# Patient Record
Sex: Male | Born: 1945 | Race: White | Hispanic: No | Marital: Single | Smoking: Former smoker
Health system: Southern US, Community
[De-identification: ages and names within clinical notes are randomized; demographics above are authoritative.]

## PROBLEM LIST (undated history)

## (undated) DIAGNOSIS — R351 Nocturia: Secondary | ICD-10-CM

## (undated) DIAGNOSIS — M545 Low back pain: Principal | ICD-10-CM

## (undated) DIAGNOSIS — Z5189 Encounter for other specified aftercare: Secondary | ICD-10-CM

## (undated) DIAGNOSIS — E785 Hyperlipidemia, unspecified: Secondary | ICD-10-CM

## (undated) DIAGNOSIS — F419 Anxiety disorder, unspecified: Secondary | ICD-10-CM

## (undated) DIAGNOSIS — F319 Bipolar disorder, unspecified: Secondary | ICD-10-CM

## (undated) DIAGNOSIS — I10 Essential (primary) hypertension: Secondary | ICD-10-CM

## (undated) DIAGNOSIS — Z889 Allergy status to unspecified drugs, medicaments and biological substances status: Secondary | ICD-10-CM

## (undated) DIAGNOSIS — K219 Gastro-esophageal reflux disease without esophagitis: Secondary | ICD-10-CM

## (undated) DIAGNOSIS — Z972 Presence of dental prosthetic device (complete) (partial): Secondary | ICD-10-CM

## (undated) DIAGNOSIS — Z8619 Personal history of other infectious and parasitic diseases: Secondary | ICD-10-CM

## (undated) DIAGNOSIS — R413 Other amnesia: Secondary | ICD-10-CM

## (undated) DIAGNOSIS — G4700 Insomnia, unspecified: Secondary | ICD-10-CM

## (undated) DIAGNOSIS — F32A Depression, unspecified: Secondary | ICD-10-CM

## (undated) DIAGNOSIS — G8929 Other chronic pain: Secondary | ICD-10-CM

## (undated) DIAGNOSIS — C61 Malignant neoplasm of prostate: Secondary | ICD-10-CM

## (undated) DIAGNOSIS — I499 Cardiac arrhythmia, unspecified: Secondary | ICD-10-CM

## (undated) DIAGNOSIS — M2550 Pain in unspecified joint: Secondary | ICD-10-CM

## (undated) DIAGNOSIS — I714 Abdominal aortic aneurysm, without rupture, unspecified: Secondary | ICD-10-CM

## (undated) DIAGNOSIS — M549 Dorsalgia, unspecified: Secondary | ICD-10-CM

## (undated) DIAGNOSIS — Z973 Presence of spectacles and contact lenses: Secondary | ICD-10-CM

## (undated) DIAGNOSIS — I509 Heart failure, unspecified: Secondary | ICD-10-CM

## (undated) DIAGNOSIS — M79606 Pain in leg, unspecified: Secondary | ICD-10-CM

## (undated) DIAGNOSIS — N400 Enlarged prostate without lower urinary tract symptoms: Secondary | ICD-10-CM

## (undated) DIAGNOSIS — R600 Localized edema: Secondary | ICD-10-CM

## (undated) DIAGNOSIS — M1990 Unspecified osteoarthritis, unspecified site: Secondary | ICD-10-CM

## (undated) DIAGNOSIS — I739 Peripheral vascular disease, unspecified: Secondary | ICD-10-CM

## (undated) DIAGNOSIS — IMO0001 Reserved for inherently not codable concepts without codable children: Secondary | ICD-10-CM

## (undated) DIAGNOSIS — F329 Major depressive disorder, single episode, unspecified: Secondary | ICD-10-CM

## (undated) DIAGNOSIS — R3915 Urgency of urination: Secondary | ICD-10-CM

## (undated) DIAGNOSIS — K08109 Complete loss of teeth, unspecified cause, unspecified class: Secondary | ICD-10-CM

## (undated) DIAGNOSIS — R609 Edema, unspecified: Secondary | ICD-10-CM

## (undated) DIAGNOSIS — F988 Other specified behavioral and emotional disorders with onset usually occurring in childhood and adolescence: Secondary | ICD-10-CM

## (undated) DIAGNOSIS — R011 Cardiac murmur, unspecified: Secondary | ICD-10-CM

## (undated) DIAGNOSIS — K635 Polyp of colon: Secondary | ICD-10-CM

## (undated) HISTORY — PX: TONSILLECTOMY: SUR1361

## (undated) HISTORY — DX: Abdominal aortic aneurysm, without rupture: I71.4

## (undated) HISTORY — DX: Abdominal aortic aneurysm, without rupture, unspecified: I71.40

## (undated) HISTORY — PX: FOOT SURGERY: SHX648

## (undated) HISTORY — PX: TESTICLE SURGERY: SHX794

## (undated) HISTORY — DX: Personal history of other infectious and parasitic diseases: Z86.19

## (undated) HISTORY — DX: Heart failure, unspecified: I50.9

## (undated) HISTORY — PX: CARDIAC CATHETERIZATION: SHX172

## (undated) HISTORY — PX: COLONOSCOPY: SHX174

## (undated) HISTORY — PX: TURP VAPORIZATION: SUR1397

## (undated) HISTORY — DX: Pain in unspecified joint: M25.50

## (undated) HISTORY — DX: Other chronic pain: G89.29

## (undated) HISTORY — PX: VASCULAR SURGERY: SHX849

## (undated) HISTORY — DX: Other specified behavioral and emotional disorders with onset usually occurring in childhood and adolescence: F98.8

## (undated) HISTORY — PX: BACK SURGERY: SHX140

## (undated) HISTORY — DX: Unspecified osteoarthritis, unspecified site: M19.90

## (undated) HISTORY — PX: SHOULDER SURGERY: SHX246

## (undated) HISTORY — DX: Anxiety disorder, unspecified: F41.9

## (undated) HISTORY — DX: Cardiac murmur, unspecified: R01.1

## (undated) HISTORY — DX: Major depressive disorder, single episode, unspecified: F32.9

## (undated) HISTORY — PX: CHOLECYSTECTOMY: SHX55

## (undated) HISTORY — DX: Depression, unspecified: F32.A

## (undated) HISTORY — DX: Pain in leg, unspecified: M79.606

## (undated) HISTORY — DX: Hyperlipidemia, unspecified: E78.5

## (undated) HISTORY — DX: Essential (primary) hypertension: I10

## (undated) HISTORY — DX: Low back pain: M54.5

---

## 2003-10-27 ENCOUNTER — Ambulatory Visit (HOSPITAL_BASED_OUTPATIENT_CLINIC_OR_DEPARTMENT_OTHER): Admission: RE | Admit: 2003-10-27 | Discharge: 2003-10-27 | Payer: Self-pay | Admitting: Orthopaedic Surgery

## 2003-10-27 ENCOUNTER — Ambulatory Visit (HOSPITAL_COMMUNITY): Admission: RE | Admit: 2003-10-27 | Discharge: 2003-10-27 | Payer: Self-pay | Admitting: Orthopaedic Surgery

## 2004-04-25 ENCOUNTER — Emergency Department (HOSPITAL_COMMUNITY): Admission: EM | Admit: 2004-04-25 | Discharge: 2004-04-25 | Payer: Self-pay | Admitting: Emergency Medicine

## 2005-03-03 ENCOUNTER — Ambulatory Visit (HOSPITAL_COMMUNITY): Admission: RE | Admit: 2005-03-03 | Discharge: 2005-03-03 | Payer: Self-pay | Admitting: Orthopedic Surgery

## 2005-09-08 ENCOUNTER — Ambulatory Visit (HOSPITAL_COMMUNITY): Admission: RE | Admit: 2005-09-08 | Discharge: 2005-09-08 | Payer: Self-pay | Admitting: Orthopaedic Surgery

## 2005-10-17 ENCOUNTER — Inpatient Hospital Stay (HOSPITAL_COMMUNITY): Admission: RE | Admit: 2005-10-17 | Discharge: 2005-10-21 | Payer: Self-pay | Admitting: Orthopedic Surgery

## 2006-09-08 ENCOUNTER — Encounter: Admission: RE | Admit: 2006-09-08 | Discharge: 2006-09-08 | Payer: Self-pay | Admitting: Orthopedic Surgery

## 2006-10-24 ENCOUNTER — Ambulatory Visit: Payer: Self-pay | Admitting: Vascular Surgery

## 2006-11-26 ENCOUNTER — Encounter
Admission: RE | Admit: 2006-11-26 | Discharge: 2007-02-24 | Payer: Self-pay | Admitting: Physical Medicine & Rehabilitation

## 2006-11-27 ENCOUNTER — Ambulatory Visit: Payer: Self-pay | Admitting: Physical Medicine & Rehabilitation

## 2006-12-31 ENCOUNTER — Ambulatory Visit: Payer: Self-pay | Admitting: Physical Medicine & Rehabilitation

## 2007-01-25 ENCOUNTER — Ambulatory Visit: Payer: Self-pay | Admitting: Physical Medicine & Rehabilitation

## 2007-02-22 ENCOUNTER — Encounter
Admission: RE | Admit: 2007-02-22 | Discharge: 2007-02-25 | Payer: Self-pay | Admitting: Physical Medicine & Rehabilitation

## 2007-03-14 HISTORY — PX: SPINAL CORD STIMULATOR IMPLANT: SHX2422

## 2007-06-17 ENCOUNTER — Ambulatory Visit: Payer: Self-pay | Admitting: Vascular Surgery

## 2007-08-26 ENCOUNTER — Encounter
Admission: RE | Admit: 2007-08-26 | Discharge: 2007-11-20 | Payer: Self-pay | Admitting: Physical Medicine & Rehabilitation

## 2007-08-27 ENCOUNTER — Ambulatory Visit: Payer: Self-pay | Admitting: Physical Medicine & Rehabilitation

## 2007-09-23 ENCOUNTER — Ambulatory Visit: Payer: Self-pay | Admitting: Physical Medicine & Rehabilitation

## 2007-10-28 ENCOUNTER — Ambulatory Visit: Payer: Self-pay | Admitting: Physical Medicine & Rehabilitation

## 2007-11-20 ENCOUNTER — Encounter
Admission: RE | Admit: 2007-11-20 | Discharge: 2007-11-21 | Payer: Self-pay | Admitting: Physical Medicine & Rehabilitation

## 2007-11-21 ENCOUNTER — Ambulatory Visit: Payer: Self-pay | Admitting: Physical Medicine & Rehabilitation

## 2007-12-18 ENCOUNTER — Ambulatory Visit: Payer: Self-pay | Admitting: Vascular Surgery

## 2008-01-16 ENCOUNTER — Ambulatory Visit: Payer: Self-pay | Admitting: Physical Medicine & Rehabilitation

## 2008-01-16 ENCOUNTER — Encounter
Admission: RE | Admit: 2008-01-16 | Discharge: 2008-04-15 | Payer: Self-pay | Admitting: Physical Medicine & Rehabilitation

## 2008-03-16 ENCOUNTER — Ambulatory Visit: Payer: Self-pay | Admitting: Physical Medicine & Rehabilitation

## 2008-04-14 ENCOUNTER — Ambulatory Visit: Payer: Self-pay | Admitting: Physical Medicine & Rehabilitation

## 2008-05-07 ENCOUNTER — Encounter
Admission: RE | Admit: 2008-05-07 | Discharge: 2008-08-05 | Payer: Self-pay | Admitting: Physical Medicine & Rehabilitation

## 2008-05-11 ENCOUNTER — Ambulatory Visit: Payer: Self-pay | Admitting: Physical Medicine & Rehabilitation

## 2008-06-15 ENCOUNTER — Ambulatory Visit: Payer: Self-pay | Admitting: Physical Medicine & Rehabilitation

## 2008-07-08 ENCOUNTER — Ambulatory Visit: Payer: Self-pay | Admitting: Vascular Surgery

## 2008-07-14 ENCOUNTER — Ambulatory Visit: Payer: Self-pay | Admitting: Physical Medicine & Rehabilitation

## 2008-12-28 ENCOUNTER — Ambulatory Visit (HOSPITAL_COMMUNITY): Admission: RE | Admit: 2008-12-28 | Discharge: 2008-12-28 | Payer: Self-pay | Admitting: Anesthesiology

## 2009-01-13 ENCOUNTER — Ambulatory Visit (HOSPITAL_COMMUNITY): Admission: RE | Admit: 2009-01-13 | Discharge: 2009-01-13 | Payer: Self-pay | Admitting: Orthopedic Surgery

## 2009-01-26 ENCOUNTER — Ambulatory Visit (HOSPITAL_COMMUNITY): Admission: RE | Admit: 2009-01-26 | Discharge: 2009-01-26 | Payer: Self-pay | Admitting: Orthopedic Surgery

## 2009-06-16 ENCOUNTER — Ambulatory Visit: Payer: Self-pay | Admitting: Vascular Surgery

## 2009-08-10 ENCOUNTER — Encounter: Admission: RE | Admit: 2009-08-10 | Discharge: 2009-08-10 | Payer: Self-pay | Admitting: Anesthesiology

## 2009-08-25 ENCOUNTER — Ambulatory Visit: Payer: Self-pay | Admitting: Vascular Surgery

## 2009-09-08 ENCOUNTER — Ambulatory Visit: Payer: Self-pay | Admitting: Vascular Surgery

## 2009-09-08 ENCOUNTER — Encounter: Admission: RE | Admit: 2009-09-08 | Discharge: 2009-09-08 | Payer: Self-pay | Admitting: Vascular Surgery

## 2009-09-29 ENCOUNTER — Ambulatory Visit: Payer: Self-pay | Admitting: Vascular Surgery

## 2009-10-20 ENCOUNTER — Ambulatory Visit: Payer: Self-pay | Admitting: Vascular Surgery

## 2009-11-11 HISTORY — PX: ABDOMINAL AORTIC ANEURYSM REPAIR: SHX42

## 2009-11-16 ENCOUNTER — Inpatient Hospital Stay (HOSPITAL_COMMUNITY): Admission: RE | Admit: 2009-11-16 | Discharge: 2009-11-17 | Payer: Self-pay | Admitting: Vascular Surgery

## 2009-11-16 ENCOUNTER — Ambulatory Visit: Payer: Self-pay | Admitting: Vascular Surgery

## 2009-11-19 ENCOUNTER — Ambulatory Visit: Payer: Self-pay | Admitting: Vascular Surgery

## 2009-12-08 ENCOUNTER — Encounter: Admission: RE | Admit: 2009-12-08 | Discharge: 2009-12-08 | Payer: Self-pay | Admitting: Vascular Surgery

## 2009-12-09 ENCOUNTER — Ambulatory Visit: Payer: Self-pay | Admitting: Vascular Surgery

## 2009-12-11 HISTORY — PX: SPINE SURGERY: SHX786

## 2009-12-14 ENCOUNTER — Inpatient Hospital Stay (HOSPITAL_COMMUNITY): Admission: RE | Admit: 2009-12-14 | Discharge: 2009-12-24 | Payer: Self-pay | Admitting: Orthopedic Surgery

## 2009-12-14 ENCOUNTER — Encounter (INDEPENDENT_AMBULATORY_CARE_PROVIDER_SITE_OTHER): Payer: Self-pay | Admitting: Orthopedic Surgery

## 2009-12-14 ENCOUNTER — Ambulatory Visit: Payer: Self-pay | Admitting: Pulmonary Disease

## 2010-04-02 ENCOUNTER — Other Ambulatory Visit: Payer: Self-pay | Admitting: Vascular Surgery

## 2010-04-02 DIAGNOSIS — I714 Abdominal aortic aneurysm, without rupture, unspecified: Secondary | ICD-10-CM

## 2010-05-04 DIAGNOSIS — M79606 Pain in leg, unspecified: Secondary | ICD-10-CM

## 2010-05-04 HISTORY — DX: Pain in leg, unspecified: M79.606

## 2010-05-05 ENCOUNTER — Other Ambulatory Visit: Payer: Self-pay

## 2010-05-05 ENCOUNTER — Ambulatory Visit (INDEPENDENT_AMBULATORY_CARE_PROVIDER_SITE_OTHER): Payer: Private Health Insurance - Indemnity | Admitting: Vascular Surgery

## 2010-05-05 ENCOUNTER — Ambulatory Visit
Admission: RE | Admit: 2010-05-05 | Discharge: 2010-05-05 | Disposition: A | Discharge status: Home / Self Care | Payer: Private Health Insurance - Indemnity | Source: Ambulatory Visit | Attending: Vascular Surgery | Admitting: Vascular Surgery

## 2010-05-05 DIAGNOSIS — I714 Abdominal aortic aneurysm, without rupture, unspecified: Secondary | ICD-10-CM

## 2010-05-05 MED ORDER — IOHEXOL 350 MG/ML SOLN
100.0000 mL | Freq: Once | INTRAVENOUS | Status: AC | PRN
  Administered 2010-05-05: 100 mL via INTRAVENOUS

## 2010-05-06 NOTE — Assessment & Plan Note (Signed)
OFFICE VISIT  Michael Lutz DOB:  18-Feb-1946                                       05/05/2010 ZOXWR#:[id number]  HISTORY OF PRESENT ILLNESS:  The patient is a 64 year old male who underwent endovascular stent graft repair of his aneurysm in September of 2011.  He returns today for further followup regarding this.  He denies any real changes in his back pain but states that he has chronic back pain.  He also underwent a back procedure by Dr. Alveda Reasons in October. He also stated today that he has intermittent abdominal pain but denies any nausea or vomiting.  He denies any fever.  He denies any association with any certain types of food whether it be spicy food or greasy food. He says the abdominal pain has been present for a month or so and is intermittent in nature but occurs almost on a daily basis.  REVIEW OF SYSTEMS:  He states that he has had some constipation recently. MUSCULOSKELETAL:  He has multiple joint and arthritis pain. PSYCHIATRIC:  He has mild anxiety and depression.  CHRONIC MEDICAL PROBLEMS:  Other chronic medical problems include hypertension and elevated cholesterol.  These are currently stable and followed by Dr. Eldridge Dace.  MEDICATIONS:  Include ranitidine, Effexor, hydrochlorothiazide, Seroquel, amlodipine, meclizine, quinapril, lamotrigine, Lidoderm patch, Sudafed, aspirin, Klonopin, Norflex, trazodone, nabumetone, ibuprofen, Exalgo ER, Lipitor and he takes several multivitamins.  He also is on OxyContin 80 mg three times a day and oxycodone 15 mg p.r.n.  He also recently was started on furosemide but did not know the dose of this.  ALLERGIES:  He has no known drug allergies.  PHYSICAL EXAM:  Vital signs:  Blood pressure is 121/77 in the left arm, heart rate 84 and regular, respirations 20.  HEENT:  Sclerae are anicteric.  Abdomen:  Has tenderness in the right upper quadrant with some guarding.  There is no mass in the abdomen.   There is no pulsatile mass in the abdomen.  He has 2+ femoral pulses and 2+ dorsalis pedis pulses bilaterally.  The left ankle is swollen which he states has been present since his back operation.  He had a CT scan of the abdomen and pelvis today which shows his stent graft is in good position with no evidence of type 1 proximal or distal endo leak.  However, he did have gallbladder wall thickening with a very distended gallbladder and some possible pericholecystic fluid.  This was suggestive of cholecystitis.  The CT scan was discussed with Dr. Audie Pinto.  His abdominal aortic aneurysm was 3.9 cm in diameter and stable.  In summary, the patient is doing well from his endovascular stent graft repair.  He has chronic swelling of the left lower extremity which has been present since his back operation.  Apparently Dr. Eldridge Dace had ordered a duplex ultrasound of his left lower extremity in the near future.  The patient also has followup scheduled in the near future with Dr. Alveda Reasons regarding this.  As far as his gallbladder is concerned he did have findings on the CT scan suggestive of cholecystitis and has some right upper quadrant pain. In light of this we have scheduled him for an appointment with [facility].  I spoke with our scheduling people today but the patient needed to check his schedule at home to find out an appropriate appointment  time.  He will follow up with them regarding this.  He will follow up with me in 6 months' time for an aortic ultrasound to intermittently check his aneurysm stent graft.    Michael Hora. [name], MD Electronically Signed  CEF/MEDQ  D:  05/05/2010  T:  05/06/2010  Job:  [id number]  cc:   Corky Crafts, MD Nelda Severe, MD [facility]

## 2010-05-11 DIAGNOSIS — Z48812 Encounter for surgical aftercare following surgery on the circulatory system: Secondary | ICD-10-CM

## 2010-05-25 LAB — PREPARE FRESH FROZEN PLASMA

## 2010-05-25 LAB — BASIC METABOLIC PANEL
BUN: 11 mg/dL (ref 6–23)
BUN: 16 mg/dL (ref 6–23)
BUN: 17 mg/dL (ref 6–23)
BUN: 3 mg/dL — ABNORMAL LOW (ref 6–23)
BUN: 4 mg/dL — ABNORMAL LOW (ref 6–23)
BUN: 6 mg/dL (ref 6–23)
BUN: 7 mg/dL (ref 6–23)
BUN: 7 mg/dL (ref 6–23)
BUN: 7 mg/dL (ref 6–23)
BUN: 8 mg/dL (ref 6–23)
BUN: 9 mg/dL (ref 6–23)
CO2: 25 mEq/L (ref 19–32)
CO2: 26 mEq/L (ref 19–32)
CO2: 26 mEq/L (ref 19–32)
CO2: 27 mEq/L (ref 19–32)
CO2: 27 mEq/L (ref 19–32)
CO2: 28 mEq/L (ref 19–32)
CO2: 29 mEq/L (ref 19–32)
CO2: 30 mEq/L (ref 19–32)
CO2: 31 mEq/L (ref 19–32)
CO2: 31 mEq/L (ref 19–32)
CO2: 33 mEq/L — ABNORMAL HIGH (ref 19–32)
Calcium: 7 mg/dL — ABNORMAL LOW (ref 8.4–10.5)
Calcium: 7.1 mg/dL — ABNORMAL LOW (ref 8.4–10.5)
Calcium: 7.2 mg/dL — ABNORMAL LOW (ref 8.4–10.5)
Calcium: 7.4 mg/dL — ABNORMAL LOW (ref 8.4–10.5)
Calcium: 8 mg/dL — ABNORMAL LOW (ref 8.4–10.5)
Calcium: 8.3 mg/dL — ABNORMAL LOW (ref 8.4–10.5)
Calcium: 8.4 mg/dL (ref 8.4–10.5)
Calcium: 8.8 mg/dL (ref 8.4–10.5)
Calcium: 8.8 mg/dL (ref 8.4–10.5)
Calcium: 8.9 mg/dL (ref 8.4–10.5)
Calcium: 9.1 mg/dL (ref 8.4–10.5)
Chloride: 100 mEq/L (ref 96–112)
Chloride: 101 mEq/L (ref 96–112)
Chloride: 102 mEq/L (ref 96–112)
Chloride: 103 mEq/L (ref 96–112)
Chloride: 104 mEq/L (ref 96–112)
Chloride: 105 mEq/L (ref 96–112)
Chloride: 106 mEq/L (ref 96–112)
Chloride: 106 mEq/L (ref 96–112)
Chloride: 107 mEq/L (ref 96–112)
Chloride: 108 mEq/L (ref 96–112)
Chloride: 110 mEq/L (ref 96–112)
Creatinine, Ser: 0.64 mg/dL (ref 0.4–1.5)
Creatinine, Ser: 0.66 mg/dL (ref 0.4–1.5)
Creatinine, Ser: 0.67 mg/dL (ref 0.4–1.5)
Creatinine, Ser: 0.7 mg/dL (ref 0.4–1.5)
Creatinine, Ser: 0.72 mg/dL (ref 0.4–1.5)
Creatinine, Ser: 0.73 mg/dL (ref 0.4–1.5)
Creatinine, Ser: 0.78 mg/dL (ref 0.4–1.5)
Creatinine, Ser: 0.83 mg/dL (ref 0.4–1.5)
Creatinine, Ser: 0.97 mg/dL (ref 0.4–1.5)
Creatinine, Ser: 0.98 mg/dL (ref 0.4–1.5)
Creatinine, Ser: 1.02 mg/dL (ref 0.4–1.5)
GFR calc Af Amer: >60 mL/min (ref >60)
GFR calc Af Amer: >60 mL/min (ref >60)
GFR calc Af Amer: >60 mL/min (ref >60)
GFR calc Af Amer: >60 mL/min (ref >60)
GFR calc Af Amer: >60 mL/min (ref >60)
GFR calc Af Amer: >60 mL/min (ref >60)
GFR calc Af Amer: >60 mL/min (ref >60)
GFR calc Af Amer: >60 mL/min (ref >60)
GFR calc Af Amer: >60 mL/min (ref >60)
GFR calc Af Amer: >60 mL/min (ref >60)
GFR calc Af Amer: >60 mL/min (ref >60)
GFR calc non Af Amer: >60 mL/min (ref >60)
GFR calc non Af Amer: >60 mL/min (ref >60)
GFR calc non Af Amer: >60 mL/min (ref >60)
GFR calc non Af Amer: >60 mL/min (ref >60)
GFR calc non Af Amer: >60 mL/min (ref >60)
GFR calc non Af Amer: >60 mL/min (ref >60)
GFR calc non Af Amer: >60 mL/min (ref >60)
GFR calc non Af Amer: >60 mL/min (ref >60)
GFR calc non Af Amer: >60 mL/min (ref >60)
GFR calc non Af Amer: >60 mL/min (ref >60)
GFR calc non Af Amer: >60 mL/min (ref >60)
Glucose, Bld: 101 mg/dL — ABNORMAL HIGH (ref 70–99)
Glucose, Bld: 103 mg/dL — ABNORMAL HIGH (ref 70–99)
Glucose, Bld: 103 mg/dL — ABNORMAL HIGH (ref 70–99)
Glucose, Bld: 104 mg/dL — ABNORMAL HIGH (ref 70–99)
Glucose, Bld: 106 mg/dL — ABNORMAL HIGH (ref 70–99)
Glucose, Bld: 111 mg/dL — ABNORMAL HIGH (ref 70–99)
Glucose, Bld: 112 mg/dL — ABNORMAL HIGH (ref 70–99)
Glucose, Bld: 112 mg/dL — ABNORMAL HIGH (ref 70–99)
Glucose, Bld: 138 mg/dL — ABNORMAL HIGH (ref 70–99)
Glucose, Bld: 174 mg/dL — ABNORMAL HIGH (ref 70–99)
Glucose, Bld: 95 mg/dL (ref 70–99)
Potassium: 3.1 mEq/L — ABNORMAL LOW (ref 3.5–5.1)
Potassium: 3.4 mEq/L — ABNORMAL LOW (ref 3.5–5.1)
Potassium: 3.5 mEq/L (ref 3.5–5.1)
Potassium: 3.6 mEq/L (ref 3.5–5.1)
Potassium: 3.7 mEq/L (ref 3.5–5.1)
Potassium: 3.8 mEq/L (ref 3.5–5.1)
Potassium: 3.8 mEq/L (ref 3.5–5.1)
Potassium: 3.9 mEq/L (ref 3.5–5.1)
Potassium: 3.9 mEq/L (ref 3.5–5.1)
Potassium: 4.1 mEq/L (ref 3.5–5.1)
Potassium: 5.1 mEq/L (ref 3.5–5.1)
Sodium: 134 mEq/L — ABNORMAL LOW (ref 135–145)
Sodium: 135 mEq/L (ref 135–145)
Sodium: 136 mEq/L (ref 135–145)
Sodium: 136 mEq/L (ref 135–145)
Sodium: 137 mEq/L (ref 135–145)
Sodium: 137 mEq/L (ref 135–145)
Sodium: 138 mEq/L (ref 135–145)
Sodium: 138 mEq/L (ref 135–145)
Sodium: 140 mEq/L (ref 135–145)
Sodium: 140 mEq/L (ref 135–145)
Sodium: 141 mEq/L (ref 135–145)

## 2010-05-25 LAB — POCT I-STAT 4, (NA,K, GLUC, HGB,HCT)
Glucose, Bld: 108 mg/dL — ABNORMAL HIGH (ref 70–99)
Glucose, Bld: 114 mg/dL — ABNORMAL HIGH (ref 70–99)
Glucose, Bld: 130 mg/dL — ABNORMAL HIGH (ref 70–99)
HCT: 31 % — ABNORMAL LOW (ref 39.0–52.0)
HCT: 33 % — ABNORMAL LOW (ref 39.0–52.0)
HCT: 33 % — ABNORMAL LOW (ref 39.0–52.0)
Hemoglobin: 10.5 g/dL — ABNORMAL LOW (ref 13.0–17.0)
Hemoglobin: 11.2 g/dL — ABNORMAL LOW (ref 13.0–17.0)
Hemoglobin: 11.2 g/dL — ABNORMAL LOW (ref 13.0–17.0)
Potassium: 3.9 mEq/L (ref 3.5–5.1)
Potassium: 4 mEq/L (ref 3.5–5.1)
Potassium: 4 mEq/L (ref 3.5–5.1)
Sodium: 135 mEq/L (ref 135–145)
Sodium: 136 mEq/L (ref 135–145)
Sodium: 138 mEq/L (ref 135–145)

## 2010-05-25 LAB — CBC
HCT: 24.1 % — ABNORMAL LOW (ref 39.0–52.0)
HCT: 24.3 % — ABNORMAL LOW (ref 39.0–52.0)
HCT: 24.4 % — ABNORMAL LOW (ref 39.0–52.0)
HCT: 25.1 % — ABNORMAL LOW (ref 39.0–52.0)
HCT: 26.2 % — ABNORMAL LOW (ref 39.0–52.0)
HCT: 27.4 % — ABNORMAL LOW (ref 39.0–52.0)
HCT: 27.4 % — ABNORMAL LOW (ref 39.0–52.0)
HCT: 28.5 % — ABNORMAL LOW (ref 39.0–52.0)
HCT: 29.3 % — ABNORMAL LOW (ref 39.0–52.0)
HCT: 31.3 % — ABNORMAL LOW (ref 39.0–52.0)
HCT: 31.6 % — ABNORMAL LOW (ref 39.0–52.0)
HCT: 33.9 % — ABNORMAL LOW (ref 39.0–52.0)
Hemoglobin: 10 g/dL — ABNORMAL LOW (ref 13.0–17.0)
Hemoglobin: 10.5 g/dL — ABNORMAL LOW (ref 13.0–17.0)
Hemoglobin: 10.6 g/dL — ABNORMAL LOW (ref 13.0–17.0)
Hemoglobin: 11.4 g/dL — ABNORMAL LOW (ref 13.0–17.0)
Hemoglobin: 8.2 g/dL — ABNORMAL LOW (ref 13.0–17.0)
Hemoglobin: 8.3 g/dL — ABNORMAL LOW (ref 13.0–17.0)
Hemoglobin: 8.3 g/dL — ABNORMAL LOW (ref 13.0–17.0)
Hemoglobin: 8.5 g/dL — ABNORMAL LOW (ref 13.0–17.0)
Hemoglobin: 8.9 g/dL — ABNORMAL LOW (ref 13.0–17.0)
Hemoglobin: 9 g/dL — ABNORMAL LOW (ref 13.0–17.0)
Hemoglobin: 9.3 g/dL — ABNORMAL LOW (ref 13.0–17.0)
Hemoglobin: 9.3 g/dL — ABNORMAL LOW (ref 13.0–17.0)
MCH: 29.6 pg (ref 26.0–34.0)
MCH: 29.9 pg (ref 26.0–34.0)
MCH: 30.2 pg (ref 26.0–34.0)
MCH: 30.2 pg (ref 26.0–34.0)
MCH: 30.4 pg (ref 26.0–34.0)
MCH: 30.5 pg (ref 26.0–34.0)
MCH: 30.6 pg (ref 26.0–34.0)
MCH: 30.7 pg (ref 26.0–34.0)
MCH: 30.7 pg (ref 26.0–34.0)
MCH: 30.8 pg (ref 26.0–34.0)
MCH: 31.3 pg (ref 26.0–34.0)
MCH: 31.3 pg (ref 26.0–34.0)
MCHC: 32.6 g/dL (ref 30.0–36.0)
MCHC: 32.8 g/dL (ref 30.0–36.0)
MCHC: 33.5 g/dL (ref 30.0–36.0)
MCHC: 33.5 g/dL (ref 30.0–36.0)
MCHC: 33.6 g/dL (ref 30.0–36.0)
MCHC: 33.7 g/dL (ref 30.0–36.0)
MCHC: 33.9 g/dL (ref 30.0–36.0)
MCHC: 33.9 g/dL (ref 30.0–36.0)
MCHC: 34 g/dL (ref 30.0–36.0)
MCHC: 34 g/dL (ref 30.0–36.0)
MCHC: 34.1 g/dL (ref 30.0–36.0)
MCHC: 34.4 g/dL (ref 30.0–36.0)
MCV: 88.3 fL (ref 78.0–100.0)
MCV: 88.7 fL (ref 78.0–100.0)
MCV: 89.8 fL (ref 78.0–100.0)
MCV: 89.9 fL (ref 78.0–100.0)
MCV: 89.9 fL (ref 78.0–100.0)
MCV: 90.2 fL (ref 78.0–100.0)
MCV: 90.3 fL (ref 78.0–100.0)
MCV: 90.4 fL (ref 78.0–100.0)
MCV: 90.9 fL (ref 78.0–100.0)
MCV: 92.3 fL (ref 78.0–100.0)
MCV: 93.1 fL (ref 78.0–100.0)
MCV: 93.5 fL (ref 78.0–100.0)
Platelets: 113 10*3/uL — ABNORMAL LOW (ref 150–400)
Platelets: 138 10*3/uL — ABNORMAL LOW (ref 150–400)
Platelets: 180 10*3/uL (ref 150–400)
Platelets: 290 10*3/uL (ref 150–400)
Platelets: 339 10*3/uL (ref 150–400)
Platelets: 72 10*3/uL — ABNORMAL LOW (ref 150–400)
Platelets: 72 10*3/uL — ABNORMAL LOW (ref 150–400)
Platelets: 74 10*3/uL — ABNORMAL LOW (ref 150–400)
Platelets: 77 10*3/uL — ABNORMAL LOW (ref 150–400)
Platelets: 81 10*3/uL — ABNORMAL LOW (ref 150–400)
Platelets: 87 10*3/uL — ABNORMAL LOW (ref 150–400)
Platelets: 88 10*3/uL — ABNORMAL LOW (ref 150–400)
RBC: 2.65 MIL/uL — ABNORMAL LOW (ref 4.22–5.81)
RBC: 2.7 MIL/uL — ABNORMAL LOW (ref 4.22–5.81)
RBC: 2.74 MIL/uL — ABNORMAL LOW (ref 4.22–5.81)
RBC: 2.78 MIL/uL — ABNORMAL LOW (ref 4.22–5.81)
RBC: 2.84 MIL/uL — ABNORMAL LOW (ref 4.22–5.81)
RBC: 2.93 MIL/uL — ABNORMAL LOW (ref 4.22–5.81)
RBC: 3.05 MIL/uL — ABNORMAL LOW (ref 4.22–5.81)
RBC: 3.06 MIL/uL — ABNORMAL LOW (ref 4.22–5.81)
RBC: 3.25 MIL/uL — ABNORMAL LOW (ref 4.22–5.81)
RBC: 3.48 MIL/uL — ABNORMAL LOW (ref 4.22–5.81)
RBC: 3.58 MIL/uL — ABNORMAL LOW (ref 4.22–5.81)
RBC: 3.77 MIL/uL — ABNORMAL LOW (ref 4.22–5.81)
RDW: 14.5 % (ref 11.5–15.5)
RDW: 14.5 % (ref 11.5–15.5)
RDW: 14.5 % (ref 11.5–15.5)
RDW: 14.6 % (ref 11.5–15.5)
RDW: 14.6 % (ref 11.5–15.5)
RDW: 14.6 % (ref 11.5–15.5)
RDW: 14.7 % (ref 11.5–15.5)
RDW: 14.7 % (ref 11.5–15.5)
RDW: 14.7 % (ref 11.5–15.5)
RDW: 14.8 % (ref 11.5–15.5)
RDW: 15.2 % (ref 11.5–15.5)
RDW: 15.4 % (ref 11.5–15.5)
WBC: 10.1 10*3/uL (ref 4.0–10.5)
WBC: 10.3 10*3/uL (ref 4.0–10.5)
WBC: 12.8 10*3/uL — ABNORMAL HIGH (ref 4.0–10.5)
WBC: 16.4 10*3/uL — ABNORMAL HIGH (ref 4.0–10.5)
WBC: 6.3 10*3/uL (ref 4.0–10.5)
WBC: 6.9 10*3/uL (ref 4.0–10.5)
WBC: 7.1 10*3/uL (ref 4.0–10.5)
WBC: 7.2 10*3/uL (ref 4.0–10.5)
WBC: 7.5 10*3/uL (ref 4.0–10.5)
WBC: 8.1 10*3/uL (ref 4.0–10.5)
WBC: 8.8 10*3/uL (ref 4.0–10.5)
WBC: 9.1 10*3/uL (ref 4.0–10.5)

## 2010-05-25 LAB — POCT I-STAT 7, (LYTES, BLD GAS, ICA,H+H)
Acid-Base Excess: 3 mmol/L — ABNORMAL HIGH (ref 0.0–2.0)
Acid-Base Excess: 3 mmol/L — ABNORMAL HIGH (ref 0.0–2.0)
Bicarbonate: 28.3 mEq/L — ABNORMAL HIGH (ref 20.0–24.0)
Bicarbonate: 28.6 mEq/L — ABNORMAL HIGH (ref 20.0–24.0)
Calcium, Ion: 1.09 mmol/L — ABNORMAL LOW (ref 1.12–1.32)
Calcium, Ion: 1.26 mmol/L (ref 1.12–1.32)
HCT: 28 % — ABNORMAL LOW (ref 39.0–52.0)
HCT: 40 % (ref 39.0–52.0)
Hemoglobin: 13.6 g/dL (ref 13.0–17.0)
Hemoglobin: 9.5 g/dL — ABNORMAL LOW (ref 13.0–17.0)
O2 Saturation: 100 %
O2 Saturation: 100 %
Patient temperature: 35.3
Patient temperature: 36
Potassium: 3.7 mEq/L (ref 3.5–5.1)
Potassium: 3.9 mEq/L (ref 3.5–5.1)
Sodium: 137 mEq/L (ref 135–145)
Sodium: 137 mEq/L (ref 135–145)
TCO2: 30 mmol/L (ref 0–100)
TCO2: 30 mmol/L (ref 0–100)
pCO2 arterial: 41.7 mmHg (ref 35.0–45.0)
pCO2 arterial: 43.7 mmHg (ref 35.0–45.0)
pH, Arterial: 7.419 (ref 7.350–7.450)
pH, Arterial: 7.433 (ref 7.350–7.450)
pO2, Arterial: 364 mmHg — ABNORMAL HIGH (ref 80.0–100.0)
pO2, Arterial: 447 mmHg — ABNORMAL HIGH (ref 80.0–100.0)

## 2010-05-25 LAB — PROTIME-INR
INR: 0.99 (ref 0.00–1.49)
INR: 0.99 (ref 0.00–1.49)
INR: 0.99 (ref 0.00–1.49)
INR: 1 (ref 0.00–1.49)
INR: 1.02 (ref 0.00–1.49)
INR: 1.02 (ref 0.00–1.49)
INR: 1.02 (ref 0.00–1.49)
INR: 1.15 (ref 0.00–1.49)
INR: 1.2 (ref 0.00–1.49)
INR: 1.22 (ref 0.00–1.49)
INR: 1.23 (ref 0.00–1.49)
Prothrombin Time: 13.3 seconds (ref 11.6–15.2)
Prothrombin Time: 13.3 seconds (ref 11.6–15.2)
Prothrombin Time: 13.3 seconds (ref 11.6–15.2)
Prothrombin Time: 13.4 seconds (ref 11.6–15.2)
Prothrombin Time: 13.6 seconds (ref 11.6–15.2)
Prothrombin Time: 13.6 seconds (ref 11.6–15.2)
Prothrombin Time: 13.6 seconds (ref 11.6–15.2)
Prothrombin Time: 14.9 seconds (ref 11.6–15.2)
Prothrombin Time: 15.4 seconds — ABNORMAL HIGH (ref 11.6–15.2)
Prothrombin Time: 15.6 seconds — ABNORMAL HIGH (ref 11.6–15.2)
Prothrombin Time: 15.7 seconds — ABNORMAL HIGH (ref 11.6–15.2)

## 2010-05-25 LAB — PREPARE RBC (CROSSMATCH)

## 2010-05-25 LAB — PREPARE PLATELETS

## 2010-05-25 LAB — MRSA PCR SCREENING: MRSA by PCR: NEGATIVE

## 2010-05-25 LAB — DIC (DISSEMINATED INTRAVASCULAR COAGULATION)PANEL
D-Dimer, Quant: 4.74 ug/mL-FEU — ABNORMAL HIGH (ref 0.00–0.48)
Fibrinogen: 272 mg/dL (ref 204–475)
INR: 1.19 (ref 0.00–1.49)
Prothrombin Time: 15.3 seconds — ABNORMAL HIGH (ref 11.6–15.2)
Smear Review: NONE SEEN
aPTT: 31 seconds (ref 24–37)

## 2010-05-25 LAB — MAGNESIUM: Magnesium: 1.6 mg/dL (ref 1.5–2.5)

## 2010-05-25 LAB — PHOSPHORUS: Phosphorus: 1.6 mg/dL — ABNORMAL LOW (ref 2.3–4.6)

## 2010-05-25 LAB — APTT
aPTT: 33 seconds (ref 24–37)
aPTT: 34 seconds (ref 24–37)
aPTT: 37 seconds (ref 24–37)

## 2010-05-25 LAB — DIC (DISSEMINATED INTRAVASCULAR COAGULATION) PANEL (NOT AT ARMC): Platelets: 87 10*3/uL — ABNORMAL LOW (ref 150–400)

## 2010-05-26 LAB — TYPE AND SCREEN
ABO/RH(D): O POS
ABO/RH(D): O POS
Antibody Screen: NEGATIVE
Antibody Screen: NEGATIVE

## 2010-05-26 LAB — COMPREHENSIVE METABOLIC PANEL
ALT: 15 U/L (ref 0–53)
ALT: 18 U/L (ref 0–53)
ALT: 18 U/L (ref 0–53)
AST: 21 U/L (ref 0–37)
AST: 23 U/L (ref 0–37)
AST: 24 U/L (ref 0–37)
Albumin: 2.9 g/dL — ABNORMAL LOW (ref 3.5–5.2)
Albumin: 3.9 g/dL (ref 3.5–5.2)
Albumin: 3.9 g/dL (ref 3.5–5.2)
Alkaline Phosphatase: 55 U/L (ref 39–117)
Alkaline Phosphatase: 72 U/L (ref 39–117)
Alkaline Phosphatase: 77 U/L (ref 39–117)
BUN: 10 mg/dL (ref 6–23)
BUN: 12 mg/dL (ref 6–23)
BUN: 15 mg/dL (ref 6–23)
CO2: 26 mEq/L (ref 19–32)
CO2: 28 mEq/L (ref 19–32)
CO2: 30 mEq/L (ref 19–32)
Calcium: 8.6 mg/dL (ref 8.4–10.5)
Calcium: 9.2 mg/dL (ref 8.4–10.5)
Calcium: 9.6 mg/dL (ref 8.4–10.5)
Chloride: 102 mEq/L (ref 96–112)
Chloride: 103 mEq/L (ref 96–112)
Chloride: 106 mEq/L (ref 96–112)
Creatinine, Ser: 0.83 mg/dL (ref 0.4–1.5)
Creatinine, Ser: 0.84 mg/dL (ref 0.4–1.5)
Creatinine, Ser: 0.88 mg/dL (ref 0.4–1.5)
GFR calc Af Amer: >60 mL/min (ref >60)
GFR calc Af Amer: >60 mL/min (ref >60)
GFR calc Af Amer: >60 mL/min (ref >60)
GFR calc non Af Amer: >60 mL/min (ref >60)
GFR calc non Af Amer: >60 mL/min (ref >60)
GFR calc non Af Amer: >60 mL/min (ref >60)
Glucose, Bld: 87 mg/dL (ref 70–99)
Glucose, Bld: 89 mg/dL (ref 70–99)
Glucose, Bld: 89 mg/dL (ref 70–99)
Potassium: 3.7 mEq/L (ref 3.5–5.1)
Potassium: 4.4 mEq/L (ref 3.5–5.1)
Potassium: 4.4 mEq/L (ref 3.5–5.1)
Sodium: 136 mEq/L (ref 135–145)
Sodium: 137 mEq/L (ref 135–145)
Sodium: 137 mEq/L (ref 135–145)
Total Bilirubin: 0.5 mg/dL (ref 0.3–1.2)
Total Bilirubin: 0.5 mg/dL (ref 0.3–1.2)
Total Bilirubin: 0.5 mg/dL (ref 0.3–1.2)
Total Protein: 5.3 g/dL — ABNORMAL LOW (ref 6.0–8.3)
Total Protein: 6.3 g/dL (ref 6.0–8.3)
Total Protein: 6.6 g/dL (ref 6.0–8.3)

## 2010-05-26 LAB — BLOOD GAS, ARTERIAL
Acid-Base Excess: 2.3 mmol/L — ABNORMAL HIGH (ref 0.0–2.0)
Bicarbonate: 25.9 mEq/L — ABNORMAL HIGH (ref 20.0–24.0)
Drawn by: 206361
FIO2: 0.21 %
O2 Saturation: 97.2 %
Patient temperature: 98.6
TCO2: 27.1 mmol/L (ref 0–100)
pCO2 arterial: 37.9 mmHg (ref 35.0–45.0)
pH, Arterial: 7.45 (ref 7.350–7.450)
pO2, Arterial: 80 mmHg (ref 80.0–100.0)

## 2010-05-26 LAB — CBC
HCT: 40.7 % (ref 39.0–52.0)
HCT: 41.6 % (ref 39.0–52.0)
HCT: 44.9 % (ref 39.0–52.0)
HCT: 46.2 % (ref 39.0–52.0)
Hemoglobin: 13.6 g/dL (ref 13.0–17.0)
Hemoglobin: 14.1 g/dL (ref 13.0–17.0)
Hemoglobin: 15.1 g/dL (ref 13.0–17.0)
Hemoglobin: 16.2 g/dL (ref 13.0–17.0)
MCH: 29.3 pg (ref 26.0–34.0)
MCH: 29.8 pg (ref 26.0–34.0)
MCH: 30.4 pg (ref 26.0–34.0)
MCH: 30.8 pg (ref 26.0–34.0)
MCHC: 33.4 g/dL (ref 30.0–36.0)
MCHC: 33.6 g/dL (ref 30.0–36.0)
MCHC: 33.9 g/dL (ref 30.0–36.0)
MCHC: 35.1 g/dL (ref 30.0–36.0)
MCV: 87.7 fL (ref 78.0–100.0)
MCV: 87.8 fL (ref 78.0–100.0)
MCV: 87.9 fL (ref 78.0–100.0)
MCV: 90.5 fL (ref 78.0–100.0)
Platelets: 124 10*3/uL — ABNORMAL LOW (ref 150–400)
Platelets: 133 10*3/uL — ABNORMAL LOW (ref 150–400)
Platelets: 136 10*3/uL — ABNORMAL LOW (ref 150–400)
Platelets: 140 10*3/uL — ABNORMAL LOW (ref 150–400)
RBC: 4.64 MIL/uL (ref 4.22–5.81)
RBC: 4.73 MIL/uL (ref 4.22–5.81)
RBC: 4.96 MIL/uL (ref 4.22–5.81)
RBC: 5.26 MIL/uL (ref 4.22–5.81)
RDW: 14.3 % (ref 11.5–15.5)
RDW: 14.5 % (ref 11.5–15.5)
RDW: 14.5 % (ref 11.5–15.5)
RDW: 14.9 % (ref 11.5–15.5)
WBC: 11.1 10*3/uL — ABNORMAL HIGH (ref 4.0–10.5)
WBC: 12.1 10*3/uL — ABNORMAL HIGH (ref 4.0–10.5)
WBC: 8.5 10*3/uL (ref 4.0–10.5)
WBC: 8.5 10*3/uL (ref 4.0–10.5)

## 2010-05-26 LAB — URINALYSIS, ROUTINE W REFLEX MICROSCOPIC
Bilirubin Urine: NEGATIVE
Bilirubin Urine: NEGATIVE
Glucose, UA: NEGATIVE mg/dL
Glucose, UA: NEGATIVE mg/dL
Hgb urine dipstick: NEGATIVE
Hgb urine dipstick: NEGATIVE
Ketones, ur: 15 mg/dL — AB
Ketones, ur: NEGATIVE mg/dL
Nitrite: NEGATIVE
Nitrite: NEGATIVE
Protein, ur: NEGATIVE mg/dL
Protein, ur: NEGATIVE mg/dL
Specific Gravity, Urine: 1.009 (ref 1.005–1.030)
Specific Gravity, Urine: 1.023 (ref 1.005–1.030)
Urobilinogen, UA: 0.2 mg/dL (ref 0.0–1.0)
Urobilinogen, UA: 0.2 mg/dL (ref 0.0–1.0)
pH: 6 (ref 5.0–8.0)
pH: 6.5 (ref 5.0–8.0)

## 2010-05-26 LAB — DIFFERENTIAL
Basophils Absolute: 0 10*3/uL (ref 0.0–0.1)
Basophils Relative: 0 % (ref 0–1)
Eosinophils Absolute: 0.1 10*3/uL (ref 0.0–0.7)
Eosinophils Relative: 2 % (ref 0–5)
Lymphocytes Relative: 19 % (ref 12–46)
Lymphs Abs: 1.6 10*3/uL (ref 0.7–4.0)
Monocytes Absolute: 0.4 10*3/uL (ref 0.1–1.0)
Monocytes Relative: 5 % (ref 3–12)
Neutro Abs: 6.4 10*3/uL (ref 1.7–7.7)
Neutrophils Relative %: 75 % (ref 43–77)

## 2010-05-26 LAB — MAGNESIUM
Magnesium: 1.6 mg/dL (ref 1.5–2.5)
Magnesium: 1.7 mg/dL (ref 1.5–2.5)

## 2010-05-26 LAB — BASIC METABOLIC PANEL
BUN: 15 mg/dL (ref 6–23)
CO2: 27 mEq/L (ref 19–32)
Calcium: 9.1 mg/dL (ref 8.4–10.5)
Chloride: 106 mEq/L (ref 96–112)
Creatinine, Ser: 0.83 mg/dL (ref 0.4–1.5)
GFR calc Af Amer: >60 mL/min (ref >60)
GFR calc non Af Amer: >60 mL/min (ref >60)
Glucose, Bld: 102 mg/dL — ABNORMAL HIGH (ref 70–99)
Potassium: 3.7 mEq/L (ref 3.5–5.1)
Sodium: 137 mEq/L (ref 135–145)

## 2010-05-26 LAB — URINE MICROSCOPIC-ADD ON

## 2010-05-26 LAB — PROTIME-INR
INR: 0.92 (ref 0.00–1.49)
INR: 0.92 (ref 0.00–1.49)
INR: 1.07 (ref 0.00–1.49)
Prothrombin Time: 12.6 seconds (ref 11.6–15.2)
Prothrombin Time: 12.6 seconds (ref 11.6–15.2)
Prothrombin Time: 14.1 seconds (ref 11.6–15.2)

## 2010-05-26 LAB — SURGICAL PCR SCREEN
MRSA, PCR: NEGATIVE
MRSA, PCR: NEGATIVE
Staphylococcus aureus: POSITIVE — AB
Staphylococcus aureus: POSITIVE — AB

## 2010-05-26 LAB — AMYLASE: Amylase: 53 U/L (ref 0–105)

## 2010-05-26 LAB — APTT
aPTT: 34 seconds (ref 24–37)
aPTT: 35 seconds (ref 24–37)
aPTT: 37 seconds (ref 24–37)

## 2010-05-31 ENCOUNTER — Encounter (HOSPITAL_COMMUNITY)
Admission: RE | Admit: 2010-05-31 | Discharge: 2010-05-31 | Disposition: A | Discharge status: Home / Self Care | Payer: Managed Care, Other (non HMO) | Source: Ambulatory Visit | Attending: Surgery | Admitting: Surgery

## 2010-05-31 ENCOUNTER — Other Ambulatory Visit (HOSPITAL_COMMUNITY): Payer: Self-pay | Admitting: Surgery

## 2010-05-31 ENCOUNTER — Ambulatory Visit (HOSPITAL_COMMUNITY)
Admission: RE | Admit: 2010-05-31 | Discharge: 2010-05-31 | Disposition: A | Discharge status: Home / Self Care | Payer: Managed Care, Other (non HMO) | Source: Ambulatory Visit | Attending: Surgery | Admitting: Surgery

## 2010-05-31 DIAGNOSIS — Z01812 Encounter for preprocedural laboratory examination: Secondary | ICD-10-CM | POA: Insufficient documentation

## 2010-05-31 DIAGNOSIS — Z01811 Encounter for preprocedural respiratory examination: Secondary | ICD-10-CM | POA: Insufficient documentation

## 2010-05-31 DIAGNOSIS — Z01810 Encounter for preprocedural cardiovascular examination: Secondary | ICD-10-CM | POA: Insufficient documentation

## 2010-05-31 LAB — COMPREHENSIVE METABOLIC PANEL
ALT: 12 U/L (ref 0–53)
AST: 14 U/L (ref 0–37)
Albumin: 3.6 g/dL (ref 3.5–5.2)
Alkaline Phosphatase: 81 U/L (ref 39–117)
BUN: 15 mg/dL (ref 6–23)
CO2: 31 mEq/L (ref 19–32)
Calcium: 9.4 mg/dL (ref 8.4–10.5)
Chloride: 103 mEq/L (ref 96–112)
Creatinine, Ser: 1.22 mg/dL (ref 0.4–1.5)
GFR calc Af Amer: >60 mL/min (ref >60)
GFR calc non Af Amer: 60 mL/min — ABNORMAL LOW (ref >60)
Glucose, Bld: 96 mg/dL (ref 70–99)
Potassium: 5 mEq/L (ref 3.5–5.1)
Sodium: 139 mEq/L (ref 135–145)
Total Bilirubin: 0.5 mg/dL (ref 0.3–1.2)
Total Protein: 6.2 g/dL (ref 6.0–8.3)

## 2010-05-31 LAB — SURGICAL PCR SCREEN
MRSA, PCR: NEGATIVE
Staphylococcus aureus: POSITIVE — AB

## 2010-05-31 LAB — CBC
HCT: 41.4 % (ref 39.0–52.0)
Hemoglobin: 13.6 g/dL (ref 13.0–17.0)
MCH: 26.8 pg (ref 26.0–34.0)
MCHC: 32.9 g/dL (ref 30.0–36.0)
MCV: 81.7 fL (ref 78.0–100.0)
Platelets: 163 10*3/uL (ref 150–400)
RBC: 5.07 MIL/uL (ref 4.22–5.81)
RDW: 18 % — ABNORMAL HIGH (ref 11.5–15.5)
WBC: 8.7 10*3/uL (ref 4.0–10.5)

## 2010-06-08 ENCOUNTER — Observation Stay (HOSPITAL_COMMUNITY)
Admission: RE | Admit: 2010-06-08 | Discharge: 2010-06-08 | Disposition: A | Discharge status: Home / Self Care | Payer: Managed Care, Other (non HMO) | Source: Ambulatory Visit | Attending: Surgery | Admitting: Surgery

## 2010-06-08 ENCOUNTER — Other Ambulatory Visit: Payer: Self-pay | Admitting: Surgery

## 2010-06-08 DIAGNOSIS — Z01810 Encounter for preprocedural cardiovascular examination: Secondary | ICD-10-CM | POA: Insufficient documentation

## 2010-06-08 DIAGNOSIS — I10 Essential (primary) hypertension: Secondary | ICD-10-CM | POA: Insufficient documentation

## 2010-06-08 DIAGNOSIS — K8010 Calculus of gallbladder with chronic cholecystitis without obstruction: Principal | ICD-10-CM | POA: Insufficient documentation

## 2010-06-08 DIAGNOSIS — F341 Dysthymic disorder: Secondary | ICD-10-CM | POA: Insufficient documentation

## 2010-06-08 DIAGNOSIS — F17200 Nicotine dependence, unspecified, uncomplicated: Secondary | ICD-10-CM | POA: Insufficient documentation

## 2010-06-08 DIAGNOSIS — Z01812 Encounter for preprocedural laboratory examination: Secondary | ICD-10-CM | POA: Insufficient documentation

## 2010-06-08 DIAGNOSIS — M549 Dorsalgia, unspecified: Secondary | ICD-10-CM | POA: Insufficient documentation

## 2010-06-08 DIAGNOSIS — Z01818 Encounter for other preprocedural examination: Secondary | ICD-10-CM | POA: Insufficient documentation

## 2010-06-09 NOTE — Op Note (Signed)
  NAMEBRANDN, MCGATH NO.:  [id number]  MEDICAL RECORD NO.:  [id number]           PATIENT TYPE:  O  LOCATION:  5128                         FACILITY:  [facility]  PHYSICIAN:  Abigail Miyamoto, M.D. DATE OF BIRTH:  1945/10/01  DATE OF PROCEDURE:  06/08/2010 DATE OF DISCHARGE:  06/08/2010                              OPERATIVE REPORT   PREOPERATIVE DIAGNOSIS:  Symptomatic cholelithiasis with chronic cholecystitis.  POSTOPERATIVE DIAGNOSIS:  Symptomatic cholelithiasis with chronic cholecystitis.  PROCEDURE:  Laparoscopic cholecystectomy.  SURGEON:  Abigail Miyamoto, MD  ANESTHESIA:  General and 0.5% Marcaine.  ESTIMATED BLOOD LOSS:  Minimal.  FINDINGS:  The patient had a chronically scarred-appearing gallbladder consistent with chronic cholecystitis.  PROCEDURE IN DETAIL:  The patient was brought to the operating room, identified as Michael Lutz.  He was placed supine on the operating room table and general anesthesia was induced.  His abdomen was then prepped and draped in the usual sterile fashion.  Using a #15 blade, a small vertical incision was made below the umbilicus.  This was carried down to fascia which was then opened with a scalpel.  A hemostat was then used to pass through the peritoneal cavity under direct vision.  A 0 Vicryl pursestring suture was then placed around the fascial opening. The Hasson port was placed through the opening and insulation of the abdomen was begun.  A 5-mm port was then placed in the patient's epigastrium, two more in the right upper quadrant all under direct vision.  The gallbladder was found to be chronically scarred in appearance.  It was grasped and retracted above the liver bed.  The patient's liver bed was slightly nodular consistent with cirrhosis.  The cystic duct and cystic artery were easily dissected out.  A critical window was achieved around both.  The duct was clipped three times proximally and once  distally and transected.  The cystic artery and posterior branch were clipped proximally, distally, and transected as well.  The gallbladder was then slowly dissected free from the liver bed with the electrocautery.  Once the gallbladder was free from the liver bed, it was placed in an endosac and removed through the incision at the umbilicus.  Liver bed was again examined.  Hemostasis was felt to be achieved.  The 0 Vicryl at the umbilicus was then tied in place closing the fascial defect.  The abdomen was irrigated with saline.  Again, hemostasis appeared to be achieved.  All ports were removed under direct vision and the abdomen was deflated.  All incisions were anesthetized with Marcaine and closed with 4-0 Monocryl subcuticular sutures.  Steri-Strips and Band- Aids were then applied.  The patient tolerated the procedure well.  All sponge, needle, and instrument counts were correct at the end of the procedure.  The patient was then extubated in the operating room and taken in a stable condition to the recovery room.     Abigail Miyamoto, M.D.     DB/MEDQ  D:  06/08/2010  T:  06/09/2010  Job:  [id number]  Electronically Signed by Abigail Miyamoto M.D. on 06/09/2010 09:34:59 AM

## 2010-06-15 LAB — DIFFERENTIAL
Basophils Absolute: 0.1 10*3/uL (ref 0.0–0.1)
Basophils Absolute: 0 10*3/uL (ref 0.0–0.1)
Basophils Relative: 1 % (ref 0–1)
Basophils Relative: 0 % (ref 0–1)
Eosinophils Absolute: 0.4 10*3/uL (ref 0.0–0.7)
Eosinophils Absolute: 0.4 10*3/uL (ref 0.0–0.7)
Eosinophils Relative: 3 % (ref 0–5)
Eosinophils Relative: 4 % (ref 0–5)
Lymphocytes Relative: 15 % (ref 12–46)
Lymphocytes Relative: 19 % (ref 12–46)
Lymphs Abs: 1.8 10*3/uL (ref 0.7–4.0)
Lymphs Abs: 1.9 10*3/uL (ref 0.7–4.0)
Monocytes Absolute: 0.6 10*3/uL (ref 0.1–1.0)
Monocytes Absolute: 0.6 10*3/uL (ref 0.1–1.0)
Monocytes Relative: 5 % (ref 3–12)
Monocytes Relative: 6 % (ref 3–12)
Neutro Abs: 9.3 10*3/uL — ABNORMAL HIGH (ref 1.7–7.7)
Neutro Abs: 7 10*3/uL (ref 1.7–7.7)
Neutrophils Relative %: 77 % (ref 43–77)
Neutrophils Relative %: 71 % (ref 43–77)

## 2010-06-15 LAB — PROTIME-INR
INR: 1.05 (ref 0.00–1.49)
INR: 0.99 (ref 0.00–1.49)
Prothrombin Time: 13.6 seconds (ref 11.6–15.2)
Prothrombin Time: 13 seconds (ref 11.6–15.2)

## 2010-06-15 LAB — TYPE AND SCREEN
ABO/RH(D): O POS
Antibody Screen: NEGATIVE

## 2010-06-15 LAB — ABO/RH: ABO/RH(D): O POS

## 2010-06-15 LAB — URINALYSIS, ROUTINE W REFLEX MICROSCOPIC
Bilirubin Urine: NEGATIVE
Glucose, UA: NEGATIVE mg/dL
Glucose, UA: NEGATIVE mg/dL
Hgb urine dipstick: NEGATIVE
Hgb urine dipstick: NEGATIVE
Ketones, ur: 15 mg/dL — AB
Ketones, ur: NEGATIVE mg/dL
Nitrite: NEGATIVE
Nitrite: NEGATIVE
Protein, ur: NEGATIVE mg/dL
Protein, ur: NEGATIVE mg/dL
Specific Gravity, Urine: 1.029 (ref 1.005–1.030)
Specific Gravity, Urine: 1.012 (ref 1.005–1.030)
Urobilinogen, UA: 1 mg/dL (ref 0.0–1.0)
Urobilinogen, UA: 0.2 mg/dL (ref 0.0–1.0)
pH: 5.5 (ref 5.0–8.0)
pH: 6 (ref 5.0–8.0)

## 2010-06-15 LAB — COMPREHENSIVE METABOLIC PANEL
ALT: 23 U/L (ref 0–53)
ALT: 20 U/L (ref 0–53)
AST: 29 U/L (ref 0–37)
AST: 29 U/L (ref 0–37)
Albumin: 4.1 g/dL (ref 3.5–5.2)
Albumin: 3.7 g/dL (ref 3.5–5.2)
Alkaline Phosphatase: 74 U/L (ref 39–117)
Alkaline Phosphatase: 64 U/L (ref 39–117)
BUN: 22 mg/dL (ref 6–23)
BUN: 20 mg/dL (ref 6–23)
CO2: 31 mEq/L (ref 19–32)
CO2: 28 mEq/L (ref 19–32)
Calcium: 9.4 mg/dL (ref 8.4–10.5)
Calcium: 9.4 mg/dL (ref 8.4–10.5)
Chloride: 101 mEq/L (ref 96–112)
Chloride: 103 mEq/L (ref 96–112)
Creatinine, Ser: 1.03 mg/dL (ref 0.4–1.5)
Creatinine, Ser: 0.87 mg/dL (ref 0.4–1.5)
GFR calc Af Amer: >60 mL/min (ref >60)
GFR calc Af Amer: >60 mL/min (ref >60)
GFR calc non Af Amer: >60 mL/min (ref >60)
GFR calc non Af Amer: >60 mL/min (ref >60)
Glucose, Bld: 75 mg/dL (ref 70–99)
Glucose, Bld: 73 mg/dL (ref 70–99)
Potassium: 3.9 mEq/L (ref 3.5–5.1)
Potassium: 4.5 mEq/L (ref 3.5–5.1)
Sodium: 139 mEq/L (ref 135–145)
Sodium: 139 mEq/L (ref 135–145)
Total Bilirubin: 0.4 mg/dL (ref 0.3–1.2)
Total Bilirubin: 0.4 mg/dL (ref 0.3–1.2)
Total Protein: 6.7 g/dL (ref 6.0–8.3)
Total Protein: 6 g/dL (ref 6.0–8.3)

## 2010-06-15 LAB — CBC
HCT: 48.8 % (ref 39.0–52.0)
HCT: 45.2 % (ref 39.0–52.0)
Hemoglobin: 16.7 g/dL (ref 13.0–17.0)
Hemoglobin: 15.4 g/dL (ref 13.0–17.0)
MCHC: 34.1 g/dL (ref 30.0–36.0)
MCHC: 34 g/dL (ref 30.0–36.0)
MCV: 89.3 fL (ref 78.0–100.0)
MCV: 88.7 fL (ref 78.0–100.0)
Platelets: 145 10*3/uL — ABNORMAL LOW (ref 150–400)
Platelets: 146 10*3/uL — ABNORMAL LOW (ref 150–400)
RBC: 5.47 MIL/uL (ref 4.22–5.81)
RBC: 5.1 MIL/uL (ref 4.22–5.81)
RDW: 15.5 % (ref 11.5–15.5)
RDW: 14.9 % (ref 11.5–15.5)
WBC: 12.1 10*3/uL — ABNORMAL HIGH (ref 4.0–10.5)
WBC: 9.8 10*3/uL (ref 4.0–10.5)

## 2010-06-15 LAB — URINE MICROSCOPIC-ADD ON

## 2010-06-15 LAB — APTT
aPTT: 34 seconds (ref 24–37)
aPTT: 33 seconds (ref 24–37)

## 2010-06-30 NOTE — Discharge Summary (Signed)
  Michael Lutz, Michael Lutz                ACCOUNT NO.:  [id number]  MEDICAL RECORD NO.:  [id number]           PATIENT TYPE:  O  LOCATION:  5128                         FACILITY:  [facility]  PHYSICIAN:  Abigail Miyamoto, M.D. DATE OF BIRTH:  1946-01-04  DATE OF ADMISSION:  06/08/2010 DATE OF DISCHARGE:  06/08/2010                              DISCHARGE SUMMARY   DISCHARGE DIAGNOSIS:  Symptomatic cholelithiasis.  SUMMARY OF HISTORY:  This is a 65 year old gentleman who presents with symptomatic cholelithiasis.  Decision was made to proceed to the operating room for laparoscopic cholecystectomy.  HOSPITAL COURSE:  The patient was admitted, taken to the operating room where he underwent a laparoscopic cholecystectomy.  He tolerated the procedure well and was taken to the regular surgical floor.  Later that day, he was doing quite well and had no issues.  He was started on regular diet.  No pain medications and wished to be discharged home, so he was discharged home.  DISCHARGE DIET:  Regular.  DISCHARGE ACTIVITY:  He will do no heavy lifting.  He may shower.  MEDICATIONS:  Please see the universal medical reconciliation form.  DISCHARGE FOLLOWUP:  He will follow up with my office in approximately 2 weeks after discharge.     Abigail Miyamoto, M.D.     DB/MEDQ  D:  06/21/2010  T:  06/22/2010  Job:  [id number]  Electronically Signed by Abigail Miyamoto M.D. on 06/30/2010 10:58:54 AM

## 2010-07-26 NOTE — Procedures (Signed)
DUPLEX ULTRASOUND OF ABDOMINAL AORTA   INDICATION:  Follow up of known AAA.   HISTORY:  Diabetes:  No.  Cardiac:  No.  Hypertension:  Yes.  Smoking:  Yes, 1 pack per day.  Connective Tissue Disorder:  Family History:  No.  Previous Surgery:  No.   DUPLEX EXAM:         AP (cm)                   TRANSVERSE (cm)  Proximal             2.10 cm                   2.10 cm  Mid                  3.31 cm                   3.34 cm  Distal               1.79 cm                   1.80 cm  Right Iliac          1.02 cm                   1.04 cm  Left Iliac           0.99 cm                   0.97 cm   PREVIOUS:  Date: 09/08/2006 (MRI)  AP:  3.4  TRANSVERSE:   IMPRESSION:  Known AAA with maximum diameter measuring 3.31 cm (AP) x  3.34 cm.   ___________________________________________  Janetta Hora. Fields, MD   PB/MEDQ  D:  06/17/2007  T:  06/17/2007  Job:  [id number]

## 2010-07-26 NOTE — Procedures (Signed)
DUPLEX ULTRASOUND OF ABDOMINAL AORTA   INDICATION:  Follow up of known abdominal aortic aneurysm.   HISTORY:  Diabetes:  No.  Cardiac:  No.  Hypertension:  Yes.  Smoking:  Yes.  Connective Tissue Disorder:  Family History:  No.  Previous Surgery:  No.   DUPLEX EXAM:         AP (cm)                   TRANSVERSE (cm)  Proximal             2.62 cm                   2.63 cm  Mid                  3.91 cm                   3.87 cm  Distal               2.70 cm                   2.94 cm  Right Iliac          1.24 cm                   1.92 cm  Left Iliac           1.49 cm                   1.42 cm   PREVIOUS:  Date:  AP:  3.49  TRANSVERSE:  3.55   IMPRESSION:  Slight increase in abdominal aortic aneurysm since last  study done on 12/18/07.   ___________________________________________  Janetta Hora. Fields, MD   AC/MEDQ  D:  07/08/2008  T:  07/08/2008  Job:  [id number]

## 2010-07-26 NOTE — Assessment & Plan Note (Signed)
The patient is a 65 year old male with lumbar spinal stenosis.  He has  multifactorial problems in his lumbar spine including disk degeneration  as well as facet arthropathy.  He was evaluated by Dr. Alveda Reasons yesterday,  told that fusion was the only surgical option, but really the patient  felt that this was not strongly endorsed.  The patient has not had any  new problems.  He continues to have sleep disturbance due to pain.  His  Oswestry score is 42%.  He is able to work 40 hours a week.  His pain is  8/10 and particularly at night, he has lower extremity pain.  The  gabapentin has been helping daytime wise, but not so much at night.  His  relief from meds is poor to fair.   CURRENT MEDICATIONS:  1. Kadian 30 nightly, which really has not worked any better than the      MS Contin 30 mg.  2. His gabapentin dose is 30 q.i.d.   OTHER PAST MEDICAL HISTORY:  Significant for bipolar disorder.   PHYSICAL EXAMINATION:  GENERAL:  No acute distress.  Mood and affect are  appropriate.  EXTREMITIES:  His lower extremity strength is good.  Deep tendon  reflexes are reduced to 1+ bilateral knees and Achilles.  His gait is  normal.  No evidence of toe drag or knee instability.  BACK:  No tenderness to palpation.  Lumbar spine range of motion is 75%  forward flexion and 50% extension.   IMPRESSION:  1. Lumbar pain with radicular discomfort and lumbar post-laminectomy      syndrome.  I discussed treatment options including adjustment of      medications, which we will do today.  We will increase his      gabapentin to 300 t.i.d. and 600 at night for the first week and      then go up to 400 mg t.i.d. and 800 at night.  We will switch him      back to MS Contin 30 mg at night.  He is to call me in about 2      weeks to let us know how he is doing and if no better consider      adding an additional 15 mg of MS Contin at night.  2. As we discussed other treatment options including spinal cord  stimulation.  I gave him some information on that including the DVD      from [facility]   I discussed this with him and he is interested in looking into it.  I  will see him back in 1 month.      Erick Colace, M.D.  Electronically Signed     AEK/MedQ  D:  07/14/2008 09:26:52  T:  07/14/2008 22:41:56  Job #:  [id number]   cc:   Nelda Severe, MD  Fax: [phone]

## 2010-07-26 NOTE — Procedures (Signed)
[name], [name]                ACCOUNT NO.:  [id number]   MEDICAL RECORD NO.:  [id number]          PATIENT TYPE:  REC   LOCATION:  TPC                          FACILITY:  [facility]   PHYSICIAN:  Erick Colace, M.D.DATE OF BIRTH:  1945/06/01   DATE OF PROCEDURE:  DATE OF DISCHARGE:                               OPERATIVE REPORT   PROCEDURE:  Bilateral L5 dorsal ramus injection, bilateral L4 medial  branch block, bilateral L3 medial branch block under fluoroscopic  guidance.   INDICATIONS:  Pain that is in the 7/10 range mainly at night and first  thing in the morning which interferes with sleep only partially  responsive to medication management.  He also has pain which is activity  related during gardening activities.   Informed consent was obtained after describing risks and benefits to the  patient.  These include bleeding, bruising, infection, loss of bowel  bladder function, temporary permanent paralysis, elects to proceed and  has given written consent.  The patient placed prone on fluoroscopy  table.  Betadine prep, sterile drape 25-gauge 1-1/2 inch needle was used  to anesthetize skin and subcu tissue 1% lidocaine x2 mL.  Then a 22  gauge 3-1/2 inch spinal needle was inserted under fluoroscopic guidance  targeting the left S1 SAP sacral ala junction bone contact made  confirmed with lateral imaging.  Omnipaque 180 x 0.5 mL demonstrated no  intravascular uptake then 0.5 mL of dexamethasone lidocaine solution  injected.  This consists of 1 mL 4 mg per mL dexamethasone in 2 mL of 1%  MPF lidocaine.  The left L5 SAP transverse junction targeted, bone  contact made confirmed lateral imaging.  Omnipaque 180 x 0.5 mL  demonstrated no intravascular uptake and 0.5 mL dexamethasone lidocaine  solution injected.  The left L4 SAP transverse junction targeted, bone  contact made, confirmed with lateral imaging.  Omnipaque 180 x 0.5 mL  demonstrated no intravascular uptake and 0.5  mL dexamethasone lidocaine  solution injected.  The same procedure was made on the right side at  corresponding levels using same injectate, technique and equipment.  The  patient tolerated procedure well.  Post injection pain level 0/10 but  given the activity related pain, will need to keep pain diary and assess  over longer period of time, will schedule medial branch block should he  have at least 50% pain relief during more painful activities.      Erick Colace, M.D.  Electronically Signed     AEK/MEDQ  D:  12/11/2006 17:30:44  T:  12/12/2006 07:07:16  Job:  [id number]

## 2010-07-26 NOTE — Assessment & Plan Note (Signed)
OFFICE VISIT   Michael Lutz, Michael Lutz  DOB:  [birth year]                                       10/24/2006  ZOXWR#:[id number]   The patient is a 65 year old male who recently was noted to have a small  abdominal aortic aneurysm on an MRI of lumbar spine for chronic back  pain.  Dr. Alveda Reasons operated on him 1 year ago for sciatica.  His symptoms  have improved somewhat.  He denies any family history of abdominal  aortic aneurysm.  His atherosclerotic risk factors include hypertension  and elevated cholesterol.  He denies any history of coronary artery  disease or diabetes.  He is a smoker and smokes 1 pack of cigarettes per  day.  He states he is interested in quitting but has been successful.   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  He also has some mild  vertigo which he treats intermittently with meclizine.   PAST SURGICAL HISTORY:  He has had his previous back operation, multiple  right foot operations, a right shoulder operation, tonsillectomy and a  right testicle operation.   FAMILY HISTORY:  Unremarkable.   SOCIAL HISTORY:  He works in the [employer] at [employer] [place].  He is single.  Smoking history is as above.  He does not consume alcohol  regularly.   REVIEW OF SYSTEMS:  He is 5 feet 11 inches, 245 pounds.  He states that  he thinks that he may have had some type of irregular heart rhythm,  possibly atrial fibrillation and has a yearly EKG to follow this up.  He  has not been symptomatic from this.  He denies history of asthma,  wheezing, GI bleeding, renal dysfunction, TIA, stroke, seizures or  syncopal episodes.  He does have multiple joint arthritis and some mild  depression.   MEDICATIONS:  1. Lipitor 80 mg once a day.  2. Quinapril 80 mg once a day.  3. Hydrochlorothiazide 12.5 mg once a day.  4. Wellbutrin 450 mg once a day.  5. Abilify 10 mg once a  day.  6. Methylin 10 mg once a day.  7. Aspirin 81 mg once a day.  8. Multivitamin once a day.  9. Vitamin E 400 international units once a day.  10.Super B Complex once a day.  11.Vitamin D 2000 international units once a day.  12.Omega 3 fish oil 1200 mg once a day.  13.Vitamin C 1000 mg once a day.  14.Flomax 4 mg in the evenings.  15.Seroquel 200-300 mg once in the evening.  16.Diclofenac 75 mg as needed for pain.  17.Cyclobenzaprine 10 mg for pain.  18.Ranitidine 75 mg once a day.  19.Amlodipine, dose unknown.  20.Librium 7.5 mg p.r.n.  21.Ibuprofen p.r.n.  22.Tums p.r.n.  23.Sudafed p.r.n.   ALLERGIES:  He has no known drug allergies.   PHYSICAL EXAMINATION:  Vital signs:  Blood pressure 133/90, heart rate  97 and regular.  HEENT:  Unremarkable.  Neck:  Carotids 2+ with no  bruit.  Chest:  Clear to auscultation.  Cardiac:  Regular rate and  rhythm without murmur.  Abdomen:  Slightly obese, soft, nontender and  nondistended with no pulsatile mass.  Extremities:  He has 2+ radial,  femoral, dorsalis pedis and posterior tibial pulses.  He has 1+  popliteal pulses bilaterally.  Feet are warm and adequately perfused.  He has some padding on the right side due to several misshapen toes and  a bunion on the side, there are no open wounds.   I reviewed his MRI of the lumbar spine from June 28 at [facility].  It shows a 3.4-cm infrarenal abdominal aortic aneurysm.   ASSESSMENT AND PLAN:  The patient has a small abdominal aortic aneurysm.  I discussed with him today the pathology of aortic aneurysms, as well as  the indications for repair.  I informed him that the risk of rupture of  an aneurysm of this size is quite low and the risk for the rupture of an  aneurysm less than 5.5 cm is less than 1% per year; however, I did also  counsel him that these can grow over time and that we will have him  return for followup in 6 months'  time for a repeat ultrasound and then  65-month intervals thereafter to  follow the aneurysm to see if it ever gets to the size of 5.5 cm or  greater.   Janetta Hora. Fields, MD  Electronically Signed   CEF/MEDQ  D:  10/24/2006  T:  10/26/2006  Job:  [id number]   cc:   Nelda Severe, MD  Vikki Ports, M.D.

## 2010-07-26 NOTE — Assessment & Plan Note (Signed)
Michael Lutz returns today.  I last saw him January 29, 2007, at which  time I did a trigger point injection of gluteus medius.  He did well  with this.  Overall, he is really quite happy with how he has done.  His  MS Contin was increased to 30 mg q.h.s.  He is not taking any during the  day.  He is using Diclofenac 75 mg at night, as well as cyclobenzaprine  10 mg t.i.d.   He is walking 40 minutes at a time.  He climbs steps.  He drives.  He  works 40 hours a week.  She sleeps well.   PHYSICAL EXAMINATION:  VITAL SIGNS:  Blood pressure 148/74, pulse 100,  respiratory rate 18, O2 sat 95% on room air.  GENERAL:  No acute distress.  Mood and affect appropriate.  BACK:  No tenderness to palpation.  He has good forward flexion.  Extension is limited to about 50% and is accompanied by pain.  EXTREMITIES:  His lower extremity strength and range of motion are  normal.  Deep tendon reflexes are normal.   IMPRESSION:  Lumbar facet syndrome, as well as chronic myofacial pain.  Overall, he is doing quite well with pain levels in the 0 to 1 range. I  will ask him to try stopping his Diclofenac and see how he does without  this, just use MS Contin 30 mg at night and cyclobenzaprine 10 mg t.i.d.  I will see him back in about 3 months.  He anticipates increasing level  of activity some time around then and thinks he might have some  increased pain levels when he starts gardening again.  A nursing visit  in one month for pill counts to insure compliance.      Michael Lutz, M.D.  Electronically Signed     AEK/MedQ  D:  02/25/2007 15:09:04  T:  02/26/2007 07:40:28  Job #:  [id number]   cc:   Nelda Severe, MD  Fax: [phone]

## 2010-07-26 NOTE — Procedures (Signed)
Michael Lutz, Michael Lutz                ACCOUNT NO.:  [id number]   MEDICAL RECORD NO.:  [id number]          PATIENT TYPE:  REC   LOCATION:  [facility]                          FACILITY:  [facility]   PHYSICIAN:  Erick Colace, M.D.DATE OF BIRTH:  08-31-1945   DATE OF PROCEDURE:  12/31/2006  DATE OF DISCHARGE:                               OPERATIVE REPORT   PROCEDURE:  Bilateral L5 dorsal ramus injection, bilateral L4 medial  branch block, bilateral L3 medial branch block under fluoroscopic  guidance.   INDICATIONS FOR PROCEDURE:  Lumbar facet mediated pain relieved x1 by  lumbar medial branch blocks performed March 13, 2006 at the  corresponding levels.   Informed consent was obtained after describing the risks and benefits of  the procedure to the patient.  These include bleeding, bruising,  infection, loss of bowel or bladder function, temporary or permanent  paralysis.  He elects to proceed and has given written consent.   The patient placed prone on fluoroscopy table.  Betadine prep, sterile  drape. 25-gauge 1-1/2-inch inch needle was used to anesthetize the skin  and subcu tissue. 1% lidocaine x2 mL and 22-gauge 3-1/2-inch spinal  needle was inserted under fluoroscopic guidance, first starting at the  left L5 SAP/transverse process junction.  Bone contact made and  confirmed with lateral imaging.  Omnipaque 180 x 0.5 mL demonstrated no  intravascular uptake, and 0.5 mL of the Depo-Medrol lidocaine solution  was injected.  Then the left L5 SAP/ transverse process junction  targeted, bone contact made and confirmed with lateral imaging.  Omnipaque 180 x 0.5 mL demonstrated no intravascular uptake and 0.5 mL  Depo-Medrol lidocaine solution injected.  Then the left L4  SAP/transverse process junction targeted bone, contact made and  confirmed with lateral imaging.  Omnipaque 180 x 0.5 mL demonstrated no  intravascular uptake an d then 0.5 mL of Depo-Medrol lidocaine solution  was  injected.  The same procedure was repeated on the right side using  same injectate, same equipment, and same technique at the corresponding  levels.   The patient tolerated procedure well.  Pre injection pain level  related  pain 6-8/10, post injection about a 6/10.  Will give a pain diary.  If  he has similar 50% or greater drop in his pain levels, consider lumbar  RF next month.      Erick Colace, M.D.  Electronically Signed     AEK/MEDQ  D:  12/31/2006 13:58:57  T:  01/01/2007 10:32:10  Job:  [id number]

## 2010-07-26 NOTE — Procedures (Signed)
DUPLEX ULTRASOUND OF ABDOMINAL AORTA   INDICATION:  Follow up abdominal aortic aneurysm.   HISTORY:  Diabetes:  No.  Cardiac:  No.  Hypertension:  Yes.  Smoking:  Yes.  Connective Tissue Disorder:  Family History:  Previous Surgery:   DUPLEX EXAM:         AP (cm)                   TRANSVERSE (cm)  Proximal             2.76 cm                   2.27 cm  Mid                  3.49 cm                   3.55 cm  Distal               2.83 cm                   2.73 cm  Right Iliac          1.41 cm                   1.34 cm  Left Iliac           1.59 cm                   1.16 cm   PREVIOUS:  Date:  AP:  3.31  TRANSVERSE:  3.34   IMPRESSION:  Abdominal aortic aneurysm noted with the largest  measurement of (3.49 cm X 3.55 cm).   ___________________________________________  Janetta Hora Fields, MD   MG/MEDQ  D:  12/18/2007  T:  12/18/2007  Job:  [id number]

## 2010-07-26 NOTE — Assessment & Plan Note (Signed)
Michael Lutz returns today, he was scheduled for caudal epidural steroid  injection, but has a new complaint of increased back pain in his back  and buttock area.  He denies any fever, any difficulty with urination,  and no lower extremity weakness.  He does have some left lateral thigh  pain.  He has no nausea or vomiting.  No abdominal pain.  Sleep is poor.  He can walk 10 minutes at a time on steps.  His Oswestry disability  scale is 22 today.   PHYSICAL EXAMINATION:  VITAL SIGNS:  His blood pressure is 130/96, pulse  80, respiratory rate 18, and O2 saturation 97% on room air.  GENERAL:  In no acute distress.  Mood and affect appropriate.  Orientation x3.  Gait is normal.  His lower extremity strength is  normal.  He lower extremity reflexes are normal.  His lower extremity  range of motion is normal.  He has tenderness to palpation on lumbar and  sacral areas.  He has pain with forward flexion and extension of the  lumbar spine.  His tone is normal in lower extremities.   Extremities without edema.   IMPRESSION:  Exacerbation of low back pain.  He does have some radicular  symptomatology as well, some sciatica.  We will proceed with a caudal  epidural steroid injection today.  We will add Robaxin at night.  We  discussed pros and cons of going back on longer acting Morphine.  We  will stick with the hydrocodone with the added Robaxin at night.  See  him back in 2 months.      Erick Colace, M.D.  Electronically Signed     AEK/MedQ  D:  11/21/2007 09:22:31  T:  11/21/2007 21:51:23  Job #:  [id number]

## 2010-07-26 NOTE — Procedures (Signed)
DUPLEX ULTRASOUND OF ABDOMINAL AORTA   INDICATION:  Follow up abdominal aortic aneurysm.   HISTORY:  Diabetes:  No.  Cardiac:  Valve problems.  Hypertension:  Yes.  Smoking:  Yes.  Connective Tissue Disorder:  Family History:  No.  Previous Surgery:  No.   DUPLEX EXAM:         AP (cm)                   TRANSVERSE (cm)  Proximal             2.2 cm                    2.60 cm  Mid                  4.23 cm                   3.80 cm  Distal               3.57 cm                   3.51 cm  Right Iliac          1.41 cm                   1.57 cm  Left Iliac           1.51 cm                   1.54 cm   PREVIOUS:  Date:  AP:  3.91  TRANSVERSE:  3.87   IMPRESSION:  There is an abdominal aortic aneurysm noted with largest  measurement of 4.23 cm X 3.80 cm, which is an increase from previous  study.   ___________________________________________  Janetta Hora Fields, MD   CB/MEDQ  D:  06/16/2009  T:  06/16/2009  Job:  [id number]

## 2010-07-26 NOTE — Group Therapy Note (Signed)
REASON FOR CONSULTATION:  Left elbow and left knee pain.   CHIEF COMPLAINT:  Back pain with left hip discomfort.   HISTORY:  A 65 year old male who had insidious onset of back pain around  2003 or 2004 and then had some progressive left lower extremity pain and  had epidural steroid injections, but no physical therapy and then  underwent a left L4 laminectomy October 17, 2005, per Nelda Severe, M.D.  He had a small epidural hematoma which was evacuated October 19, 2005, but  has had no other complications since that time.  He has had relief of  his left lower extremity pain, but not of his back pain or left hip  pain.   He has tried different treatments including acupuncture which was not  particularly helpful, diclofenac he took one a day and was not helpful  at that dose, he was tried on cyclobenzaprine at night which produced no  side effects but also did not seem to help his pain.  He does get relief  taking ibuprofen at night when he goes to bed around 10 o'clock, but  then he wakes up at 2 o'clock at which time he takes hydrocodone and  then he is able to sleep until 7 a.m.   His pain is generally relieved by bending forward and he does this while  he is gardening to relieve his back pain.   His pain level averaging at 4 and increases with activity.  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   REVIEW OF SYSTEMS:  Positive for dizziness, depression, anxiety, and  night sweats.   PAST MEDICAL HISTORY:  Significant for bipolar disorder.  He sees Dr.  Clare Gandy from psychiatry.  He has history of hypertension,  hypercholesterolemia, and benign prostatic hypertrophy.   PAST SURGICAL HISTORY:  Significant for metatarsal head resection and  has had bunionectomies of the right foot.  He had postoperative  infection with what sounds like osteomyelitis which has now resolved,  but he has had some persistent foot deformities  at the toes on the right  side.  He has had shoulder surgery in 2005.   SOCIAL HISTORY:  He is single, lives alone, and smokes half pack per  day.   MEDICATIONS:  1. Flomax prescribed by Bertram Millard. Dahlstedt, M.D. from urology.  2. Temazepam.  3. Methylin which is methylphenidate.  4. Abilify.  5. Tranxene.  6. Seroquel prescribed by Dr. Clare Gandy.  7. Hydrocodone 5/500 once at night prescribed by Dr. Alveda Reasons.  He no      longer takes cyclobenzaprine or Diclofenac, but these are listed.  8. Vikki Ports, M.D. prescribes aspirin 81 mg a day.  9. Amlodipine.  10.Hydrochlorothiazide.  11.Quinapril.  12.Lipitor.  13.Meclizine p.r.n.  14.Wellbutrin 150 mg three tablets per day by Dr. Clare Gandy.   FAMILY HISTORY:  Diabetes and high blood pressure.   PHYSICAL EXAMINATION:  GENERAL:  A well-developed, well-nourished male  in no acute distress.  He is alert, appropriate, oriented x3.  VITAL SIGNS:  He is 5 feet 11 inches, weight 245 pounds.  Blood pressure  162/95, pulse 102, respirations 98.  NEUROLOGY:  Gait is normal.  He is able to toe-walk and heel-walk.  His  sensation in the right lower extremity is diminished at the toes, intact  at the L4.  He has surgical scars on the dorsum of the first and second  toes  on the right with some valgus deformity of both of those toes.  He  has normal sensation to the upper extremities with the exception of mild  reduction in right C6.  His neck range of motion is full, upper  extremity range of motion and strength is full.  Deep tendon reflexes  are normal in upper and lower extremities.  Lower extremity range of  motion is normal with the exception of the toes on the right side with  decreased flexion and extension.  Hip range of motion is good without  pain.  Knees are without evidence of effusion or crepitus.  His elbow on  the right has no tenderness over the medial level epicondyle.  No pain  with resisted wrist extension.   Review of MRI  reveals facet degeneration L3-4 and right L5-S1 greater  than left, decreased disk height L2-3 and some multifactorial stenosis  at L3-4.   IMPRESSION:  1. Mainly axial back pain, some hip radiation.  I do not think he has      primary hip problem on the left side given good range of motion and      no tenderness over the greater trochanter.  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  We will further evaluate and treat using lumbar      medial branch blocks and I have given him information about this      illustrating on a spine model.  2. From medication management standpoint, we will first get a urine      drug screen to look for illicit drugs as well as unreported opiates      and assuming that this checks out well, I can assume his      hydrocodone prescription and possibly try him with a longer acting      agent at night such as MS Contin 15 mg.  I would not use Tramadol      given his other psychiatric medications.   Overall he is at a slightly higher risk using opiates given his bipolar  disorder in terms of substance misuse but we can carefully monitor him  on this.   He will need some physical therapy but I would first do the facet  injection prior to initiating physical therapy.      Erick Colace, M.D.  Electronically Signed     AEK/MedQ  D:  11/27/2006 10:47:53  T:  11/27/2006 11:58:28  Job #:  [id number]   cc:   Lubertha Basque. Jerl Santos, M.D.  Fax: [phone]   Nelda Severe, MD  Fax: [phone]

## 2010-07-26 NOTE — Assessment & Plan Note (Signed)
OFFICE VISIT   Michael Lutz, Michael Lutz  DOB:  06/10/1945                                       08/25/2009  EAVWU#:[id number]   CHIEF COMPLAINT:  Abdominal aortic aneurysm.   HISTORY OF PRESENT ILLNESS:  The patient is a 65 year old male who I  previously saw in 2008 for a small abdominal aortic aneurysm.  His  aneurysm diameter was 3.4 cm in 2008.  He recently had a CT scan of the  spine for evaluation of back pain and only portions of the aneurysm were  seen.   Chronic medical problems include his back pain, elevated cholesterol and  hypertension, all of which are currently controlled.   PAST SURGICAL HISTORY:  Right foot, spine surgery in 2007, spinal  stimulator in 2010, TURP in 1994.   SOCIAL HISTORY:  He is single.  He is a current smoker of one pack of  cigarettes per day.  He does not consume alcohol regularly.   FAMILY HISTORY:  Unremarkable.   REVIEW OF SYSTEMS:  Full 12 point review of systems was performed with  the patient today.  Please see intake referral form for details  regarding that.   His medications include ranitidine, clonazepam, Effexor,  hydrochlorothiazide, hydrocodone, Seroquel, amlodipine, meclizine,  quinapril, lamotrigine, Lidoderm patch, Sudafed, Provigil, aspirin,  Klonopin, Norflex, trazodone, nabumetone, ibuprofen, Exalgo, Lipitor,  vitamin Lutz, vitamin E, multivitamin, fish oil, potassium, magnesium,  super B complex, D3, calcium and niacin.   He has no known drug allergies.   PHYSICAL EXAM:  Vital signs:  Blood pressure 152/93 in the left arm,  heart rate is 89 and regular, respirations 24.  HEENT:  Unremarkable.  Neck:  Has 2+ carotid pulses with no bruit.  Chest:  Clear to  auscultation.  Cardiac:  Exam is regular rate and rhythm without murmur.  Abdomen:  Soft, nontender, nondistended, no palpable mass.  Extremities:  He has 2+ femoral, dorsalis pedis and posterior tibial pulses  bilaterally.  Musculoskeletal:  Shows  no significant major joint  deformities in the lower or upper extremities with no significant edema.  Neurological:  Exam shows symmetric upper extremity and lower extremity  motor strength which is 5/5.  Skin:  Has no open ulcers or rashes.   The patient has a small abdominal aortic aneurysm which has grown  slightly over the last 3 years.  Recent CT scan showed some enlargement  of this.  Although he has had ultrasound recently which suggested the  aneurysm is probably just over 4 cm I believe the best option in light  of his pending back operation would be to get a CT angiogram of the  abdomen and pelvis to further define the anatomy of his aneurysm.  If  the aneurysm is less than 5 cm in diameter then at this point we would  probably do continued observation.  He will return for followup after  his CT angiogram for further decision making.     Janetta Hora. Fields, MD  Electronically Signed   CEF/MEDQ  D:  08/26/2009  T:  08/26/2009  Job:  [id number]   cc:   Nelda Severe, MD  Dr Vear Clock  Dr Hyacinth Meeker

## 2010-07-26 NOTE — Assessment & Plan Note (Signed)
OFFICE VISIT   MAJESTIC, BRISTER  DOB:  02-15-1946                                       09/08/2009  ZOXWR#:[id number]   The patient returns for follow-up today for a small abdominal aortic  aneurysm.  He was last seen on August 25, 2009.  He returns today to  follow up on the findings of the CT angiogram.   CT scan of the abdomen and pelvis was performed today and I reviewed  these findings.  The CT scan shows the aneurysm is approximately 4.6 to  4.7 cm in diameter.  No evidence of rupture.  He did have a small benign  adrenal adenoma as well.   PHYSICAL EXAM:  Today blood pressure 159/103 in the left arm, heart rate  82 and regular, respirations 24.  Abdomen:  Soft, nontender,  nondistended.  No palpable masses.   I had a lengthy discussion with the patient today and informed him that  his aneurysm is still fairly small and the risk of rupture of a 4.7 cm  aneurysm would be much less than 1% per year.  I discussed with him that  if the aneurysm continues to grow over time and reaches a threshold of  5.5 cm in diameter, we would consider repair at that point.  Otherwise  he will have continued ultrasound surveillance follow-up and will return  in six months' time for a repeat abdominal aortic ultrasound.  At this  point I told him it would be fine to proceed with his back surgery with  Dr. Alveda Reasons whenever he thinks this should be scheduled.  I will see the  patient again in 6 months.     Janetta Hora. Fields, MD  Electronically Signed   CEF/MEDQ  D:  09/08/2009  T:  09/09/2009  Job:  [id number]   cc:   Nelda Severe, MD  Dr. Vear Clock  Dr. Hyacinth Meeker

## 2010-07-26 NOTE — Assessment & Plan Note (Signed)
OFFICE VISIT   Lutz, Michael C  DOB:  [birth year]                                       11/19/2009  EAVWU#:[id number]   CHIEF COMPLAINT:  Leg pain.   HISTORY OF PRESENT ILLNESS:  The patient is a 65 year old male who  underwent percutaneous aneurysm stent graft repair 3 days ago.  He has  been complaining of pain in his hips, thighs and calves bilaterally  since discharge from the hospital.  He states this is currently not  relieved by his pain medications which include hydrocodone and Exalgo.  He states that the pain is fairly continuous whether he is sitting or  standing or walking.  The pain is primarily relieved if he is lying on  his side with a pillow between his legs.  He states that this pain is  similar to previous exacerbations of his back pain in the past.  He  denies any swelling in his groins.  He denies any rest pain in his feet.  He has a small amount of numbness on the medial aspect of his right  thigh but otherwise has no numbness or tingling in his feet.   On review of systems he denies any shortness of breath or chest pain.   On physical exam blood pressure is 168/92 in the left arm, heart rate is  92 and regular.  Temperature is 98, respirations 24.  Lower extremity  exam:  He has no hematoma in the left or right groin.  He has 2+ femoral  pulses.  He has 2+ dorsalis pedis pulses bilaterally.  Skin:  Pink and  warm in both feet.  There is no open ulcer or rash.   IMPRESSION:  Postoperative leg pain most likely secondary to the  patient's underlying spine disease.  I spoke with Leroy Sea with  [facility] about the patient and she is going to prescribe  some extra pain medication for him to last him the next several days so  that he will avoid high levels of Tylenol from the hydrocodone that he  is currently taking.  He does not appear to have any vascular compromise  at this time and I do not believe his symptoms  are related to his  aneurysm repair.  He will follow up at his regularly scheduled  appointment in one month's time.     Janetta Hora. Fields, MD  Electronically Signed   CEF/MEDQ  D:  11/19/2009  T:  11/19/2009  Job:  [id number]   cc:   [facility]

## 2010-07-26 NOTE — Assessment & Plan Note (Signed)
Mr. Michael Lutz is 65 year old male who has had a back pain since 2003-2004.  At one point, he had left lower extremity pain.  He underwent left L4  laminectomy, October 17, 2005, per Dr. Alveda Reasons.  He had a small epidural  hematoma postoperatively but no other complications.  Then he was  managed medically for axial back pain as well as some hip radiation  pattern.  Trialed on medial branch blocks which helped with some  activity-related pain but did not reach the 30% range.  At one point, he  was on MS Contin at night.  He was doing somewhat better.  We were able  to get him down to hydrocodone at night but then, he has once again had  problems with increasing back pain at night.  We trialed him on Amrix.  This caused some confusion and grogginess.  Therefore, this was  discontinued after the patient called in.  He developed some increasing  left-sided symptomatology in the L3 distribution, underwent L3-L4  transforaminal injection on September 23, 2007, then a left L2-L3  transforaminal; once again, no significant relief and then a caudal  epidural which did help with his tail bone distribution pain but not  with his lower extremity symptomatology.  He is now complaining of right-  sided symptomatology in the lower extremity, sometimes numbness in the  right ankle area.  He had one episode where he got some footdrop in a  store when he was walking for a long time but no recurrence of this.   His Oswestry disability index score today is 36%.   He is still employed 40 hours a week.   REVIEW OF SYSTEMS:  Positive for back spasms and trouble walking.  His  current pain is at 1 but is averaging 8.  This is mainly activity  related with some night-time pain.   PHYSICAL EXAMINATION:  VITAL SIGNS:  His blood pressure is 159/89, pulse  93, respirations 16, and O2 sat 98% on room air.  GENERAL:  No acute distress.  Mood and affect are appropriate.  EXTREMITIES:  His gait is normal.  He has normal strength  bilateral hip  flexion, knee extension, and ankle dorsiflexion.  His sensation is  reduced in the right L3-L4 and 5 dermatome and to a lesser degree, S1  dermatome.  Left side has normal sensation.   Hip, knee, and ankle range of motion normal.  Deep tendon reflexes  reduced bilateral ankle and knees but no asymmetry.  His back range of  motion is 75% full flexion and extension.   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  We will go ahead and make referral back to Dr. Alveda Reasons.  He sounds like he  needs a repeat MRI and evaluate for surgical options.  He has really had  minimal response to epidural steroid injections, and the patient really  does not want to pursue this at this time.  We will continue his pain  management, add hydrocodone 5 mg during the day and MS Contin at night.  We may need to add some Neurontin.  He was on some psychiatric  medications which increased the possibility of side effects, but this  remains a treatment option.  We will give him some Robaxin at night for  muscle spasm as he has tolerated this in  the past.  I will see him back  in 1 month's time.  I would like to get a copy of his MRI once it is  done.      Erick Colace, M.D.  Electronically Signed     AEK/MedQ  D:  03/16/2008 09:39:16  T:  03/17/2008 00:19:46  Job #:  [id number]   cc:   Nelda Severe, MD  Fax: [phone]

## 2010-07-26 NOTE — Assessment & Plan Note (Signed)
OFFICE VISIT   Michael Lutz, Michael Lutz  DOB:  1945-08-22                                       10/20/2009  UJWJX#:[id number]   The patient returns for followup today.  He has been very anxious about  his abdominal aortic aneurysm.  It is currently 4.7 cm in diameter.  He  wishes to consider operative repair at this time.  I have had  discussions with the patient on several occasions about the risk of  rupture of a 4.7 cm aneurysm.  However, his anxiety level is of a state  that he wishes to have repair at this point.  I reviewed the films with  the [facility] last week and it appears that he should be a  candidate for endovascular repair, although he does have some iliac  tortuosity.   He denies any changes in abdominal pain or back pain.   PHYSICAL EXAM:  Vital signs:  Today blood pressure is 143/81 in the left  arm, heart rate is 86 and regular, oxygen saturation is 98%.  HEENT:  Unremarkable.  Neck:  Has 2+ carotid pulses without bruit.  Chest:  Clear to auscultation.  Cardiac:  Exam is regular rate and rhythm.  Abdomen:  Is soft, nontender, nondistended.  He has 2+ femoral pulses  bilaterally.   REVIEW OF SYSTEMS:  He denies any chest pain or shortness of breath.   I reviewed with the patient today the operative procedure for  endovascular stent graft repair.  I have told him we may consider trying  to do this percutaneously otherwise we will do this through two small  groin incisions.  He understands and agrees to proceed.  He has a  cardiology evaluation scheduled with [facility] on Monday 08/15.  We have  scheduled his endovascular stent graft repair with a Gore excluder graft  on 11/16/2009.     Janetta Hora. Fields, MD  Electronically Signed   CEF/MEDQ  D:  10/20/2009  T:  10/20/2009  Job:  [id number]   cc:   Sigmund Hazel, M.D.  Kathrin Penner. Vear Clock, M.D.  Nelda Severe, MD

## 2010-07-26 NOTE — Procedures (Signed)
Michael Lutz, Michael Lutz                ACCOUNT NO.:  [id number]   MEDICAL RECORD NO.:  [id number]         PATIENT TYPE:  HREC   LOCATION:                                 FACILITY:   PHYSICIAN:  Erick Colace, M.D.DATE OF BIRTH:  [birth year]   DATE OF PROCEDURE:  DATE OF DISCHARGE:                               OPERATIVE REPORT   This is a caudal epidural steroid injection.   INDICATION:  Lumbar radiculitis, prior history of lumbar laminectomy  approximately 2 years ago.   Informed consent was obtained after describing risks and benefits of the  procedure with the patient.  These include bleeding, bruising,  infection, loss of bowel or bladder function.  He elects to proceed and  has given written consent.  The patient was placed prone on the  fluoroscopy table.  Betadine prep, sterile drape.  A 25-gauge 1-1/2-inch  needle was used to anesthetize the skin and subcu tissue, 1% lidocaine  x2 mL, then 22-gauge 3-1/2-inch spinal needle was inserted under  fluoroscopic guidance into the sacral hiatus.  AP and lateral technique  was utilized.  Once needle location was confirmed on fluoroscopic  imaging, then Omnipaque 180 x 2 mL was injected with a good Christmas  tree pattern visualized followed by injection of 2 mL of 40 mg/mL Depo-  Medrol, 2 mL of 1% MPF lidocaine, and 2 mL of 0.9 normal saline sterile  preservative-free.  The patient tolerated the procedure well.  Post-  injection instructions were given.      Erick Colace, M.D.  Electronically Signed     AEK/MEDQ  D:  11/21/2007 09:18:10  T:  11/21/2007 21:45:55  Job:  [id number]

## 2010-07-26 NOTE — Assessment & Plan Note (Signed)
A 65 year old male with back pain since 2003, 2004, at 1 point had left  lower extremity pain, underwent left L4 laminectomy on October 17, 2005  per Dr. Alveda Reasons, small epidural hematoma postop, and no other  complications.  Manage medically for axial back pain as well as some hip  pain radiation pattern.  I last saw him approximately 1 month ago.  He  has had some increasing left lower extremity pain at night mainly in the  thigh area, has failed L3-4 transforaminal injection in July as well as  L2-3.   Despite his pain, he has continued to work 40 hours a week at [employer]  [place].   Current pain is 3, average pain is 8 described as stabbing, tingling,  aching.  He does have some pain into the right foot as well at times.  He can walk 10 minutes at a time, but no longer uses his treadmill for  exercise and unfortunately has gained some weight.  His pain does  interfere with activity at a 5/10 level.  He also has some tingling,  trouble walking, spasms.   His review of systems, otherwise negative.   PHYSICAL EXAMINATION:  VITAL SIGNS:  His blood pressure 154/98, pulse  98, O2 sat 95% on room air.  GENERAL:  No acute stress.  Mood and affect appropriate.  Gait is  normal.  He is unable to do a squat while holding on to the exam table.  He has normal strength, bilateral hip flexion, knee extensor, ankle  dorsiflexor.  His sensation is normal on left.  He has normal deep  tendon reflexes bilaterally.  His back range of motion is 75% range with  flexion and extension.   IMPRESSION:  Neurogenic claudication symptoms in a patient with lumbar  post laminectomy syndrome.  He does have an magnetic resonance imaging  scheduled at Dr. Toni Arthurs office next week to further evaluate.  As noted  above, he has failed epidural steroid injections.   We will go ahead and start him on some Neurontin 100 mg nightly and  slowly titrate up.  He is on multiple psychiatric medications including  Abilify,  chlordiazepoxide, Seroquel, and Wellbutrin.  I warned him up  potential increase depression side effect to discontinue and notify us  if this occurs.      Erick Colace, M.D.  Electronically Signed     AEK/MedQ  D:  04/14/2008 09:05:08  T:  04/14/2008 23:29:38  Job #:  [id number]   cc:   Nelda Severe, MD  Fax: [phone]

## 2010-07-26 NOTE — Assessment & Plan Note (Signed)
REASON FOR VISIT:  The patient returns today.  He had bilateral L5  dorsal ramus injections, bilateral L4 medial branch block, bilateral L3  medial branch block under fluoroscopic guidance on December 31, 2006.  He  did have some reduction in pain, but no approaching the 50% level.  He  did have previous injection improving pain to that level.  His main  problem is morning pain.  He takes 15 mg of MS Contin at night and this  gives him some relief, but still wakes up about 4 hours into the evening  with back pain.  He takes about two Aleve in the morning.  This does  help keep him from getting to tight in the morning and allows him to  loosen up.  He does some stretching exercises at home and he has not had  any physical therapy.   His pain level is about a 1/10 currently, 4/10 in the mornings.   PHYSICAL EXAMINATION:  VITAL SIGNS:  Blood pressure 162/98, pulse 84,  respirations 16, O2 saturations 100% on room air.  He does take  medications for his blood pressure.  He is a bit anxious right now in  regards to injection.  BACK:  His back has no tenderness in the lumbosacral junction and no  scar tenderness over the midline lumbar scar.  He has tenderness over  the left gluteus medius area.  EXTREMITIES:  Lower extremity strength was good.  His ambulation and  gait shows no evidence of toe drag or knee instability.   IMPRESSION:  1. Lumbar post laminectomy syndrome.  2. Lumbar axial pain as well as in the gluteus medius area.  It      appears to be myofascial and not clearly facet related.   PLAN:  1. Will do trigger point injections today.  2. Increase MS Contin to 30 mg nightly.  3. Physical therapy discussed.  He really does not want to do it this      year because of insurance reasons, but is willing to do it in      January.  I do think this will help him overall with some core      strengthening and mobilization.      Erick Colace, M.D.  Electronically  Signed     AEK/MedQ  D:  01/28/2007 15:15:21  T:  01/29/2007 09:40:01  Job #:  [id number]   cc:   Nelda Severe, MD  Fax: [phone]   Lubertha Basque. Jerl Santos, M.D.  Fax: [phone]

## 2010-07-26 NOTE — Procedures (Signed)
Michael Lutz, Michael Lutz                ACCOUNT NO.:  [id number]   MEDICAL RECORD NO.:  [id number]           PATIENT TYPE:   LOCATION:                                 FACILITY:   PHYSICIAN:  Erick Colace, M.D.DATE OF BIRTH:  06/25/1945   DATE OF PROCEDURE:  DATE OF DISCHARGE:                               OPERATIVE REPORT   PROCEDURE:  Left L5 dorsal ramus injection, left L4 medial branch block,  left L3 medial branch block.   INDICATIONS:  Back pain, requested procedure by Dr. Alveda Reasons.   Had previously attempted L5 transforaminal but because of foraminal  stenosis could not gain access to intervertebral foramen.   Informed consent was obtained after describing risks and benefits of the  procedure with the patient.  These include bleeding, bruising,  infection.  He elected to proceed and has given written consent.   The patient placed prone on fluoroscopy table.  Betadine prep, sterilely  draped.  A 25-gauge, 1-1/2-inch needle was used to anesthetize the skin  and subcu tissue, 1% lidocaine x2 mL at each of three sites.  Then, a 22-  gauge, 3-1/2-inch spinal needle was inserted under fluoroscopic guidance  first targeting the left S1 SAP sacral junction.  Bone contact made,  confirmed with lateral imaging.  Omnipaque 180 x 0.5 mL demonstrated no  intravascular uptake and 0.5 mL dexamethasone lidocaine solution was  injected.  The left L4 SAP transverse process junction targeted.  Bone  contact made, confirmed with lateral imaging.  Omnipaque 180 under live  fluoro demonstrated no intravascular uptake and 0.5 mL dexamethasone  lidocaine solution was injected.  The left L4 SAP transverse process  junction targeted.  Bone contact made, confirmed with lateral imaging.  Omnipaque 180 under live fluoro demonstrated no intravascular uptake and  0.5 mL of dexamethasone lidocaine solution was injected.  The patient  tolerated the procedure well.  Pre and post injection vitals stable.  Post injection instructions given.      Erick Colace, M.D.  Electronically Signed     AEK/MEDQ  D:  05/12/2008 18:05:40  T:  05/13/2008 05:06:46  Job:  [id number]

## 2010-07-26 NOTE — Assessment & Plan Note (Signed)
This is a followup visit for low back pain and lumbar facet syndrome.  Date of last visit was 2 months ago.  He had a good result with caudal  epidural injection.  He had a great result for a couple of days, but  then he had recurrence of this usual pain.  He has had some increasing  right hip pain in addition to his left-sided pain that was down his hip  and thigh.  He was trialed on Robaxin last visit.  He actually likes  Flexeril better.  He sleeps 7 hours through the night when he takes 2  hydrocodones, but only sleep about 5 or 6 when he takes one, and he  wakes up and takes the second one.  He works from noon to 9 p.m.   His average pain is 4/10, currently 2, this is with medication.  He  drives.  He climbs steps.  He walks 20 minutes at a time.  He works 40  hours a week.   His Oswestry disability score today 28%.   PHYSICAL EXAMINATION:  VITAL SIGNS:  Blood pressure 149/104, but  rechecked at 157/95, pulse 91, and O2 sat 99% on room air.  GENERAL:  Orientation x3.  Affect is alert.  Gait is normal.  Sensation  is normal in the lower extremities.  He has good pulses in the lower  extremities.  No evidence of intrinsic atrophy.  No evidence of skin  discoloration.   His hip on the right side has reduced internal rotation compared to the  left side.  There is no pain over the trochanteric bursa.  He has good  lumbar forward flexion and extension with little pain at this point.   IMPRESSION:  1. Lumbar pain.  2. History of lumbar facet syndrome.  3. History of radiculitis with some neurogenic claudication-type      symptoms.   PLAN:  We will continue his hydrocodone 2 nightly with trial of Amrix 15  mg 1-2 p.o. daily.   See him back in 2 months.  Discussed the possibility of spinal cord  stimulation.  We would send him back to Dr. Alveda Reasons for that.      Erick Colace, M.D.  Electronically Signed     AEK/MedQ  D:  01/16/2008 09:30:14  T:  01/16/2008 22:10:28   Job #:  [id number]   cc:   Nelda Severe, MD  Fax: [phone]

## 2010-07-26 NOTE — Procedures (Signed)
Michael Lutz, Michael Lutz                ACCOUNT NO.:  [id number]   MEDICAL RECORD NO.:  [id number]          PATIENT TYPE:  REC   LOCATION:  [facility]                          FACILITY:  [facility]   PHYSICIAN:  Erick Colace, M.D.DATE OF BIRTH:  1945-07-26   DATE OF PROCEDURE:  DATE OF DISCHARGE:                               OPERATIVE REPORT   PROCEDURE:  Left L2-L3 transforaminal lumbar epidural steroid injection  under fluoroscopic guidance.   INDICATIONS:  Lumbar stenosis left greater than right and has L2-L3 disk  height loss.  He has hip pain and thigh pain, which did not respond  adequately to an L3 injection.  This pain is only partially relieved by  medication management and other conservative care interferes with  household duties including gardening activities.   Informed consent obtained after describing the risks and benefits of the  procedure with the patient.  We reviewed the anatomy on the spine model  as well as dermatomal charts.  These include bleeding, bruising,  infection, and temporary or permanent paralysis.  He elected to proceed  and was given written consent.  The patient placed prone on fluoroscopy  table.  Betadine prep and sterilely draped.  A 25-gauge, 1-1/2-inch  needle was used to anesthetize the skin and subcutaneous tissue, 1%  lidocaine x2 mL and a 22-gauge, 3-1/2-inch spinal needle was inserted  under fluoroscopic guidance.  Turning the left L2-L3 intervertebral  foramen AP, lateral, and oblique imaging utilized Omnipaque 180 x0.5 mL  demonstrated no intravascular uptake and 1 mL of 10 mg per mL  dexamethasone 2 mL of  1% MPF lidocaine were injected after negative  drawback for blood through IV extension tubing as well.  The patient  tolerated the procedure well.  Pre and post injection vitals stable.  Post injection instructions given.  We will continue his hydrocodone.  Monitor pain response and went from 6 to 5.  We will monitor his  response over time.   If this is not particularly successful, then we  would go with a caudal approach.      Erick Colace, M.D.  Electronically Signed     AEK/MEDQ  D:  10/28/2007 09:00:08  T:  10/28/2007 23:01:11  Job:  [id number]

## 2010-07-26 NOTE — Assessment & Plan Note (Signed)
OFFICE VISIT   MIHRAN, LEBARRON  DOB:  03-02-1946                                       12/09/2009  ZOXWR#:[id number]   The patient returns for follow-up today.  He had a Gore excluder stent  graft placed on November 16, 2009.  This was done percutaneously.  He is  in the preoperative staging workup for consideration of an additional  back operation.  He currently denies any abdominal or back pain.  He  denies any chest pain or shortness of breath.  He had a CT angiogram  today which I reviewed and reviewed all the images.  This shows no  evidence of endoleak.  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   PHYSICAL EXAM:  Blood pressure 152/92 in the left arm, heart rate 73 and  regular and temperature is 97.9.  HEENT:  Unremarkable.  Cardiac exam is  regular rate and rhythm without murmur.  Abdomen:  Soft, nontender,  nondistended.  No pulsatile mass.  Extremities:  He has 2+ femoral and  posterior tibial pulses bilaterally.  He had bilateral ABIs performed  today which I reviewed and interpreted which were greater than 1.  Skin:  Has no open ulcers or rashes.   The patient is doing well overall.  His aneurysm is well excluded by his  stent graft.  He will follow up in five months' time for repeat CT  angiogram.  Dr. Edilia Bo phoned me today regarding his back operation.  Apparently this had been scheduled as an ALIF procedure.  I agree with  Dr. Edilia Bo that an ALIF is probably not in the patient's best interest  as the aneurysm sac is going to be in the way of the spine exposure as  well as the possibility of kinking the stent with the extensive  mobilization of the iliacs that is necessary.  I believe the only option  if an additional back operation is considered would be a posterior  approach.     Janetta Hora. Fields,  MD  Electronically Signed   CEF/MEDQ  D:  12/09/2009  T:  12/10/2009  Job:  [id number]   cc:   Nelda Severe, MD  Di Kindle. Edilia Bo, M.D.  [facility]

## 2010-07-26 NOTE — Assessment & Plan Note (Signed)
OFFICE VISIT   SANCHEZ, HEMMER  DOB:  1946-02-13                                       09/29/2009  ZOXWR#:[id number]   The patient returns for followup today.  He was last seen on 09/08/2009.  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  He returns today with some anxiety about the size of the  aneurysm and wishes to have further discussions on whether or not  operative repair should be considered.   He denies any changes in abdominal pain or back pain.   On physical exam today blood pressure is 154/95 in the right arm, heart  rate is 88 and regular.  Oxygen saturation is 99%.  Abdomen soft,  nontender.   I had a lengthy discussion with the patient again today about risk of  rupture for a 4.6 to 4.7 cm aneurysm.  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  Most likely he would  require open repair to fix this.  I also discussed with him the fact  that he is not a very good stent graft candidate due to the tortuosity  of his iliac arteries.  I reassured him that again the risk of rupture  of an aneurysm this size should be fairly low.  However, I believe he  does warrant close followup to make sure the aneurysm is not expanding  over time.  He will return for a repeat ultrasound in October of 2011.  At that time we will discuss again whether or not to repair his  aneurysm; if he still has significant concerns over anxiety at that  point or in the interim period we will consider operative repair.  If  the aneurysm has grown at the time of his next ultrasound we would also  consider operative repair.  All this was discussed with the patient  today.     Janetta Hora. Fields, MD  Electronically Signed   CEF/MEDQ  D:  09/29/2009  T:  09/30/2009  Job:  [id number]

## 2010-07-26 NOTE — Assessment & Plan Note (Signed)
Michael Lutz returns today.  I last saw him on February 25, 2007.  He has  basically been off his MS Contin for about 5 months.  He has had  hydrocodone from Dr. Toni Arthurs office that he had previously, and from  other surgeries that has really held him until that time.  He has just  been taking it at night.  He was taking 10 mg at a time at night, but  this did not quite hold him as well as the morphine.  He is back because  his other physicians wanted him to come back and get re-evaluated.  He  takes diclofenac about every other day and Flexeril at night as well.   Pain is about 4-7/10, left lower extremity, interferes with activity in  a moderate level.  Sleep is fair.  Pacing activities and medications  help with pain.  At 6/10 level, he can walk 30 minutes and at times  climbs steps.  He drives.  He works 40 hours a week at [employer] [place].  He has spasm in his low back, depression, and anxiety.   PHYSICAL EXAMINATION:  Blood pressure 136/95, pulse 87, respirations 18,  and O2 saturations 95% on room air.  Well-developed and well-nourished  male in no acute distress.  Orientation x3.  Affect is bright and alert.  Gait is normal.  His deep tendon reflexes are 2+ in the bilateral knees  and ankles.  He has good lower extremity strength, and range of motion.  Gait shows no evidence of knee instability.  BACK:  Some tenderness to palpation in the lumbar area and lumbosacral  junction.   I did review a prior MRI.  He has had a surgery at L4-L5 level.  He does  have increased degeneration at L3-L4 level, left side greater than right  side.  This correlates with his symptomatology.   PLAN:  1. We will send him up for L3-L4 transforaminal injection under      fluoroscopic guidance.  2  I will write for his hydrocodone today 7.5/325 mg 2 p.o. at bedtime  #60 for one month.  He is not to take medications from any other sources  at this time.  He states he is just about out of his hydrocodone  from  his other physicians.      Erick Colace, M.D.  Electronically Signed     AEK/MedQ  D:  08/27/2007 17:05:33  T:  08/28/2007 10:19:48  Job #:  [id number]   cc:   Nelda Severe, MD  Fax: [phone]   Vikki Ports, M.D.  Fax: [phone]

## 2010-07-26 NOTE — Procedures (Signed)
Michael Lutz, Michael Lutz                ACCOUNT NO.:  [id number]   MEDICAL RECORD NO.:  [id number]          PATIENT TYPE:  REC   LOCATION:  [facility]                          FACILITY:  [facility]   PHYSICIAN:  Erick Colace, M.D.DATE OF BIRTH:  Apr 08, 1945   DATE OF PROCEDURE:  DATE OF DISCHARGE:                               OPERATIVE REPORT   PROCEDURE PERFORMED:  Trigger point injection left gluteus medius.   INDICATIONS:  Left buttock pain only partially responsive to medication  management.  No clear signs of SI dysfunction.  Pain is only partially  response to medication management.   Informed consent was obtained describing risks and benefits of the  procedure to the patient.  These include bleeding, bruising, infection.  He elects to proceed and has given written consent.  The patient placed  prone on fluoroscopy table.  Betadine prep, sterile drape, 25-gauge inch  and a half needle was used to anesthetize the skin and subcu tissue.  1%  lidocaine x2 mL.  No postprocedure complications.  Post injection  instructions given.      Erick Colace, M.D.  Electronically Signed     AEK/MEDQ  D:  01/28/2007 15:11:33  T:  01/29/2007 09:10:11  Job:  [id number]

## 2010-07-26 NOTE — Procedures (Signed)
NAMETILDEN, Michael Lutz                ACCOUNT NO.:  [id number]   MEDICAL RECORD NO.:  [id number]          PATIENT TYPE:  REC   LOCATION:  [facility]                          FACILITY:  [facility]   PHYSICIAN:  Erick Colace, M.D.DATE OF BIRTH:  [birth year]   DATE OF PROCEDURE:  DATE OF DISCHARGE:                               OPERATIVE REPORT   PROCEDURE:  Left L3-4 transforaminal lumbar epidural steroid injection  under fluoroscopic guidance.   INDICATIONS:  Lumbar stenosis, left greater than right, with L3 nerve  root impingement and left lower extremity thigh pain only partially  relieved by medication management and other conservative care,  interfering with household duties including gardening activities.   Informed consent was obtained after describing risks and benefits of the  procedure to the patient.  These include bleeding, bruising, infection,  temporary or permanent paralysis.  He elected to proceed and has given  written consent.  The patient was placed prone on fluoroscopy table.  Betadine prep, sterilely draped.  A 25-gauge, 1.5-inch needle was used  to anesthetize skin and subcu tissue with 1% lidocaine x2 mL.  A 22-  gauge, 3.5-inch spinal needle was inserted under fluoroscopic guidance  targeting left L3-4 intervertebral foramen.  AP, lateral, and oblique  imaging utilized.  Omnipaque 180 x 0.5 mL demonstrated no intravascular  uptake and then 1 mL of 10 mg/mL solution of dexamethasone and 2 mL of  1% MPF lidocaine were injected after negative drawback for blood.  The  patient tolerated the procedure well.  Pre- and post-injection vitals  stable.  Post-injection instructions given.  I will see him back in  approximately 1 month to assess efficacy of injection or make decision  for a repeat injection if need be.   We reviewed medications.  We will increase his hydrocodone 10/325 to  nightly to see if this gives him longer duration of sleep.      Erick Colace,  M.D.  Electronically Signed     AEK/MEDQ  D:  09/23/2007 14:01:14  T:  09/24/2007 01:22:48  Job:  [id number]   cc:   Vikki Ports, M.D.  Fax: [phone]

## 2010-07-26 NOTE — Assessment & Plan Note (Signed)
Mr. Nocera returns today.  He was last seen by me May 12, 2008, at which  time, we did a left L5 dorsal ramus injection, left L4 medial branch  block, left L3 medial branch block under fluoroscopic guidance.  He  states he had a couple days of relief with pain in the left side of low  back; however, he reports that this has been the pattern with all his  injections including epidurals.  He did not feel that this is any  different response than his other epidural injections.  This included  the caudal out as well as left L2-L3 transforaminal as well as a left L3-  L4 transforaminal.  Previously attempted an L5-S1 transforaminal left,  but had excessive foraminal stenosis to gain access via that route.   He returns today saying that he actually feels better since starting the  gabapentin.  His right lower extremity pain has improved.  His walking  tolerance has increased from about 10 minutes to about 18 minutes.  He  states that his pharmacy filled his prescription of oxycodone, but he  does not have his pills with him today.  I wrote for hydrocodone 5/325  in the morning.  His other complaint is poor sleep.  He states that he  gets about 4 or 5 hours with the MS Contin before he has to wake up.   He is employed 40 hours a week.   His review of systems is positive for weakness, numbness, tingling,  trouble walking, and spasms.   His blood pressure is 149/83, pulse 79, respiratory rate is 18, and O2  sat is 96% room air.  In general no acute distress.  Orientation x3.   His gait is normal.  His back has some mild tenderness to palpation  lumbosacral junction.  He has 75% range forward flexion and 3% extension  of the lumbar spine.   Extremities without edema.  Lower extremity strength is good.   IMPRESSION:  1. Lumbar pain difficult to localize his response to epidural      injections about the same as the medial branch blocks and basically      a temporary about 2-day relief then  reoccurrence.  I explained the      patient given no clear-cut differentiation between his response to      epidural versus medial branch blocks, I would not repeat at this      point given that it is only a short-term relief.  2. His lower extremity radicular pain improved with gabapentin.  We      will continue.  He just increased it to q.i.d. at 300 mg.  We will      do that for another month and consider increasing to 400.  3. In terms of his poor sleep, we will try Kadian 30 mg at bedtime in      place of MS Contin CR 30 mg.  We will continue his hydrocodone      5/325 q.a.m. and have him bring his pills next month.      Erick Colace, M.D.  Electronically Signed     AEK/MedQ  D:  06/15/2008 10:41:13  T:  06/15/2008 23:35:37  Job #:  [id number]

## 2010-07-29 NOTE — Discharge Summary (Signed)
NAMEWILKES, POTVIN NO.:  [id number]   MEDICAL RECORD NO.:  [id number]          PATIENT TYPE:  INP   LOCATION:  5035                         FACILITY:  [facility]   PHYSICIAN:  Nelda Severe, MD      DATE OF BIRTH:  [birth year]   DATE OF ADMISSION:  10/17/2005  DATE OF DISCHARGE:  10/21/2005                                 DISCHARGE SUMMARY   HOSPITAL COURSE:  This man was admitted for management of L4-5 disk  herniation and spinal stenosis.  On the day of admission, he was taken to  the operating room where a left L4-5 laminectomy, partial facetectomy, and  discotomy were performed.  There was a small dural leak, which was patched  with DuraGen.   Postoperatively, the patient felt severe sciatic pain.  An MRI scan was  performed, which was done on the first postoperative day.  This showed a  hematoma/seroma under pressure.  Therefore, he was taken back to the  operating room on October 19, 2005 where reexploration laminectomy was  carried out.  Postoperative course was smooth.  His leg pain was much  improved and we were able to discontinue his drain on October 21, 2005, at  which time he was discharged.  He was given prescriptions for Norco 10,  ibuprofen, and ranitidine.   DIAGNOSIS:  Spinal stenosis and disk herniation at L4-5 status post  laminectomy, disk incision with secondary drainage of epidural hematoma, and  reexploration laminectomy.   CONDITION ON DISCHARGE:  Stable and ambulatory.   DISCHARGE MEDICATIONS:  See above.   FOLLOWUP ARRANGEMENTS:  Office in approximately 2 weeks.   SPECIAL INSTRUCTIONS:  No bending or lifting, to call me for any drainage or  fever.      Nelda Severe, MD  Electronically Signed     MT/MEDQ  D:  12/12/2005  T:  12/13/2005  Job:  [id number]

## 2010-07-29 NOTE — Op Note (Signed)
Michael Lutz, Michael Lutz                            ACCOUNT NO.:  [id number]   MEDICAL RECORD NO.:  [id number]                   PATIENT TYPE:  AMB   LOCATION:  DSC                                  FACILITY:  [facility]   PHYSICIAN:  Lubertha Basque. Jerl Santos, M.D.             DATE OF BIRTH:  09/25/1945   DATE OF PROCEDURE:  10/27/2003  DATE OF DISCHARGE:                                 OPERATIVE REPORT   PREOPERATIVE DIAGNOSIS:  Right foot metatarsalgia.   POSTOPERATIVE DIAGNOSIS:  Right foot metatarsalgia.   PROCEDURE:  1. Right foot fourth metatarsal head excision.  2. Right foot fifth metatarsal head excision.   ANESTHESIA:  General.   ATTENDING SURGEON:  Lubertha Basque. Jerl Santos, M.D.   ASSISTANT:  Lindwood Qua, P.A.   INDICATION FOR PROCEDURE:  The patient is a 65 year old man status post a  number of right foot procedures.  He is most recently status post third  metatarsal head excision for a large callous and metatarsalgia in that area.  That has done fairly well but he has been left with transfer metatarsalgia  to the fourth and fifth areas.  He continues with a great deal of discomfort  and wishes to have this addressed as well.  Fourth and fifth metatarsal head  excisions were discussed with the patient and informed operative consent was  obtained after discussion of possible complications, reaction to anesthesia,  infection, neurovascular injury, and the possibility of transfer  metatarsalgia over to the medial border of the foot.  He also was made to  understand that he is never going to have a normal foot despite surgical  intervention, but our hope is to make this a painless extremity in which he  can exercise and walk.   DESCRIPTION OF PROCEDURE:  The patient was taken to the operating suite  where a general anesthetic was applied without difficulty.  He was  positioned supine and prepped and draped in normal sterile fashion.  After  administration of D5 and IV antibiotics, the  right leg was elevated,  exsanguinated, a tourniquet inflated about the calf.  An incision was made  in the fourth web space with dissection down to the fourth metatarsal head.  A longitudinal incision was made just lateral to the extensor mechanism.  The metatarsal head was exposed.  A cut was made at the metatarsal neck  level with an oscillating saw followed by removal of the metatarsal head  itself.  A similar procedure was done for the fifth metatarsal head.  I then  did expose the base of the fourth toe.  I excised a portion of the proximal  phalanx in hopes of shortening this extremely long toe to bring it more in  line with the toes on either side.  I then used .045 K-wires and passed  these out retrograde through the fourth and fifth toes and then anterograde  into the fourth and  fifth metatarsals.  Fluoroscopy was used to confirm  adequate placement of the hardware and excision of the metatarsal head  __________ myself to make appropriate intraoperative decisions.  The wounds  were irrigated.  The tourniquet was deflated and toes became pink more  medially.  A small amount of bleeding was easily controlled with Bovie  cautery.  The skin was reapproximated with nylon followed by Adaptic and a  dry gauze dressing with a loose Ace wrap.  Estimated blood loss and  intraoperative fluid was obtained from the anesthesia record and accurate  tourniquet time.   DISPOSITION:  The patient was extubated in the operating room and taken to  the recovery room in stable condition.  Plans were for him to go home the  same day and followup in the office in one week.  I will contact him by  phone tonight.                                               Lubertha Basque Jerl Santos, M.D.    PGD/MEDQ  D:  10/27/2003  T:  10/27/2003  Job:  [id number]

## 2010-07-29 NOTE — Op Note (Signed)
NAMEDATHAN, Michael Lutz NO.:  [id number]   MEDICAL RECORD NO.:  [id number]          PATIENT TYPE:  INP   LOCATION:  5035                         FACILITY:  [facility]   PHYSICIAN:  Nelda Severe, MD      DATE OF BIRTH:  05-08-1945   DATE OF PROCEDURE:  10/19/2005  DATE OF DISCHARGE:                                 OPERATIVE REPORT   SURGEON:  Dr. Nelda Severe.   ASSISTANT:  OR staff.   PREOPERATIVE DIAGNOSIS:  Epidural hematoma status post left L4 laminectomy.   POSTOPERATIVE DIAGNOSIS:  Epidural hematoma status post left L4 laminectomy.   OPERATIVE PROCEDURE:  Re-exploration laminectomy, evacuation of hematoma.   INDICATIONS FOR SURGERY:  The patient had recurrent left sciatic pain very  shortly after he awakened from his surgery 2 days ago.  MRI scan was done  which showed flattening of the cauda equina and evidence of pressure.  Because of severe pain, I advised him that we should evacuate the hematoma  and perform a re-exploration laminectomy.   The patient was given a gram of Ancef intravenously.  He was positioned  prone on the [place] frame, after general and endotracheal intubation and  anesthetic induction.  Upper extremities were positioned to avoid  hyperflexion and abduction of the shoulders and hyperflexion the elbows.  Upper extremities were padded with foam from hands to axillae.  The knees,  shins and ankles were padded with pillows and foam.   The lumbar area was prepared with DuraPrep and draped in rectangular  fashion.  The drapes were secured with Ioban.  The previous midline incision  was excised in elliptical fashion, so as to dispose of the relatively recent  incision edges which presumably are at this point colonized.  Remaining  stitches in the subcutaneous layer were removed, and the subcutaneous layer  irrigated out with normal saline.  We then removed the interrupted sutures  from the fascia and spread the fascia.  I then used a Cobb  elevator to  retract the muscle and fascia to the left side, and placed a Taylor  retractor lateral to the L4-5 apophyseal joint.   There was organized clot in the laminectomy defect, which was irrigated and  suctioned out.  The piece of previously placed Duragen was also removed.  The underlying arachnoid bubble was intact.   I then proceeded to enlarge the laminectomy circumferentially, proximally,  laterally, medially and distally.  This was quite arduous as the patient is  very large and the wound was very deep.  However, I felt that this was  necessary in view of the fact there was some postop swelling and we needed  to enlarge the laminectomy to further decompress the nerve root and cauda  equina.  The disk space was further incised and a very small fragment of  disk removed.  There were no free fragments of disk in the canal, as noted  previously.  When I felt that there was no more decompression to be  performed, which included the L4-5 neural foramen as well as the L5 lateral  recess distally,  the wound was thoroughly irrigated.  Small pieces of  Gelfoam were placed in the epidural space and left for about 5-10 minutes  and then removed.  There was virtually no active bleeding.   A piece of DuraGen, approximately 2 cm square, was placed over the lateral  aspect of the dura, covering the arachnoid bubble on the lateral aspect of  the dura, above the origin of the L5 nerve root, as previously described.   We then placed a 7-mm, Jackson-Pratt drain subfascially.  I then closed the  fascia using interrupted 0-0 Vicryl suture.  The subcutaneous layer was  closed using 2-0 interrupted Vicryl suture.  The skin was closed using a  subcuticular 3-0 running suture, undyed.  A nonadherent triple antibiotic  dressing was applied, secured with OpSite.  Prior to placing the dressing  the Jackson-Pratt drain was secured with a nylon suture in basket weave  fashion.   Blood loss estimated  about 100 mL.  There were no intraoperative  complications.   In the recovery room the patient was awake and able to actively dorsiflex  and plantar flex both feet and ankles.  He reported not feeling any left leg  pain.      Nelda Severe, MD  Electronically Signed     MT/MEDQ  D:  10/19/2005  T:  10/19/2005  Job:  [id number]

## 2010-07-29 NOTE — Op Note (Signed)
NAMECURBY, CARSWELL NO.:  [id number]   MEDICAL RECORD NO.:  [id number]          PATIENT TYPE:  OIB   LOCATION:  2550                         FACILITY:  [facility]   PHYSICIAN:  Nelda Severe, MD      DATE OF BIRTH:  1945/09/17   DATE OF PROCEDURE:  10/17/2005  DATE OF DISCHARGE:                                 OPERATIVE REPORT   SURGEON:  Nelda Severe, MD   ASSISTANT:  OR Staff.   PREOPERATIVE DIAGNOSES:  L4-5 stenosis and left-sided disk herniation.   POSTOPERATIVE DIAGNOSES:  L4-5 stenosis and left-sided disk herniation.   OPERATIVE FINDINGS:  1. Severe facet hypertrophy, L4-5 left side.  2. Minimal L4-5 disk herniation.   OPERATIVE NOTE:  The patient was placed under general endotracheal  anesthesia.  Prophylactic vancomycin had been administered intravenously.  The patient was positioned prone on the [place] frame.  Care was taken to  position the upper extremities so as to avoid hyperflexion and abduction of  the shoulders, and so as to avoid hyperflexion of the elbows.  The upper  extremities were padded with foam from axilla to hands.  There was no  pressure whatsoever on the cubital tunnels.   The legs were padded with pillows at the thighs, knees, shins and feet; and  supported on the lower end of the frame.   The lumbar area was prepped with DuraPrep and draped in rectangular fashion.  The drapes were secured with Ioban.   A midline incision was made over the lower lumbar area into the dermis.  The  subcutaneous tissue and paraspinal fascia bilaterally was injected with a  mixture of 0.25% Marcaine with epinephrine and 1% lidocaine with  epinephrine.  The incision was then deepened down to the spinous processes.  The spinous process of what was perceived to be L5 was marked and a Kocher  placed on it.  A cross-table lateral radiograph showed that we were at the  anticipated level.   Paraspinal muscles reflected onto the left side at L4-5l and  a Taylor  retractor placed lateral to the L4-5 epiphyseal joint.  Soft tissues  curetted away from the interlaminar space.  There was obvious overgrowth of  the inferior articular process of L4.  After separating away the ligamentum  of flavum from the undersurface of the trailing edge of the L5 lamina and  the medial edge of the facet, a high-speed bur [facility] ) was used to  remove approximately the medial 30% of the inferior articular process and  about the distal third of the trailing edge of the lamina.  We burred  through to the surface of the superior articular process.  We carefully  separated more ligamentum of flavum and joint capsule away from the medial  edge of the superior articular surface.  Further burring was carried out.  We then completed the facetectomy using Kerrison rongeurs.  This was carried  out laterally in that the ligamentum of flavum lateral edge was free.  We  further released the ligamentum of flavum from the upper edge of the L5  lamina.   We were then able to visualize the L5 nerve root, which was still quite  tight; it was mobilized off the disk.  There was no free fragment.  Ultimately an incision was made in the disk in the outer fibers of the  annulus, but minimal tissue was delivered.  The major problem appeared to be  the stenosis associated with facet arthritis.  Actually a fairly extensive  lateral recess decompression and removal of some of the upper L5 lamina on  the left side resulted in a very good decompression.  A ball-tipped nerve  hook could then be fairly admitted into the L4-5 neural foramen and distally  around the medial pedicle of L5.   Very early on a very small dural tear occurred with underlying bulging  arachnoid, which had visually burst and leaked a very small amount of spinal  fluid into the wound.  This was proximal to the origin of the L5 nerve root  and on the lateral side of the dural sac.  I felt that in order to repair  it  with a stitch, I would have to sacrifice the facet joint for exposure.  Therefore, a piece of Duragen was placed over the bleb.  Valsalva maneuver  was carried out and there was no leak.   Following mobilization of the  nerve root we did coagulate some epidural  veins on the floor of the canal.  We subsequently bone waxed the laminotomy  edges to control bone bleeding.  The paraspinal muscles were coagulated at  numerous points to stop small bleeders.   By the end of the procedure, at the time of closure, there was really no  bleeding whatsoever.   The fascia was then closed using interrupted figure-of-eight #1 Vicryl  sutures in the fascia.  Subcutaneous layer was closed using interrupted 2-0  Vicryl sutures.  The skin was closed using a subcuticular 3-0 undyed Vicryl  in continuous fashion.  Skin edges were reinforced with Steri-Strips.  A  nonadherent antibiotic ointment dressing was applied and secured with  OpSite.   The patient returned to the recovery room in satisfactory condition, where  he was able to actually move both feet and ankle.      Nelda Severe, MD  Electronically Signed     MT/MEDQ  D:  10/17/2005  T:  10/17/2005  Job:  [id number]

## 2010-09-16 ENCOUNTER — Encounter: Payer: Self-pay | Admitting: Vascular Surgery

## 2010-10-25 ENCOUNTER — Other Ambulatory Visit: Payer: Self-pay | Admitting: Orthopedic Surgery

## 2010-10-25 ENCOUNTER — Encounter: Payer: Self-pay | Admitting: Vascular Surgery

## 2010-10-25 DIAGNOSIS — M545 Low back pain, unspecified: Secondary | ICD-10-CM

## 2010-10-25 DIAGNOSIS — M549 Dorsalgia, unspecified: Secondary | ICD-10-CM

## 2010-10-25 DIAGNOSIS — M79604 Pain in right leg: Secondary | ICD-10-CM

## 2010-10-31 ENCOUNTER — Ambulatory Visit
Admission: RE | Admit: 2010-10-31 | Discharge: 2010-10-31 | Disposition: A | Discharge status: Home / Self Care | Payer: Managed Care, Other (non HMO) | Source: Ambulatory Visit | Attending: Orthopedic Surgery | Admitting: Orthopedic Surgery

## 2010-10-31 DIAGNOSIS — M545 Low back pain, unspecified: Secondary | ICD-10-CM

## 2010-10-31 DIAGNOSIS — M549 Dorsalgia, unspecified: Secondary | ICD-10-CM

## 2010-10-31 DIAGNOSIS — M79604 Pain in right leg: Secondary | ICD-10-CM

## 2010-11-01 ENCOUNTER — Ambulatory Visit
Admission: RE | Admit: 2010-11-01 | Discharge: 2010-11-01 | Disposition: A | Discharge status: Home / Self Care | Payer: Managed Care, Other (non HMO) | Source: Ambulatory Visit | Attending: Orthopedic Surgery | Admitting: Orthopedic Surgery

## 2010-11-10 ENCOUNTER — Ambulatory Visit: Payer: Private Health Insurance - Indemnity | Admitting: Vascular Surgery

## 2010-12-14 ENCOUNTER — Encounter: Payer: Self-pay | Admitting: Vascular Surgery

## 2010-12-15 ENCOUNTER — Ambulatory Visit: Payer: Self-pay | Admitting: Vascular Surgery

## 2011-01-05 ENCOUNTER — Other Ambulatory Visit: Payer: Self-pay

## 2011-01-05 ENCOUNTER — Ambulatory Visit: Payer: Self-pay | Admitting: Vascular Surgery

## 2011-01-08 ENCOUNTER — Observation Stay (HOSPITAL_COMMUNITY)
Admission: EM | Admit: 2011-01-08 | Discharge: 2011-01-12 | DRG: 866 | Disposition: A | Discharge status: Home / Self Care | Payer: Managed Care, Other (non HMO) | Attending: Internal Medicine | Admitting: Internal Medicine

## 2011-01-08 DIAGNOSIS — M545 Low back pain, unspecified: Secondary | ICD-10-CM | POA: Diagnosis present

## 2011-01-08 DIAGNOSIS — G8929 Other chronic pain: Secondary | ICD-10-CM | POA: Diagnosis present

## 2011-01-08 DIAGNOSIS — I714 Abdominal aortic aneurysm, without rupture, unspecified: Secondary | ICD-10-CM | POA: Diagnosis present

## 2011-01-08 DIAGNOSIS — F319 Bipolar disorder, unspecified: Secondary | ICD-10-CM | POA: Diagnosis present

## 2011-01-08 DIAGNOSIS — IMO0002 Reserved for concepts with insufficient information to code with codable children: Secondary | ICD-10-CM | POA: Diagnosis present

## 2011-01-08 DIAGNOSIS — B028 Zoster with other complications: Principal | ICD-10-CM | POA: Diagnosis present

## 2011-01-08 DIAGNOSIS — E781 Pure hyperglyceridemia: Secondary | ICD-10-CM | POA: Diagnosis present

## 2011-01-08 DIAGNOSIS — I10 Essential (primary) hypertension: Secondary | ICD-10-CM | POA: Diagnosis present

## 2011-01-08 DIAGNOSIS — Z981 Arthrodesis status: Secondary | ICD-10-CM

## 2011-01-08 DIAGNOSIS — F17200 Nicotine dependence, unspecified, uncomplicated: Secondary | ICD-10-CM | POA: Diagnosis present

## 2011-01-09 ENCOUNTER — Inpatient Hospital Stay (HOSPITAL_COMMUNITY): Payer: Managed Care, Other (non HMO)

## 2011-01-09 ENCOUNTER — Emergency Department (HOSPITAL_COMMUNITY): Payer: Managed Care, Other (non HMO)

## 2011-01-09 ENCOUNTER — Encounter (HOSPITAL_COMMUNITY): Payer: Self-pay

## 2011-01-09 LAB — BASIC METABOLIC PANEL
BUN: 21 mg/dL (ref 6–23)
CO2: 31 mEq/L (ref 19–32)
Calcium: 9.4 mg/dL (ref 8.4–10.5)
Chloride: 99 mEq/L (ref 96–112)
Creatinine, Ser: 0.86 mg/dL (ref 0.50–1.35)
GFR calc Af Amer: >90 mL/min (ref >90)
GFR calc non Af Amer: 90 mL/min — ABNORMAL LOW (ref >90)
Glucose, Bld: 84 mg/dL (ref 70–99)
Potassium: 4.4 mEq/L (ref 3.5–5.1)
Sodium: 137 mEq/L (ref 135–145)

## 2011-01-09 LAB — DIFFERENTIAL
Basophils Absolute: 0 10*3/uL (ref 0.0–0.1)
Basophils Relative: 0 % (ref 0–1)
Eosinophils Absolute: 0.3 10*3/uL (ref 0.0–0.7)
Eosinophils Relative: 3 % (ref 0–5)
Lymphocytes Relative: 14 % (ref 12–46)
Lymphs Abs: 1.5 10*3/uL (ref 0.7–4.0)
Monocytes Absolute: 0.8 10*3/uL (ref 0.1–1.0)
Monocytes Relative: 7 % (ref 3–12)
Neutro Abs: 8.2 10*3/uL — ABNORMAL HIGH (ref 1.7–7.7)
Neutrophils Relative %: 76 % (ref 43–77)

## 2011-01-09 LAB — PROTIME-INR
INR: 0.96 (ref 0.00–1.49)
Prothrombin Time: 13 seconds (ref 11.6–15.2)

## 2011-01-09 LAB — CBC
HCT: 42.8 % (ref 39.0–52.0)
Hemoglobin: 14 g/dL (ref 13.0–17.0)
MCH: 28.2 pg (ref 26.0–34.0)
MCHC: 32.7 g/dL (ref 30.0–36.0)
MCV: 86.1 fL (ref 78.0–100.0)
Platelets: 169 10*3/uL (ref 150–400)
RBC: 4.97 MIL/uL (ref 4.22–5.81)
RDW: 15.3 % (ref 11.5–15.5)
WBC: 10.8 10*3/uL — ABNORMAL HIGH (ref 4.0–10.5)

## 2011-01-09 NOTE — H&P (Signed)
NAMELEMOND, GRIFFEE NO.:  [id number]  MEDICAL RECORD NO.:  [id number]  LOCATION:  WLED                         FACILITY:  [facility]  PHYSICIAN:  Gery Pray, MD      DATE OF BIRTH:  03/12/1946  DATE OF ADMISSION:  01/08/2011 DATE OF DISCHARGE:                             HISTORY & PHYSICAL   PRIMARY CARE PHYSICIAN:  Sigmund Hazel, M.D.  CODE STATUS:  Full code.  Patient goes to team 2.  CHIEF COMPLAINT:  Blister/cellulitis to right forearm.  HISTORY OF PRESENT ILLNESS:  This is a 65 year old gentleman who states on Tuesday he had the 'flu'.  He states he developed very high fevers.  He became delirious and lost approximately 30 hours of time due to the fever.  When he finally became conscious, it was Wednesday 4 p.m.  He noted very small dots on the right forearm.  He states they are probably around 40 small dots.  By the next day, the arm became inflamed and cellulitic and the few dots that blossomed into what looked like probably 100 dots.  He started developing erythematous streaking going up the right forearm.  He went and saw his GP on Thursday who thought he had shingles, gave him acyclovir and a flu shot.  On Friday, the dots went down on a good portion of the arm; however, they had clustered in one particular inflamed portion of the forearm.  Yesterday, he noted that he had a small pustular area.  This a.m., he woke up at the area was more inflamed, and over the course of the day he has developed 3 pustular areas that have grown in size, one of which has ruptured  draineding serosanguineous fluids.  He states he has not had any more fevers since Tuesday.  No chills, no nausea, vomiting.  No significant abdominal pain.  No diarrhea, no burning on urination.  No further altered mental status.  He states he does not have any history of a skin infection or any history of staph infections.  He finally came to the ER because the erythema was getting larger  and the pustular areas were also getting larger.  In addition to that, on the right hand, on the index finger, he has had a small, clean based ulcer, which he states he has been present for weeks and has not been healing.  History obtained from the patient.  REVIEW OF SYSTEMS:  All 10-point systems reviewed and negative except as noted in HPI.  PAST MEDICAL HISTORY:  Includes: 1. AAA. 2. Hypertension. 3. Bipolar disease. 4. Chronic back pain. 5. Hypertriglyceridemia.  PAST SURGICAL HISTORY: 1. Cholecystectomy. 2. Stenting of the AAA. 3. Spinal fusions.  MEDICATIONS: 1. Seroquel 100 mg every evening. 2. Lamotrigine 200 mg daily. 3. Clonazepam 1 mg in the evening. 4. Trazodone 100 mg q. evening. 5. Effexor XR 150 mg daily. 6. Nuvigil 150 mg daily. 7. Norvasc 5 mg daily. 8. Quinapril 40 mg twice daily. 9. Lipitor 80 mg daily. 10.Lasix 40 mg twice daily. 11.Oxycodone 30 mg every 4 hours. 12.Hydrocodone 10 p.r.n. 13.Methocarbamol/aspirin 1000 mg q.8 hours. 14.Gabapentin 300 mg twice daily. 15.Colace 100 mg twice daily. 16.Aspirin  160 mg daily. 17.Simethicone 125 mg daily. 18.Zantac 75 mg daily. 19.Ephedrine.  ALLERGIES:  No known drug allergies.  SOCIAL HISTORY:  Positive for tobacco.  Negative for alcohol, illicit drugs.  She lives at home with a much younger nephew.  Not on home oxygen.  FAMILY HISTORY:  Significant for cancer.  PHYSICAL EXAMINATION:  VITAL SIGNS:  Blood pressure 189/102, pulse 89, respirations 23, temperature 97.8, satting 100% on room air. GENERAL:  Alert, oriented male, articulate, in no acute distress. EYES:  Pink conjunctivae.  PERRLA. ENT:  Moist oral mucosa.  Trachea midline. NECK:  Supple. LUNGS:  Clear to auscultation bilaterally.  No wheeze.  No use of accessory muscles.   CARDIOVASCULAR:  Regular rate and rhythm without murmurs, rigors, or gallops.   ABDOMEN:  Obese. Soft, positive bowel sounds.  Nontender, nondistended.  Unable to  assess organomegaly due to patient's body habitus. NEURO:  Cranial nerves II to XII appear grossly intact.  Sensation is intact.   MUSCULOSKELETAL:  Strength 5/5 in all extremities.  No clubbing, cyanosis, or edema.  SKIN:  Patient has on the right forearm, extensive cellulitis and an induration on the anterior surface of the forearm.  He also has been 3 bullous type lesion, one that has already been unroofed with clean and pink base.  On the patient's index finger he has a small, clean based, noninfected looking ulcer.  On patient's back there is evidence of numerous surgical scars, all well healed.  LABS:  Right forearm x-ray, no acute bony abnormalities.  White blood count 10.8, hemoglobin 14 platelets 169, INR 0.9.  No BMP has been ordered.  ASSESSMENT AND PLAN: 1. Cellulitis.  Patient denies insect bite, but is unsure what happenned    while he was delerious.  Blood cultures have been collected.  Will start IV     antibiotics and monitor. 2. Hypertension, uncontrolled.  Resume home medications.  First dose     now.  P.r.n. blood pressure medications will be ordered. 3. Bipolar disease. 4. Chronic back pain.  Resume patient's home medication.  Patient has     a very large brace that he wears. 5. History of AAA, resume home medications.          ______________________________ Gery Pray, MD     DC/MEDQ  D:  01/09/2011  T:  01/09/2011  Job:  [id number]  Electronically Signed by Gery Pray MD on 01/09/2011 06:00:48 AM

## 2011-01-10 LAB — COMPREHENSIVE METABOLIC PANEL
ALT: 22 U/L (ref 0–53)
AST: 17 U/L (ref 0–37)
Albumin: 3.1 g/dL — ABNORMAL LOW (ref 3.5–5.2)
Alkaline Phosphatase: 91 U/L (ref 39–117)
BUN: 18 mg/dL (ref 6–23)
CO2: 26 mEq/L (ref 19–32)
Calcium: 9.3 mg/dL (ref 8.4–10.5)
Chloride: 102 mEq/L (ref 96–112)
Creatinine, Ser: 0.82 mg/dL (ref 0.50–1.35)
GFR calc Af Amer: >90 mL/min (ref >90)
GFR calc non Af Amer: >90 mL/min (ref >90)
Glucose, Bld: 153 mg/dL — ABNORMAL HIGH (ref 70–99)
Potassium: 3.9 mEq/L (ref 3.5–5.1)
Sodium: 137 mEq/L (ref 135–145)
Total Bilirubin: 0.2 mg/dL — ABNORMAL LOW (ref 0.3–1.2)
Total Protein: 6.3 g/dL (ref 6.0–8.3)

## 2011-01-10 LAB — CBC
HCT: 40.6 % (ref 39.0–52.0)
Hemoglobin: 13.2 g/dL (ref 13.0–17.0)
MCH: 27.7 pg (ref 26.0–34.0)
MCHC: 32.5 g/dL (ref 30.0–36.0)
MCV: 85.1 fL (ref 78.0–100.0)
Platelets: 170 10*3/uL (ref 150–400)
RBC: 4.77 MIL/uL (ref 4.22–5.81)
RDW: 15.3 % (ref 11.5–15.5)
WBC: 7.5 10*3/uL (ref 4.0–10.5)

## 2011-01-10 LAB — PROTIME-INR
INR: 1.03 (ref 0.00–1.49)
Prothrombin Time: 13.7 seconds (ref 11.6–15.2)

## 2011-01-10 LAB — CK: Total CK: 85 U/L (ref 7–232)

## 2011-01-11 LAB — VANCOMYCIN, TROUGH: Vancomycin Tr: 10.4 ug/mL (ref 10.0–20.0)

## 2011-01-12 MED ORDER — ARMODAFINIL 150 MG PO TABS
150.0000 mg | ORAL_TABLET | Freq: Every day | ORAL | Status: DC
  Filled 2011-01-12 (×4): qty 1

## 2011-01-12 NOTE — Discharge Summary (Signed)
Michael Lutz, MABEE NO.:  [id number]  MEDICAL RECORD NO.:  [id number]  LOCATION:  1538                         FACILITY:  [facility]  PHYSICIAN:  Andreas Blower, MD       DATE OF BIRTH:  [birth year]  DATE OF ADMISSION:  01/08/2011 DATE OF DISCHARGE:  01/12/2011                              DISCHARGE SUMMARY   PRIMARY CARE PHYSICIAN:  Sigmund Hazel, M.D.  DISCHARGE DIAGNOSES: 1. Likely herpes zoster of right upper extremity with cellulitis. 2. Hypertension. 3. History of bipolar disorder. 4. Chronic low back pain. 5. Hypertriglyceridemia. 6. History of cholecystectomy. 7. Status post stenting for abdominal aortic aneurysm. 8. History of spinal fusion. 9. History of tobacco use. 10.Polypharmacy.  DISCHARGE MEDICATIONS: 1. Keflex 500 mg p.o. twice daily for 6 days. 2. Doxycycline 100 mg p.o. twice daily for 6 days. 3. Bactroban one application topically twice daily for 7 days. 4. Nicotine patch 14 mg every 24 hours transdermally.  The patient was     instructed not to smoke while on the patch. 5. Acyclovir 800 mg p.o. 5 times a day, to continue for a total of 6     more days to complete a 10-day course. 6. Amlodipine 5 mg p.o. q.a.m. 7. Aspirin half a tablet of 325 mg p.o. q.a.m. 8. Calcium carbonate 600 mg p.o. q.a.m. 9. Venlafaxine 150 mg p.o. q.a.m. 10.Furosemide 80 mg p.o. q.a.m. 11.Gabapentin 300 mg p.o. 3 times a day. 12.Hydrocodone/APAP 10/325 four times a day as needed for pain. 13.Klonopin 1 mg 1-3 tablets p.o. q.p.m. 14.Lamotrigine 200 mg p.o. q.a.m. 15.Atorvastatin 80 mg p.o. q.a.m. 16.Magnesium oxide 40 mg p.o. q.a.m. 17.Methocarbamol 1000 mg every 8 hours. 18.Multivitamin 1 tablet p.o. q.a.m. 19.Niacin 500 mg p.o. q.a.m. 20.Armodafinil 150 mg p.o. q.a.m. 21.Oxycodone 30 mg p.o. q.6 hours. 22.Pseudoephedrine 60 mg twice daily as needed for congestion. 23.Quinapril 80 mg p.o. q.a.m. 24.Ranitidine 75 mg p.o. q.a.m. as needed for  GERD. 25.Quetiapine 200-300 mg p.o. q.p.m. 26.Simethicone 125 mg p.o. q.a.m. as needed for distention. 27.Trazodone 300-400 mg p.o. daily at bedtime. 28.Vitamin D one tablet p.o. q.a.m. 29.Vitamin C 1000 mg p.o. q.a.m. 30.Vitamin D3 2000 IU p.o. q.a.m. 31.Vitamin E 1000 units p.o. q.a.m.  BRIEF ADMITTING HISTORY AND PHYSICAL:  Mr. Michael Lutz is a 65 year old Caucasian male, who presented on January 08, 2011, with complaint of blister and cellulitis of the right forearm.  RADIOLOGY/IMAGING:  The patient had x-ray of the right forearm which shows no acute abnormalities.  No abnormal radiopaques densities in the soft tissue.  The patient had portable chest x-ray, which showed mild cardiomegaly without pulmonary edema.  No focal pulmonary abnormality.  LABORATORY DATA:  CBC shows a white count of 7.5, hemoglobin 13.2, hematocrit 40.6, and platelet count 170.  Electrolytes normal with a BUN of 18 and creatinine 0.82.  Liver function tests normal except albumin of 3.1.  CK is 85.  Blood cultures x2 shows no growth to date.  HOSPITAL COURSE BY PROBLEM:1. Presumed herpes zoster of the right forearm with cellulitis.     Initially on admission, the patient had been on acyclovir for 4     days prior to presentation.  With severe  infection, the patient was     initially started on IV vancomycin.  He was also placed on     Augmentin.  On January 12, 2011, the patient informed me that his     primary care physician had called back and informed him that lab     test for herpes zoster had come back positive.  During the course     of hospital stay, his antibiotics were transitioned from vancomycin     and Augmentin to Keflex and doxycycline.  The patient will continue     antibiotics for 6 more days to complete a 10-day course.  The     patient was resumed back on acyclovir which he will continue for 6     more days to complete a 10-day course of antiviral therapy.  The     patient was also placed on  topical Bactroban which he will continue     for 6 more days as well.  The patient was instructed to follow with     his primary care physician in 1 week. 2. Hypertension, stable.  Continue the patient on home medications. 3. History of bipolar disorder, stable.  Continue patient on home     medications. 4. Chronic low back pain was not an active issue.  The patient was     continued on home medications. 5. Polypharmacy.  The patient is on multiple redundant medications.     Will defer to his primary care physician and management of his     polypharmacy.  DISPOSITION:  The patient to follow up with his primary care physician, Dr. Sigmund Hazel in 1-2 weeks.  Time spent on discharge talking to the patient and coordinating care was 35 minutes.     Andreas Blower, MD     SR/MEDQ  D:  01/12/2011  T:  01/12/2011  Job:  [id number]  Electronically Signed by Wardell Heath Wanna Gully  on 01/12/2011 08:52:22 PM

## 2011-01-13 ENCOUNTER — Other Ambulatory Visit (HOSPITAL_COMMUNITY): Payer: Managed Care, Other (non HMO)

## 2011-01-15 LAB — CULTURE, BLOOD (ROUTINE X 2)
Culture  Setup Time: 201210290902
Culture  Setup Time: 201210290902
Culture: NO GROWTH
Culture: NO GROWTH

## 2011-01-28 IMAGING — CR DG CHEST 1V PORT
1 series · 1 of 1 positions shown · non-contrast
Comparison: 11/17/2009

CLINICAL DATA: Central line placement, postoperative exam

PORTABLE CHEST - 1 VIEW

[view not recorded]
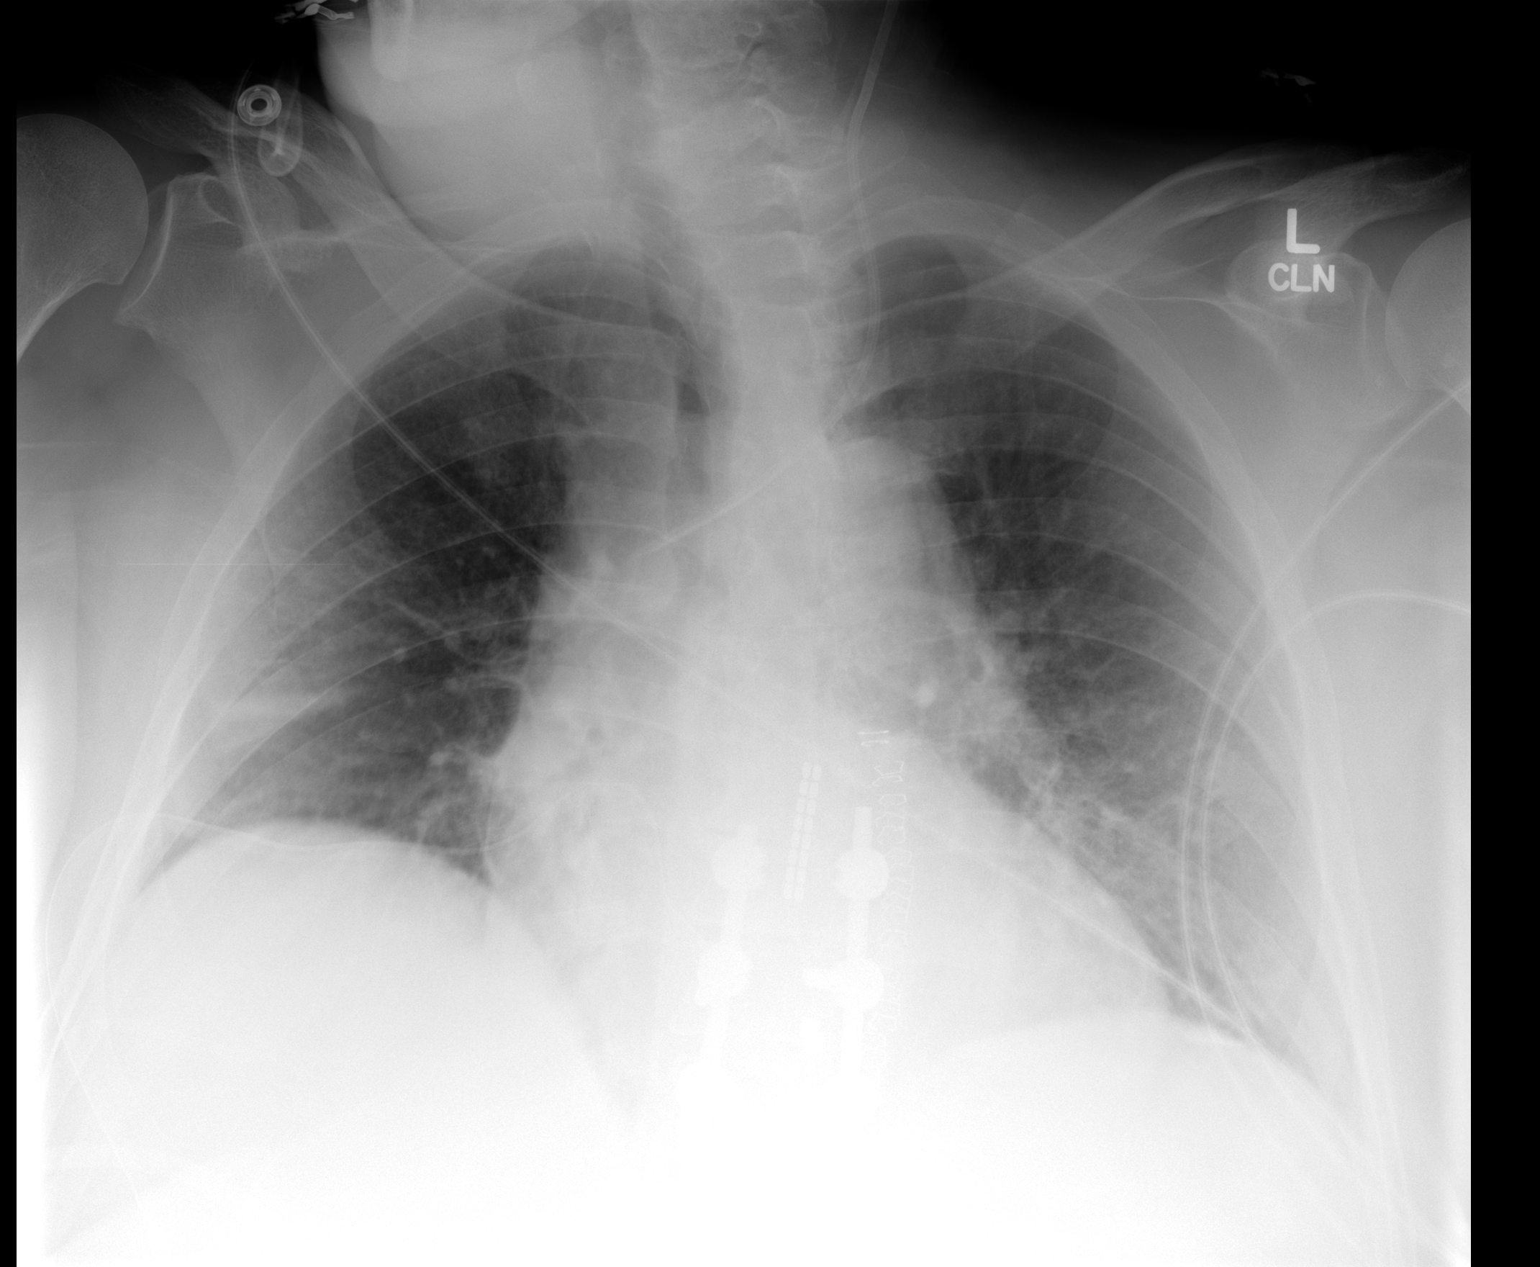

[1 of 1 positions shown; findings below may reference images not displayed]

FINDINGS: Spinal fusion hardware and neural stimulator noted.
Right IJ sheath has been removed.  Left IJ central line tip
terminates over the brachiocephalic/SVC confluence.  Lung volumes
are low with plate-like right lower lobe atelectasis.  No
pneumothorax.
IMPRESSION: Left IJ central line tip at brachiocephalic/SVC junction without
pneumothorax.

## 2011-01-28 IMAGING — RF DG THORACIC SPINE 2V
1 series · 2 of 2 positions shown · non-contrast
Comparison: Chest radiograph 11/17/2009 and earlier. Lumbar
intraoperative radiographs from the same day.

CLINICAL DATA: 64-year-old male undergoing spinal stimulator
placement, spine surgery.

THORACIC SPINE - 2 VIEW

[Series 1: run · 2 of 2 slices shown]
[im 1/2]
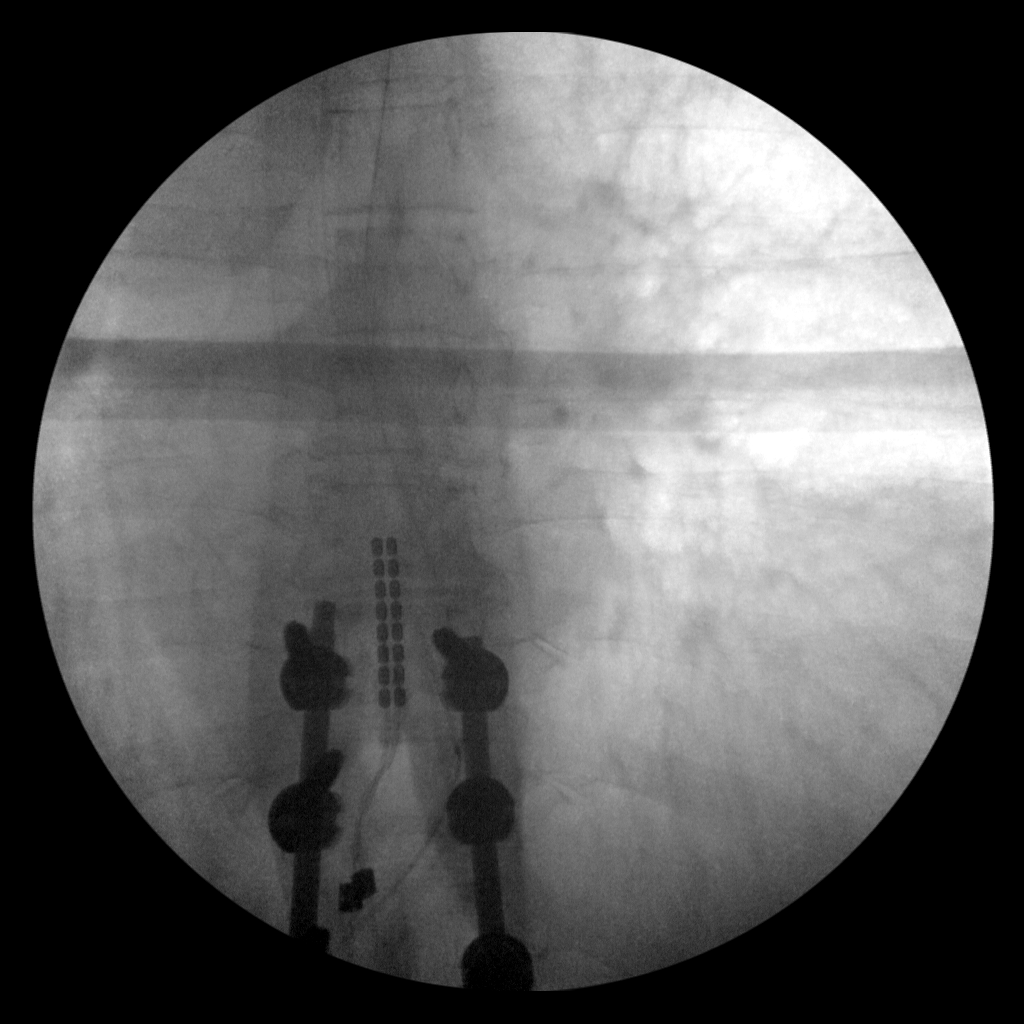
[im 2/2]
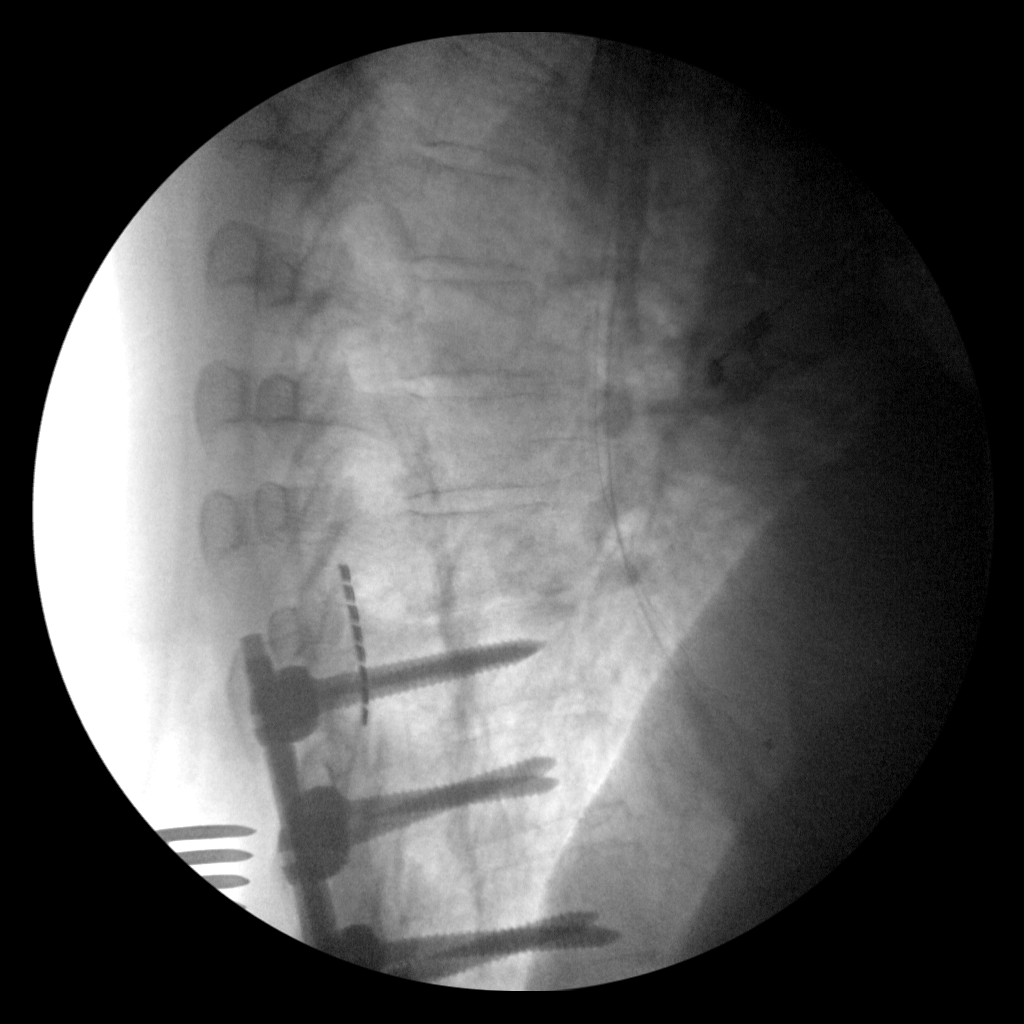

[2 of 2 positions shown; findings below may reference images not displayed]

FINDINGS: Intraoperative fluoroscopic spot views of the lower
thoracic spine in the frontal and lateral projections.
Transpedicular hardware extending caudally.  Sequelae of
decompression at the fused levels.  Spinal stimulator device
projects over the dorsal aspect of the spinal canal at and just
above the cephalad most fused level which is probably T9 judging
from the comparison lumbar radiographs.
IMPRESSION: Lower thoracic fusion and decompression and spinal cord stimulator
device placed as above.

## 2011-02-04 IMAGING — CT CT L SPINE W/O CM
3 series · 15 of 33 positions shown, 18 images · non-contrast
Comparison: Lumbar radiographs 12/14/2009, lumbar spine CT
08/10/2009 and abdominal pelvic CT 10/19/2009.

CLINICAL DATA: Post laminectomy back pain - possible infection.
Surgery 12/14/2009.

CT LUMBAR SPINE WITHOUT CONTRAST
TECHNIQUE: Multidetector CT imaging of the lumbar spine was
performed without intravenous contrast administration.  Multiplanar
CT image reconstructions were also generated.

[Series 4: 2mm axial soft tissue · axial · 0.28mm/px · z∈[-344,-152]mm · 7 of 114 slices shown, 9 images]
[im 9/114  soft-tissue]
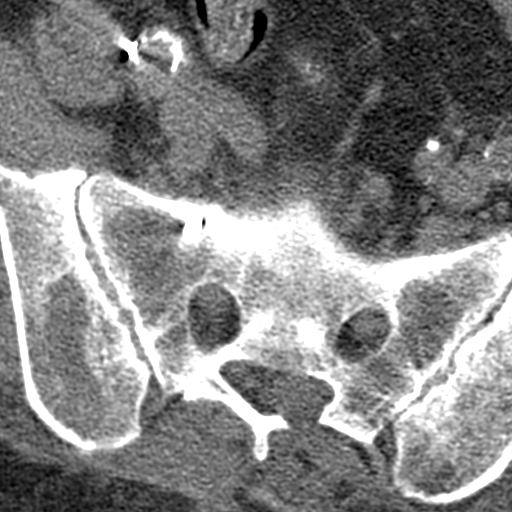
[im 9/114  bone]
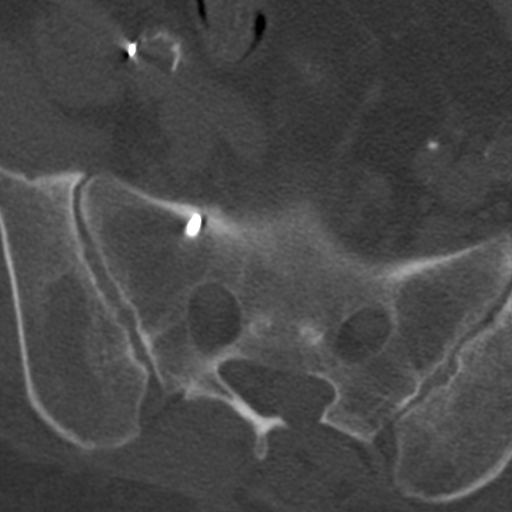
[im 27/114  bone]
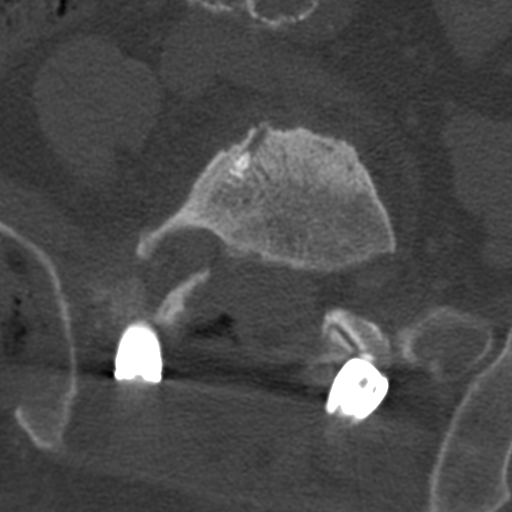
[im 44/114  bone]
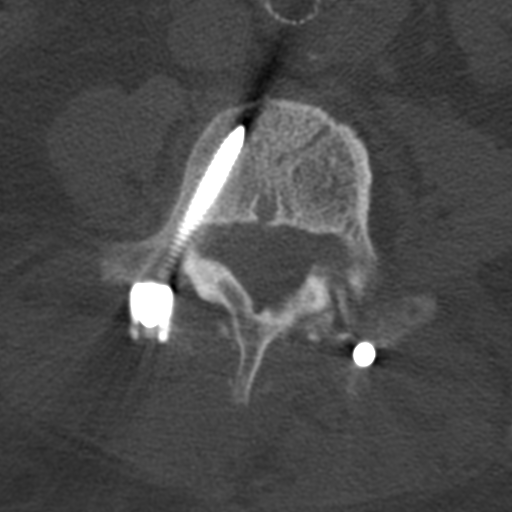
[im 61/114  bone]
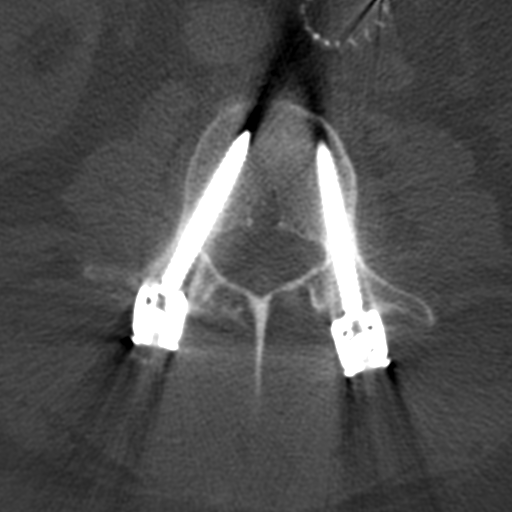
[im 70/114  soft-tissue]
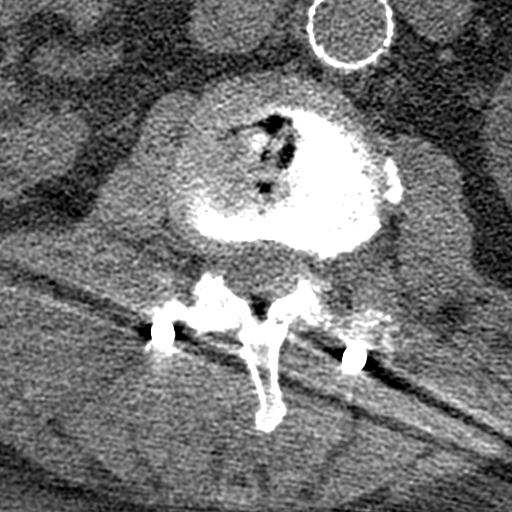
[im 70/114  bone]
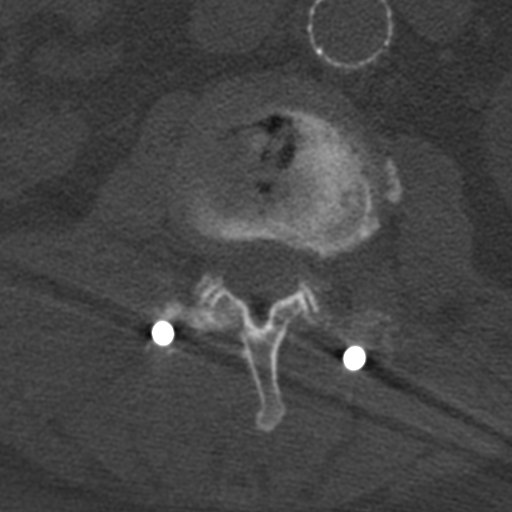
[im 87/114  bone]
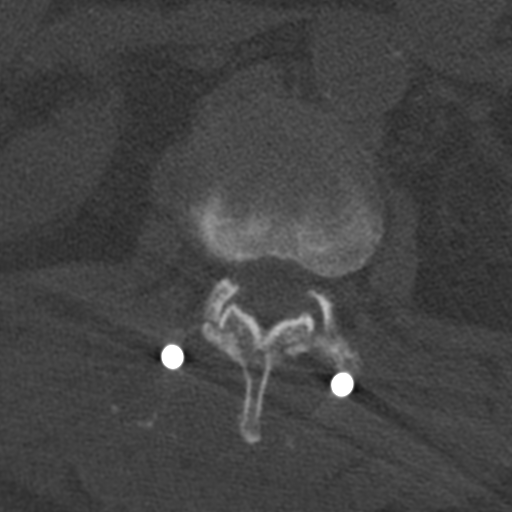
[im 105/114  bone]
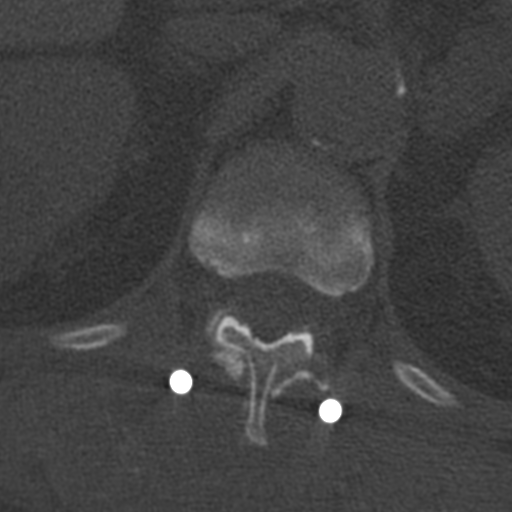

[Series 604: coronals · coronal · 0.44mm/px · 3 of 59 slices shown]
[im 12/59  bone]
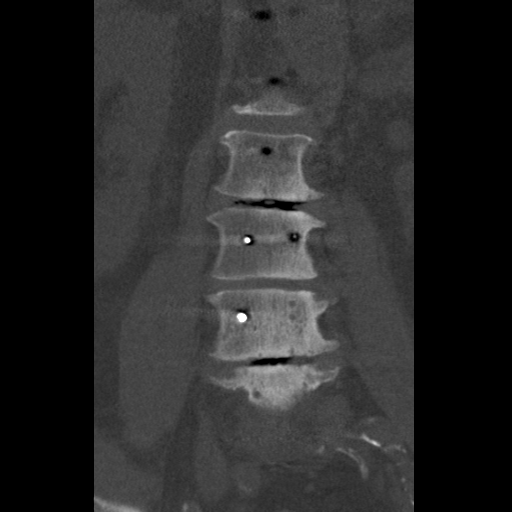
[im 24/59  bone]
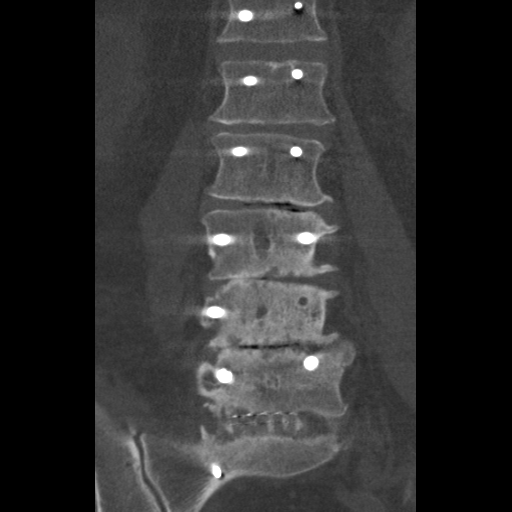
[im 35/59  bone]
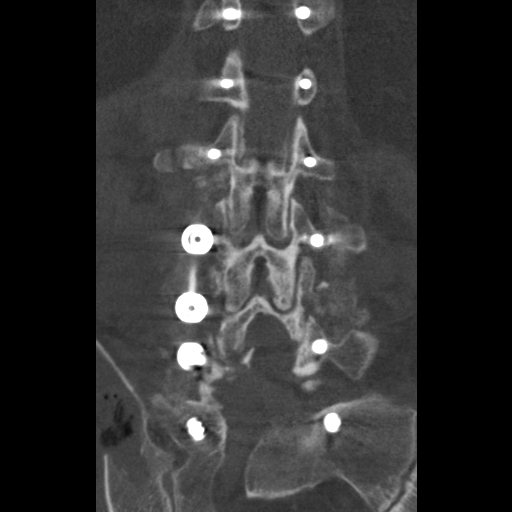

[Series 605: sagittals · sagittal · 0.44mm/px · 5 of 58 slices shown, 6 images]
[im 20/58  bone]
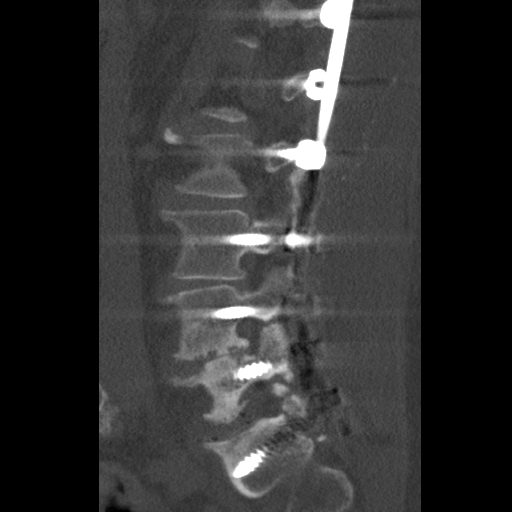
[im 24/58  bone]
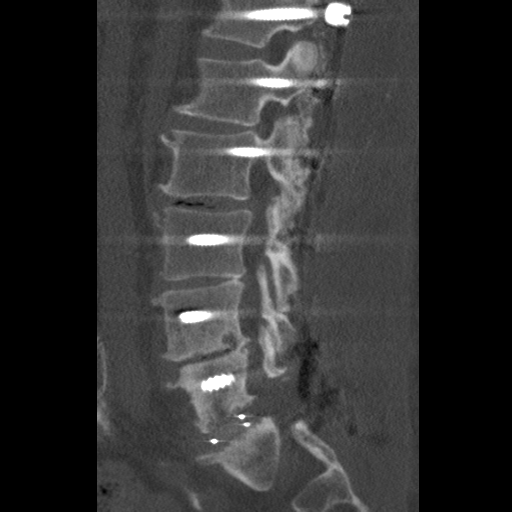
[im 29/58  soft-tissue]
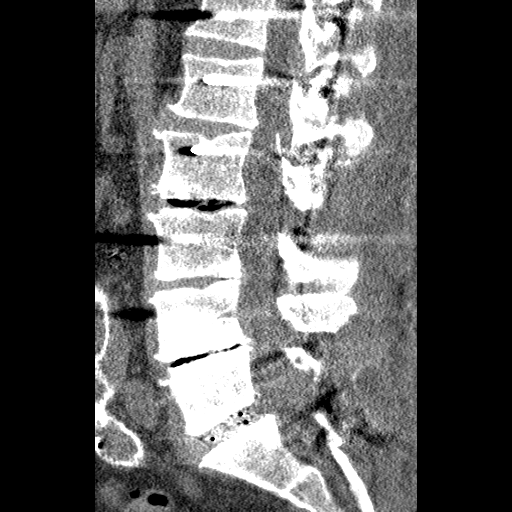
[im 29/58  bone]
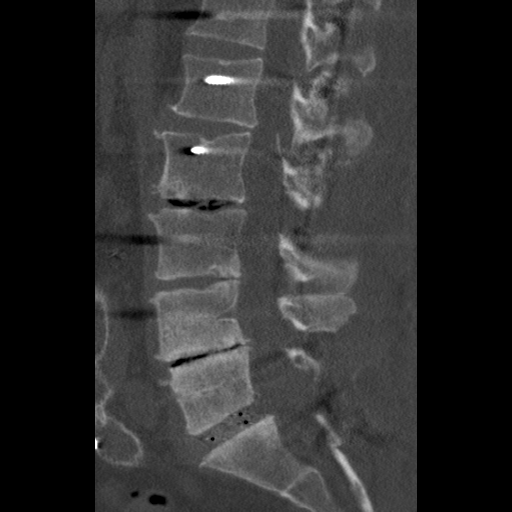
[im 34/58  bone]
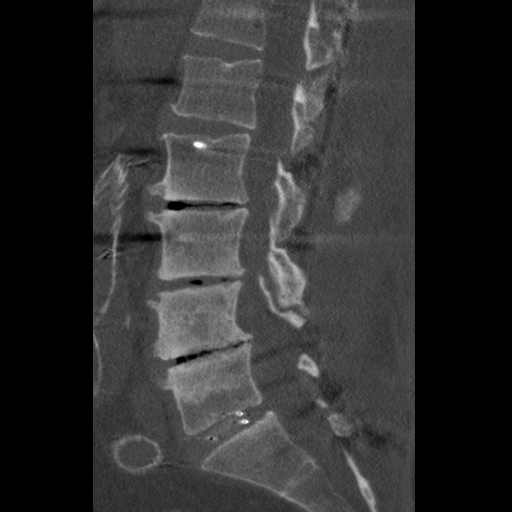
[im 39/58  bone]
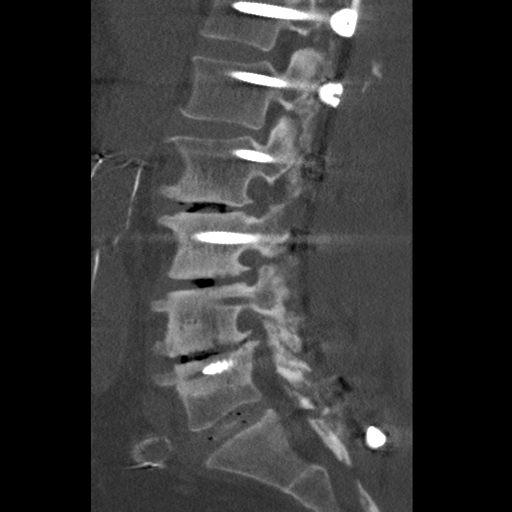

[15 of 33 positions shown; findings below may reference images not displayed]

FINDINGS: Axial CT images extend from mid T12 through the sacrum.
The scout images demonstrate spinal hardware extending from T9
through the upper sacrum.  L4-S1 laminectomies have been performed
with placement of an interbody spacer at L5-S1.  There is a single
right-sided pedicle screw at L4 with a left-sided tract implying
attempted pedicle screw placement at this level.  Bilateral pedicle
screws are present at the additional levels from T12-S1.  Lucencies
adjacent to the right L5 pedicle screw are unchanged.  The left L5
pedicle screw is in close proximity to the superior endplate.  No
hardware loosening is demonstrated.  There is some osseous
fragmentation adjacent to the right sacral screw which is most
apparent on the coronal images and consistent with a small
fracture.  No other fractures are identified.

The sagittal alignment is stable. There is no significant residual
scoliosis.

There are postsurgical changes in the posterior paraspinal fat with
poor definition of the paraspinal musculature.  No focal fluid
collection is evident on noncontrast imaging.  There is some air
posteriorly on the right at the right L5 laminectomy, possibly due
to a surgical drain or Gelfoam.  There is a surgical defect in the
right iliac bone presumably from bone harvesting.  No epidural or
anterior paraspinal fluid collection is demonstrated.  There is no
evidence of bone destruction.

There is no high-grade residual spinal or foraminal stenosis.  Mild
foraminal stenosis remains at L4-L5, asymmetric to the left.

Aorto iliac stent graft is partially imaged and appears grossly
stable.  There are stable bilateral adrenal adenomas.
IMPRESSION: 1.  Interval L4-S1 laminectomy with posterolateral fusion from T9-
S1.  CT only images from mid T12 through the upper sacrum.
2.  Small fracture adjacent the right S1 pedicle screw.
3.  Nonspecific postsurgical changes in the posterior paraspinal
soft tissues.  No focal fluid collection evident on noncontrast
imaging.
4.  No evidence of bone destruction/osteomyelitis.

5.  Improved scoliosis.

## 2011-03-16 DIAGNOSIS — M961 Postlaminectomy syndrome, not elsewhere classified: Secondary | ICD-10-CM | POA: Diagnosis not present

## 2011-03-24 ENCOUNTER — Encounter (HOSPITAL_COMMUNITY): Payer: Self-pay | Admitting: Pharmacy Technician

## 2011-03-29 ENCOUNTER — Encounter: Payer: Self-pay | Admitting: Vascular Surgery

## 2011-03-30 ENCOUNTER — Ambulatory Visit (INDEPENDENT_AMBULATORY_CARE_PROVIDER_SITE_OTHER): Payer: Medicare Other | Admitting: Vascular Surgery

## 2011-03-30 ENCOUNTER — Encounter: Payer: Self-pay | Admitting: Vascular Surgery

## 2011-03-30 VITALS — BP 138/85 | HR 106 | Resp 24 | Ht 69.0 in | Wt 289.0 lb

## 2011-03-30 DIAGNOSIS — I714 Abdominal aortic aneurysm, without rupture, unspecified: Secondary | ICD-10-CM

## 2011-03-30 DIAGNOSIS — Z48812 Encounter for surgical aftercare following surgery on the circulatory system: Secondary | ICD-10-CM | POA: Diagnosis not present

## 2011-03-30 DIAGNOSIS — M48061 Spinal stenosis, lumbar region without neurogenic claudication: Secondary | ICD-10-CM | POA: Diagnosis not present

## 2011-03-30 DIAGNOSIS — M5106 Intervertebral disc disorders with myelopathy, lumbar region: Secondary | ICD-10-CM | POA: Diagnosis not present

## 2011-03-30 DIAGNOSIS — M4120 Other idiopathic scoliosis, site unspecified: Secondary | ICD-10-CM | POA: Diagnosis not present

## 2011-03-30 DIAGNOSIS — Z981 Arthrodesis status: Secondary | ICD-10-CM | POA: Diagnosis not present

## 2011-03-30 NOTE — Progress Notes (Signed)
[facility] [place] HISTORY AND PHYSICAL    History of Present Illness:  Patient is a 66 y.o.67 y.o. year old male who presents for follow-up evaluation of AAA. 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 The patient denies new abdominal or back pain.  He has chronic back pain from spine issues.  The patient's atherosclerotic risk factors remain smoking, hyperlipidemia, hypertension.  These are all currently stable and followed by his primary care physician.   Past Medical History  Diagnosis Date  . Heart murmur     arrythmia  . Arthritis   . Anxiety   . Depression   . ADD (attention deficit disorder)   . AAA (abdominal aortic aneurysm)   . Hyperlipidemia   . Hypertension   . Joint pain   . Leg pain 05-04-10    with walking  . History of shingles      Past Surgical History  Procedure Date  . Spine surgery 12/2009    Spinal Fusion by Dr. Alveda Reasons  . Abdominal aortic aneurysm repair 11/2009    Stent graft repair  . Foot surgery     Multiple surgeries on right foot  . Shoulder surgery     Right shoulder  . Tonsillectomy   . Testicle surgery     right testicle  . Turp vaporization        Review of Systems:  Neurologic: denies symptoms of TIA, amaurosis, or stroke Cardiac:denies shortness of breath or chest pain Pulmonary: denies cough or wheeze Abdomen: denies abdominal pain nausea or vomiting  History   Social History  . Marital Status: Single    Spouse Name: N/A    Number of Children: N/A  . Years of Education: N/A   Occupational History  . Not on file.   Social History Main Topics  . Smoking status: Current Everyday Smoker -- 0.5 packs/day    Types: Cigars  . Smokeless tobacco: Not on file   Comment: states smokes "little cigars"  . Alcohol Use: No  . Drug Use: No  . Sexually Active: Not on file   Other Topics Concern  . Not on file   Social History Narrative  . No narrative on file    No Known  Allergies  Current Outpatient Prescriptions on File Prior to Visit  Medication Sig Dispense Refill  . amLODipine (NORVASC) 5 MG tablet Take 5 mg by mouth daily.        . Armodafinil (NUVIGIL) 150 MG tablet Take 150 mg by mouth every morning.      . Ascorbic Acid (VITAMIN C) 1000 MG tablet Take 1,000 mg by mouth daily.      Marland Kitchen aspirin 325 MG tablet Take 325 mg by mouth. One half tablet daily      . b complex vitamins tablet Take 1 tablet by mouth every morning.      . calcium carbonate (OS-CAL) 600 MG TABS Take 600 mg by mouth daily.        . cholecalciferol (VITAMIN D) 1000 UNITS tablet Take 2,000 Units by mouth every morning.      . clonazePAM (KLONOPIN) 1 MG tablet Take 1 mg by mouth 2 (two) times daily as needed. 2 at night for sleep & 1 during the day for anxiety.      . docusate sodium (COLACE) 100 MG capsule Take 100 mg by mouth 2 (two) times daily as needed. Constipation      . ezetimibe (ZETIA) 10 MG tablet  Take 10 mg by mouth daily.      . furosemide (LASIX) 40 MG tablet Take 80 mg by mouth every morning.      . gabapentin (NEURONTIN) 300 MG capsule Take 300 mg by mouth. Take 1 tab every 6 hrs.      Marland Kitchen HYDROcodone-acetaminophen (NORCO) 10-325 MG per tablet Take 1 tablet by mouth every 4 (four) hours as needed. For break through pain.      Marland Kitchen ibuprofen (ADVIL,MOTRIN) 600 MG tablet Take 600 mg by mouth 3 (three) times daily.      Marland Kitchen lamoTRIgine (LAMICTAL) 200 MG tablet Take 200 mg by mouth at bedtime.       . magnesium oxide (MAG-OX) 400 MG tablet Take 400 mg by mouth daily.        . methocarbamol (ROBAXIN) 500 MG tablet Take 500 mg by mouth every 4 (four) hours. Takes at the same time as the hydrocodone.      . Multiple Vitamin (MULTIVITAMIN) tablet Take 1 tablet by mouth daily.        . niacin 500 MG tablet Take 500 mg by mouth daily with breakfast.        . Omega-3 Fatty Acids (FISH OIL PO) Take 1,000 mg by mouth daily.        Marland Kitchen oxycodone (OXYCONTIN) 30 MG TB12 Take 30 mg by mouth every  4 (four) hours.      . pseudoephedrine (SUDAFED) 30 MG tablet Take 60 mg by mouth every 4 (four) hours as needed. Congestion      . QUEtiapine (SEROQUEL) 100 MG tablet Take 200-300 mg by mouth at bedtime. Takes 1 capsules at bedtime and 1 capsule during the day if needed for sleep.      Marland Kitchen quinapril (ACCUPRIL) 40 MG tablet Take 40 mg by mouth 2 (two) times daily.        . ranitidine (ZANTAC) 75 MG tablet Take 75 mg by mouth. Take one daily      . simethicone (MYLICON) 125 MG chewable tablet Chew 125 mg by mouth every morning.      . traZODone (DESYREL) 100 MG tablet Take 400 mg by mouth at bedtime.       Marland Kitchen venlafaxine (EFFEXOR-XR) 150 MG 24 hr capsule Take 150 mg by mouth daily.        . vitamin E 1000 UNIT capsule Take 1,000 Units by mouth daily.      . hydrochlorothiazide 25 MG tablet Take 25 mg by mouth daily.        Marland Kitchen lidocaine (LIDODERM) 5 % Place 1 patch onto the skin daily. For pain  Remove & Discard patch within 12 hours or as directed by MD           Physical Examination    Filed Vitals:   03/30/11 1207  BP: 138/85  Pulse: 106  Resp: 24  Height: 5\' 9"  (1.753 m)  Weight: 289 lb (131.09 kg)     General:  Alert and oriented, no acute distress HEENT: Normal Neck: No bruit or JVD Pulmonary: Clear to auscultation bilaterally Cardiac: Regular Rate and Rhythm without murmur Abdomen: Soft, non-tender, non-distended, normal bowel sounds, no pulsatile mass Extremities: 2+ femoral pulses   DATA:  Aortic duplex was performed today which shows his aneurysm diameter is currently 3.3 cm.  ASSESSMENT:  Doing well status post Gore Excluder stent graft repair aneurysm.  His aneurysm has shown 4.7 cm to currently 3.3 cm   PLAN: Patient will return in 1 year  to review his stent graft with an aortic ultrasound in the office  Fabienne Bruns, MD [facility] Office: [phone] Pager: [phone]

## 2011-03-31 ENCOUNTER — Encounter (HOSPITAL_COMMUNITY)
Admission: RE | Admit: 2011-03-31 | Discharge: 2011-03-31 | Disposition: A | Discharge status: Home / Self Care | Payer: Medicare Other | Source: Ambulatory Visit | Attending: Orthopedic Surgery | Admitting: Orthopedic Surgery

## 2011-03-31 ENCOUNTER — Encounter (HOSPITAL_COMMUNITY): Payer: Self-pay

## 2011-03-31 HISTORY — DX: Cardiac arrhythmia, unspecified: I49.9

## 2011-03-31 HISTORY — DX: Dorsalgia, unspecified: M54.9

## 2011-03-31 HISTORY — DX: Peripheral vascular disease, unspecified: I73.9

## 2011-03-31 HISTORY — DX: Bipolar disorder, unspecified: F31.9

## 2011-03-31 HISTORY — DX: Polyp of colon: K63.5

## 2011-03-31 HISTORY — DX: Other chronic pain: G89.29

## 2011-03-31 LAB — URINALYSIS, ROUTINE W REFLEX MICROSCOPIC
Bilirubin Urine: NEGATIVE
Glucose, UA: NEGATIVE mg/dL
Hgb urine dipstick: NEGATIVE
Ketones, ur: NEGATIVE mg/dL
Nitrite: NEGATIVE
Protein, ur: 30 mg/dL — AB
Specific Gravity, Urine: 1.025 (ref 1.005–1.030)
Urobilinogen, UA: 1 mg/dL (ref 0.0–1.0)
pH: 6 (ref 5.0–8.0)

## 2011-03-31 LAB — COMPREHENSIVE METABOLIC PANEL
ALT: 33 U/L (ref 0–53)
AST: 30 U/L (ref 0–37)
Albumin: 3.8 g/dL (ref 3.5–5.2)
Alkaline Phosphatase: 115 U/L (ref 39–117)
BUN: 20 mg/dL (ref 6–23)
CO2: 28 mEq/L (ref 19–32)
Calcium: 9.7 mg/dL (ref 8.4–10.5)
Chloride: 102 mEq/L (ref 96–112)
Creatinine, Ser: 0.78 mg/dL (ref 0.50–1.35)
GFR calc Af Amer: >90 mL/min (ref >90)
GFR calc non Af Amer: >90 mL/min (ref >90)
Glucose, Bld: 97 mg/dL (ref 70–99)
Potassium: 4.4 mEq/L (ref 3.5–5.1)
Sodium: 139 mEq/L (ref 135–145)
Total Bilirubin: 0.3 mg/dL (ref 0.3–1.2)
Total Protein: 7.4 g/dL (ref 6.0–8.3)

## 2011-03-31 LAB — TYPE AND SCREEN
ABO/RH(D): O POS
Antibody Screen: NEGATIVE
Unit division: 0
Unit division: 0
Unit division: 0

## 2011-03-31 LAB — CBC
HCT: 46.1 % (ref 39.0–52.0)
Hemoglobin: 15.8 g/dL (ref 13.0–17.0)
MCH: 29 pg (ref 26.0–34.0)
MCHC: 34.3 g/dL (ref 30.0–36.0)
MCV: 84.6 fL (ref 78.0–100.0)
Platelets: 172 10*3/uL (ref 150–400)
RBC: 5.45 MIL/uL (ref 4.22–5.81)
RDW: 15.6 % — ABNORMAL HIGH (ref 11.5–15.5)
WBC: 10 10*3/uL (ref 4.0–10.5)

## 2011-03-31 LAB — PREPARE RBC (CROSSMATCH)

## 2011-03-31 LAB — URINE MICROSCOPIC-ADD ON

## 2011-03-31 LAB — SURGICAL PCR SCREEN
MRSA, PCR: NEGATIVE
Staphylococcus aureus: NEGATIVE

## 2011-03-31 LAB — PROTIME-INR
INR: 1.05 (ref 0.00–1.49)
Prothrombin Time: 13.9 seconds (ref 11.6–15.2)

## 2011-03-31 LAB — APTT: aPTT: 42 seconds — ABNORMAL HIGH (ref 24–37)

## 2011-03-31 NOTE — Pre-Procedure Instructions (Signed)
21 Michael Lutz  03/31/2011   Your procedure is scheduled on:  Tues, Jan 22  Report to [facility] at 0530 AM.  Call this number if you have problems the morning of surgery: [phone]   Remember:   Do not eat food:After Midnight.  May have clear liquids: up to 4 Hours before arrival.  Clear liquids include soda, tea, black coffee, apple or grape juice, broth.  Take these medicines the morning of surgery with A SIP OF WATER: *Effexor,Amlodipine,Oxycodone,Metha,Gabapentin ( Stop ASA,Vit E , Fish oil,Ibuprofen)**   Do not wear jewelry, make-up or nail polish.  Do not wear lotions, powders, or perfumes. You may wear deodorant.  Do not shave 48 hours prior to surgery.  Do not bring valuables to the hospital.  Contacts, dentures or bridgework may not be worn into surgery.  Leave suitcase in the car. After surgery it may be brought to your room.  For patients admitted to the hospital, checkout time is 11:00 AM the day of discharge.   Patients discharged the day of surgery will not be allowed to drive home.  Name and phone number of your driver: *na**  Special Instructions: Incentive Spirometry - Practice and bring it with you on the day of surgery. and CHG Shower Use Special Wash: 1/2 bottle night before surgery and 1/2 bottle morning of surgery.   Please read over the following fact sheets that you were given: Pain Booklet, Coughing and Deep Breathing, Blood Transfusion Information, MRSA Information and Surgical Site Infection Prevention

## 2011-04-03 MED ORDER — VANCOMYCIN HCL 1000 MG IV SOLR
1500.0000 mg | INTRAVENOUS | Status: AC
  Administered 2011-04-04: 1500 mg via INTRAVENOUS
  Filled 2011-04-03: qty 1500

## 2011-04-03 NOTE — Consult Note (Signed)
Anesthesia:  Patient is a 66 year old male scheduled for lumbar osteotomy repair of lumbar/thoracic psuedoarthrosis for failed fusion and lumbar kyphosis on 04/04/11.  His history includes AAA s/p EVAR 11/2009 (Dr. Darrick Penna), murmur, HLD, HTN, PVD, ADD, Bipolar disorder, palpitations, and smoking.  He also underwent lumbar fusion and lap chole in 2011.  Labs noted.  PTT is 42. PT/INR WNL. H/H 15.8/46.1.  Dr. Alveda Reasons has ordered a T & C for 3 Units, which are available if needed.    CXR on 05/31/10 showed improved basilar aeration. No active cardiopulmonary process.    He was evaluated by Cardiologist is Dr.Varanasi in August of 2011.  He had a Lexiscan study done on 11/01/09 that showed normal myocardial perfusion, EF 64%.  Echo on 10/29/09 showed EF 70-75%, mild LA enlargement, mod calcification AV, mild AS/AR, mild aortic root dilatation, impaired relaxation.  EKG on 05/31/10 NSR, possible LAE.  He has probable Q wave in III, but not noted in other inferior leads.  This was noted in prior EKGs.  Anticipate he will be okay to proceed as scheduled.

## 2011-04-03 NOTE — H&P (Signed)
. ________________________________________________________________  PATIENTLadona Lutz DATE OF BIRTH: December 14, 1945 DATE: 03/30/2011 2:15 PM  VISIT TYPE: Pre Op Visit ________________________________________________________________  History of Present Illness: This 66 year old  male presents with: 1.  back pain  (follow-up)  Patient comes into the office today for H&P.  Patient is scheduled for repair of multiple pseduoarthrosis to be done on 04/04/11.  Takes oxycodone, 30 mg. q 4h  - toal 180 mg daily; hydrocodone, up to 5 tablets, 10/325.  He is concerned that he takes  Past Medical History:    Disease Year  arthritis   night sweats   HTN   abdominal aneurysm   bipolar   prostate problems   Depression   flu/shingles    Past Surgical History: Procedure/Surgery Side Year           L3-4 lumbar surgery  2007   6 rt foot surgeries     rt shoulder surgery     TURP  1994              SCS  2010  stint  2011  er 10.29.12    Cholecystectomy      Allergies Reviewed, no changes. No known allergies.  Family History:  Yes / No Disease Detail Family Member Name Age  Yes Arthritis Mother    Yes Arthritis Sister    Yes Diabetes Father    Yes Diabetes Grandfather  (m)    Yes HBP Father    Yes HBP Grandfather  (m)    Yes Heart Disease Mother     Social History   The patient is left-handed.    EDUCATION / EMPLOYMENT / OCCUPATION:  Employment Emp. History Emp Status Retire Date Restrictions  [employer]        MARITAL STATUS / FAMILY / SOCIAL SUPPORT: Currently S.    TOBACCO: Smoking status: Current every day smoker.  ALCOHOL: There is no history of alcohol use.    Physical Exam Constitutional:   Patient appears well nourished, well developed. No apparent distress. Head / Face:  Normocephalic.   Eyes:   Pupils are equal and reactive to light.  Conjunctiva and lids are normal. Ears:   Hearing grossly intact. Tympanic membranes normal. Nose /  Mouth / Throat:   Nose, mouth, tongue and oropharynx are normal in appearance.  Mucous membranes are moist. Neck / Thyroid:   Neck is supple.  Thyroid is smooth and nontender.  Respiratory:   Lungs clear to auscultation bilaterally.  No wheezes, rails or rhonchi. Cardiovascular No edema is present.  Comments: Systolic murmur heard best over the aortic area, no carotid bruits..  Abdomen Comments:  Obese, non tender. Genitourinary Comments:  not examined. Back / Spine  Comments: well healed thoraco-lumbar incision, stands flexed at the waist, ROM not  tested secondary to pain.  No neuro deficits in either LE Extremities  No edema is present.  No cyanosis.   Vital Signs:  Time BP mm/Hg Pulse/min Resp/min Temp F Ht ft Ht in Wt lb BMI kg/m2 BSA m2  2:53 PM 154/92 108   5.0 10.00 285.00 40.89    Measured By: Time   2:53 PM Sherral Hammers   Clinical Assessment Dx: lumbar kyphosis, lumbar and thoracic pseudoarthrosis    He has a lumbar kyphosis which I think we must correct, otherwise he cannot stand erect I have talked to him today and we will plan on doing that as repair of pseudoarthrosis and extension of fusion.  This  will be an all day project.  We discussed the nature of the surgery and the general and specific complications.  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  Provider:  Inocente Salles. Luzmaria Devaux MD  04/03/2011  Document generated by: Inocente Salles. Alveda Reasons, MD 04/03/2011  [address]    [place], [place] [address]             Phone: [phone]     Fax: [phone]

## 2011-04-04 ENCOUNTER — Encounter (HOSPITAL_COMMUNITY): Payer: Self-pay | Admitting: Vascular Surgery

## 2011-04-04 ENCOUNTER — Inpatient Hospital Stay (HOSPITAL_COMMUNITY): Payer: Medicare Other | Admitting: Vascular Surgery

## 2011-04-04 ENCOUNTER — Inpatient Hospital Stay (HOSPITAL_COMMUNITY): Payer: Medicare Other

## 2011-04-04 ENCOUNTER — Encounter (HOSPITAL_COMMUNITY): Payer: Self-pay | Admitting: Internal Medicine

## 2011-04-04 ENCOUNTER — Encounter (HOSPITAL_COMMUNITY): Payer: Self-pay | Admitting: *Deleted

## 2011-04-04 ENCOUNTER — Other Ambulatory Visit: Payer: Self-pay

## 2011-04-04 ENCOUNTER — Encounter (HOSPITAL_COMMUNITY)
Admission: RE | Disposition: A | Discharge status: Home Health | Payer: Self-pay | Source: Ambulatory Visit | Attending: Orthopedic Surgery

## 2011-04-04 ENCOUNTER — Inpatient Hospital Stay (HOSPITAL_COMMUNITY)
Admission: RE | Admit: 2011-04-04 | Discharge: 2011-04-13 | DRG: 460 | Disposition: A | Discharge status: Home Health | Payer: Medicare Other | Source: Ambulatory Visit | Attending: Orthopedic Surgery | Admitting: Orthopedic Surgery

## 2011-04-04 DIAGNOSIS — F319 Bipolar disorder, unspecified: Secondary | ICD-10-CM | POA: Diagnosis present

## 2011-04-04 DIAGNOSIS — D62 Acute posthemorrhagic anemia: Secondary | ICD-10-CM | POA: Diagnosis not present

## 2011-04-04 DIAGNOSIS — D696 Thrombocytopenia, unspecified: Secondary | ICD-10-CM | POA: Diagnosis not present

## 2011-04-04 DIAGNOSIS — I714 Abdominal aortic aneurysm, without rupture, unspecified: Secondary | ICD-10-CM | POA: Diagnosis not present

## 2011-04-04 DIAGNOSIS — F988 Other specified behavioral and emotional disorders with onset usually occurring in childhood and adolescence: Secondary | ICD-10-CM | POA: Diagnosis present

## 2011-04-04 DIAGNOSIS — F17200 Nicotine dependence, unspecified, uncomplicated: Secondary | ICD-10-CM | POA: Diagnosis present

## 2011-04-04 DIAGNOSIS — J9819 Other pulmonary collapse: Secondary | ICD-10-CM | POA: Diagnosis not present

## 2011-04-04 DIAGNOSIS — G8918 Other acute postprocedural pain: Secondary | ICD-10-CM | POA: Diagnosis not present

## 2011-04-04 DIAGNOSIS — Y9210 Unspecified residential institution as the place of occurrence of the external cause: Secondary | ICD-10-CM | POA: Diagnosis present

## 2011-04-04 DIAGNOSIS — S2092XA Blister (nonthermal) of unspecified parts of thorax, initial encounter: Secondary | ICD-10-CM | POA: Diagnosis not present

## 2011-04-04 DIAGNOSIS — M961 Postlaminectomy syndrome, not elsewhere classified: Secondary | ICD-10-CM | POA: Diagnosis present

## 2011-04-04 DIAGNOSIS — R0789 Other chest pain: Secondary | ICD-10-CM | POA: Diagnosis not present

## 2011-04-04 DIAGNOSIS — I798 Other disorders of arteries, arterioles and capillaries in diseases classified elsewhere: Secondary | ICD-10-CM | POA: Diagnosis not present

## 2011-04-04 DIAGNOSIS — Y92009 Unspecified place in unspecified non-institutional (private) residence as the place of occurrence of the external cause: Secondary | ICD-10-CM

## 2011-04-04 DIAGNOSIS — L309 Dermatitis, unspecified: Secondary | ICD-10-CM

## 2011-04-04 DIAGNOSIS — I9589 Other hypotension: Secondary | ICD-10-CM | POA: Diagnosis not present

## 2011-04-04 DIAGNOSIS — G8921 Chronic pain due to trauma: Secondary | ICD-10-CM | POA: Diagnosis not present

## 2011-04-04 DIAGNOSIS — G894 Chronic pain syndrome: Secondary | ICD-10-CM | POA: Diagnosis not present

## 2011-04-04 DIAGNOSIS — M545 Low back pain, unspecified: Secondary | ICD-10-CM | POA: Diagnosis not present

## 2011-04-04 DIAGNOSIS — T85695A Other mechanical complication of other nervous system device, implant or graft, initial encounter: Secondary | ICD-10-CM | POA: Diagnosis not present

## 2011-04-04 DIAGNOSIS — Z981 Arthrodesis status: Secondary | ICD-10-CM | POA: Diagnosis not present

## 2011-04-04 DIAGNOSIS — R079 Chest pain, unspecified: Secondary | ICD-10-CM | POA: Diagnosis not present

## 2011-04-04 DIAGNOSIS — M4000 Postural kyphosis, site unspecified: Secondary | ICD-10-CM | POA: Diagnosis present

## 2011-04-04 DIAGNOSIS — T84498A Other mechanical complication of other internal orthopedic devices, implants and grafts, initial encounter: Secondary | ICD-10-CM | POA: Diagnosis not present

## 2011-04-04 DIAGNOSIS — R0609 Other forms of dyspnea: Secondary | ICD-10-CM | POA: Diagnosis not present

## 2011-04-04 DIAGNOSIS — Z8249 Family history of ischemic heart disease and other diseases of the circulatory system: Secondary | ICD-10-CM | POA: Diagnosis not present

## 2011-04-04 DIAGNOSIS — K5900 Constipation, unspecified: Secondary | ICD-10-CM | POA: Diagnosis not present

## 2011-04-04 DIAGNOSIS — I10 Essential (primary) hypertension: Secondary | ICD-10-CM | POA: Diagnosis present

## 2011-04-04 DIAGNOSIS — Z4789 Encounter for other orthopedic aftercare: Secondary | ICD-10-CM | POA: Diagnosis not present

## 2011-04-04 DIAGNOSIS — M546 Pain in thoracic spine: Secondary | ICD-10-CM | POA: Diagnosis not present

## 2011-04-04 DIAGNOSIS — B029 Zoster without complications: Secondary | ICD-10-CM | POA: Diagnosis not present

## 2011-04-04 DIAGNOSIS — Z833 Family history of diabetes mellitus: Secondary | ICD-10-CM

## 2011-04-04 DIAGNOSIS — Y831 Surgical operation with implant of artificial internal device as the cause of abnormal reaction of the patient, or of later complication, without mention of misadventure at the time of the procedure: Secondary | ICD-10-CM | POA: Diagnosis present

## 2011-04-04 DIAGNOSIS — Z01812 Encounter for preprocedural laboratory examination: Secondary | ICD-10-CM | POA: Diagnosis not present

## 2011-04-04 DIAGNOSIS — F064 Anxiety disorder due to known physiological condition: Secondary | ICD-10-CM | POA: Diagnosis present

## 2011-04-04 DIAGNOSIS — L408 Other psoriasis: Secondary | ICD-10-CM | POA: Diagnosis not present

## 2011-04-04 DIAGNOSIS — E785 Hyperlipidemia, unspecified: Secondary | ICD-10-CM | POA: Diagnosis present

## 2011-04-04 DIAGNOSIS — Z8261 Family history of arthritis: Secondary | ICD-10-CM

## 2011-04-04 DIAGNOSIS — I517 Cardiomegaly: Secondary | ICD-10-CM | POA: Diagnosis not present

## 2011-04-04 DIAGNOSIS — N390 Urinary tract infection, site not specified: Secondary | ICD-10-CM | POA: Diagnosis present

## 2011-04-04 DIAGNOSIS — X58XXXA Exposure to other specified factors, initial encounter: Secondary | ICD-10-CM

## 2011-04-04 DIAGNOSIS — R0989 Other specified symptoms and signs involving the circulatory and respiratory systems: Secondary | ICD-10-CM | POA: Diagnosis not present

## 2011-04-04 DIAGNOSIS — E669 Obesity, unspecified: Secondary | ICD-10-CM | POA: Diagnosis present

## 2011-04-04 HISTORY — PX: SPINAL FUSION: SHX223

## 2011-04-04 LAB — PREPARE FRESH FROZEN PLASMA
Unit division: 0
Unit division: 0

## 2011-04-04 LAB — CARDIAC PANEL(CRET KIN+CKTOT+MB+TROPI)
CK, MB: 15.5 ng/mL (ref 0.3–4.0)
Relative Index: 0.8 (ref 0.0–2.5)
Total CK: 1871 U/L — ABNORMAL HIGH (ref 7–232)
Troponin I: <0.3 ng/mL (ref <0.30)

## 2011-04-04 LAB — POCT I-STAT 4, (NA,K, GLUC, HGB,HCT)
Glucose, Bld: 144 mg/dL — ABNORMAL HIGH (ref 70–99)
HCT: 34 % — ABNORMAL LOW (ref 39.0–52.0)
Hemoglobin: 11.6 g/dL — ABNORMAL LOW (ref 13.0–17.0)
Potassium: 4.6 mEq/L (ref 3.5–5.1)
Sodium: 138 mEq/L (ref 135–145)

## 2011-04-04 LAB — CBC
HCT: 38.6 % — ABNORMAL LOW (ref 39.0–52.0)
Hemoglobin: 12.8 g/dL — ABNORMAL LOW (ref 13.0–17.0)
MCH: 28.1 pg (ref 26.0–34.0)
MCHC: 33.2 g/dL (ref 30.0–36.0)
MCV: 84.8 fL (ref 78.0–100.0)
Platelets: 138 10*3/uL — ABNORMAL LOW (ref 150–400)
RBC: 4.55 MIL/uL (ref 4.22–5.81)
RDW: 15.7 % — ABNORMAL HIGH (ref 11.5–15.5)
WBC: 14.7 10*3/uL — ABNORMAL HIGH (ref 4.0–10.5)

## 2011-04-04 LAB — BASIC METABOLIC PANEL
BUN: 16 mg/dL (ref 6–23)
CO2: 25 mEq/L (ref 19–32)
Calcium: 8.5 mg/dL (ref 8.4–10.5)
Chloride: 106 mEq/L (ref 96–112)
Creatinine, Ser: 0.81 mg/dL (ref 0.50–1.35)
GFR calc Af Amer: >90 mL/min (ref >90)
GFR calc non Af Amer: >90 mL/min (ref >90)
Glucose, Bld: 142 mg/dL — ABNORMAL HIGH (ref 70–99)
Potassium: 4.7 mEq/L (ref 3.5–5.1)
Sodium: 137 mEq/L (ref 135–145)

## 2011-04-04 LAB — PROTIME-INR
INR: 1.08 (ref 0.00–1.49)
Prothrombin Time: 14.2 seconds (ref 11.6–15.2)

## 2011-04-04 LAB — APTT: aPTT: 32 seconds (ref 24–37)

## 2011-04-04 SURGERY — FUSION, SPINE, 2 OR MORE LEVELS, POSTERIOR APPROACH
Anesthesia: General | Site: Back | Wound class: Clean

## 2011-04-04 MED ORDER — SODIUM CHLORIDE 0.9 % IV SOLN
INTRAVENOUS | Status: DC | PRN
  Administered 2011-04-04 (×2): via INTRAVENOUS

## 2011-04-04 MED ORDER — HEMOSTATIC AGENTS (NO CHARGE) OPTIME
TOPICAL | Status: DC | PRN
  Administered 2011-04-04: 1 via TOPICAL

## 2011-04-04 MED ORDER — PHENOL 1.4 % MT LIQD: 1.0000 | OROMUCOSAL | Status: DC | PRN

## 2011-04-04 MED ORDER — ROCURONIUM BROMIDE 100 MG/10ML IV SOLN
INTRAVENOUS | Status: DC | PRN
  Administered 2011-04-04 (×4): 10 mg via INTRAVENOUS
  Administered 2011-04-04: 40 mg via INTRAVENOUS

## 2011-04-04 MED ORDER — HETASTARCH-ELECTROLYTES 6 % IV SOLN
INTRAVENOUS | Status: DC | PRN
  Administered 2011-04-04: 11:00:00 via INTRAVENOUS

## 2011-04-04 MED ORDER — DIPHENHYDRAMINE HCL 12.5 MG/5ML PO ELIX
12.5000 mg | ORAL_SOLUTION | Freq: Four times a day (QID) | ORAL | Status: DC | PRN
  Filled 2011-04-04: qty 5

## 2011-04-04 MED ORDER — ACETAMINOPHEN 10 MG/ML IV SOLN
1000.0000 mg | Freq: Four times a day (QID) | INTRAVENOUS | Status: DC
  Administered 2011-04-04: 1000 mg via INTRAVENOUS
  Filled 2011-04-04: qty 100

## 2011-04-04 MED ORDER — MORPHINE SULFATE 10 MG/ML IJ SOLN
INTRAMUSCULAR | Status: DC | PRN
  Administered 2011-04-04 (×2): 5 mg via INTRAVENOUS

## 2011-04-04 MED ORDER — CHLORHEXIDINE GLUCONATE 4 % EX LIQD: 60.0000 mL | Freq: Once | CUTANEOUS | Status: DC

## 2011-04-04 MED ORDER — ONDANSETRON HCL 4 MG/2ML IJ SOLN: 4.0000 mg | Freq: Four times a day (QID) | INTRAMUSCULAR | Status: DC | PRN

## 2011-04-04 MED ORDER — NALOXONE HCL 0.4 MG/ML IJ SOLN
0.4000 mg | INTRAMUSCULAR | Status: DC | PRN
  Administered 2011-04-06: 0.4 mg via INTRAVENOUS
  Filled 2011-04-04: qty 1

## 2011-04-04 MED ORDER — DIAZEPAM 5 MG/ML IJ SOLN: 5.0000 mg | Freq: Once | INTRAMUSCULAR | Status: DC

## 2011-04-04 MED ORDER — THROMBIN 20000 UNITS EX KIT: PACK | CUTANEOUS | Status: DC | PRN

## 2011-04-04 MED ORDER — MIDAZOLAM HCL 5 MG/5ML IJ SOLN
INTRAMUSCULAR | Status: DC | PRN
  Administered 2011-04-04: 2 mg via INTRAVENOUS

## 2011-04-04 MED ORDER — LABETALOL HCL 5 MG/ML IV SOLN
10.0000 mg | INTRAVENOUS | Status: DC | PRN
  Administered 2011-04-07: 10 mg via INTRAVENOUS
  Filled 2011-04-04 (×2): qty 4

## 2011-04-04 MED ORDER — OXYCODONE-ACETAMINOPHEN 5-325 MG PO TABS
1.0000 | ORAL_TABLET | ORAL | Status: DC | PRN
  Administered 2011-04-04 – 2011-04-05 (×4): 2 via ORAL
  Filled 2011-04-04 (×4): qty 2

## 2011-04-04 MED ORDER — ALBUMIN HUMAN 5 % IV SOLN
INTRAVENOUS | Status: DC | PRN
  Administered 2011-04-04: 12:00:00 via INTRAVENOUS

## 2011-04-04 MED ORDER — LACTATED RINGERS IV SOLN
INTRAVENOUS | Status: DC | PRN
  Administered 2011-04-04 (×4): via INTRAVENOUS

## 2011-04-04 MED ORDER — KCL IN DEXTROSE-NACL 20-5-0.45 MEQ/L-%-% IV SOLN
INTRAVENOUS | Status: DC
  Administered 2011-04-04 – 2011-04-05 (×2): via INTRAVENOUS
  Filled 2011-04-04 (×5): qty 1000

## 2011-04-04 MED ORDER — PROPOFOL 10 MG/ML IV EMUL
INTRAVENOUS | Status: DC | PRN
  Administered 2011-04-04: 75 ug/kg/min via INTRAVENOUS

## 2011-04-04 MED ORDER — OXYCODONE HCL 5 MG PO TABS
30.0000 mg | ORAL_TABLET | ORAL | Status: DC | PRN
  Administered 2011-04-04 – 2011-04-05 (×4): 30 mg via ORAL
  Filled 2011-04-04 (×4): qty 6

## 2011-04-04 MED ORDER — GLYCOPYRROLATE 0.2 MG/ML IJ SOLN
INTRAMUSCULAR | Status: DC | PRN
  Administered 2011-04-04: .6 mg via INTRAVENOUS

## 2011-04-04 MED ORDER — VANCOMYCIN HCL 1000 MG IV SOLR
1000.0000 mg | INTRAVENOUS | Status: AC
  Administered 2011-04-04: 1000 mg
  Filled 2011-04-04: qty 1000

## 2011-04-04 MED ORDER — SODIUM CHLORIDE 0.9 % IJ SOLN: 9.0000 mL | INTRAMUSCULAR | Status: DC | PRN

## 2011-04-04 MED ORDER — DIAZEPAM 5 MG/ML IJ SOLN
INTRAMUSCULAR | Status: AC
  Filled 2011-04-04: qty 2

## 2011-04-04 MED ORDER — SODIUM CHLORIDE 0.9 % IV SOLN: INTRAVENOUS | Status: DC

## 2011-04-04 MED ORDER — NALOXONE HCL 0.4 MG/ML IJ SOLN: 0.4000 mg | INTRAMUSCULAR | Status: DC | PRN

## 2011-04-04 MED ORDER — DIPHENHYDRAMINE HCL 50 MG/ML IJ SOLN: 12.5000 mg | Freq: Four times a day (QID) | INTRAMUSCULAR | Status: DC | PRN

## 2011-04-04 MED ORDER — HYDROMORPHONE HCL PF 1 MG/ML IJ SOLN: 0.5000 mg | Freq: Four times a day (QID) | INTRAMUSCULAR | Status: DC | PRN

## 2011-04-04 MED ORDER — HYDROMORPHONE 0.3 MG/ML IV SOLN
INTRAVENOUS | Status: DC
  Administered 2011-04-04: 19:00:00 via INTRAVENOUS
  Administered 2011-04-04: 5.4 mg via INTRAVENOUS
  Administered 2011-04-05: 4.5 mg via INTRAVENOUS
  Administered 2011-04-05: via INTRAVENOUS
  Administered 2011-04-05: 3.9 mg via INTRAVENOUS
  Administered 2011-04-05: 07:00:00 via INTRAVENOUS
  Filled 2011-04-04 (×3): qty 25

## 2011-04-04 MED ORDER — 0.9 % SODIUM CHLORIDE (POUR BTL) OPTIME
TOPICAL | Status: DC | PRN
  Administered 2011-04-04: 1000 mL

## 2011-04-04 MED ORDER — THROMBIN 20000 UNITS EX KIT
PACK | OROMUCOSAL | Status: DC | PRN
  Administered 2011-04-04: 09:00:00 via TOPICAL

## 2011-04-04 MED ORDER — SUFENTANIL CITRATE 50 MCG/ML IV SOLN
INTRAVENOUS | Status: DC | PRN
  Administered 2011-04-04: 50 ug via INTRAVENOUS

## 2011-04-04 MED ORDER — SODIUM CHLORIDE 0.9 % IR SOLN
Status: DC | PRN
  Administered 2011-04-04: 13:00:00

## 2011-04-04 MED ORDER — ONDANSETRON HCL 4 MG/2ML IJ SOLN
4.0000 mg | INTRAMUSCULAR | Status: DC | PRN
  Administered 2011-04-05 – 2011-04-06 (×2): 4 mg via INTRAVENOUS
  Filled 2011-04-04 (×2): qty 2

## 2011-04-04 MED ORDER — ACETAMINOPHEN 650 MG RE SUPP: 650.0000 mg | RECTAL | Status: DC | PRN

## 2011-04-04 MED ORDER — ONDANSETRON HCL 4 MG/2ML IJ SOLN
INTRAMUSCULAR | Status: DC | PRN
  Administered 2011-04-04: 4 mg via INTRAVENOUS

## 2011-04-04 MED ORDER — HYDROMORPHONE HCL PF 1 MG/ML IJ SOLN
INTRAMUSCULAR | Status: AC
  Filled 2011-04-04: qty 1

## 2011-04-04 MED ORDER — BUPIVACAINE LIPOSOME 1.3 % IJ SUSP
20.0000 mL | INTRAMUSCULAR | Status: AC
  Administered 2011-04-04: 20 mL
  Filled 2011-04-04: qty 20

## 2011-04-04 MED ORDER — HYDROMORPHONE HCL PF 1 MG/ML IJ SOLN
0.2500 mg | INTRAMUSCULAR | Status: DC | PRN
  Administered 2011-04-04 (×8): 0.5 mg via INTRAVENOUS

## 2011-04-04 MED ORDER — LIDOCAINE-EPINEPHRINE (PF) 1 %-1:200000 IJ SOLN
INTRAMUSCULAR | Status: DC | PRN
  Administered 2011-04-04: 14 mL

## 2011-04-04 MED ORDER — GENTAMICIN SULFATE 40 MG/ML IJ SOLN
INTRAMUSCULAR | Status: DC | PRN
  Administered 2011-04-04: 160 mg via INTRAMUSCULAR

## 2011-04-04 MED ORDER — NEOSTIGMINE METHYLSULFATE 1 MG/ML IJ SOLN
INTRAMUSCULAR | Status: DC | PRN
  Administered 2011-04-04: 3 mg via INTRAVENOUS

## 2011-04-04 MED ORDER — ONDANSETRON HCL 4 MG/2ML IJ SOLN: 4.0000 mg | Freq: Once | INTRAMUSCULAR | Status: DC | PRN

## 2011-04-04 MED ORDER — MENTHOL 3 MG MT LOZG
1.0000 | LOZENGE | OROMUCOSAL | Status: DC | PRN
  Filled 2011-04-04: qty 9

## 2011-04-04 MED ORDER — ACETAMINOPHEN 325 MG PO TABS
650.0000 mg | ORAL_TABLET | ORAL | Status: DC | PRN
  Administered 2011-04-09 – 2011-04-13 (×17): 325 mg via ORAL
  Administered 2011-04-13: 650 mg via ORAL
  Filled 2011-04-04: qty 2
  Filled 2011-04-04 (×7): qty 1
  Filled 2011-04-04: qty 2
  Filled 2011-04-04 (×9): qty 1

## 2011-04-04 MED ORDER — FENTANYL CITRATE 0.05 MG/ML IJ SOLN
INTRAMUSCULAR | Status: DC | PRN
  Administered 2011-04-04: 50 ug via INTRAVENOUS
  Administered 2011-04-04: 100 ug via INTRAVENOUS
  Administered 2011-04-04 (×9): 50 ug via INTRAVENOUS
  Administered 2011-04-04: 100 ug via INTRAVENOUS
  Administered 2011-04-04 (×4): 50 ug via INTRAVENOUS

## 2011-04-04 MED ORDER — SODIUM CHLORIDE 0.9 % IJ SOLN: 3.0000 mL | INTRAMUSCULAR | Status: DC | PRN

## 2011-04-04 MED ORDER — BUPIVACAINE HCL (PF) 0.25 % IJ SOLN
INTRAMUSCULAR | Status: DC | PRN
  Administered 2011-04-04: 14 mL

## 2011-04-04 MED ORDER — PROPOFOL 10 MG/ML IV EMUL
INTRAVENOUS | Status: DC | PRN
  Administered 2011-04-04: 200 mg via INTRAVENOUS

## 2011-04-04 SURGICAL SUPPLY — 108 items
ANCHOR CLICK (Anchor) ×1 IMPLANT
ANCHOR CLIK NEURO F/LEAD 2 (Anchor) ×1 IMPLANT
BLADE SURG 15 STRL LF DISP TIS (BLADE) ×1 IMPLANT
BLADE SURG 15 STRL SS (BLADE) ×1
BLADE SURG ROTATE 9660 (MISCELLANEOUS) IMPLANT
BUR MATCHSTICK NEURO 3.0 LAGG (BURR) ×2 IMPLANT
CARTRIDGE OIL MAESTRO DRILL (MISCELLANEOUS) ×1 IMPLANT
CLIK ANCHOR ×2 IMPLANT
CLOTH BEACON ORANGE TIMEOUT ST (SAFETY) ×2 IMPLANT
CONT SPECI 4OZ STER CLIK (MISCELLANEOUS) ×2 IMPLANT
CORDS BIPOLAR (ELECTRODE) ×2 IMPLANT
COVER SURGICAL LIGHT HANDLE (MISCELLANEOUS) ×2 IMPLANT
Carge narrow thoracic laminar hook ×2 IMPLANT
DECANTER SPIKE VIAL GLASS SM (MISCELLANEOUS) IMPLANT
DERMABOND ADVANCED (GAUZE/BANDAGES/DRESSINGS)
DERMABOND ADVANCED .7 DNX12 (GAUZE/BANDAGES/DRESSINGS) IMPLANT
DIFFUSER DRILL AIR PNEUMATIC (MISCELLANEOUS) ×2 IMPLANT
DRAIN CHANNEL 15F RND FF W/TCR (WOUND CARE) ×2 IMPLANT
DRAIN TLS ROUND 10FR (DRAIN) IMPLANT
DRAPE INCISE IOBAN 66X45 STRL (DRAPES) ×2 IMPLANT
DRAPE PROXIMA HALF (DRAPES) ×4 IMPLANT
DRAPE SURG 17X23 STRL (DRAPES) ×6 IMPLANT
DRAPE TABLE COVER HEAVY DUTY (DRAPES) IMPLANT
DRSG MEPILEX BORDER 4X12 (GAUZE/BANDAGES/DRESSINGS) IMPLANT
DRSG MEPILEX BORDER 4X4 (GAUZE/BANDAGES/DRESSINGS) IMPLANT
DRSG MEPILEX BORDER 4X8 (GAUZE/BANDAGES/DRESSINGS) ×2 IMPLANT
DRSG OPSITE 11X17.75 LRG (GAUZE/BANDAGES/DRESSINGS) IMPLANT
DURAPREP 26ML APPLICATOR (WOUND CARE) ×2 IMPLANT
ELECT BLADE 4.0 EZ CLEAN MEGAD (MISCELLANEOUS) ×2
ELECT CAUTERY BLADE 6.4 (BLADE) ×2 IMPLANT
ELECT REM PT RETURN 9FT ADLT (ELECTROSURGICAL) ×2
ELECTRODE BLDE 4.0 EZ CLN MEGD (MISCELLANEOUS) ×1 IMPLANT
ELECTRODE REM PT RTRN 9FT ADLT (ELECTROSURGICAL) ×1 IMPLANT
ELEVATER PASSER (SPINAL CORD STIMULATOR) ×2
EVACUATOR 1/8 PVC DRAIN (DRAIN) ×2 IMPLANT
EVACUATOR SILICONE 100CC (DRAIN) ×2 IMPLANT
Everest Polyaxial screw 6.5x45 ×2 IMPLANT
Everest Polyaxial screw 8.5x45 ×2 IMPLANT
GAUZE SPONGE 4X4 12PLY STRL LF (GAUZE/BANDAGES/DRESSINGS) ×2 IMPLANT
GAUZE SPONGE 4X4 16PLY XRAY LF (GAUZE/BANDAGES/DRESSINGS) ×4 IMPLANT
GENERATOR IPG (Generator) ×2 IMPLANT
GLOVE BIOGEL PI IND STRL 9 (GLOVE) ×1 IMPLANT
GLOVE BIOGEL PI INDICATOR 9 (GLOVE) ×1
GLOVE SS BIOGEL STRL SZ 8.5 (GLOVE) ×1 IMPLANT
GLOVE SUPERSENSE BIOGEL SZ 8.5 (GLOVE) ×1
GOWN PREVENTION PLUS XLARGE (GOWN DISPOSABLE) ×10 IMPLANT
GOWN STRL NON-REIN LRG LVL3 (GOWN DISPOSABLE) ×2 IMPLANT
HOOK PEDICLE SMALL (Hook) ×2 IMPLANT
KIT BASIN OR (CUSTOM PROCEDURE TRAY) ×2 IMPLANT
KIT POSITION SURG JACKSON T1 (MISCELLANEOUS) ×2 IMPLANT
KIT ROOM TURNOVER OR (KITS) ×2 IMPLANT
KIT STIMULAN RAPID CURE  10CC (Orthopedic Implant) ×1 IMPLANT
KIT STIMULAN RAPID CURE 10CC (Orthopedic Implant) ×1 IMPLANT
LEAD ARTISAN 50CM (Spinal Cord Stimulator) ×2 IMPLANT
MANIFOLD NEPTUNE II (INSTRUMENTS) ×2 IMPLANT
NEEDLE 22X1 1/2 (OR ONLY) (NEEDLE) ×2 IMPLANT
NEEDLE SPNL 22GX3.5 QUINCKE BK (NEEDLE) ×2 IMPLANT
NS IRRIG 1000ML POUR BTL (IV SOLUTION) ×2 IMPLANT
OIL CARTRIDGE MAESTRO DRILL (MISCELLANEOUS) ×2
PACK LAMINECTOMY ORTHO (CUSTOM PROCEDURE TRAY) ×2 IMPLANT
PACK UNIVERSAL I (CUSTOM PROCEDURE TRAY) ×2 IMPLANT
PAD ARMBOARD 7.5X6 YLW CONV (MISCELLANEOUS) ×4 IMPLANT
PASSER ELEVATOR (SPINAL CORD STIMULATOR) ×1 IMPLANT
PATTIES SURGICAL .5 X1 (DISPOSABLE) ×2 IMPLANT
PATTIES SURGICAL .75X.75 (GAUZE/BANDAGES/DRESSINGS) IMPLANT
PATTIES SURGICAL 1X1 (DISPOSABLE) IMPLANT
PENCIL BUTTON HOLSTER BLD 10FT (ELECTRODE) ×2 IMPLANT
PLUG IPG PORT (SPINAL CORD STIMULATOR) ×2 IMPLANT
PUTTY OP1 (Orthopedic Implant) ×2 IMPLANT
Polyaxial screw 5.5x50 everest ×6 IMPLANT
ROD STRAIGHT 500MM (Rod) ×4 IMPLANT
SCREW POLYAXIAL 6.5X50MM (Screw) ×2 IMPLANT
SCREW SET ATR (Screw) ×8 IMPLANT
SET SCREW (Screw) ×7 IMPLANT
SET SCREW VRST (Screw) ×7 IMPLANT
SPONGE LAP 18X18 X RAY DECT (DISPOSABLE) ×2 IMPLANT
SPONGE NEURO XRAY DETECT 1X3 (DISPOSABLE) ×2 IMPLANT
SPONGE SURGIFOAM ABS GEL 100 (HEMOSTASIS) ×2 IMPLANT
SPONGE SURGIFOAM ABS GEL 100C (HEMOSTASIS) ×4 IMPLANT
STAPLER VISISTAT (STAPLE) IMPLANT
SURGIFLO TRUKIT (HEMOSTASIS) ×2 IMPLANT
SUT ETHIBOND X763 2 0 SH 1 (SUTURE) ×2 IMPLANT
SUT ETHILON 2 0 FS 18 (SUTURE) ×20 IMPLANT
SUT VIC AB 0 CT1 18XCR BRD 8 (SUTURE) ×3 IMPLANT
SUT VIC AB 0 CT1 27 (SUTURE) ×2
SUT VIC AB 0 CT1 27XBRD ANBCTR (SUTURE) ×2 IMPLANT
SUT VIC AB 0 CT1 8-18 (SUTURE) ×3
SUT VIC AB 0 CTX 18 (SUTURE) ×2 IMPLANT
SUT VIC AB 1 CT1 18XCR BRD 8 (SUTURE) IMPLANT
SUT VIC AB 1 CT1 8-18 (SUTURE)
SUT VIC AB 1 CTX 18 (SUTURE) ×4 IMPLANT
SUT VIC AB 1 CTX 36 (SUTURE) ×2
SUT VIC AB 1 CTX36XBRD ANBCTR (SUTURE) ×2 IMPLANT
SUT VIC AB 2-0 CT1 18 (SUTURE) IMPLANT
SUT VIC AB 2-0 CT1 27 (SUTURE) ×2
SUT VIC AB 2-0 CT1 TAPERPNT 27 (SUTURE) ×2 IMPLANT
SUT VIC AB 3-0 X1 27 (SUTURE) IMPLANT
SYR BULB IRRIGATION 50ML (SYRINGE) ×2 IMPLANT
SYR CONTROL 10ML LL (SYRINGE) ×2 IMPLANT
SYSTEM CHEST DRAIN TLS 7FR (DRAIN) ×2 IMPLANT
TAPE CLOTH SURG 4X10 WHT LF (GAUZE/BANDAGES/DRESSINGS) ×2 IMPLANT
TOWEL OR 17X24 6PK STRL BLUE (TOWEL DISPOSABLE) ×2 IMPLANT
TOWEL OR 17X26 10 PK STRL BLUE (TOWEL DISPOSABLE) ×2 IMPLANT
TRAY FOLEY CATH 14FR (SET/KITS/TRAYS/PACK) ×2 IMPLANT
TUBE SUCT ARGYLE STRL (TUBING) ×2 IMPLANT
WATER STERILE IRR 1000ML POUR (IV SOLUTION) ×8 IMPLANT
WRENCH HEX 7.6 (SPINAL CORD STIMULATOR) ×2 IMPLANT
everest polyaxial screw 8.5x50 ×2 IMPLANT

## 2011-04-04 NOTE — Interval H&P Note (Signed)
Re-examined - no change from H&P

## 2011-04-04 NOTE — Anesthesia Postprocedure Evaluation (Signed)
  Anesthesia Post-op Note  Patient: Michael Lutz  Procedure(s) Performed:  FUSION POSTERIOR SPINAL MULTILEVEL/SCOLIOSIS - repair thoracic and lumbar  pseudoarthrosis, revise spinal cord stimulator, extend thoracic fusion, iliac crest bone graft  Patient Location: PACU  Anesthesia Type: General  Level of Consciousness: awake and alert   Airway and Oxygen Therapy: Patient Spontanous Breathing  Post-op Pain: mild  Post-op Assessment: Post-op Vital signs reviewed, Patient's Cardiovascular Status Stable, Respiratory Function Stable, Patent Airway, No signs of Nausea or vomiting and Pain level controlled  Post-op Vital Signs: stable  Complications: No apparent anesthesia complications

## 2011-04-04 NOTE — Preoperative (Signed)
Beta Blockers   Reason not to administer Beta Blockers:Not Applicable 

## 2011-04-04 NOTE — Anesthesia Preprocedure Evaluation (Addendum)
Anesthesia Evaluation  Patient identified by MRN, date of birth, ID band Patient awake    Reviewed: Allergy & Precautions, H&P , NPO status , Patient's Chart, lab work & pertinent test results, reviewed documented beta blocker date and time   Airway Mallampati: I TM Distance: >3 FB Neck ROM: full    Dental  (+) Edentulous Upper and Edentulous Lower   Pulmonary COPDCurrent Smoker,          Cardiovascular hypertension, + CAD and + Cardiac Stents regular Normal    Neuro/Psych Anxiety Depression    GI/Hepatic   Endo/Other    Renal/GU      Musculoskeletal  (+) Arthritis -, Osteoarthritis,    Abdominal   Peds  Hematology   Anesthesia Other Findings   Reproductive/Obstetrics                          Anesthesia Physical Anesthesia Plan  ASA: III  Anesthesia Plan: General ETT   Post-op Pain Management:    Induction: Intravenous  Airway Management Planned: Oral ETT  Additional Equipment: Arterial line  Intra-op Plan:   Post-operative Plan: Extubation in OR  Informed Consent: I have reviewed the patients History and Physical, chart, labs and discussed the procedure including the risks, benefits and alternatives for the proposed anesthesia with the patient or authorized representative who has indicated his/her understanding and acceptance.     Plan Discussed with: Anesthesiologist, CRNA and Surgeon  Anesthesia Plan Comments:         Anesthesia Quick Evaluation

## 2011-04-04 NOTE — Progress Notes (Signed)
Dr Alveda Reasons notified that patient is experiencing chest pain.  Plan to call hospitalist to see patient.  No other orders at this time.

## 2011-04-04 NOTE — Consult Note (Signed)
Reason for Consult: Chest pain. Referring Physician: Dr. Alveda Reasons. PCP - Dr.Miller of [facility] as per patient.  Michael Lutz is an 66 y.o. male.  HPI: 66 year-old male with history of hypertension, abdominal At[place]and recent status post repair who had a spine fusion surgery and was extubated at 5:30 PM today and patient complained of chest pain at around 6:30 PM. Patient did not have any associated shortness of breath nausea vomiting or diaphoresis. The pain only lasted fo a few minutes. Presently patient states he is chest pain-free. Patient is mildly drowsy from pain relief medication but he follows commands. Denies any shortness of breath abnormal pain. Medical consult was called for his chest pain evaluation. EKG done shows a normal sinus rhythm with no acute ST-T changes. I did speak to the surgeon Dr.Tooke and Dr. Alveda Reasons said there was 3800 cc of blood loss during surgery. Repeat hemoglobin after surgery was 11.  Past Medical History  Diagnosis Date  . Heart murmur     arrythmia  . Anxiety   . AAA (abdominal aortic aneurysm)   . Hyperlipidemia   . Hypertension   . Joint pain   . Leg pain 05-04-10    with walking  . History of shingles   . Peripheral vascular disease     s/p AAA stent  . Dysrhythmia     palpitations- sees dr Eldridge Dace  . Depression   . ADD (attention deficit disorder)   . Bipolar affective disorder     in remission on meds  . Chronic back pain     [facility]  . Colon polyps     Dr Matthias Hughs does a colonoscopy every 5 years  . Arthritis     degenerative spine    Past Surgical History  Procedure Date  . Spine surgery 12/2009    Spinal Fusion by Dr. Alveda Reasons  . Abdominal aortic aneurysm repair 11/2009    Stent graft repair  . Foot surgery     Multiple surgeries on right foot  . Shoulder surgery     Right shoulder  . Tonsillectomy   . Testicle surgery     right testicle  . Turp vaporization   . Cholecystectomy   . Back surgery     thoracic  spine  . Spinal cord stimulator implant 2009  . Vascular surgery     AAA stent repair  . Cardiac catheterization     15 yrs ago    Family History  Problem Relation Age of Onset  . Cancer Mother     melanoma  . Diabetes Father   . Arthritis Sister     rheumatoid    Social History:  reports that he has been smoking Cigars.  He does not have any smokeless tobacco history on file. He reports that he does not drink alcohol or use illicit drugs.  Allergies: No Known Allergies  Medications: I have reviewed his current medications.  Results for orders placed during the hospital encounter of 04/04/11 (from the past 48 hour(s))  PREPARE FRESH FROZEN PLASMA     Status: Normal (Preliminary result)   Collection Time   04/04/11 12:58 PM      Component Value Range Comment   Unit Number [id number]      Blood Component Type THAWED PLASMA      Unit division 00      Status of Unit ISSUED      Transfusion Status OK TO TRANSFUSE      Unit Number [id number]  Blood Component Type THAWED PLASMA      Unit division 00      Status of Unit ISSUED      Transfusion Status OK TO TRANSFUSE     POCT I-STAT 4, (NA,K, GLUC, HGB,HCT)     Status: Abnormal   Collection Time   04/04/11  2:09 PM      Component Value Range Comment   Sodium 138  135 - 145 (mEq/L)    Potassium 4.6  3.5 - 5.1 (mEq/L)    Glucose, Bld 144 (*) 70 - 99 (mg/dL)    HCT 62.1 (*) 30.8 - 52.0 (%)    Hemoglobin 11.6 (*) 13.0 - 17.0 (g/dL)     Dg Thoracolumabar Spine  04/04/2011  *RADIOLOGY REPORT*  Clinical Data: Extension of spinal fusion and stimulator replacement.  THORACOLUMBAR SPINE - 2 VIEW  Comparison: CT scan dated 11/01/2010  Findings: AP and lateral views demonstrate a spinal stimulator in place with what appears to be extension of the Harrington rods above the tip of the spinal stimulator.  Precise levels cannot be determined from these limited images.  IMPRESSION: Extension of posterior spinal fusion.  Stimulator in place.   Original Report Authenticated By: Gwynn Burly, M.D.   Dg Lumbar Spine 2-3 Views  04/04/2011  *RADIOLOGY REPORT*  Clinical Data: Lumbar osteotomy, thoracic extension and spinal stimulator replacement.  LUMBAR SPINE - 2-3 VIEW  Comparison: Thoracolumbar CT 11/01/2010.  Findings: Two views of the thoracolumbar spine demonstrate no apparent change in the bilateral pedicle screws extending from T9 through the upper sacrum.  Hardware appears intact.  There is an interbody spacer at L5-S1 which appears unchanged.  Small metallic densities posteriorly at T9-T10 may be related to the preexisting spinal stimulator which is no longer visualized.  Aortic stent graft and leftward extension of the spinal fusion into the left iliac bone appear unchanged. No localizing needle is identified.  IMPRESSION:  1.  Intraoperative views do not show any obvious change in the spinal hardware. 2.  Question interval removal of spinal stimulator.  There may be small remnants of the stimulator posteriorly at T9-T10.  Original Report Authenticated By: Gerrianne Scale, M.D.   Dg Lumbar Spine Complete  04/04/2011  *RADIOLOGY REPORT*  Clinical Data: Incorrect sponge count, thoracic extension of lumbar fixation and spinal stimulator adjustment.  LUMBAR SPINE - COMPLETE 4+ VIEW  Comparison: 04/04/2011  Findings: Four images were obtained of the lumbar spine and lower thoracic spine.  These demonstrate posterolateral rod and pedicle screw fixation of the lumbar spine with thoracic extension, spinal stimulator, various lead wires, nasogastric tube, and endotracheal tube.  There is likely some atelectasis in the right lung.  Spinal stimulator leads project over the dorsal portion of the lower thoracic spine.  Aortoiliac stent graft noted.  I do not observe an abnormal unexpected foreign body matching the missing [employer]-Tec sponge or other significant abnormal unexpected foreign body.  Extensive re-windowing was performed in order to visualize  all portions of the images.  IMPRESSION:  1.  No Ray-Tec spine show or other unexpected abnormal foreign body is observed.  Original Report Authenticated By: Dellia Cloud, M.D.    Review of Systems  Constitutional: Negative.   HENT: Negative.   Eyes: Negative.   Respiratory: Negative.   Cardiovascular: Positive for chest pain.  Gastrointestinal: Negative.   Genitourinary: Negative.   Musculoskeletal: Negative.   Skin: Negative.   Neurological: Negative.   Endo/Heme/Allergies: Negative.   Psychiatric/Behavioral: Negative.    Blood  pressure 172/123, pulse 85, temperature 98.9 F (37.2 C), temperature source Oral, resp. rate 17, SpO2 99.00%. Physical Exam  Constitutional: He appears well-developed. No distress.       Obese.  HENT:  Head: Normocephalic and atraumatic.  Right Ear: External ear normal.  Left Ear: External ear normal.  Mouth/Throat: No oropharyngeal exudate.  Eyes: Conjunctivae and EOM are normal. Pupils are equal, round, and reactive to light. Right eye exhibits no discharge. Left eye exhibits no discharge. No scleral icterus.  Neck: Normal range of motion. Neck supple.  Cardiovascular: Normal rate, regular rhythm and normal heart sounds.   Respiratory: Effort normal and breath sounds normal. No respiratory distress. He has no wheezes. He has no rales.  GI: Soft. Bowel sounds are normal. He exhibits no distension. There is no tenderness. There is no rebound.  Musculoskeletal: Normal range of motion. He exhibits no edema and no tenderness.  Neurological:       Drowsy but following commands. No obvious facial asymmetry. Moves all extremities.  Skin: Skin is warm and dry. No rash noted. He is not diaphoretic. No erythema.  Psychiatric: His behavior is normal.    Assessment/Plan: #1. Chest pain will rule out ACS - patient is presently chest pain-free. His EKG does not show anything acute. Patient does have risk factors include cigarette smoking, hypertension. We  will cycle cardiac markers. Get a portable chest x-ray and also recheck metabolic panel and CBC. If his chest pain recurs he may need a CAT scan angiogram of the chest. He also has uncontrolled blood pressure which may be contributing. And would keep patient on when necessary IV labetalol. #2. Uncontrolled hypertension - we'll continue his present home medication and keep patient on when necessary labetalol. #3. History of abdominal aortic aneurysm - presently has no abnormal pain.  Thanks for involving [place] in patient's care we will follow along with you.   Eduard Clos 04/04/2011, 8:14 PM

## 2011-04-04 NOTE — Brief Op Note (Signed)
04/04/2011  5:49 PM  PATIENT:  Okey Regal Dierks  66 y.o. male  PRE-OPERATIVE DIAGNOSIS:  post surgical arthodesis scliosis  POST-OPERATIVE DIAGNOSIS:  Same  PROCEDURE:  Repair pseudoarthroses thoracolumbar spine, extension of fusion to T7, Revision of segmental fixation, Replace thoracic Spinal cord Stimulator electrodes and internal pulse generator.  EBL: 3400 ml.  Transfused 1850 cell save blodd reinfused + 2 units of FFP  Moves all limbs in recovery room  No intraoperative complications  SURGEON:  Charlsie Quest, MD  PHYSICIAN ASSISTANT: E.M. Collins, PA-C  ANESTHESIA:   General  EXAM IN RECOVERY ROOM:

## 2011-04-04 NOTE — Addendum Note (Signed)
Addendum  created 04/04/11 2001 by Bedelia Person, MD   Modules edited:Anesthesia Events, Orders

## 2011-04-04 NOTE — Transfer of Care (Signed)
Immediate Anesthesia Transfer of Care Note  Patient: Michael Lutz  Procedure(s) Performed:  FUSION POSTERIOR SPINAL MULTILEVEL/SCOLIOSIS - repair thoracic and lumbar  pseudoarthrosis, revise spinal cord stimulator, extend thoracic fusion, iliac crest bone graft  Patient Location: PACU  Anesthesia Type: General  Level of Consciousness: awake, alert  and oriented  Airway & Oxygen Therapy: Patient Spontanous Breathing and Patient connected to face mask oxygen  Post-op Assessment: Report given to PACU RN  Post vital signs: Reviewed and stable  Complications: No apparent anesthesia complications

## 2011-04-04 NOTE — Anesthesia Procedure Notes (Signed)
Procedure Name: Intubation Date/Time: 04/04/2011 8:10 AM Performed by: Gwenyth Allegra Pre-anesthesia Checklist: Timeout performed, Patient identified, Emergency Drugs available, Suction available and Patient being monitored Patient Re-evaluated:Patient Re-evaluated prior to inductionOxygen Delivery Method: Circle System Utilized Preoxygenation: Pre-oxygenation with 100% oxygen Intubation Type: IV induction Ventilation: Mask ventilation without difficulty and Oral airway inserted - appropriate to patient size Grade View: Grade II Airway Equipment and Method: stylet Secured at: 22 cm Tube secured with: Tape

## 2011-04-04 NOTE — Addendum Note (Signed)
Addendum  created 04/04/11 2001 by Eriverto Byrnes, MD   Modules edited:Anesthesia Events, Orders    

## 2011-04-05 ENCOUNTER — Encounter (HOSPITAL_COMMUNITY): Payer: Self-pay | Admitting: Orthopedic Surgery

## 2011-04-05 ENCOUNTER — Other Ambulatory Visit (HOSPITAL_COMMUNITY): Payer: Managed Care, Other (non HMO)

## 2011-04-05 DIAGNOSIS — R079 Chest pain, unspecified: Secondary | ICD-10-CM | POA: Diagnosis not present

## 2011-04-05 DIAGNOSIS — I10 Essential (primary) hypertension: Secondary | ICD-10-CM | POA: Diagnosis not present

## 2011-04-05 LAB — CBC
HCT: 36.8 % — ABNORMAL LOW (ref 39.0–52.0)
Hemoglobin: 12.2 g/dL — ABNORMAL LOW (ref 13.0–17.0)
MCH: 28.2 pg (ref 26.0–34.0)
MCHC: 33.2 g/dL (ref 30.0–36.0)
MCV: 85 fL (ref 78.0–100.0)
Platelets: 146 10*3/uL — ABNORMAL LOW (ref 150–400)
RBC: 4.33 MIL/uL (ref 4.22–5.81)
RDW: 15.7 % — ABNORMAL HIGH (ref 11.5–15.5)
WBC: 15.4 10*3/uL — ABNORMAL HIGH (ref 4.0–10.5)

## 2011-04-05 LAB — BASIC METABOLIC PANEL
BUN: 15 mg/dL (ref 6–23)
CO2: 24 mEq/L (ref 19–32)
Calcium: 8.1 mg/dL — ABNORMAL LOW (ref 8.4–10.5)
Chloride: 105 mEq/L (ref 96–112)
Creatinine, Ser: 0.77 mg/dL (ref 0.50–1.35)
GFR calc Af Amer: >90 mL/min (ref >90)
GFR calc non Af Amer: >90 mL/min (ref >90)
Glucose, Bld: 147 mg/dL — ABNORMAL HIGH (ref 70–99)
Potassium: 4.3 mEq/L (ref 3.5–5.1)
Sodium: 137 mEq/L (ref 135–145)

## 2011-04-05 LAB — CARDIAC PANEL(CRET KIN+CKTOT+MB+TROPI)
CK, MB: 12.4 ng/mL (ref 0.3–4.0)
CK, MB: 7.7 ng/mL (ref 0.3–4.0)
Relative Index: 0.5 (ref 0.0–2.5)
Relative Index: 0.4 (ref 0.0–2.5)
Total CK: 2476 U/L — ABNORMAL HIGH (ref 7–232)
Total CK: 2174 U/L — ABNORMAL HIGH (ref 7–232)
Troponin I: <0.3 ng/mL (ref <0.30)
Troponin I: <0.3 ng/mL (ref <0.30)

## 2011-04-05 MED ORDER — SODIUM CHLORIDE 0.9 % IJ SOLN: 9.0000 mL | INTRAMUSCULAR | Status: DC | PRN

## 2011-04-05 MED ORDER — NIACIN 500 MG PO TABS
500.0000 mg | ORAL_TABLET | Freq: Every day | ORAL | Status: DC
  Administered 2011-04-06: 500 mg via ORAL
  Filled 2011-04-05 (×2): qty 1

## 2011-04-05 MED ORDER — NON FORMULARY: 150.0000 mg | Freq: Every day | Status: DC

## 2011-04-05 MED ORDER — SODIUM CHLORIDE 0.9 % IV SOLN
Freq: Once | INTRAVENOUS | Status: AC
  Administered 2011-04-05: via INTRAVENOUS

## 2011-04-05 MED ORDER — HYDROCHLOROTHIAZIDE 25 MG PO TABS
25.0000 mg | ORAL_TABLET | Freq: Every day | ORAL | Status: DC
  Administered 2011-04-05: 25 mg via ORAL
  Filled 2011-04-05 (×2): qty 1

## 2011-04-05 MED ORDER — LISINOPRIL 40 MG PO TABS
40.0000 mg | ORAL_TABLET | Freq: Every day | ORAL | Status: DC
  Administered 2011-04-05: 40 mg via ORAL
  Filled 2011-04-05 (×2): qty 1

## 2011-04-05 MED ORDER — POTASSIUM CHLORIDE IN NACL 20-0.9 MEQ/L-% IV SOLN
INTRAVENOUS | Status: DC
  Administered 2011-04-05 – 2011-04-09 (×4): via INTRAVENOUS
  Filled 2011-04-05 (×10): qty 1000

## 2011-04-05 MED ORDER — HYDROMORPHONE 0.3 MG/ML IV SOLN: INTRAVENOUS | Status: DC

## 2011-04-05 MED ORDER — QUETIAPINE FUMARATE 200 MG PO TABS
200.0000 mg | ORAL_TABLET | Freq: Every day | ORAL | Status: DC
  Administered 2011-04-05: 200 mg via ORAL
  Filled 2011-04-05 (×2): qty 1

## 2011-04-05 MED ORDER — AMLODIPINE BESYLATE 5 MG PO TABS
5.0000 mg | ORAL_TABLET | Freq: Every day | ORAL | Status: DC
  Administered 2011-04-05: 5 mg via ORAL
  Filled 2011-04-05 (×2): qty 1

## 2011-04-05 MED ORDER — DIPHENHYDRAMINE HCL 12.5 MG/5ML PO ELIX: 12.5000 mg | ORAL_SOLUTION | Freq: Four times a day (QID) | ORAL | Status: DC | PRN

## 2011-04-05 MED ORDER — NALOXONE HCL 0.4 MG/ML IJ SOLN: 0.4000 mg | INTRAMUSCULAR | Status: DC | PRN

## 2011-04-05 MED ORDER — LAMOTRIGINE 200 MG PO TABS
200.0000 mg | ORAL_TABLET | Freq: Every day | ORAL | Status: DC
  Administered 2011-04-05 – 2011-04-12 (×8): 200 mg via ORAL
  Filled 2011-04-05 (×9): qty 1

## 2011-04-05 MED ORDER — PANTOPRAZOLE SODIUM 40 MG PO TBEC
40.0000 mg | DELAYED_RELEASE_TABLET | Freq: Two times a day (BID) | ORAL | Status: DC
  Administered 2011-04-05 – 2011-04-13 (×16): 40 mg via ORAL
  Filled 2011-04-05 (×17): qty 1

## 2011-04-05 MED ORDER — EZETIMIBE 10 MG PO TABS
10.0000 mg | ORAL_TABLET | Freq: Every day | ORAL | Status: DC
  Administered 2011-04-05 – 2011-04-13 (×9): 10 mg via ORAL
  Filled 2011-04-05 (×9): qty 1

## 2011-04-05 MED ORDER — DOCUSATE SODIUM 100 MG PO CAPS
100.0000 mg | ORAL_CAPSULE | Freq: Two times a day (BID) | ORAL | Status: DC | PRN
  Administered 2011-04-07: 100 mg via ORAL
  Filled 2011-04-05: qty 1

## 2011-04-05 MED ORDER — GABAPENTIN 300 MG PO CAPS
300.0000 mg | ORAL_CAPSULE | Freq: Four times a day (QID) | ORAL | Status: DC
  Administered 2011-04-05 – 2011-04-13 (×33): 300 mg via ORAL
  Filled 2011-04-05 (×36): qty 1

## 2011-04-05 MED ORDER — SIMETHICONE 80 MG PO CHEW
80.0000 mg | CHEWABLE_TABLET | Freq: Four times a day (QID) | ORAL | Status: DC | PRN
  Administered 2011-04-07 – 2011-04-09 (×5): 80 mg via ORAL
  Filled 2011-04-05 (×6): qty 1

## 2011-04-05 MED ORDER — CLONAZEPAM 1 MG PO TABS
1.0000 mg | ORAL_TABLET | Freq: Two times a day (BID) | ORAL | Status: DC | PRN
  Administered 2011-04-06 – 2011-04-11 (×7): 1 mg via ORAL
  Filled 2011-04-05 (×8): qty 1

## 2011-04-05 MED ORDER — ONDANSETRON HCL 4 MG/2ML IJ SOLN: 4.0000 mg | Freq: Four times a day (QID) | INTRAMUSCULAR | Status: DC | PRN

## 2011-04-05 MED ORDER — DIPHENHYDRAMINE HCL 50 MG/ML IJ SOLN: 12.5000 mg | Freq: Four times a day (QID) | INTRAMUSCULAR | Status: DC | PRN

## 2011-04-05 MED ORDER — TRAZODONE HCL 150 MG PO TABS
400.0000 mg | ORAL_TABLET | Freq: Every day | ORAL | Status: DC
  Administered 2011-04-05 – 2011-04-12 (×8): 400 mg via ORAL
  Filled 2011-04-05 (×9): qty 1

## 2011-04-05 MED ORDER — HYDROMORPHONE 0.3 MG/ML IV SOLN
INTRAVENOUS | Status: DC
  Administered 2011-04-05: 13:00:00 via INTRAVENOUS
  Filled 2011-04-05: qty 25

## 2011-04-05 MED ORDER — VENLAFAXINE HCL 75 MG PO TABS
150.0000 mg | ORAL_TABLET | Freq: Every day | ORAL | Status: DC
  Administered 2011-04-05: 150 mg via ORAL
  Filled 2011-04-05 (×2): qty 2

## 2011-04-05 MED ORDER — METHOCARBAMOL 100 MG/ML IJ SOLN
500.0000 mg | Freq: Four times a day (QID) | INTRAMUSCULAR | Status: DC | PRN
  Administered 2011-04-05 (×2): 500 mg via INTRAVENOUS
  Filled 2011-04-05 (×4): qty 5

## 2011-04-05 MED ORDER — HYDROMORPHONE 0.3 MG/ML IV SOLN
INTRAVENOUS | Status: DC
  Administered 2011-04-05: 8.64 mg via INTRAVENOUS
  Administered 2011-04-05: 4.82 mg via INTRAVENOUS
  Administered 2011-04-05 (×2): via INTRAVENOUS
  Administered 2011-04-05: 6.93 mg via INTRAVENOUS
  Administered 2011-04-05: 4.27 mg via INTRAVENOUS
  Filled 2011-04-05 (×3): qty 25

## 2011-04-05 NOTE — Progress Notes (Signed)
Pt smokes several Black & Mild cigars per day. Discussed risk factors of smoking with pt at length. Pt verbalizes understanding how ever he is not ready to quit at this time. Pt in contemplation stage. Referred to 1-800 quit now for f/u and support. Discussed oral fixation substitutes, second hand smoke and in home smoking policy. Reviewed and gave pt Written education/contact information.

## 2011-04-05 NOTE — Progress Notes (Signed)
UR COMPLETED  

## 2011-04-05 NOTE — Evaluation (Signed)
Physical Therapy Evaluation Patient Details Name: Michael Lutz MRN: [id number] DOB: 03-07-1946 Today's Date: 04/05/2011  Problem List: There is no problem list on file for this patient.   Past Medical History:  Past Medical History  Diagnosis Date  . Heart murmur     arrythmia  . Anxiety   . AAA (abdominal aortic aneurysm)   . Hyperlipidemia   . Hypertension   . Joint pain   . Leg pain 05-04-10    with walking  . History of shingles   . Peripheral vascular disease     s/p AAA stent  . Dysrhythmia     palpitations- sees dr Eldridge Dace  . Depression   . ADD (attention deficit disorder)   . Bipolar affective disorder     in remission on meds  . Chronic back pain     [facility]  . Colon polyps     Dr Matthias Hughs does a colonoscopy every 5 years  . Arthritis     degenerative spine   Past Surgical History:  Past Surgical History  Procedure Date  . Spine surgery 12/2009    Spinal Fusion by Dr. Alveda Reasons  . Abdominal aortic aneurysm repair 11/2009    Stent graft repair  . Foot surgery     Multiple surgeries on right foot  . Shoulder surgery     Right shoulder  . Tonsillectomy   . Testicle surgery     right testicle  . Turp vaporization   . Cholecystectomy   . Back surgery     thoracic spine  . Spinal cord stimulator implant 2009  . Vascular surgery     AAA stent repair  . Cardiac catheterization     15 yrs ago    PT Assessment/Plan/Recommendation PT Assessment Clinical Impression Statement: Pt is 66 y/o male admitted for thoracic fusion repair and having difficulty controlling pain.  Pt will benefit from acute PT services to improve mobility, ambulation, review back precautions and overall endurance to prepare for safe d/c home with nephew. PT Recommendation/Assessment: Patient will need skilled PT in the acute care venue PT Problem List: Decreased activity tolerance;Decreased strength;Decreased balance;Decreased mobility;Decreased knowledge of use of  DME;Pain PT Therapy Diagnosis : Difficulty walking;Abnormality of gait;Generalized weakness;Acute pain PT Plan PT Frequency: Min 5X/week PT Treatment/Interventions: DME instruction;Gait training;Functional mobility training;Therapeutic activities;Stair training;Therapeutic exercise;Patient/family education PT Recommendation Follow Up Recommendations: Home health PT;Supervision/Assistance - 24 hour Equipment Recommended: None recommended by PT PT Goals  Acute Rehab PT Goals PT Goal Formulation: With patient Time For Goal Achievement: 7 days Pt will Roll Supine to Right Side: with modified independence PT Goal: Rolling Supine to Right Side - Progress: Goal set today Pt will go Supine/Side to Sit: with modified independence PT Goal: Supine/Side to Sit - Progress: Goal set today Pt will go Sit to Supine/Side: with modified independence PT Goal: Sit to Supine/Side - Progress: Goal set today Pt will go Sit to Stand: with modified independence PT Goal: Sit to Stand - Progress: Goal set today Pt will go Stand to Sit: with modified independence PT Goal: Stand to Sit - Progress: Goal set today Pt will Ambulate: >150 feet;with modified independence;with rolling walker PT Goal: Ambulate - Progress: Goal set today Pt will Go Up / Down Stairs: 3-5 stairs;with modified independence;with least restrictive assistive device PT Goal: Up/Down Stairs - Progress: Goal set today  PT Evaluation Precautions/Restrictions  Precautions Precautions:  (Simultaneous filing. User may not have seen previous data.) Precaution Booklet Issued: No Required Braces  or Orthoses: No Restrictions Weight Bearing Restrictions: No Prior Functioning  Home Living Lives With: Other (Comment) (nephew) Receives Help From: Other (Comment) (nephew) Type of Home: House Home Layout: One level Home Access: Stairs to enter Entrance Stairs-Rails: Can reach both Entrance Stairs-Number of Steps: 2 Bathroom Shower/Tub: Walk-in  Contractor: Handicapped height Bathroom Accessibility: Yes How Accessible: Accessible via walker Home Adaptive Equipment: Shower chair with back;Hand-held shower hose;Bedside commode/3-in-1;Walker - rolling;Quad cane;Grab bars around toilet;Grab bars in shower;Sock aid;Reacher Prior Function Level of Independence: Needs assistance with ADLs;Needs assistance with homemaking Able to Take Stairs?: Yes Driving: Yes Vocation: Retired Financial risk analyst Arousal/Alertness: Awake/alert Overall Cognitive Status: Appears within functional limits for tasks assessed Orientation Level: Oriented to person;Oriented to place;Oriented to situation;Oriented to time Sensation/Coordination Coordination Gross Motor Movements are Fluid and Coordinated: Yes Fine Motor Movements are Fluid and Coordinated: Yes Extremity Assessment RUE Assessment RUE Assessment: Within Functional Limits LUE Assessment LUE Assessment: Within Functional Limits RLE Assessment RLE Assessment: Not tested LLE Assessment LLE Assessment: Not tested Mobility (including Balance) Bed Mobility Bed Mobility: No Transfers Transfers: Yes Sit to Stand: 4: Min assist;From chair/3-in-1;With armrests (+2 helpful with lines) Sit to Stand Details (indicate cue type and reason): (A) to initiate transfer with max cues for hand placement Stand to Sit: 4: Min assist;To chair/3-in-1;Other (comment) Ambulation/Gait Ambulation/Gait: Yes Ambulation/Gait Assistance: 1: +2 Total assist;Patient percentage (comment) (pt 75%) Ambulation/Gait Assistance Details (indicate cue type and reason): (A) to maintain balance and +2 for safety and for lines.  Max cues for body position within RW and upright posture. Ambulation Distance (Feet): 15 Feet Assistive device: Rolling walker Gait Pattern: Shuffle Stairs: No Wheelchair Mobility Wheelchair Mobility: No  Balance Balance Assessed: Yes Static Standing Balance Static Standing -  Balance Support: Bilateral upper extremity supported Static Standing - Level of Assistance: 5: Stand by assistance Static Standing - Comment/# of Minutes: ~2 minutes prior to ambulation with supervision for safety Exercise    End of Session PT - End of Session Equipment Utilized During Treatment: Gait belt Activity Tolerance: Patient limited by pain Patient left: in chair;with call bell in reach Nurse Communication: Mobility status for ambulation;Mobility status for transfers General Behavior During Session: Restless (due to pain and needed max encouragement to ambulate) Cognition: WFL for tasks performed  Latamara Melder 04/05/2011, 2:02 PM [phone]

## 2011-04-05 NOTE — Progress Notes (Signed)
Agree with PT note. 04/05/2011 Martie Round, OTR/L Pager: [phone]

## 2011-04-05 NOTE — Progress Notes (Signed)
04-05-11 0315  Pt complaining of chest pain. Pt describing pain as stabbing and burning. Pt pointing to blister rash on chest from surgery. Performed at 12 lead EKG on pt. EKG states "ST with occasional PVC". Pt is repositioned and given pain medication and encouraged to use pain pump. Pt states that pain may have been a build up of gas due to the fact that he hasn't taken his home meds today.   Will update with any further changes.   Elisha Headland RN

## 2011-04-05 NOTE — Progress Notes (Signed)
  Physical Therapy Cancellation Patient Details Name: Michael Lutz MRN: [id number] DOB: [birth year] Today's Date: 04/05/2011  Pt just returned to bed after sitting up for 2 hours. Per RN pt having difficulty controling pain.  Will attempt this PM if time allows.  Thanks!!  [name] 04/05/2011, 10:01 AM [phone]

## 2011-04-05 NOTE — Progress Notes (Signed)
pt's pain manageable with robaxin iv and basal rate 1mg /hr of dilaudid pca will continue to monitor pt's pain. Beryle Quant

## 2011-04-05 NOTE — Progress Notes (Signed)
Seen in Rm. 3316 VS's stable Seen by hospitalist last night secondary to chest pain, felt unlikely to be cardiac Main problem this morning is pain control, Cannot lay down, He is sitting in chair  Good foot ankle dorsi/plantar flexion ++ blisters over presternal area secondary to prolonged positioning.   Ass't: poor pain control  Plan: add basal rate to PCA, add  Robasin IV, remain in 3300

## 2011-04-05 NOTE — Progress Notes (Signed)
04-05-11 1610  Have spoken with [facility] about pt JP drain. Jill Side Mahar states that she is okay with pt drain putting out 150cc in PACU, and 250cc since arrival at 3300. Mahar also states that although pt pain is uncontrolled to remind pt of "pain tolerance and management".   I suggested to [facility] about placing a CBC and BMP in the am to monitor pt progress. Mahar agreed and requested those orders be placed.   Elisha Headland RN

## 2011-04-05 NOTE — Consult Note (Signed)
[facility] CONSULT F/U NOTE Michael Lutz TEAM 8  Subjective: 66 year-old male with history of hypertension, hyperlipidemia, tob abuse and AAA status post stent Sept 2011 who had a spine fusion surgery and was extubated at 5:30 PM Tuesday.  Shortly thereafter (approx 6:30PM) the patient complained of chest pain. Patient did not have any associated shortness of breath nausea vomiting or diaphoresis. The pain only lasted a few minutes.   Today the pt denies any further chest discomfort, reporting that it was "mostly just in my stomach" and "just due to indigestion."  He c/o back pain, but denies f/c, sob, abdom pain.  Objective: Weight change:   Intake/Output Summary (Last 24 hours) at 04/05/11 1636 Last data filed at 04/05/11 1200  Gross per 24 hour  Intake   3016 ml  Output   2020 ml  Net    996 ml   Blood pressure 119/64, pulse 89, temperature 97.5 F (36.4 C), temperature source Oral, resp. rate 19, height 5\' 10"  (1.778 m), weight 130.2 kg (287 lb 0.6 oz), SpO2 94.00%.  Physical Exam: General: No acute respiratory distress Lungs: Clear to auscultation bilaterally without wheezes or crackles Cardiovascular: Regular rate and rhythm without gallop or rub - 2/6 holosystolic M Abdomen: Nontender, nondistended, soft, bowel sounds positive, no rebound, no ascites, no appreciable mass Extremities: No significant cyanosis, clubbing, or edema bilateral lower extremities  Lab Results:  Basename 04/05/11 0330 04/04/11 2019 04/04/11 1409  NA 137 137 138  K 4.3 4.7 4.6  CL 105 106 --  CO2 24 25 --  GLUCOSE 147* 142* 144*  BUN 15 16 --  CREATININE 0.77 0.81 --  CALCIUM 8.1* 8.5 --  MG -- -- --  PHOS -- -- --    Basename 04/05/11 0330 04/04/11 2019 04/04/11 1409  WBC 15.4* 14.7* --  NEUTROABS -- -- --  HGB 12.2* 12.8* 11.6*  HCT 36.8* 38.6* 34.0*  MCV 85.0 84.8 --  PLT 146* 138* --    Basename 04/05/11 1301 04/05/11 0330 04/04/11 2020  CKTOTAL 2174* 2476* 1871*  CKMB 7.7*  12.4* 15.5*  CKMBINDEX -- -- --  TROPONINI <0.30 <0.30 <0.30   Micro Results: Recent Results (from the past 240 hour(s))  SURGICAL PCR SCREEN     Status: Normal   Collection Time   03/31/11  4:28 PM      Component Value Range Status Comment   MRSA, PCR NEGATIVE  NEGATIVE  Final    Staphylococcus aureus NEGATIVE  NEGATIVE  Final     Studies/Results: All recent x-ray/radiology reports have been reviewed in detail.   Medications: I have reviewed the patient's complete medication list.  Recommendations:  S/p repair of multiple pseduoarthrosis  As per primary service (Ortho)  Chest pain Lexiscan study done on 11/01/09 showed normal myocardial perfusion - cardiac enzymes without significant positives x3 sets (elevated CK explained by recent surgery/CKMB component not signig elevated given high CK) - no further inpatient eval warranted at this time   AAA s/p stent Was seen by V[facility] Surgery in f/u 03/30/2011 with a clean report  HTN Well controlled at present  Hyperlipidemia Resume usual tx plan  Bipolar/depression Resume usual home meds  Disposition From a medical standpoint, pt could be safely transferred from the SDU to a med-surg bed   Lonia Blood, MD [facility] Office  [phone] Pager [phone]  On-Call/Text Page:      [email or web address]      password [facility]

## 2011-04-05 NOTE — Progress Notes (Signed)
CRITICAL VALUE ALERT  Critical value received:  CKMB 15.4  Date of notification:  04-04-11  Time of notification:  2130  Critical value read back: yes  Nurse who received alert:  Arlys John    MD notified (1st page):  Verlin Fester  Time of first page:  2130  MD notified (2nd page):  Time of second page:  Responding MD:  Verlin Fester   Time MD responded:  2145   No new orders written.

## 2011-04-05 NOTE — Progress Notes (Signed)
Occupational Therapy Evaluation Patient Details Name: Michael Lutz MRN: [id number] DOB: [birth year] Today's Date: 04/05/2011  Problem List: There is no problem list on file for this patient.   Past Medical History:  Past Medical History  Diagnosis Date  . Heart murmur     arrythmia  . Anxiety   . AAA (abdominal aortic aneurysm)   . Hyperlipidemia   . Hypertension   . Joint pain   . Leg pain 05-04-10    with walking  . History of shingles   . Peripheral vascular disease     s/p AAA stent  . Dysrhythmia     palpitations- sees dr Eldridge Dace  . Depression   . ADD (attention deficit disorder)   . Bipolar affective disorder     in remission on meds  . Chronic back pain     [facility]  . Colon polyps     Dr Matthias Hughs does a colonoscopy every 5 years  . Arthritis     degenerative spine   Past Surgical History:  Past Surgical History  Procedure Date  . Spine surgery 12/2009    Spinal Fusion by Dr. Alveda Reasons  . Abdominal aortic aneurysm repair 11/2009    Stent graft repair  . Foot surgery     Multiple surgeries on right foot  . Shoulder surgery     Right shoulder  . Tonsillectomy   . Testicle surgery     right testicle  . Turp vaporization   . Cholecystectomy   . Back surgery     thoracic spine  . Spinal cord stimulator implant 2009  . Vascular surgery     AAA stent repair  . Cardiac catheterization     15 yrs ago    OT Assessment/Plan/Recommendation OT Assessment Clinical Impression Statement: Pt admitted for spinal fusion complicated by chest pain.  Pt was assisted for ADL/IADL prior to admission, but unclear to what degree.  Pt lives with his nephew.  Pt requires encouragement to participate.  Complains of pain and nausea/hunger.  Pt requires min assist for mobility with a 2nd person for lines/safety.  He currently requires assist for bathing and dressing due to his pain and back precautions, set up for eating, grooming from a sitting position.   Expect pt will progress to be able to d/c home with 24 hour care and HHOT.  DME needs are met.  OT Recommendation/Assessment: Patient will need skilled OT in the acute care venue OT Problem List: Decreased activity tolerance;Impaired balance (sitting and/or standing);Decreased knowledge of use of DME or AE;Decreased knowledge of precautions;Obesity;Pain OT Therapy Diagnosis : Generalized weakness;Acute pain OT Plan OT Frequency: Min 2X/week OT Treatment/Interventions: Self-care/ADL training;DME and/or AE instruction;Patient/family education OT Recommendation Follow Up Recommendations: Home health OT;Supervision/Assistance - 24 hour Equipment Recommended: None recommended by OT Individuals Consulted Consulted and Agree with Results and Recommendations: Patient OT Goals Acute Rehab OT Goals OT Goal Formulation: With patient Time For Goal Achievement: 7 days ADL Goals Pt Will Perform Grooming: with supervision;Standing at sink;Other (comment) (2 activities) ADL Goal: Grooming - Progress: Goal set today Pt Will Perform Upper Body Bathing: with adaptive equipment;Sitting, edge of bed;with set-up ADL Goal: Upper Body Bathing - Progress: Goal set today Pt Will Perform Lower Body Bathing: with min assist;with adaptive equipment;Sitting, edge of bed;Sit to stand from bed ADL Goal: Lower Body Bathing - Progress: Goal set today Pt Will Perform Upper Body Dressing: with set-up;Sitting, bed ADL Goal: Upper Body Dressing - Progress: Goal set today Pt  Will Perform Lower Body Dressing: with min assist;with adaptive equipment;Sit to stand from bed;Sitting, bed ADL Goal: Lower Body Dressing - Progress: Goal set today Pt Will Transfer to Toilet: with supervision;3-in-1;Other (comment);Maintaining back safety precautions;Ambulation (3 in 1 over toilet) ADL Goal: Toilet Transfer - Progress: Goal set today Pt Will Perform Toileting - Clothing Manipulation: Standing;with supervision ADL Goal: Toileting -  Clothing Manipulation - Progress: Goal set today Pt Will Perform Toileting - Hygiene: with adaptive equipment;with modified independence;Other (comment) (AE as needed) ADL Goal: Toileting - Hygiene - Progress: Goal set today Miscellaneous OT Goals Miscellaneous OT Goal #1: Pt will generalize back precautions in ADL. OT Goal: Miscellaneous Goal #1 - Progress: Goal set today  OT Evaluation Precautions/Restrictions  Precautions Precautions:  Back, fall Precaution Booklet Issued: No Required Braces or Orthoses: No Restrictions Weight Bearing Restrictions: No Prior Functioning Home Living Lives With: Other (Comment) (nephew) Receives Help From: Other (Comment) (nephew) Type of Home: House Home Layout: One level Home Access: Stairs to enter Entrance Stairs-Rails: Can reach both Entrance Stairs-Number of Steps: 2 Bathroom Shower/Tub: Walk-in Contractor: Handicapped height Bathroom Accessibility: Yes How Accessible: Accessible via walker Home Adaptive Equipment: Shower chair with back;Hand-held shower hose;Bedside commode/3-in-1;Walker - rolling;Quad cane;Grab bars around toilet;Grab bars in shower;Sock aid;Reacher Prior Function Level of Independence: Needs assistance with ADLs;Needs assistance with homemaking Able to Take Stairs?: Yes Driving: Yes Vocation: Retired ADL ADL Eating/Feeding: Performed;Set up Where Assessed - Eating/Feeding: Chair Grooming: Performed;Denture care;Wash/dry face;Minimal assistance Where Assessed - Grooming: Sitting, chair Upper Body Bathing: Simulated;Moderate assistance Where Assessed - Upper Body Bathing: Sitting, chair Lower Body Bathing: Simulated;+1 Total assistance Where Assessed - Lower Body Bathing: Sitting, chair;Sit to stand from chair Upper Body Dressing: Simulated;Minimal assistance Where Assessed - Upper Body Dressing: Sitting, chair Lower Body Dressing: Simulated;+1 Total assistance Where Assessed - Lower Body  Dressing: Sitting, chair;Sit to stand from chair Toilet Transfer: Simulated;+2 Total assistance;Comment for patient % Toilet Transfer Method: Ambulating Equipment Used: Rolling walker Ambulation Related to ADLs: Pt ambulated with RW with +2 Total assist, pt 70 %. ADL Comments: Pt distracted by pain and nausea/hunger.  Pt states he has all necessary AE from previous back surgery.  Did not assess knowledge of use due to limited participation. Vision/Perception  Vision - History Baseline Vision: Wears glasses all the time Patient Visual Report: No change from baseline Cognition Cognition Arousal/Alertness: Awake/alert Overall Cognitive Status: Appears within functional limits for tasks assessed Orientation Level: Oriented to person;Oriented to place;Oriented to situation;Oriented to time Sensation/Coordination Coordination Gross Motor Movements are Fluid and Coordinated: Yes Fine Motor Movements are Fluid and Coordinated: Yes Extremity Assessment RUE Assessment RUE Assessment: Within Functional Limits LUE Assessment LUE Assessment: Within Functional Limits Mobility  Bed Mobility Bed Mobility: No Transfers Transfers: Yes Sit to Stand: 4: Min assist;From chair/3-in-1;With armrests;Other (comment) (2nd person for safety/lines) Stand to Sit: 4: Min assist;To chair/3-in-1;Other (comment) (2nd person for safety/lines) End of Session OT - End of Session Equipment Utilized During Treatment: Gait belt Activity Tolerance: Patient limited by pain Patient left: in chair;with call bell in reach General Behavior During Session: Other (comment) (needed encouragement to participate) Cognition: [facility] for tasks performed   Evern Bio 04/05/2011, 1:53 PM  [phone]

## 2011-04-06 ENCOUNTER — Inpatient Hospital Stay (HOSPITAL_COMMUNITY): Payer: Medicare Other

## 2011-04-06 DIAGNOSIS — I10 Essential (primary) hypertension: Secondary | ICD-10-CM | POA: Diagnosis not present

## 2011-04-06 DIAGNOSIS — R0609 Other forms of dyspnea: Secondary | ICD-10-CM | POA: Diagnosis not present

## 2011-04-06 DIAGNOSIS — R079 Chest pain, unspecified: Secondary | ICD-10-CM | POA: Diagnosis not present

## 2011-04-06 LAB — PROCALCITONIN: Procalcitonin: 0.1 ng/mL

## 2011-04-06 LAB — URINALYSIS, MICROSCOPIC ONLY
Bilirubin Urine: NEGATIVE
Glucose, UA: NEGATIVE mg/dL
Ketones, ur: NEGATIVE mg/dL
Nitrite: NEGATIVE
Protein, ur: NEGATIVE mg/dL
Specific Gravity, Urine: 1.015 (ref 1.005–1.030)
Urobilinogen, UA: 0.2 mg/dL (ref 0.0–1.0)
pH: 6.5 (ref 5.0–8.0)

## 2011-04-06 LAB — CBC
HCT: 29.3 % — ABNORMAL LOW (ref 39.0–52.0)
Hemoglobin: 9.8 g/dL — ABNORMAL LOW (ref 13.0–17.0)
MCH: 28.2 pg (ref 26.0–34.0)
MCHC: 33.4 g/dL (ref 30.0–36.0)
MCV: 84.2 fL (ref 78.0–100.0)
Platelets: 130 10*3/uL — ABNORMAL LOW (ref 150–400)
RBC: 3.48 MIL/uL — ABNORMAL LOW (ref 4.22–5.81)
RDW: 15.8 % — ABNORMAL HIGH (ref 11.5–15.5)
WBC: 14.5 10*3/uL — ABNORMAL HIGH (ref 4.0–10.5)

## 2011-04-06 LAB — BASIC METABOLIC PANEL
BUN: 12 mg/dL (ref 6–23)
CO2: 23 mEq/L (ref 19–32)
Calcium: 7.9 mg/dL — ABNORMAL LOW (ref 8.4–10.5)
Chloride: 102 mEq/L (ref 96–112)
Creatinine, Ser: 0.78 mg/dL (ref 0.50–1.35)
GFR calc Af Amer: >90 mL/min (ref >90)
GFR calc non Af Amer: >90 mL/min (ref >90)
Glucose, Bld: 138 mg/dL — ABNORMAL HIGH (ref 70–99)
Potassium: 4 mEq/L (ref 3.5–5.1)
Sodium: 133 mEq/L — ABNORMAL LOW (ref 135–145)

## 2011-04-06 LAB — LACTIC ACID, PLASMA: Lactic Acid, Venous: 1 mmol/L (ref 0.5–2.2)

## 2011-04-06 MED ORDER — HYDROMORPHONE HCL PF 1 MG/ML IJ SOLN
1.0000 mg | INTRAMUSCULAR | Status: DC | PRN
  Administered 2011-04-06 – 2011-04-11 (×18): 1 mg via INTRAVENOUS
  Filled 2011-04-06 (×18): qty 1

## 2011-04-06 MED ORDER — NALOXONE HCL 0.4 MG/ML IJ SOLN
0.4000 mg | Freq: Once | INTRAMUSCULAR | Status: AC
  Administered 2011-04-06: 0.4 mg via INTRAVENOUS

## 2011-04-06 MED ORDER — OXYCODONE HCL 20 MG PO TB12
30.0000 mg | ORAL_TABLET | Freq: Two times a day (BID) | ORAL | Status: AC
  Administered 2011-04-06: 30 mg via ORAL

## 2011-04-06 MED ORDER — SODIUM CHLORIDE 0.9 % IV SOLN: Freq: Once | INTRAVENOUS | Status: DC

## 2011-04-06 MED ORDER — HYDROCODONE-ACETAMINOPHEN 10-325 MG PO TABS
1.0000 | ORAL_TABLET | ORAL | Status: DC | PRN
  Administered 2011-04-06 – 2011-04-13 (×35): 1 via ORAL
  Filled 2011-04-06 (×35): qty 1

## 2011-04-06 MED ORDER — OXYCODONE HCL 20 MG PO TB12: 30.0000 mg | ORAL_TABLET | Freq: Two times a day (BID) | ORAL | Status: DC

## 2011-04-06 MED ORDER — POLYETHYLENE GLYCOL 3350 17 G PO PACK
17.0000 g | PACK | Freq: Two times a day (BID) | ORAL | Status: DC
  Administered 2011-04-06 – 2011-04-13 (×13): 17 g via ORAL
  Filled 2011-04-06 (×17): qty 1

## 2011-04-06 MED ORDER — LAMOTRIGINE 200 MG PO TABS: 200.0000 mg | ORAL_TABLET | Freq: Every day | ORAL | Status: DC

## 2011-04-06 MED ORDER — OXYCODONE HCL 40 MG PO TB12: 60.0000 mg | ORAL_TABLET | Freq: Two times a day (BID) | ORAL | Status: DC

## 2011-04-06 MED ORDER — OXYCODONE HCL 40 MG PO TB12
60.0000 mg | ORAL_TABLET | Freq: Two times a day (BID) | ORAL | Status: DC
  Administered 2011-04-06: 60 mg via ORAL
  Filled 2011-04-06: qty 1

## 2011-04-06 MED ORDER — VENLAFAXINE HCL ER 150 MG PO CP24
150.0000 mg | ORAL_CAPSULE | Freq: Every day | ORAL | Status: DC
Start: 2011-04-06 — End: 2011-04-13
  Administered 2011-04-06 – 2011-04-13 (×8): 150 mg via ORAL
  Filled 2011-04-06 (×8): qty 1

## 2011-04-06 MED ORDER — METHOCARBAMOL 500 MG PO TABS
500.0000 mg | ORAL_TABLET | ORAL | Status: DC
  Administered 2011-04-06 – 2011-04-13 (×43): 500 mg via ORAL
  Filled 2011-04-06 (×66): qty 1

## 2011-04-06 MED ORDER — MORPHINE SULFATE 4 MG/ML IJ SOLN
4.0000 mg | Freq: Once | INTRAMUSCULAR | Status: AC
  Administered 2011-04-06: 4 mg via INTRAVENOUS
  Filled 2011-04-06: qty 1

## 2011-04-06 MED ORDER — QUETIAPINE FUMARATE 200 MG PO TABS: 200.0000 mg | ORAL_TABLET | Freq: Every day | ORAL | Status: DC

## 2011-04-06 MED ORDER — HYDROMORPHONE BOLUS VIA INFUSION: 1.0000 mg | INTRAVENOUS | Status: DC | PRN

## 2011-04-06 MED ORDER — OXYCODONE HCL 20 MG PO TB12
30.0000 mg | ORAL_TABLET | Freq: Two times a day (BID) | ORAL | Status: DC
  Administered 2011-04-06: 30 mg via ORAL
  Filled 2011-04-06 (×2): qty 1

## 2011-04-06 MED ORDER — OXYCODONE HCL 20 MG PO TB12: 30.0000 mg | ORAL_TABLET | ORAL | Status: DC

## 2011-04-06 MED ORDER — TRAZODONE HCL 150 MG PO TABS: 400.0000 mg | ORAL_TABLET | Freq: Every day | ORAL | Status: DC

## 2011-04-06 MED ORDER — OXYCODONE HCL 5 MG PO TABS
20.0000 mg | ORAL_TABLET | Freq: Four times a day (QID) | ORAL | Status: DC | PRN
  Administered 2011-04-06: 20 mg via ORAL
  Filled 2011-04-06: qty 4

## 2011-04-06 MED ORDER — QUETIAPINE FUMARATE 300 MG PO TABS
300.0000 mg | ORAL_TABLET | Freq: Every day | ORAL | Status: DC
  Administered 2011-04-06 – 2011-04-12 (×7): 300 mg via ORAL
  Filled 2011-04-06 (×8): qty 1

## 2011-04-06 NOTE — Progress Notes (Signed)
Vs's stable C/O +++ pain  Last night's events noted.  Hypotension responded to cessation of narcotics and narcan.  He is now in severe, unmanaged pain.  D.Pharm note read and apprediated  O/E seated upright in chair, in obvious distress.  Dressing intact, Drains in situ         Moves feet and ankles  Ass't: Pain control is the issue, with an episode of hypotension last night associated with Narcotic intake  Plan:  Adopt D.Pharm plan - see orders  Discontinue Foley  Transfer to 5000  No need for body jacket, we will order a new one when the drains have been removed.

## 2011-04-06 NOTE — Progress Notes (Signed)
Pain Management Consult PharmD Communication Form   66 y/o male patient on long and short-acting opioids for h/o chronic back pain. Pain score range from 3-9 while admitted. Patient has been receiving combination of oxyir 30mg  q4h and prn, dilaudid pca, and percocet 5/325. Home medications include oxycontin 30mg  q4h ( I believe this is an error and would assume this to be oxyir 30mg  q4h as oxycontin is usually dosed every 8 or 12 hours), and norco 10/325 for breakthrough pain. Prior to this patient was receiving oxycontin 80mg  q8h. Patient has a high opioid tolerance. Would recommend titrating up to home dose with significant breakthrough pain control measures. Would start oxycontin 60mg  q12, norco 10/325 q4h prn for breakthrough pain and dilaudid 1mg  iv q4h prn for severe breakthrough pain. Goal to keep pain score 0-3. Thank you for the consult, will continue to follow.  Verlene Mayer, PharmD, BCPS Pager [phone]

## 2011-04-06 NOTE — Op Note (Signed)
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  OPERATIVE PROCEDURE:  Repair of pseudoarthrosis/fusion posteriorly at L5- S1; repair of pseudoarthroses at T9-10, T10-11, T11-12, T12-L1 (fusion at those levels); extension of fusion to (T7-8, T8-9, posterior fusion); revision of segmental fixation thoracolumbar fusion; revision/reimplantation of spinal cord stimulator thoracic electrodes via revision thoracic laminectomy at T9-10 and re-insertion of new internal pulse generator left low back area; harvest of left posterior iliac crest graft, admixture graft with OP-1.  OPERATIVE NOTE:  The patient was first assessed in the preop area.  He was then transferred to the operating room and placed under general endotracheal anesthesia.  Foley catheter was placed in the bladder. Sequential compression leggings were placed on both sides.  Neural monitoring devices were attached to upper extremities and scalp.  A 1.5 g of vancomycin was administered intravenously.  He was positioned prone on the Brenda frame.  Care was taken to position the upper extremities so as to avoid hyperflexion and abduction of the shoulders as well as to avoid hyperflexion of the elbows.  The upper extremities were padded with foam from axilla to hands.  Thighs, knees, shins and ankles were supported on pillows.  The previous midline incision scar was  outlined with a skin marker, and the vertical extension incision marked in the midline proximally about 6 cm.  The post side of left posterior iliac crest graft a harvest incision was also marked with a skin marker.  The skin was prepped with DuraPrep and draped in a rectangular fashion, and the drapes secured with Ioban.  A time-out was held during which the usual parameters were discussed/confirmed.  I did discuss with the anesthetist the need for using fresh frozen plasma once the blood loss was in the 1200-1500 mL range, which certainly was anticipated to exceed.  The previous incision was reincised into the dermis.  We then injected the subcutaneous tissue with a mixture of 0.25% plain Marcaine and 1% lidocaine with epinephrine.  Incision was then deepened down to the thoracolumbar fascia and extended through the thoracolumbar fascia.  The proximal part of the previous incision was initially exposed and the pedicle screws placed bilaterally at T9 were identified along with the rods.  We then mobilized the paraspinal muscle scar distally to expose the rods through their entire length down to S1 bilaterally, and on the left side the iliac screw/side connector coupling.  We then removed the left-sided rod.  The right side was similarly exposed and the right- sided rod removed.  We then extended the incision proximally to the upper lamina of T7.  The proximal thoracic  screws at T9 and T10 were loose and removed.  At this point, we did remove the thoracic electrodes from the spinal canal by carefully dissecting scar down T9-10 laminectomy, cutting the anchor sutures and pulling it out.  Not mentioned above is the fact that we actually started the operation by removing the internal pulse generator, previously placed on the left side in the low back area.  The previous incision was reincised, and the pulse generator was decoupled and removed.  This was done in a hope that we will be able  to use it again and to spare from the damaging effects of monopolar coagulation.  In fact, the monopolar coagulation/cutting was not used until the internal pulse generator had been removed.  I then provisionally closed that wound with 3 interrupted nylon sutures so that it would remain closed throughout the case until time for reinstallation of the IPG.  This was in fact the first thing we did at the procedure.  Having removed the thoracic electrodes and having removed both rods we ascertained that as expected the right-sided S1 screw was quite loose. The left-sided S1 screw was slightly loose but not grossly so. Ultimately, both those screws were replaced with 7.5 mm diameter screws. Having removed the rod from the left side, we exposed the L5-S1 facet joint, which on CT scan has not fused.  Articular surfaces were removed from both the inferior and superior articular process and the interval between the L5 transverse process and the ala of sacrum identified and scar removed and the surfaces of the bone exposed.  This was our subsequent site for graft placement at L5-S1.  We then worked our way proximally and exposed the facet joints at L4-5, L3-4, L2-3 and L1-2.  All this level appeared fused.  T12-L1, T11-12, T10-11, and T9-10 all appeared not fused with gross motion present at each level.  Ultimately, we placed down facing thoracic laminar hooks at T7 bilaterally and up facing pedicle hooks at T10 bilaterally.  In order to attach rods, this necessitated removal of the T11 screws bilaterally. We left no hardware at the level of the subsequent reinsertion of the thoracic electrodes.  Ultimately, we replaced all of the previously placed Denali screws, L3, L4, L5, and S1 bilaterally.  These were replaced with K2M Everest screws.  Similar size screws were placed at each level except at S1 where we increased the diameter to 8.5 mm.  The previously MESA screws remained in situ up to  the T12 level.  The facet joints were carefully prepared at the T7-8, T8-9, T9-10, T10- T11, T11-12 and T12-L1 levels of the pseudoarthrosis from the previous surgery.  We did harvest a left posterior iliac crest graft through a separate incision.  The gluteal fascia was detached from the outer edges of the iliac crest.  An acetabular reamer was used to remove moderate quantity of graft.  The wound was thoroughly irrigated and Gelfoam smeared into the bleeding bone surfaces.  The gluteal fascia was reattached with 3 interrupted figure-of-eight #1 Vicryl sutures.  The bone graft was then mixed with package of OP-1(BMP7).  We then contoured rods to extend on the right from the S1 screw to the down facing T7 laminar screw and on the left from the ilial screw right connector attachment through the down facing laminar hooks at T7.  The rods were then provisionally attached and eventually all couplings torqued and in case the MESA screws suggested final tightening.  Once we had placed the  rods, we placed the bone graft mixture in the facet joint area of each level bilaterally and at L5-S1 on the left side.  The previous surgery at L5-S1 had been transforaminal interbody fusion on the right side, and there really was no remaining fusion on and there was dense scar in between the transverse process of T5 in the ala of the sacrum.  Not mentioning the fact we stimulated all screws prior to attaching the rods and distal EMG activity was elicited in every instance at levels well above the fracture were confirmed.  AP and lateral radiographs were taken to include the entire construct and looked satisfactory. I had originally thought it would be necessary to perform an osteotomy at approximately L4 in order to get enough lordosis, but the degree of lordosis on the cross-table lateral looked satisfactory, and therefore we chose not to increase the magnitude of the surgery by doing a  lumbar osteotomy, and the belief that it was not necessary, or in my preoperative impression that it was.  We then, having reviewed the radiographs and placed the graft, locked the couplings etc., placed a 15-gauge Blake drain subfascially.  We then placed a Stimulan bead containing gentamicin and vancomycin in the wound.  The thoracolumbar fascia was then closed using multiple interrupted vertical mattress sutures of #1 Vicryl.  Prior to closing we did pass the electrode wires into the previously made incision where the internal pulse generator was removed.  The patient wished the pulse generator positioned a little more anteriorly.  I created a subcutaneous pocket anterior to the previous site of insertion.  The electrode wires were attached to a new pulse generator, because the [employer] rep was unable to ascertain that there was any activity in the previously placed and recently removed pulse generator.  It was tested and functioned.  It was then placed in the new pocket which was more anteriorly and the previous pocket obliterated using interrupted 0 Vicryl sutures in hopes of regaining posterior migration of the device. The subcutaneous tissue was closed using 0 Vicryl, and the skin was closed using running nylon suture.  The bone graft harvest subcutaneous layer of the wound was closed using interrupted 0 Vicryl sutures and skin closed using subcuticular 3-0 undyed Vicryl in running fashion with the edges reinforced with Dermabond.  We placed an one-eighth inch Hemovac drain in subcutaneous layer of the thoracolumbar wound and brought out through the skin to the right side besides the [place] drain, both of which were secured with nylon sutures.  The subcutaneous tissue of the thoracolumbar wound was then closed using interrupted 0 and 2-0 Vicryl sutures.  The skin was closed using a running 2-0 nylon suture in vertical mattress fashion. Dark dressings with antibiotic  ointment were applied and secured with OpSite.  The patient's blood loss estimated at 3400 mL.  1850 mL of Cell Saver blood were returned and the patient was also given 2 units of fresh frozen plasma at both the points when he lost 2 L of blood.  His postoperative hemoglobin was 12.8, and he was stable in the recovery room.  He was observed to develop chest blisters over the sternal area from prolonged positioning, not withstanding use of a gel pad.  He was able to move both feet and ankle in the recovery room.  He was breathing spontaneously and conscious.  There were no intraoperative complications.  The sponge and needle counts were correct     Nelda Severe, MD  MT/MEDQ  D:  04/05/2011  T:  04/06/2011  Job:  [id number]

## 2011-04-06 NOTE — Progress Notes (Addendum)
04/05/11 2315  Pt is experiencing hypotension. Pt SBP in 70s and 80s. [facility] who suggested that we contact [facility] who may be more "equiped" to handle the pt.   Called [place] with [facility] and she ordered a 500cc bolus of NS.   Will update with any changes.   Elisha Headland RN   04/06/11 0030  Another 500cc bolus ordered.   SBP back up into 90s.   Elisha Headland RN

## 2011-04-06 NOTE — Progress Notes (Signed)
Report called to receiving nurse on 5000.  Pt transferred to 5002 with belongings, brother-in-law at bedside.  Roselie Awkward, RN

## 2011-04-06 NOTE — Progress Notes (Signed)
04/06/11 0300  Pt has SBP in 50s. [facility] called. Rapid response called. Narcan given. Pt SBP came back up to mid 90s. PCA stopped per [place].   Pt put back to bed.   Elisha Headland RN

## 2011-04-06 NOTE — Progress Notes (Signed)
Physical Therapy Treatment Patient Details Name: Michael Lutz MRN: [id number] DOB: [birth year] Today's Date: 04/06/2011  PT Assessment/Plan  PT - Assessment/Plan Comments on Treatment Session: Pt still limited by pain throughout all mobility. Pt takes max encouragement for any movement, although described pain as decreased with ambulation. Difficulty to assess d/c plan pending progress PT Plan: Discharge plan remains appropriate;Frequency remains appropriate (possible D/c change pending progress) PT Frequency: Min 5X/week Follow Up Recommendations: Home health PT;Supervision/Assistance - 24 hour Equipment Recommended: None recommended by PT PT Goals  Acute Rehab PT Goals PT Goal Formulation: With patient PT Goal: Rolling Supine to Right Side - Progress: Progressing toward goal PT Goal: Supine/Side to Sit - Progress: Progressing toward goal PT Goal: Sit to Supine/Side - Progress: Progressing toward goal PT Goal: Sit to Stand - Progress: Progressing toward goal PT Goal: Stand to Sit - Progress: Progressing toward goal PT Goal: Ambulate - Progress: Progressing toward goal PT Goal: Up/Down Stairs - Progress: Not met  PT Treatment Precautions/Restrictions  Precautions Precautions: Back;Fall Precaution Booklet Issued: No Required Braces or Orthoses: No Restrictions Weight Bearing Restrictions: No Mobility (including Balance) Bed Mobility Bed Mobility: Yes Sit to Supine: 3: Mod assist;With rail Sit to Supine - Details (indicate cue type and reason): VC for sequencing to maintain back precautions. Assist with bilateral LEs into bed. Pt preferred to stay sidelying on right in bed Transfers Transfers: Yes Sit to Stand: 4: Min assist;From chair/3-in-1 Sit to Stand Details (indicate cue type and reason): VC for hand placement for safety. Assist with initiation, although pt able to stand with encouragement Stand to Sit: 4: Min assist;To bed Stand to Sit Details: VC for hand  placement Ambulation/Gait Ambulation/Gait: Yes Ambulation/Gait Assistance: 3: Mod assist Ambulation/Gait Assistance Details (indicate cue type and reason): Mod assist to maintain stability during ambulation. Assist x 2 for lines. Vc throughout for posture Ambulation Distance (Feet): 25 Feet Assistive device: Rolling walker Gait Pattern: Shuffle;Decreased stride length;Decreased hip/knee flexion - right;Decreased hip/knee flexion - left;Trunk flexed Gait velocity: Decreased gait speed Stairs: No    Exercise    End of Session PT - End of Session Equipment Utilized During Treatment: Gait belt Activity Tolerance: Patient limited by pain Patient left: in chair;with call bell in reach Nurse Communication: Mobility status for ambulation;Mobility status for transfers General Behavior During Session: Restless Cognition: [facility] for tasks performed  Milana Kidney 04/06/2011, 5:01 PM  04/06/2011 Milana Kidney DPT PAGER: [phone] OFFICE: [phone]

## 2011-04-06 NOTE — Consult Note (Signed)
[facility] CONSULT F/U NOTE [place] TEAM 8  Subjective: 66 year-old male with history of hypertension, hyperlipidemia, tob abuse and AAA status post stent Sept 2011 who had a spine fusion surgery and was extubated at 5:30 PM Tuesday.  Shortly thereafter (approx 6:30PM) the patient complained of chest pain. Patient did not have any associated shortness of breath nausea vomiting or diaphoresis. The pain only lasted a few minutes.   During the night this patient had significant problems related to symptomatic hypotension and oversedation. The oversedation responded to Narcan with subsequent increase in severity of pain. Patient received 1.5 L of IV fluids to treat the hypertension. This morning patient was complaining of level 10 out of 10 back pain. We cautiously reintroduced narcotics today and ask pharmacy to make recommendations since this patient has complex long-standing history of narcotic usage chronically. Since I initially evaluated this patient this morning these orders have been instituted by the orthopedic physician. Patient denied issues with shortness of breath or chest pain.  Objective: Weight change:   Intake/Output Summary (Last 24 hours) at 04/06/11 1438 Last data filed at 04/06/11 1400  Gross per 24 hour  Intake 4756.74 ml  Output   3525 ml  Net 1231.74 ml   Blood pressure 130/75, pulse 103, temperature 98 F (36.7 C), temperature source Oral, resp. rate 19, height 5\' 10"  (1.778 m), weight 130.2 kg (287 lb 0.6 oz), SpO2 98.00%.  Physical Exam: General: No acute respiratory distress Lungs: Clear to auscultation bilaterally without wheezes or crackles Cardiovascular: Regular rate and rhythm without gallop or rub - 2/6 holosystolic M Abdomen: Nontender, nondistended, soft, bowel sounds positive, no rebound, no ascites, no appreciable mass Extremities: No significant cyanosis, clubbing, or edema bilateral lower extremities  Lab Results:  Basename 04/06/11 0825  04/05/11 0330 04/04/11 2019  NA 133* 137 137  K 4.0 4.3 4.7  CL 102 105 106  CO2 23 24 25   GLUCOSE 138* 147* 142*  BUN 12 15 16   CREATININE 0.78 0.77 0.81  CALCIUM 7.9* 8.1* 8.5  MG -- -- --  PHOS -- -- --    Basename 04/06/11 0825 04/05/11 0330 04/04/11 2019  WBC 14.5* 15.4* 14.7*  NEUTROABS -- -- --  HGB 9.8* 12.2* 12.8*  HCT 29.3* 36.8* 38.6*  MCV 84.2 85.0 84.8  PLT 130* 146* 138*    Basename 04/05/11 1301 04/05/11 0330 04/04/11 2020  CKTOTAL 2174* 2476* 1871*  CKMB 7.7* 12.4* 15.5*  CKMBINDEX -- -- --  TROPONINI <0.30 <0.30 <0.30   Micro Results: Recent Results (from the past 240 hour(s))  SURGICAL PCR SCREEN     Status: Normal   Collection Time   03/31/11  4:28 PM      Component Value Range Status Comment   MRSA, PCR NEGATIVE  NEGATIVE  Final    Staphylococcus aureus NEGATIVE  NEGATIVE  Final     Studies/Results: All recent x-ray/radiology reports have been reviewed in detail.   Medications: I have reviewed the patient's complete medication list.  Recommendations:  Symptomatic hypotension During the night the patient had systolic blood pressure down to 53 with associated lethargy. This was likely multifactorial in etiology related to volume depletion as well as over oversedation. Patient has a history of being on chronic narcotic pain medications which were continued postoperatively. In addition to treat his acute pain from surgery he was started on labetalol and PCA. In review of his labs preadmission his hemoglobin was rather concentrated at 15 and review of labs from October  of 2012 demonstrate patient's baseline hemoglobin around 13. When further queried the patient admitted to poor by mouth intake and lack of appetite prior to admission. Further complicating the volume depletion was the fact that despite being on IV fluids the patient remained on thiazide diuretic. This morning IV fluids were continued and the diuretic discontinued. Also usual home  antihypertensive medications were placed on hold. Most recent systolic blood pressure is now up to 130 range.  S/p repair of multiple pseduoarthrosis  As per primary service (Ortho)  Acute on chronic pain We have asked pharmacy to assist in the management of this patient's pain medications given the events of last night. Current recommendations are to utilize OxyContin 60 mg every 12 hours with when necessary Norco 10/325 every 4 hours for breakthrough pain and when necessary dilaudid 1 mg IV Q4 hours for severe breakthrough pain. Goal is to keep pain score between 0 and 3.  Acute blood loss anemia, postoperative After hydration and surgery patient's hemoglobin has decreased from baseline of 13-9.8 today. No indications at this time for transfusion and we'll continue to monitor CBC closely. F/u on anemia panel.   Thrombocytopenia Patient's platelet count was normal preoperatively and is subtly drifted downward to 130,000. Suspect this is due to consumption from recent acute blood loss anemia. This patient has not been on heparin so do not suspect HIT.  Leukocytosis Is likely reactive from surgery. He does have a Foley catheter in place so we'll check a urinalysis and culture. Orthopedic surgery has ordered for the Foley to be discontinued today.  Chest pain Lexiscan study done on 11/01/09 showed normal myocardial perfusion - cardiac enzymes without significant positives x3 sets (elevated CK explained by recent surgery/CKMB component not signig elevated given high CK) - no further inpatient eval warranted at this time   AAA s/p stent Was seen by Vasc Surgery in f/u 03/30/2011 with a clean report  HTN Well controlled at present  Hyperlipidemia Resume usual tx plan  Bipolar/depression Resume usual home meds  Disposition From a medical standpoint, pt could be safely transferred from the SDU to a med-surg bed   Junious Silk, ANP [facility] Office  [phone] Pager  [phone]  On-Call/Text Page:      [email or web address]      password [email or web address]  I have examined the patient, reviewed the chart and modified the above note which I agree with.   Calvert Cantor, MD [phone]

## 2011-04-06 NOTE — Significant Event (Signed)
RN had called NP earlier in shift for low BP. 1000cc NS bolus given with little change in BP. Then, pt's pressure dropped to 53/40 (NP to bedside), but per doppler was 85 systolic. Upon entering room, pt is sitting up in a chair. He is oriented but very lethargic. He is on multiple narcotics. Narcan given. Dilaudid pump d/c'd. Percocet d/c'd. Oxy IR decreased to 20mg  (home dose 30mg ). I think his low BP is secondary to narcotics.  After Narcan and putting pt to bed, BP up to 98/60. Will follow. Maren Reamer, NP [facility]

## 2011-04-06 NOTE — Progress Notes (Signed)
Called and spoke to Michael Lutz, [place] about pt complaining about his pain management. Pt states that he is having chest pain that is radiating around to his back. Pt states that he his blisters to his chest that are a result of lying on operating table for 7hrs however the dressing is clean dry and intact when pt transport here from 3300 and he states that the dressing is fine. Gave pt prn pain medication and anxiety medication, readjusted pt and instructed him to call and notify me if his pain didn't subside. Will continue with the previous medication regimen that MD and pharmacy have at this time.

## 2011-04-06 NOTE — Procedures (Unsigned)
VASCULAR LAB EXAM  INDICATION:  Follow up AAA endograft placed in September 2011.  HISTORY: Diabetes:  No Cardiac:  Valve issues Hypertension:  Yes  EXAM:  AAA sac size:  2.9 cm AP, 3.3 cm TRV. Previous sac size:  By CT 05/05/2010, 4.3 cm AP, 3.7 cm TRV.  IMPRESSION: 1. Technically difficult due to patient body habitus and smoking prior     to exam. 2. The aorta and endograft appear patent. 3. Slight decrease in size of the aneurysmal sac surrounding the     endograft. 4. No evidence of endoleak was detected.  ___________________________________________ Janetta Hora. Fields, MD  SH/MEDQ  D:  03/30/2011  T:  03/30/2011  Job:  [id number]

## 2011-04-06 NOTE — Plan of Care (Signed)
Problem: Consults Goal: Diagnosis - Spinal Surgery Extension of previous lumbar surgery to T7

## 2011-04-07 DIAGNOSIS — R079 Chest pain, unspecified: Secondary | ICD-10-CM | POA: Diagnosis not present

## 2011-04-07 DIAGNOSIS — K5900 Constipation, unspecified: Secondary | ICD-10-CM | POA: Diagnosis not present

## 2011-04-07 DIAGNOSIS — I10 Essential (primary) hypertension: Secondary | ICD-10-CM | POA: Diagnosis not present

## 2011-04-07 LAB — CBC
HCT: 30.2 % — ABNORMAL LOW (ref 39.0–52.0)
Hemoglobin: 10 g/dL — ABNORMAL LOW (ref 13.0–17.0)
MCH: 27.9 pg (ref 26.0–34.0)
MCHC: 33.1 g/dL (ref 30.0–36.0)
MCV: 84.1 fL (ref 78.0–100.0)
Platelets: 151 10*3/uL (ref 150–400)
RBC: 3.59 MIL/uL — ABNORMAL LOW (ref 4.22–5.81)
RDW: 15.5 % (ref 11.5–15.5)
WBC: 12.9 10*3/uL — ABNORMAL HIGH (ref 4.0–10.5)

## 2011-04-07 LAB — BASIC METABOLIC PANEL
BUN: 10 mg/dL (ref 6–23)
CO2: 25 mEq/L (ref 19–32)
Calcium: 9 mg/dL (ref 8.4–10.5)
Chloride: 102 mEq/L (ref 96–112)
Creatinine, Ser: 0.57 mg/dL (ref 0.50–1.35)
GFR calc Af Amer: >90 mL/min (ref >90)
GFR calc non Af Amer: >90 mL/min (ref >90)
Glucose, Bld: 115 mg/dL — ABNORMAL HIGH (ref 70–99)
Potassium: 3.7 mEq/L (ref 3.5–5.1)
Sodium: 137 mEq/L (ref 135–145)

## 2011-04-07 LAB — DIFFERENTIAL
Basophils Absolute: 0 10*3/uL (ref 0.0–0.1)
Basophils Relative: 0 % (ref 0–1)
Eosinophils Absolute: 0.1 10*3/uL (ref 0.0–0.7)
Eosinophils Relative: 1 % (ref 0–5)
Lymphocytes Relative: 12 % (ref 12–46)
Lymphs Abs: 1.6 10*3/uL (ref 0.7–4.0)
Monocytes Absolute: 0.9 10*3/uL (ref 0.1–1.0)
Monocytes Relative: 7 % (ref 3–12)
Neutro Abs: 10.4 10*3/uL — ABNORMAL HIGH (ref 1.7–7.7)
Neutrophils Relative %: 80 % — ABNORMAL HIGH (ref 43–77)

## 2011-04-07 LAB — IRON AND TIBC
Iron: 14 ug/dL — ABNORMAL LOW (ref 42–135)
Saturation Ratios: 6 % — ABNORMAL LOW (ref 20–55)
TIBC: 238 ug/dL (ref 215–435)
UIBC: 224 ug/dL (ref 125–400)

## 2011-04-07 LAB — VITAMIN B12: Vitamin B-12: 258 pg/mL (ref 211–911)

## 2011-04-07 LAB — FERRITIN: Ferritin: 129 ng/mL (ref 22–322)

## 2011-04-07 LAB — RETICULOCYTES
RBC.: 3.02 MIL/uL — ABNORMAL LOW (ref 4.22–5.81)
Retic Count, Absolute: 105.7 10*3/uL (ref 19.0–186.0)
Retic Ct Pct: 3.5 % — ABNORMAL HIGH (ref 0.4–3.1)

## 2011-04-07 LAB — FOLATE: Folate: 16.5 ng/mL

## 2011-04-07 MED ORDER — DOCUSATE SODIUM 100 MG PO CAPS
100.0000 mg | ORAL_CAPSULE | Freq: Two times a day (BID) | ORAL | Status: DC
  Administered 2011-04-07 – 2011-04-13 (×13): 100 mg via ORAL
  Filled 2011-04-07 (×14): qty 1

## 2011-04-07 MED ORDER — BISACODYL 10 MG RE SUPP
10.0000 mg | Freq: Every day | RECTAL | Status: AC | PRN
  Administered 2011-04-07: 10 mg via RECTAL
  Filled 2011-04-07: qty 1

## 2011-04-07 MED ORDER — OXYCODONE HCL 40 MG PO TB12
80.0000 mg | ORAL_TABLET | Freq: Two times a day (BID) | ORAL | Status: DC
  Administered 2011-04-07 – 2011-04-13 (×13): 80 mg via ORAL
  Filled 2011-04-07 (×13): qty 2

## 2011-04-07 MED ORDER — SENNA 8.6 MG PO TABS
1.0000 | ORAL_TABLET | Freq: Every day | ORAL | Status: DC
  Administered 2011-04-07: 8.6 mg via ORAL
  Filled 2011-04-07 (×2): qty 1

## 2011-04-07 MED ORDER — FLEET ENEMA 7-19 GM/118ML RE ENEM
1.0000 | ENEMA | Freq: Every day | RECTAL | Status: AC | PRN
  Administered 2011-04-07: 1 via RECTAL
  Filled 2011-04-07: qty 1

## 2011-04-07 MED FILL — Heparin Sodium (Porcine) Inj 1000 Unit/ML: INTRAMUSCULAR | Qty: 30 | Status: AC

## 2011-04-07 MED FILL — Sodium Chloride IV Soln 0.9%: INTRAVENOUS | Qty: 4000 | Status: AC

## 2011-04-07 NOTE — Progress Notes (Signed)
PT Cancellation Note  Treatment cancelled today due to patient's refusal to participate.  He reports he feels like his chest is about to explode.  He refused to have HOB raised to take meds by nursing (comfortable at [facility] at 12 degrees only).  Nursing giving him meds for constipation and pain.  Explained to pt the importance of moving around.  Will attempt Sat as pt allows.  Judson Roch 04/07/2011, 9:53 AM

## 2011-04-07 NOTE — Progress Notes (Signed)
[facility] CONSULT F/U NOTE [place] TEAM 8  Subjective: 66 year-old male with history of hypertension, hyperlipidemia, tob abuse and AAA status post stent Sept 2011 who had a spine fusion surgery and was extubated at 5:30 PM Tuesday.  Shortly thereafter (approx 6:30PM) the patient complained of chest pain. Patient did not have any associated shortness of breath nausea vomiting or diaphoresis. The pain only lasted a few minutes.   During the night this patient had significant problems related to symptomatic hypotension and oversedation. The oversedation responded to Narcan with subsequent increase in severity of pain. Patient received 1.5 L of IV fluids to treat the hypertension.   Patient still complains of pain under the ribs , especially on movement. Patient had a small bowel movement this morning.  Objective: Weight change:   Intake/Output Summary (Last 24 hours) at 04/07/11 1107 Last data filed at 04/07/11 0600  Gross per 24 hour  Intake   1425 ml  Output 2982.5 ml  Net -1557.5 ml   Blood pressure 110/70, pulse 106, temperature 98 F (36.7 C), temperature source Oral, resp. rate 20, height 5\' 10"  (1.778 m), weight 130.2 kg (287 lb 0.6 oz), SpO2 95.00%.  Physical Exam: General: No acute respiratory distress Lungs: Clear to auscultation bilaterally without wheezes or crackles Cardiovascular: S1S2 Regular Abdomen: Nontender, mild distension, no mass Extremities: No significant cyanosis, clubbing, or edema bilateral lower extremities  Lab Results:  Basename 04/06/11 0825 04/05/11 0330 04/04/11 2019  NA 133* 137 137  K 4.0 4.3 4.7  CL 102 105 106  CO2 23 24 25   GLUCOSE 138* 147* 142*  BUN 12 15 16   CREATININE 0.78 0.77 0.81  CALCIUM 7.9* 8.1* 8.5  MG -- -- --  PHOS -- -- --    Basename 04/06/11 0825 04/05/11 0330 04/04/11 2019  WBC 14.5* 15.4* 14.7*  NEUTROABS -- -- --  HGB 9.8* 12.2* 12.8*  HCT 29.3* 36.8* 38.6*  MCV 84.2 85.0 84.8  PLT 130* 146* 138*     Basename 04/05/11 1301 04/05/11 0330 04/04/11 2020  CKTOTAL 2174* 2476* 1871*  CKMB 7.7* 12.4* 15.5*  CKMBINDEX -- -- --  TROPONINI <0.30 <0.30 <0.30   Micro Results: Recent Results (from the past 240 hour(s))  SURGICAL PCR SCREEN     Status: Normal   Collection Time   03/31/11  4:28 PM      Component Value Range Status Comment   MRSA, PCR NEGATIVE  NEGATIVE  Final    Staphylococcus aureus NEGATIVE  NEGATIVE  Final     Studies/Results: All recent x-ray/radiology reports have been reviewed in detail.   Medications: I have reviewed the patient's complete medication list.  Recommendations:  Symptomatic hypotension BP stable. On Iv fluids   S/p repair of multiple pseduoarthrosis  As per primary service (Ortho)  Acute on chronic pain Continue Oxycontin 80 mg po q 12 hr Norco 12/3249 tab po Q 4 hr prn pain  Acute blood loss anemia, postoperative After hydration and surgery patient's hemoglobin has decreased from baseline of 13-9.8 today.  No indications at this time for transfusion. Will recheck CBC in am  Thrombocytopenia Platelet count 138,000 Check CBC in am  Leukocytosis UA is normal Urine culture is pending.  Constipation: Start Colace 100 mg po BID Also start senna tab daily.   Chest pain Lexiscan study done on 11/01/09 showed normal myocardial perfusion  More ? Muscular, on movement. Continue pain meds.  AAA s/p stent Was seen by Vasc Surgery in f/u 03/30/2011 with  a clean report  HTN Well controlled at present  Hyperlipidemia Resume usual tx plan  Bipolar/depression Resume usual home meds  Meredeth Ide [phone]

## 2011-04-07 NOTE — Progress Notes (Signed)
Seen in room 5002  S; complaining of pain, hada small BM, passing flatus, has had supp's and enema, DPharm note reviewed  O: alert oriented, [place] Drain reservoir has 75 cc sanguinous drainage accumulated since about 6AM  A: Pain better controlled than yesterday, Bowel function not yet normal  Plan: Senokot, CBC, lytes tomorrow.

## 2011-04-07 NOTE — Progress Notes (Signed)
OT Cancellation Note  Treatment cancelled today due to patient's refusal to participate. Pt had just fallen asleep and family member requested to not be seen at this time.  [facility] Celica Kotowski, OTR/L  [phone] 04/07/2011 04/07/2011, 2:24 PM

## 2011-04-07 NOTE — Progress Notes (Signed)
Occupational Therapy Treatment Patient Details Name: RAYBON CONARD MRN: [id number] DOB: [birth year] Today's Date: 04/07/2011  OT Assessment/Plan OT Assessment/Plan Comments on Treatment Session: Pt continues to progress, but slowly due to pain level. OT Plan: Discharge plan remains appropriate OT Frequency: Min 2X/week Follow Up Recommendations: Home health OT;Supervision/Assistance - 24 hour Equipment Recommended: None recommended by OT OT Goals Acute Rehab OT Goals OT Goal Formulation: With patient ADL Goals Pt Will Perform Grooming: with supervision;Standing at sink;Other (comment) ADL Goal: Grooming - Progress: Not progressing Pt Will Perform Upper Body Dressing: with set-up;Sitting, bed ADL Goal: Upper Body Dressing - Progress: Progressing toward goals Pt Will Transfer to Toilet: with supervision;3-in-1;Other (comment);Maintaining back safety precautions;Ambulation ADL Goal: Toilet Transfer - Progress: Progressing toward goals Pt Will Perform Toileting - Hygiene: with adaptive equipment;with modified independence;Other (comment) ADL Goal: Toileting - Hygiene - Progress: Not progressing Miscellaneous OT Goals Miscellaneous OT Goal #1: Pt will generalize back precautions in ADL. OT Goal: Miscellaneous Goal #1 - Progress: Progressing toward goals  OT Treatment Precautions/Restrictions  Precautions Precautions: Back;Fall Precaution Booklet Issued: No Required Braces or Orthoses: No (no order for brace, nephew brought TLSO from prior sx) Restrictions Weight Bearing Restrictions: No   ADL ADL Grooming: Performed;Wash/dry face;Set up Where Assessed - Grooming: Sitting, bed Upper Body Dressing: Performed;Minimal assistance Where Assessed - Upper Body Dressing: Sitting, bed Toilet Transfer: Simulated;Minimal assistance Toilet Transfer Method: Stand pivot;Other (comment) (bed to chair from raised bed, pillow in chair) Toileting - Hygiene: Performed;+1 Total assistance Where  Assessed - Toileting Hygiene: Rolling right and/or left;Other (comment) (from bedpan) Equipment Used: Rolling walker ADL Comments: Pt not interested or able to attend to LB ADL training due to pain. Mobility  Bed Mobility Bed Mobility: Yes Sit to Supine: 4: Min assist;With rail;HOB flat Sit to Supine - Details (indicate cue type and reason): used log roll technique Transfers Transfers: Yes Sit to Stand: 4: Min assist;With upper extremity assist;From bed;From elevated surface Sit to Stand Details (indicate cue type and reason): cues for hand placement Stand to Sit: 4: Min assist;With upper extremity assist;To chair/3-in-1 Stand to Sit Details: verbal cues for hand placement  End of Session OT - End of Session Activity Tolerance: Patient limited by pain Patient left: in chair;with family/visitor present Nurse Communication: Mobility status for transfers General Behavior During Session: [facility] for tasks performed Cognition: [facility] for tasks performed  Evern Bio  04/07/2011, 2:17 PM [phone]

## 2011-04-07 NOTE — Progress Notes (Signed)
Pain Management Consult per Rx  66 y/o male patient on long and short-acting opioids for h/o chronic back pain.  Pain score has been 4 to 9 over 10 since starting pain regimen per pharmacy.  Patient is currently on Oxycontin 60mg  po q12h, norco 10/325 po q4hprn breakthrough pain and dilaudid 1mg  iv q4hprn for severe breakthrough pain.  Yesterday, patient required about 2 x dilaudid prn and 5 tabs of norco.  Respiratory rate was 15-23.  1) Change oxycontin to 80mg  po q12h (hold for oversedation or respiratory distress 2) Monitor pain scores.

## 2011-04-08 DIAGNOSIS — R079 Chest pain, unspecified: Secondary | ICD-10-CM | POA: Diagnosis not present

## 2011-04-08 DIAGNOSIS — I10 Essential (primary) hypertension: Secondary | ICD-10-CM | POA: Diagnosis not present

## 2011-04-08 DIAGNOSIS — K5900 Constipation, unspecified: Secondary | ICD-10-CM | POA: Diagnosis not present

## 2011-04-08 LAB — CBC
HCT: 26.9 % — ABNORMAL LOW (ref 39.0–52.0)
HCT: 27 % — ABNORMAL LOW (ref 39.0–52.0)
Hemoglobin: 9 g/dL — ABNORMAL LOW (ref 13.0–17.0)
Hemoglobin: 8.9 g/dL — ABNORMAL LOW (ref 13.0–17.0)
MCH: 28.5 pg (ref 26.0–34.0)
MCH: 28 pg (ref 26.0–34.0)
MCHC: 33.5 g/dL (ref 30.0–36.0)
MCHC: 33 g/dL (ref 30.0–36.0)
MCV: 85.1 fL (ref 78.0–100.0)
MCV: 84.9 fL (ref 78.0–100.0)
Platelets: 155 10*3/uL (ref 150–400)
Platelets: 170 10*3/uL (ref 150–400)
RBC: 3.16 MIL/uL — ABNORMAL LOW (ref 4.22–5.81)
RBC: 3.18 MIL/uL — ABNORMAL LOW (ref 4.22–5.81)
RDW: 15.8 % — ABNORMAL HIGH (ref 11.5–15.5)
RDW: 15.7 % — ABNORMAL HIGH (ref 11.5–15.5)
WBC: 9.7 10*3/uL (ref 4.0–10.5)
WBC: 11 10*3/uL — ABNORMAL HIGH (ref 4.0–10.5)

## 2011-04-08 LAB — BASIC METABOLIC PANEL
BUN: 13 mg/dL (ref 6–23)
CO2: 28 mEq/L (ref 19–32)
Calcium: 8.8 mg/dL (ref 8.4–10.5)
Chloride: 103 mEq/L (ref 96–112)
Creatinine, Ser: 0.67 mg/dL (ref 0.50–1.35)
GFR calc Af Amer: >90 mL/min (ref >90)
GFR calc non Af Amer: >90 mL/min (ref >90)
Glucose, Bld: 99 mg/dL (ref 70–99)
Potassium: 3.9 mEq/L (ref 3.5–5.1)
Sodium: 139 mEq/L (ref 135–145)

## 2011-04-08 MED ORDER — WHITE PETROLATUM GEL
Status: AC
  Administered 2011-04-08: 11:00:00
  Filled 2011-04-08: qty 5

## 2011-04-08 MED ORDER — SENNA 8.6 MG PO TABS
1.0000 | ORAL_TABLET | Freq: Two times a day (BID) | ORAL | Status: DC
Start: 2011-04-08 — End: 2011-04-13
  Administered 2011-04-08 – 2011-04-13 (×10): 8.6 mg via ORAL
  Filled 2011-04-08 (×11): qty 1

## 2011-04-08 MED ORDER — LISINOPRIL 20 MG PO TABS
20.0000 mg | ORAL_TABLET | Freq: Every day | ORAL | Status: DC
  Administered 2011-04-08 – 2011-04-13 (×6): 20 mg via ORAL
  Filled 2011-04-08 (×6): qty 1

## 2011-04-08 MED ORDER — FLEET ENEMA 7-19 GM/118ML RE ENEM: 1.0000 | ENEMA | Freq: Every day | RECTAL | Status: DC | PRN

## 2011-04-08 NOTE — Progress Notes (Addendum)
[facility] CONSULT F/U NOTE [place] TEAM 8  Subjective: 66 year-old male with history of hypertension, hyperlipidemia, tob abuse and AAA status post stent Sept 2011 who had a spine fusion surgery and was extubated at 5:30 PM Tuesday.  Shortly thereafter (approx 6:30PM) the patient complained of chest pain. Patient did not have any associated shortness of breath nausea vomiting or diaphoresis. The pain only lasted a few minutes.   During the night this patient had significant problems related to symptomatic hypotension and oversedation. The oversedation responded to Narcan with subsequent increase in severity of pain. Patient received 1.5 L of IV fluids to treat the hypertension.   Patient still complains of pain under the ribs , especially on movement. Still has constipation. Objective: Weight change:   Intake/Output Summary (Last 24 hours) at 04/08/11 1557 Last data filed at 04/08/11 0900  Gross per 24 hour  Intake    350 ml  Output    550 ml  Net   -200 ml   Blood pressure 162/78, pulse 85, temperature 99.3 F (37.4 C), temperature source Oral, resp. rate 18, height 5\' 10"  (1.778 m), weight 130.2 kg (287 lb 0.6 oz), SpO2 92.00%.  Physical Exam: General: No acute respiratory distress Lungs: Clear to auscultation bilaterally without wheezes or crackles Cardiovascular: S1S2 Regular Abdomen: Nontender, mild distension, no mass Extremities: No significant cyanosis, clubbing, or edema bilateral lower extremities  Lab Results:  Basename 04/08/11 1050 04/07/11 1541 04/06/11 0825  NA 139 137 133*  K 3.9 3.7 4.0  CL 103 102 102  CO2 28 25 23   GLUCOSE 99 115* 138*  BUN 13 10 12   CREATININE 0.67 0.57 0.78  CALCIUM 8.8 9.0 7.9*  MG -- -- --  PHOS -- -- --    Basename 04/08/11 1050 04/08/11 0500 04/07/11 1541  WBC 11.0* 9.7 12.9*  NEUTROABS -- -- 10.4*  HGB 8.9* 9.0* 10.0*  HCT 27.0* 26.9* 30.2*  MCV 84.9 85.1 84.1  PLT 170 155 151   No results found for this  basename: CKTOTAL:3,CKMB:3,CKMBINDEX:3,TROPONINI:3 in the last 72 hours Micro Results: Recent Results (from the past 240 hour(s))  SURGICAL PCR SCREEN     Status: Normal   Collection Time   03/31/11  4:28 PM      Component Value Range Status Comment   MRSA, PCR NEGATIVE  NEGATIVE  Final    Staphylococcus aureus NEGATIVE  NEGATIVE  Final   URINE CULTURE     Status: Normal (Preliminary result)   Collection Time   04/06/11  5:20 PM      Component Value Range Status Comment   Specimen Description URINE, CLEAN CATCH   Final    Special Requests NONE   Final    Setup Time [id number]   Final    Colony Count PENDING   Incomplete    Culture Culture reincubated for better growth   Final    Report Status PENDING   Incomplete      Medications: I have reviewed the patient's complete medication list.  Recommendations:  Symptomatic hypotension Resolved. Will change the IV fluids to [facility].  S/p repair of multiple pseduoarthrosis  As per primary service (Ortho)  Acute on chronic pain Continue Oxycontin 80 mg Po Q 12 hr Dialudid 1 mg IV q 4 hr prn. Norco 10/325 po q 4 hr prn  Acute blood loss anemia, postoperative ? Dilutional. Will also check stool for occult blood.  Thrombocytopenia Platelet count 138,000 Check CBC in am  Leukocytosis UA is normal  Urine culture is pending.  Constipation: Start Colace 100 mg po BID Will increase  senna tab to twice  daily.  Chest pain Lexiscan study done on 11/01/09 showed normal myocardial perfusion  More ? Muscular, on movement. Continue pain meds.  AAA s/p stent Was seen by Vasc Surgery in f/u 03/30/2011 with a clean report  HTN Will restart Accupril.at low dose of 20 mg po daily  Hyperlipidemia Resume usual tx plan  Bipolar/depression Continue Venlafaxine. Seroquel.   Meredeth Ide [phone]

## 2011-04-08 NOTE — Progress Notes (Signed)
Pain Management Consult per Rx  66 y/o male patient on long and short-acting opioids for h/o chronic back pain.  Pain score has been 4 to 7 over 10 yesterday (a little better from previously).  Abd pain is worse than back pain (secondary constipation). Patient is currently on Oxycontin 80mg  po q12h (increased yesterday), norco 10/325 po q4hprn breakthrough pain and dilaudid 1mg  iv q4hprn for severe breakthrough pain.  Yesterday, patient required no dilaudid prn and about 3 tabs of norco (which is better).  Respiratory rate is 18-20  1) Continue oxycontin to 80mg  po q12h (hold for oversedation or respiratory distress. If pain is worsen, will readdress therapy tomorrow. 2) Monitor pain scores. 3) Continue norco 10/325mg  po q4hprn and dilaudid 1mg  iv q4hprn for severe breakthrough pain.

## 2011-04-08 NOTE — Progress Notes (Signed)
Vital signs stable, afebrile,  WBC - 9.7K, Hgb. - 9.0, Platelets - 150K  Seen in Rm.# 5002  C/O abdom pain, feels he needs to move his bowels more.  Says his abdom pain is worse than back pain. Says his pain has precluded his being able to ambulate  O/E: Abdomen soft, nontender, no guarding or rebound, conversant, alert, oriented  Assessment:  Constipation secondary to +++narcotic intake  Plan: Removal of drains discussed with RN who will go ahead and remove.  Fleet enema as requested by patient.

## 2011-04-09 DIAGNOSIS — K5900 Constipation, unspecified: Secondary | ICD-10-CM | POA: Diagnosis not present

## 2011-04-09 DIAGNOSIS — I10 Essential (primary) hypertension: Secondary | ICD-10-CM | POA: Diagnosis not present

## 2011-04-09 DIAGNOSIS — R079 Chest pain, unspecified: Secondary | ICD-10-CM | POA: Diagnosis not present

## 2011-04-09 LAB — CBC
HCT: 26.1 % — ABNORMAL LOW (ref 39.0–52.0)
Hemoglobin: 8.6 g/dL — ABNORMAL LOW (ref 13.0–17.0)
MCH: 28.1 pg (ref 26.0–34.0)
MCHC: 33 g/dL (ref 30.0–36.0)
MCV: 85.3 fL (ref 78.0–100.0)
Platelets: 171 10*3/uL (ref 150–400)
RBC: 3.06 MIL/uL — ABNORMAL LOW (ref 4.22–5.81)
RDW: 15.8 % — ABNORMAL HIGH (ref 11.5–15.5)
WBC: 7.5 10*3/uL (ref 4.0–10.5)

## 2011-04-09 LAB — URINE CULTURE
Colony Count: 85000
Culture  Setup Time: 201301250149

## 2011-04-09 LAB — OCCULT BLOOD X 1 CARD TO LAB, STOOL: Fecal Occult Bld: NEGATIVE

## 2011-04-09 MED ORDER — NITROFURANTOIN MONOHYD MACRO 100 MG PO CAPS
100.0000 mg | ORAL_CAPSULE | Freq: Two times a day (BID) | ORAL | Status: DC
  Administered 2011-04-09 – 2011-04-13 (×9): 100 mg via ORAL
  Filled 2011-04-09 (×10): qty 1

## 2011-04-09 MED ORDER — CEPHALEXIN 500 MG PO CAPS
500.0000 mg | ORAL_CAPSULE | Freq: Two times a day (BID) | ORAL | Status: DC
Start: 2011-04-09 — End: 2011-04-09
  Administered 2011-04-09: 500 mg via ORAL
  Filled 2011-04-09 (×2): qty 1

## 2011-04-09 NOTE — Progress Notes (Signed)
[facility] CONSULT F/U NOTE   Subjective: 66 year-old male with history of hypertension, hyperlipidemia, tob abuse and AAA status post stent Sept 2011 who had a spine fusion surgery and was extubated at 5:30 PM Tuesday.  Shortly thereafter (approx 6:30PM) the patient complained of chest pain. Patient did not have any associated shortness of breath nausea vomiting or diaphoresis. The pain only lasted a few minutes.   During the night this patient had significant problems related to symptomatic hypotension and oversedation. The oversedation responded to Narcan with subsequent increase in severity of pain. Patient received 1.5 L of IV fluids to treat the hypertension.   Patient still complains of pain under the ribs , especially on movement. Had a large bowel movement yesterday, still complains of bloating abdomen because of gas. Objective: Weight change:   Intake/Output Summary (Last 24 hours) at 04/09/11 1047 Last data filed at 04/09/11 0634  Gross per 24 hour  Intake 1111.67 ml  Output    725 ml  Net 386.67 ml   Blood pressure 134/77, pulse 73, temperature 99 F (37.2 C), temperature source Oral, resp. rate 16, height 5\' 10"  (1.778 m), weight 130.2 kg (287 lb 0.6 oz), SpO2 94.00%.  Physical Exam: General: No acute respiratory distress Lungs: Clear to auscultation bilaterally without wheezes or crackles Cardiovascular: S1S2 Regular Abdomen: Nontender, mild distension, no mass Extremities: No significant cyanosis, clubbing, or edema bilateral lower extremities  Lab Results:  Basename 04/08/11 1050 04/07/11 1541  NA 139 137  K 3.9 3.7  CL 103 102  CO2 28 25  GLUCOSE 99 115*  BUN 13 10  CREATININE 0.67 0.57  CALCIUM 8.8 9.0  MG -- --  PHOS -- --    Basename 04/09/11 0500 04/08/11 1050 04/08/11 0500 04/07/11 1541  WBC 7.5 11.0* 9.7 --  NEUTROABS -- -- -- 10.4*  HGB 8.6* 8.9* 9.0* --  HCT 26.1* 27.0* 26.9* --  MCV 85.3 84.9 85.1 --  PLT 171 170 155 --   No  results found for this basename: CKTOTAL:3,CKMB:3,CKMBINDEX:3,TROPONINI:3 in the last 72 hours Micro Results: Recent Results (from the past 240 hour(s))  SURGICAL PCR SCREEN     Status: Normal   Collection Time   03/31/11  4:28 PM      Component Value Range Status Comment   MRSA, PCR NEGATIVE  NEGATIVE  Final    Staphylococcus aureus NEGATIVE  NEGATIVE  Final   URINE CULTURE     Status: Normal (Preliminary result)   Collection Time   04/06/11  5:20 PM      Component Value Range Status Comment   Specimen Description URINE, CLEAN CATCH   Final    Special Requests NONE   Final    Setup Time [id number]   Final    Colony Count 85,000 COLONIES/ML   Final    Culture     Final    Value: STAPHYLOCOCCUS SPECIES (COAGULASE NEGATIVE)     Note: RIFAMPIN AND GENTAMICIN SHOULD NOT BE USED AS SINGLE DRUGS FOR TREATMENT OF STAPH INFECTIONS.   Report Status PENDING   Incomplete      Medications: I have reviewed the patient's complete medication list.  Recommendations:  Symptomatic hypotension Resolved. Will change the IV fluids to [facility].  S/p repair of multiple pseduoarthrosis  As per primary service (Ortho)  Acute on chronic pain Continue Oxycontin 80 mg Po Q 12 hr Dialudid 1 mg IV q 4 hr prn. Norco 10/325 po q 4 hr prn  Acute blood loss anemia,  postoperative ? Dilutional.  stool for occult blood is pending Follow CBC the morning   Leukocytosis Resolved UA is normal Urine culture is growing coagulase negative staph. Final sensitivities pending at this time We'll start by mouth Keflex for on her by mouth twice a day  Constipation: Start Colace 100 mg po BID  senna tab to twice  daily.  Chest pain Lexiscan study done on 11/01/09 showed normal myocardial perfusion  More ? Muscular, on movement. Continue pain meds.  AAA s/p stent Was seen by Vasc Surgery in f/u 03/30/2011 with a clean report  HTN Will restart Accupril.at low dose of 20 mg po daily  Hyperlipidemia Resume  usual tx plan  Bipolar/depression Continue Venlafaxine. Seroquel.   Meredeth Ide [phone]

## 2011-04-09 NOTE — Progress Notes (Signed)
Vital signs stable, afebrile,  WBC - normal, Hgb - 8.6  C/O abdominal and back pain and "gas"  He had a BM yesterday.  Has not been up to walk today.  O/E:  Incisions look good - no drainage or erythema  Assessment: Slow to mobilize secondary to pain, acute surgical blood loss anemia  Plan:  Dressing changed

## 2011-04-09 NOTE — Progress Notes (Addendum)
Pain Management Consult per Rx  66 y/o male patient on long and short-acting opioids for h/o chronic back pain.  Pain score has been 3 to 9 over 10 yesterday. Increased in pain at one time yesterday most likely due to the administration of narcan the night before yesterday.  Patient is currently on Oxycontin 80mg  po q12h (increased 2 days ago), norco 10/325 po q4hprn breakthrough pain and dilaudid 1mg  iv q4hprn for severe breakthrough pain.  Also on robaxin.  Yesterday, patient required 3 dilaudid prn and about 6 tabs of norco.  Respiratory rate is 16-18  1)Will continue oxycontin to 80mg  po q12h for now (hold for oversedation or respiratory distress). If pain is worsen, will readdress therapy tomorrow. 2) Monitor pain scores. 3) Continue norco 10/325mg  po q4hprn and dilaudid 1mg  iv q4hprn for severe breakthrough pain. Marland Kitchen

## 2011-04-10 DIAGNOSIS — R079 Chest pain, unspecified: Secondary | ICD-10-CM | POA: Diagnosis not present

## 2011-04-10 DIAGNOSIS — I10 Essential (primary) hypertension: Secondary | ICD-10-CM | POA: Diagnosis not present

## 2011-04-10 DIAGNOSIS — B029 Zoster without complications: Secondary | ICD-10-CM

## 2011-04-10 DIAGNOSIS — K5900 Constipation, unspecified: Secondary | ICD-10-CM | POA: Diagnosis not present

## 2011-04-10 DIAGNOSIS — L408 Other psoriasis: Secondary | ICD-10-CM | POA: Diagnosis not present

## 2011-04-10 DIAGNOSIS — L309 Dermatitis, unspecified: Secondary | ICD-10-CM

## 2011-04-10 MED ORDER — ACYCLOVIR 5 % EX OINT
TOPICAL_OINTMENT | Freq: Four times a day (QID) | CUTANEOUS | Status: DC
  Administered 2011-04-10 – 2011-04-13 (×12): via TOPICAL
  Filled 2011-04-10 (×2): qty 30

## 2011-04-10 MED ORDER — ACYCLOVIR 5 % EX CREA
TOPICAL_CREAM | Freq: Four times a day (QID) | CUTANEOUS | Status: DC
  Filled 2011-04-10: qty 5

## 2011-04-10 MED ORDER — VALACYCLOVIR HCL 500 MG PO TABS
1000.0000 mg | ORAL_TABLET | Freq: Three times a day (TID) | ORAL | Status: DC
  Filled 2011-04-10 (×3): qty 2

## 2011-04-10 MED ORDER — SIMETHICONE 80 MG PO CHEW
80.0000 mg | CHEWABLE_TABLET | Freq: Four times a day (QID) | ORAL | Status: DC
  Administered 2011-04-10 – 2011-04-13 (×14): 80 mg via ORAL
  Filled 2011-04-10 (×17): qty 1

## 2011-04-10 NOTE — Progress Notes (Signed)
Occupational Therapy Treatment Patient Details Name: Michael Lutz MRN: [id number] DOB: February 14, 1946 Today's Date: 04/10/2011  OT Assessment/Plan OT Assessment/Plan Comments on Treatment Session: Pt continues to progress, but slowly due to pain level. OT Plan: Discharge plan remains appropriate OT Frequency: Min 2X/week Follow Up Recommendations: Home health OT;Supervision/Assistance - 24 hour Equipment Recommended: None recommended by OT OT Goals Acute Rehab OT Goals OT Goal Formulation: With patient Time For Goal Achievement: 7 days ADL Goals Pt Will Perform Grooming: with supervision;Standing at sink;Other (comment) ADL Goal: Grooming - Progress: Met Pt Will Perform Upper Body Bathing: with adaptive equipment;Sitting, edge of bed;with set-up ADL Goal: Upper Body Bathing - Progress: Progressing toward goals Pt Will Perform Lower Body Bathing: with min assist;with adaptive equipment;Sitting, edge of bed;Sit to stand from bed ADL Goal: Lower Body Bathing - Progress: Progressing toward goals Pt Will Perform Upper Body Dressing: with set-up;Sitting, bed ADL Goal: Upper Body Dressing - Progress: Met Pt Will Perform Lower Body Dressing: with min assist;with adaptive equipment;Sit to stand from bed;Sitting, bed ADL Goal: Lower Body Dressing - Progress: Progressing toward goals Pt Will Transfer to Toilet: with supervision;3-in-1;Other (comment);Maintaining back safety precautions;Ambulation ADL Goal: Toilet Transfer - Progress: Progressing toward goals Pt Will Perform Toileting - Clothing Manipulation: Standing;with supervision ADL Goal: Toileting - Clothing Manipulation - Progress: Progressing toward goals Pt Will Perform Toileting - Hygiene: with adaptive equipment;with modified independence;Other (comment) ADL Goal: Toileting - Hygiene - Progress: Progressing toward goals Miscellaneous OT Goals Miscellaneous OT Goal #1: Pt will generalize back precautions in ADL. OT Goal: Miscellaneous  Goal #1 - Progress: Met  OT Treatment Precautions/Restrictions  Precautions Precautions: Back;Fall Precaution Booklet Issued: No Required Braces or Orthoses: No Restrictions Weight Bearing Restrictions: No   ADL ADL Grooming: Performed;Wash/dry face;Set up;Minimal assistance (min guard) Grooming Details (indicate cue type and reason): Mod verbal cues for use of RW, and min verbal cues for no bending or twisting Where Assessed - Grooming: Standing at sink Upper Body Dressing: Performed;Set up Upper Body Dressing Details (indicate cue type and reason): with donning gown Where Assessed - Upper Body Dressing: Sitting, bed Toilet Transfer: Performed;Minimal assistance Toilet Transfer Details (indicate cue type and reason): Min verbal cues for hand placement on arm rests Toilet Transfer Method: Other (comment) (recliner) Toileting - Clothing Manipulation: Simulated;Minimal assistance Toileting - Clothing Manipulation Details (indicate cue type and reason): with moving gown Where Assessed - Toileting Clothing Manipulation: Standing Ambulation Related to ADLs: Pt min assist with ambulation due to decreased balance  ADL Comments: Pt. only able to recall 2/3 back precautions and re-educated on 3 back precautions with ADLs and techniques for performing ADLs safely.  Mobility  Bed Mobility Bed Mobility: Yes Rolling Left: 5: Supervision;With rail Rolling Left Details (indicate cue type and reason): Min verbal cues for hand placement and no twisting Left Sidelying to Sit: 5: Supervision;With rails;HOB flat Left Sidelying to Sit Details (indicate cue type and reason): Verbal cues for sequence to protect back. Sit to Supine: Not Tested (comment) Transfers Transfers: Yes Sit to Stand: 4: Min assist;With upper extremity assist;From bed;From elevated surface Sit to Stand Details (indicate cue type and reason): Min verbal cues for hand placement Stand to Sit: 4: Min assist;With upper extremity  assist;To chair/3-in-1 (Min (guard)) Stand to Sit Details: Guarding for balance with cues for safest hand placement.     End of Session OT - End of Session Equipment Utilized During Treatment: Gait belt Activity Tolerance: Patient limited by pain Patient left: in chair;with family/visitor present Nurse  Communication: Mobility status for transfers General Behavior During Session: [facility] for tasks performed Cognition: [facility] for tasks performed  Co-Treat with Flora Lipps, OTR/L Pager [phone]  04/10/2011, 1:49 PM

## 2011-04-10 NOTE — Consult Note (Signed)
Infectious Diseases Initial Consultation                Day 1 valacyyclovir  Date of Admission:  04/04/2011  Date of Consult:  04/10/2011  Reason for Consult: Possible shingles Referring Physician: Dr. Thad Ranger  Problem List:  Active Problems:  Dermatitis   Recommendations: 1. Discontinue valacyclovir and isolation precautions.   Assessment: I do not think that his acute dermatitis is due to shingles given the fact that it is in multiple locations (chin and chest) and crosses the midline. The fact that he has a history of shingles on his right arm, again a completely separate location, also speaks against this being due to shingles. I suspect that his right leg to see prone on the surgical table for an extensive period of time during his recent operation. I would recommend stopping valacyclovir and isolation and simply keep the area clean and dry.    HPI: Michael Lutz is a 66 y.o. male was admitted on January 22 to undergo extensive thoracolumbar surgery. Postoperatively he was noted to have a rash on his anterior chest just over the sternum and I was asked to see him today because of the possibility that this might represent shingles. He has a history of right forearm shingles about one year ago. He states that the rash on his chest and to put his chin was first noted about 24 hours postoperatively. He states that it's a little bit uncomfortable but not terribly painful.   Review of Systems: Pertinent items are noted in HPI.     . sodium chloride   Intravenous Once  . acyclovir ointment   Topical QID  . docusate sodium  100 mg Oral BID  . ezetimibe  10 mg Oral Daily  . gabapentin  300 mg Oral QID  . lamoTRIgine  200 mg Oral QHS  . lisinopril  20 mg Oral Daily  . methocarbamol  500 mg Oral Q4H  . nitrofurantoin (macrocrystal-monohydrate)  100 mg Oral BID  . oxyCODONE  80 mg Oral Q12H  . pantoprazole  40 mg Oral BID AC  . polyethylene glycol  17 g Oral BID  . QUEtiapine   300 mg Oral QHS  . senna  1 tablet Oral BID  . simethicone  80 mg Oral QID  . traZODone  400 mg Oral QHS  . valACYclovir  1,000 mg Oral TID  . venlafaxine  150 mg Oral Daily  . DISCONTD: acyclovir cream   Topical QID  . DISCONTD: cephALEXin  500 mg Oral Q12H    Past Medical History  Diagnosis Date  . Heart murmur     arrythmia  . Anxiety   . AAA (abdominal aortic aneurysm)   . Hyperlipidemia   . Hypertension   . Joint pain   . Leg pain 05-04-10    with walking  . History of shingles   . Peripheral vascular disease     s/p AAA stent  . Dysrhythmia     palpitations- sees dr Eldridge Dace  . Depression   . ADD (attention deficit disorder)   . Bipolar affective disorder     in remission on meds  . Chronic back pain     [facility]  . Colon polyps     Dr Matthias Hughs does a colonoscopy every 5 years  . Arthritis     degenerative spine    History  Substance Use Topics  . Smoking status: Current Everyday Smoker -- 0.5 packs/day for 50  years    Types: Cigars  . Smokeless tobacco: Not on file   Comment: states smokes "little cigars"  . Alcohol Use: No    Family History  Problem Relation Age of Onset  . Cancer Mother     melanoma  . Diabetes Father   . Arthritis Sister     rheumatoid   No Known Allergies  OBJECTIVE: Blood pressure 123/68, pulse 84, temperature 98.2 F (36.8 C), temperature source Oral, resp. rate 20, height 5\' 10"  (1.778 m), weight 130.2 kg (287 lb 0.6 oz), SpO2 93.00%. General: He is alert and conversant Skin: He has a superficial scab on the tip of his chin. He has a large area of redness on his sternum which crosses the midline and is fairly symmetrical. There are some small pustules and a few very small ulcerated areas. It is not warm or painful to touch. There is no drainage. He has a scar on his right forearm where he states his previous bout of shingles occurred. Lungs: Clear Cor: Distant but regular S1-S2 no murmurs  Abdomen:  Soft and obese They're clean dry dressings over his surgical incisions  Microbiology: Recent Results (from the past 240 hour(s))  SURGICAL PCR SCREEN     Status: Normal   Collection Time   03/31/11  4:28 PM      Component Value Range Status Comment   MRSA, PCR NEGATIVE  NEGATIVE  Final    Staphylococcus aureus NEGATIVE  NEGATIVE  Final   URINE CULTURE     Status: Normal   Collection Time   04/06/11  5:20 PM      Component Value Range Status Comment   Specimen Description URINE, CLEAN CATCH   Final    Special Requests NONE   Final    Culture  Setup Time [id number]   Final    Colony Count 85,000 COLONIES/ML   Final    Culture     Final    Value: STAPHYLOCOCCUS SPECIES (COAGULASE NEGATIVE)     Note: RIFAMPIN AND GENTAMICIN SHOULD NOT BE USED AS SINGLE DRUGS FOR TREATMENT OF STAPH INFECTIONS.   Report Status 04/09/2011 FINAL   Final    Organism ID, Bacteria STAPHYLOCOCCUS SPECIES (COAGULASE NEGATIVE)   Final     Cliffton Asters, MD [facility] [phone] pager   [phone] cell 04/10/2011, 11:16 AM

## 2011-04-10 NOTE — Progress Notes (Signed)
Vital signs stable, afebrile  Seen in the hall and in his room, upright ambulating with a walker and physical therapy supervision.  Firstly, I read the recent entries which culminated in Dr. Blair Dolphin consultation.  There is no mystery to this man's presternal rash.  He was positioned prone with a chest rest (gel padded) supporting his chest, for a beriod >8 hours.  When he was taken off the OR table, the presternal skin was erythematous and by the next SM he had developed blisters.   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  O/E:  Standing erect with walker, no limp when he walks  Ass't: Stable, progressing slowly  Plan: Order a new TLSO.  His old TLSO is worn out (over a year old) and ill-fitting.  He is OK to ambulate without at this time.  Aim for discharge on 30 Jan., 2013.

## 2011-04-10 NOTE — Progress Notes (Signed)
Physical Therapy Treatment Patient Details Name: Michael Lutz MRN: [id number] DOB: 1946/01/02 Today's Date: 04/10/2011  PT Assessment/Plan  PT - Assessment/Plan Comments on Treatment Session: Pt admitted s/p lumbar fusion and continues to be limited by pain.  Motivated to progress, however.  Co-treatment with OT. PT Plan: Discharge plan remains appropriate;Frequency remains appropriate PT Frequency: Min 5X/week Follow Up Recommendations: Home health PT;Supervision/Assistance - 24 hour Equipment Recommended: None recommended by PT PT Goals  Acute Rehab PT Goals PT Goal Formulation: With patient Time For Goal Achievement: 7 days PT Goal: Rolling Supine to Right Side - Progress: Progressing toward goal PT Goal: Supine/Side to Sit - Progress: Progressing toward goal PT Goal: Sit to Stand - Progress: Progressing toward goal PT Goal: Stand to Sit - Progress: Progressing toward goal PT Goal: Ambulate - Progress: Progressing toward goal  PT Treatment Precautions/Restrictions  Precautions Precautions: Back;Fall Precaution Booklet Issued: No Required Braces or Orthoses: No Restrictions Weight Bearing Restrictions: No Pain 8/10 in back.  Pt repositioned. Mobility (including Balance) Bed Mobility Bed Mobility: Yes Rolling Left: 5: Supervision;With rail Rolling Left Details (indicate cue type and reason): Verbal cues for sequence. Left Sidelying to Sit: 5: Supervision;With rails;HOB flat Left Sidelying to Sit Details (indicate cue type and reason): Verbal cues for sequence to protect back. Sit to Supine: Not Tested (comment) Transfers Transfers: Yes Sit to Stand: 4: Min assist;With upper extremity assist;From bed (Min (guard)) Sit to Stand Details (indicate cue type and reason): Guarding for balance with cues for hand placement/safety. Stand to Sit: 4: Min assist;With upper extremity assist;To chair/3-in-1 (Min (guard)) Stand to Sit Details: Guarding for balance with cues for safest  hand placement. Ambulation/Gait Ambulation/Gait: Yes Ambulation/Gait Assistance: 4: Min assist (Min (guard)) Ambulation/Gait Assistance Details (indicate cue type and reason): Guarding for balance with cues for tall posture and safety inside RW especially with turns. Ambulation Distance (Feet): 210 Feet Assistive device: Rolling walker Gait Pattern: Decreased step length - right;Decreased step length - left;Antalgic;Trunk flexed Gait velocity: Decreased gait speed Stairs: No Wheelchair Mobility Wheelchair Mobility: No  Posture/Postural Control Posture/Postural Control: No significant limitations Balance Balance Assessed: Yes Static Standing Balance Static Standing - Balance Support: Bilateral upper extremity supported Static Standing - Level of Assistance: 5: Stand by assistance Static Standing - Comment/# of Minutes: Cues for tall posture with cues for safety. End of Session PT - End of Session Equipment Utilized During Treatment: Gait belt Activity Tolerance: Patient limited by pain Patient left: in chair;with call bell in reach Nurse Communication: Mobility status for ambulation;Mobility status for transfers General Behavior During Session: [facility] for tasks performed Cognition: [facility] for tasks performed  Cephus Shelling 04/10/2011, 1:48 PM  04/10/2011 Cephus Shelling, PT, DPT [phone]

## 2011-04-10 NOTE — Progress Notes (Signed)
[facility] CONSULT F/U NOTE   Subjective:  Patient still complains of pain under the ribs , especially on movement, has a rash on the front chest/abdominal area crusting. Complaining of gas pains, somewhat improving. Last BM yesterday Objective: Weight change:   Intake/Output Summary (Last 24 hours) at 04/10/11 0832 Last data filed at 04/10/11 0300  Gross per 24 hour  Intake    480 ml  Output   2100 ml  Net  -1620 ml   Blood pressure 123/68, pulse 84, temperature 98.2 F (36.8 C), temperature source Oral, resp. rate 20, height 5\' 10"  (1.778 m), weight 130.2 kg (287 lb 0.6 oz), SpO2 93.00%.  Physical Exam: General: Alert and oriented x3, No acute respiratory distress Lungs: Clear to auscultation bilaterally without wheezes or crackles CVS: S1S2 Regular Abdomen: Rash on upper abdominal/chest area, somewhat crusting, appears like shingles, Nontender, mild distension, no mass Extremities: No significant cyanosis, clubbing, or edema bilateral lower extremities  Lab Results:  Basename 04/08/11 1050 04/07/11 1541  NA 139 137  K 3.9 3.7  CL 103 102  CO2 28 25  GLUCOSE 99 115*  BUN 13 10  CREATININE 0.67 0.57  CALCIUM 8.8 9.0  MG -- --  PHOS -- --    Basename 04/09/11 0500 04/08/11 1050 04/08/11 0500 04/07/11 1541  WBC 7.5 11.0* 9.7 --  NEUTROABS -- -- -- 10.4*  HGB 8.6* 8.9* 9.0* --  HCT 26.1* 27.0* 26.9* --  MCV 85.3 84.9 85.1 --  PLT 171 170 155 --   No results found for this basename: CKTOTAL:3,CKMB:3,CKMBINDEX:3,TROPONINI:3 in the last 72 hours Micro Results: Recent Results (from the past 240 hour(s))  SURGICAL PCR SCREEN     Status: Normal   Collection Time   03/31/11  4:28 PM      Component Value Range Status Comment   MRSA, PCR NEGATIVE  NEGATIVE  Final    Staphylococcus aureus NEGATIVE  NEGATIVE  Final   URINE CULTURE     Status: Normal   Collection Time   04/06/11  5:20 PM      Component Value Range Status Comment   Specimen Description URINE,  CLEAN CATCH   Final    Special Requests NONE   Final    Setup Time [id number]   Final    Colony Count 85,000 COLONIES/ML   Final    Culture     Final    Value: STAPHYLOCOCCUS SPECIES (COAGULASE NEGATIVE)     Note: RIFAMPIN AND GENTAMICIN SHOULD NOT BE USED AS SINGLE DRUGS FOR TREATMENT OF STAPH INFECTIONS.   Report Status 04/09/2011 FINAL   Final    Organism ID, Bacteria STAPHYLOCOCCUS SPECIES (COAGULASE NEGATIVE)   Final      Medications: I have reviewed the patient's complete medication list.  Recommendations:  Rash: Per the patient, the rash had appeared on the day of the surgery, had blisters which popped and now crusting. He previously has a history of shingles in the past.  - Place on airborne/contact isolation, start Valtrex. Discussed with Dr. Orvan Falconer (ID) to evaluate the rash.     Symptomatic hypotension: resolved.  S/p repair of multiple pseduoarthrosis  As per primary service (Ortho)  Acute on chronic pain Continue Oxycontin 80 mg Po Q 12 hr Dialudid 1 mg IV q 4 hr prn. Norco 10/325 po q 4 hr prn  Acute blood loss anemia, postoperative ? Dilutional, stool for occult blood  negative  Leukocytosis: Resolved Urine culture is growing coagulase negative staph. - On Macrobid per  the sensitivities  Constipation:  continue Colace, senna 100 mg po BID  Chest pain: Resolved Lexiscan study done on 11/01/09 showed normal myocardial perfusion, ? Related to shingles/musculoskeletal    AAA s/p stent Was seen by Vasc Surgery in f/u 03/30/2011 with a clean report  HTN  continue Accupril.at low dose of 20 mg po daily  Hyperlipidemia Resume usual tx plan  Bipolar/depression Continue Venlafaxine. Seroquel.   Michael Lutz M.D. [facility] 04/10/2011, 8:40 AM

## 2011-04-11 DIAGNOSIS — I10 Essential (primary) hypertension: Secondary | ICD-10-CM | POA: Diagnosis not present

## 2011-04-11 DIAGNOSIS — L408 Other psoriasis: Secondary | ICD-10-CM | POA: Diagnosis not present

## 2011-04-11 DIAGNOSIS — R079 Chest pain, unspecified: Secondary | ICD-10-CM | POA: Diagnosis not present

## 2011-04-11 DIAGNOSIS — K5900 Constipation, unspecified: Secondary | ICD-10-CM | POA: Diagnosis not present

## 2011-04-11 MED ORDER — MORPHINE SULFATE 4 MG/ML IJ SOLN
6.0000 mg | INTRAMUSCULAR | Status: DC | PRN
  Administered 2011-04-11 (×4): 6 mg via INTRAVENOUS
  Administered 2011-04-12: 4 mg via INTRAVENOUS
  Administered 2011-04-12 – 2011-04-13 (×4): 6 mg via INTRAVENOUS
  Filled 2011-04-11 (×2): qty 1
  Filled 2011-04-11: qty 2
  Filled 2011-04-11: qty 1
  Filled 2011-04-11 (×6): qty 2

## 2011-04-11 MED ORDER — HYDROCHLOROTHIAZIDE 25 MG PO TABS
25.0000 mg | ORAL_TABLET | Freq: Every day | ORAL | Status: DC
  Administered 2011-04-11 – 2011-04-13 (×3): 25 mg via ORAL
  Filled 2011-04-11 (×3): qty 1

## 2011-04-11 MED ORDER — MORPHINE BOLUS VIA INFUSION: 6.0000 mg | INTRAVENOUS | Status: DC | PRN

## 2011-04-11 NOTE — Progress Notes (Signed)
  Patient Details:  Michael Lutz 1945/09/05 [id number]  VS'Michael stable, afebrile  C/O pain and wants a higher dose of dilaudid, refused PT this AM because of pain  O/E: dressing dry, lying on right side in bed  Ass't: Pain control inadequate  Plan:  We will try substituting Morphine for Dilaudid,  Asc case manager to asses for placement, as he seems not ready for imminent discharge.  Michael Lutz,Michael Lutz 04/11/2011, 10:54 AM

## 2011-04-11 NOTE — Clinical Documentation Improvement (Signed)
Abnormal Labs Clarification  THIS DOCUMENT IS NOT A PERMANENT PART OF THE MEDICAL RECORD  TO RESPOND TO THE THIS QUERY, FOLLOW THE INSTRUCTIONS BELOW:  1. If needed, update documentation for the patient's encounter via the notes activity.  2. Access this query again and click edit on the [employer].  3. After updating, or not, click F2 to complete all highlighted (required) fields concerning your review. Select "additional documentation in the medical record" OR "no additional documentation provided".  4. Click Sign note button.  5. The deficiency will fall out of your InBasket *Please let us know if you are not able to complete this workflow by phone or e-mail (listed below).  Please update your documentation within the medical record to reflect your response to this query.                                                                                   04/11/11  Dear Dr. Elvera Lennox. [facility]  In a better effort to capture your patient's severity of illness, reflect appropriate length of stay and utilization of resources, a review of the medical record has revealed the following indicators.    Based on your clinical judgment, please clarify and document in a progress note and/or discharge summary the clinical condition associated with the following supporting information:  In responding to this query please exercise your independent judgment.  The fact that a query is asked, does not imply that any particular answer is desired or expected.  Abnormal findings (laboratory, x-ray, pathologic, and other diagnostic results) are not coded and reported unless the physician indicates their clinical significance.   The medical record reflects the following clinical findings, please clarify the diagnostic and/or clinical significance:      Supporting Information:  Per notes documenting that urine culture grew out "coagulase negative staph" And patient treated with 1st Keflex and then  Macrobid.  Please clarify secondary diagnosis if appropriate.  Thank you     Possible Clinical Conditions?                                   Bacturia __________  UTI_______________                                      Other Condition___________________                  Cannot Clinically Determine_________   Treatment noted above po antibiotics   Reviewed: UTI   Thank You,  Lavonda Jumbo  Clinical Documentation Specialist RN, BSN, CDS:  Pager [phone]  [facility] Information [facility] [place]

## 2011-04-11 NOTE — Progress Notes (Signed)
Pain Management Consult per Rx  66 y/o male patient on long and short-acting opioids for h/o chronic back pain. Pain score has been three - eight. Past 48 hours.   Patient is currently receiving  oxycontin 80mg  q12h Norco 10/325 q4h prn breakthrough pain Dilaudid 1mg  q4h prn severe breakthrough pain Robaxin 500mg  and gabapentin scheduled  Patient appears to be receiving dilaudid and norco every 4 hours around the clock. In discussing his pain he did mention that he is followed by Dr. Vear Clock at the pain clinic as outpatient. I have also corrected the home med history to reflect that he takes 30mg  of oxycodone q4h and not oxycontin, although [name] does say that at one point he was taking oxycontin 80mg  q8h in the past.   Recommendations/considerations: 1.Consider oxycontin 60mg  q8 hours or 90mg  q12h although patient feels the oxycontin isn't lasting the full 12h. 2.Consider changing norco to oxycodone 15-30 q4 hours prn breakthrough pain to reduce acetaminophen consumption 3.Continue dilaudid 1mg  q 4h prn severe breakthrough pain 4.Monitor pain score and respirations.   04/11/2011 Michael Lutz PharmD.

## 2011-04-11 NOTE — Progress Notes (Signed)
Occupational Therapy Treatment Patient Details Name: Michael Lutz MRN: [id number] DOB: May 25, 1945 Today's Date: 04/11/2011  OT Assessment/Plan OT Assessment/Plan Comments on Treatment Session: Pt. moving well and continues to progress. OT Plan: Discharge plan remains appropriate OT Frequency: Min 2X/week Follow Up Recommendations: Home health OT;Supervision/Assistance - 24 hour Equipment Recommended: None recommended by OT OT Goals Acute Rehab OT Goals OT Goal Formulation: With patient Time For Goal Achievement: 7 days ADL Goals Pt Will Perform Grooming: with supervision;Standing at sink;Other (comment) ADL Goal: Grooming - Progress: Met Pt Will Perform Upper Body Bathing: with adaptive equipment;Sitting, edge of bed;with set-up ADL Goal: Upper Body Bathing - Progress: Partly met Pt Will Perform Lower Body Bathing: with min assist;with adaptive equipment;Sitting, edge of bed;Sit to stand from bed ADL Goal: Lower Body Bathing - Progress: Partly met Pt Will Perform Upper Body Dressing: with set-up;Sitting, bed ADL Goal: Upper Body Dressing - Progress: Met Pt Will Perform Lower Body Dressing: with min assist;with adaptive equipment;Sit to stand from bed;Sitting, bed ADL Goal: Lower Body Dressing - Progress: Met Pt Will Transfer to Toilet: with supervision;3-in-1;Other (comment);Maintaining back safety precautions;Ambulation ADL Goal: Toilet Transfer - Progress: Partly met Pt Will Perform Toileting - Clothing Manipulation: Standing;with supervision ADL Goal: Toileting - Clothing Manipulation - Progress: Partly met Pt Will Perform Toileting - Hygiene: with adaptive equipment;with modified independence;Other (comment) ADL Goal: Toileting - Hygiene - Progress: Partly met Miscellaneous OT Goals Miscellaneous OT Goal #1: Pt will generalize back precautions in ADL. OT Goal: Miscellaneous Goal #1 - Progress: Met  OT Treatment Precautions/Restrictions  Precautions Precautions:  Back;Fall Precaution Booklet Issued: No Required Braces or Orthoses: No Restrictions Weight Bearing Restrictions: No   ADL ADL Lower Body Dressing: Performed;Set up Lower Body Dressing Details (indicate cue type and reason): Pt. able to cross foot over opposite knee to complete Where Assessed - Lower Body Dressing: Sitting, bed Toilet Transfer: Performed;Minimal assistance Toilet Transfer Details (indicate cue type and reason): Min verbal cues for hand placement on arm rests Toilet Transfer Method: Ambulating Toilet Transfer Equipment: Regular height toilet;Grab bars Ambulation Related to ADLs: Pt. supervision with use of RW ADL Comments: Pt. able to recall 3/3 back precautions. Demonstrated and educated pt. on safe technique for shower transfer with use of shower seat and RW use and wall for support PRN. Pt. able to verbalize understanding. Mobility  Bed Mobility Bed Mobility: Yes Rolling Left: 5: Supervision;With rail Left Sidelying to Sit: 5: Supervision;With rails;HOB flat Transfers Transfers: Yes Sit to Stand: 4: Min assist;With upper extremity assist;From bed;From elevated surface Sit to Stand Details (indicate cue type and reason): Min verbal cues for hand placement Stand to Sit: 4: Min assist;With upper extremity assist;To chair/3-in-1     End of Session OT - End of Session Equipment Utilized During Treatment: Gait belt Activity Tolerance: Patient limited by pain Patient left: in chair;with family/visitor present Nurse Communication: Mobility status for transfers General Behavior During Session: [facility] for tasks performed Cognition: [facility] for tasks performed  Cassandria Anger, OTR/L Pager [phone]  04/11/2011, 12:39 PM

## 2011-04-11 NOTE — Progress Notes (Signed)
Physical Therapy Treatment Patient Details Name: Michael Lutz MRN: [id number] DOB: 11/11/1945 Today's Date: 04/11/2011  PT Assessment/Plan  PT - Assessment/Plan Comments on Treatment Session: Pt admitted s/p lumbar fusion and continues to have increased pain.  Motivated to progress, however.  Able to negotiate stairs today.  Co-treatment with OT. PT Plan: Discharge plan remains appropriate;Frequency remains appropriate PT Frequency: Min 5X/week Follow Up Recommendations: Home health PT;Supervision/Assistance - 24 hour Equipment Recommended: None recommended by PT PT Goals  Acute Rehab PT Goals PT Goal Formulation: With patient Time For Goal Achievement: 7 days PT Goal: Rolling Supine to Right Side - Progress: Progressing toward goal PT Goal: Supine/Side to Sit - Progress: Progressing toward goal PT Goal: Sit to Stand - Progress: Progressing toward goal PT Goal: Stand to Sit - Progress: Progressing toward goal PT Goal: Ambulate - Progress: Progressing toward goal PT Goal: Up/Down Stairs - Progress: Progressing toward goal  PT Treatment Precautions/Restrictions  Precautions Precautions: Back;Fall Precaution Booklet Issued: No Required Braces or Orthoses: Yes Spinal Brace: Thoracolumbosacral orthotic;Applied in sitting position (Use PRN for pain control per Dr. Alveda Reasons.) Restrictions Weight Bearing Restrictions: No Pain 8/10 in back.  Pt repositioned and premedicated. Mobility (including Balance) Bed Mobility Bed Mobility: Yes Rolling Left: 5: Supervision;With rail Rolling Left Details (indicate cue type and reason): Verbal cues for hand placement and to follow back precautions. Left Sidelying to Sit: 5: Supervision;With rails;HOB flat Left Sidelying to Sit Details (indicate cue type and reason): Verbal cues for safe sequence to follow back precautions. Sit to Supine: Not Tested (comment) Transfers Transfers: Yes Sit to Stand: With upper extremity assist;From bed;5:  Supervision;From toilet (2 trials.) Sit to Stand Details (indicate cue type and reason): Verbal cues for hand placement and safety. Stand to Sit: With upper extremity assist;To chair/3-in-1;5: Supervision;To toilet (2 trials.) Stand to Sit Details: Verbal cues for safest hand placement and sequence. Ambulation/Gait Ambulation/Gait: Yes Ambulation/Gait Assistance: 5: Supervision Ambulation/Gait Assistance Details (indicate cue type and reason): Verbal cues for tall posture and safety inside RW. Ambulation Distance (Feet): 150 Feet Assistive device: Rolling walker Gait Pattern: Decreased step length - right;Decreased step length - left;Antalgic;Trunk flexed Gait velocity: Decreased gait speed Stairs: Yes Stairs Assistance: 4: Min assist Stairs Assistance Details (indicate cue type and reason): Assist for balance with cues for safe sequence. Stair Management Technique: Two rails;Forwards Number of Stairs: 2  Height of Stairs: 8  (8 inches.) Wheelchair Mobility Wheelchair Mobility: No  Posture/Postural Control Posture/Postural Control: No significant limitations Balance Balance Assessed: No End of Session PT - End of Session Equipment Utilized During Treatment: Gait belt;Back brace Activity Tolerance: Patient tolerated treatment well Patient left: in chair;with call bell in reach Nurse Communication: Mobility status for ambulation;Mobility status for transfers General Behavior During Session: [facility] for tasks performed Cognition: [facility] for tasks performed  Cephus Shelling 04/11/2011, 1:30 PM  04/11/2011 Cephus Shelling, PT, DPT [phone]

## 2011-04-11 NOTE — Progress Notes (Signed)
[facility] CONSULT F/U NOTE   Subjective: No specific complaints today  Objective: Weight change:   Intake/Output Summary (Last 24 hours) at 04/11/11 0814 Last data filed at 04/11/11 0700  Gross per 24 hour  Intake   1960 ml  Output    950 ml  Net   1010 ml   Blood pressure 150/73, pulse 76, temperature 98.5 F (36.9 C), temperature source Oral, resp. rate 16, height 5\' 10"  (1.778 m), weight 130.2 kg (287 lb 0.6 oz), SpO2 98.00%.  Physical Exam: General: Alert and oriented x3, No acute respiratory distress Lungs: Clear to auscultation bilaterally without wheezes or crackles CVS: S1S2 Regular Abdomen: Rash on upper abdominal/chest area, NT, NBS Extremities: No significant cyanosis, clubbing, or edema bilateral lower extremities  Lab Results:  Basename 04/08/11 1050  NA 139  K 3.9  CL 103  CO2 28  GLUCOSE 99  BUN 13  CREATININE 0.67  CALCIUM 8.8  MG --  PHOS --    Basename 04/09/11 0500 04/08/11 1050  WBC 7.5 11.0*  NEUTROABS -- --  HGB 8.6* 8.9*  HCT 26.1* 27.0*  MCV 85.3 84.9  PLT 171 170   No results found for this basename: CKTOTAL:3,CKMB:3,CKMBINDEX:3,TROPONINI:3 in the last 72 hours Micro Results: Recent Results (from the past 240 hour(s))  URINE CULTURE     Status: Normal   Collection Time   04/06/11  5:20 PM      Component Value Range Status Comment   Specimen Description URINE, CLEAN CATCH   Final    Special Requests NONE   Final    Culture  Setup Time [id number]   Final    Colony Count 85,000 COLONIES/ML   Final    Culture     Final    Value: STAPHYLOCOCCUS SPECIES (COAGULASE NEGATIVE)     Note: RIFAMPIN AND GENTAMICIN SHOULD NOT BE USED AS SINGLE DRUGS FOR TREATMENT OF STAPH INFECTIONS.   Report Status 04/09/2011 FINAL   Final    Organism ID, Bacteria STAPHYLOCOCCUS SPECIES (COAGULASE NEGATIVE)   Final      Medications: I have reviewed the patient's complete medication list.  Recommendations:  Rash: Not shingles - Reviewed  Dr. Blair Dolphin and Dr. Toni Arthurs notes   Symptomatic hypotension: resolved.  S/p repair of multiple pseduoarthrosis  As per primary service (Ortho)  Acute on chronic pain: Improving Continue Oxycontin 80 mg Po Q 12 hr Dialudid 1 mg IV q 4 hr prn. Norco 10/325 po q 4 hr prn - Meds for disposition per the primary team at the time of discharge  Acute blood loss anemia, postoperative ? Dilutional, stool for occult blood  negative  Leukocytosis: Resolved Urine culture is growing coagulase negative staph. - On Macrobid per the sensitivities, continue for a week if discharged tomorrow  Constipation/gas pains: Continue simethicone, Colace, senna 100 mg po BID  Chest pain: Resolved Lexiscan study done on 11/01/09 showed normal myocardial perfusion  AAA s/p stent Was seen by Vasc Surgery in f/u 03/30/2011 with a clean report  HTN  continue Accupril.at low dose of 20 mg po daily, and home dose of HCTZ  Hyperlipidemia Resume usual tx plan  Bipolar/depression Continue Venlafaxine, Seroquel.  Patient stable from medical standpoint.    Su Duma M.D. [facility] 04/11/2011, 8:14 AM

## 2011-04-11 NOTE — Progress Notes (Signed)
Agree with above.   Amire Leazer, OTR/L Pager: [phone] 04/11/2011 . 

## 2011-04-11 NOTE — Progress Notes (Signed)
04/11/11 10:30 PT/OT Note: Pt refusing therapy this am due to pain.  Attempted to encourage pt to participate due to premedication, but pt would like to postpone therapy until later.  Will follow-up as able.  04/11/2011 Cephus Shelling, PT, DPT [phone]

## 2011-04-12 DIAGNOSIS — R079 Chest pain, unspecified: Secondary | ICD-10-CM | POA: Diagnosis not present

## 2011-04-12 DIAGNOSIS — L408 Other psoriasis: Secondary | ICD-10-CM | POA: Diagnosis not present

## 2011-04-12 DIAGNOSIS — K5900 Constipation, unspecified: Secondary | ICD-10-CM | POA: Diagnosis not present

## 2011-04-12 DIAGNOSIS — I10 Essential (primary) hypertension: Secondary | ICD-10-CM | POA: Diagnosis not present

## 2011-04-12 MED ORDER — HYDROCODONE-ACETAMINOPHEN 10-325 MG PO TABS: 1.0000 | ORAL_TABLET | Freq: Three times a day (TID) | ORAL | Status: DC

## 2011-04-12 MED ORDER — DIPHENHYDRAMINE HCL 25 MG PO CAPS
25.0000 mg | ORAL_CAPSULE | ORAL | Status: DC | PRN
  Administered 2011-04-12: 25 mg via ORAL
  Filled 2011-04-12: qty 1

## 2011-04-12 MED ORDER — HYDROCODONE-ACETAMINOPHEN 10-325 MG PO TABS
1.0000 | ORAL_TABLET | Freq: Three times a day (TID) | ORAL | Status: DC
  Administered 2011-04-12 – 2011-04-13 (×3): 1 via ORAL
  Filled 2011-04-12 (×5): qty 1

## 2011-04-12 NOTE — Progress Notes (Signed)
[facility] CONSULT F/U NOTE   Subjective: States pain is not well controlled (7-8/10), seen first time ambulating today.   Objective: Weight change:   Intake/Output Summary (Last 24 hours) at 04/12/11 0757 Last data filed at 04/12/11 0500  Gross per 24 hour  Intake    840 ml  Output    850 ml  Net    -10 ml   Blood pressure 117/77, pulse 82, temperature 98.1 F (36.7 C), temperature source Oral, resp. rate 18, height 5\' 10"  (1.778 m), weight 130.2 kg (287 lb 0.6 oz), SpO2 96.00%.  Physical Exam: General: Alert and oriented x3, No acute respiratory distress Lungs: Clear to auscultation bilaterally without wheezes or crackles CVS: S1S2 Regular Abdomen: Rash on upper abdominal/chest area, NT, NBS Extremities: No significant cyanosis, clubbing, or edema bilateral lower extremities  Lab Results: None today  Micro Results: Recent Results (from the past 240 hour(s))  URINE CULTURE     Status: Normal   Collection Time   04/06/11  5:20 PM      Component Value Range Status Comment   Specimen Description URINE, CLEAN CATCH   Final    Special Requests NONE   Final    Culture  Setup Time [id number]   Final    Colony Count 85,000 COLONIES/ML   Final    Culture     Final    Value: STAPHYLOCOCCUS SPECIES (COAGULASE NEGATIVE)     Note: RIFAMPIN AND GENTAMICIN SHOULD NOT BE USED AS SINGLE DRUGS FOR TREATMENT OF STAPH INFECTIONS.   Report Status 04/09/2011 FINAL   Final    Organism ID, Bacteria STAPHYLOCOCCUS SPECIES (COAGULASE NEGATIVE)   Final      Medications: I have reviewed the patient's complete medication list.  Recommendations:  Rash: Not shingles - Reviewed Dr. Blair Dolphin and Dr. Toni Arthurs notes   Symptomatic hypotension: resolved.  S/p repair of multiple pseduoarthrosis  As per primary service (Ortho)  Acute on chronic pain: (Per patient, he is on oxycodone IR 30 mg q 4 hrs scheduled, Norco 10/325 one tab Q8 scheduled, Norco 10/325 one tab Q4 PRN for  breakthrough at home) Continue Oxycontin 80 mg Po Q 12 hr, Norco 10/325 po q 4 hr prn for breakthrough, on IV morphine when necessary inpatient - I have added Norco 10/325 1 tab TID - Meds for disposition per the primary team at the time of discharge  Acute blood loss anemia, postoperative ? Dilutional, stool for occult blood  negative  Leukocytosis: Resolved Urine culture is growing coagulase negative staph. - On Macrobid per the sensitivities, continue for 5days if discharged tomorrow  Constipation/gas pains: Continue simethicone, Colace, senna 100 mg po BID  Chest pain: Resolved Lexiscan study done on 11/01/09 showed normal myocardial perfusion  AAA s/p stent Was seen by Vasc Surgery in f/u 03/30/2011 with a clean report  HTN  continue Accupril.at low dose of 20 mg po daily, and home dose of HCTZ  Hyperlipidemia Resume usual tx plan  Bipolar/depression Continue Venlafaxine, Seroquel.  Patient stable from medical standpoint, ambulating in the room, I will sign off. Please call if any questions.    Chanceler Pullin M.D. [facility] 04/12/2011, 7:57 AM

## 2011-04-12 NOTE — Progress Notes (Signed)
Physical Therapy Treatment Patient Details Name: Michael Lutz MRN: [id number] DOB: [birth year] Today's Date: 04/12/2011  PT Assessment/Plan  PT - Assessment/Plan Comments on Treatment Session: Pt admitted s/p lumbar fusion and continues to be limited by pain.  Physically pt is progressing and getting stronger being able to negotiate stairs yesterday.  Limited this am by increased pain to 9/10 in back.  Pt repositioned and called RN on call bell to make aware.  Pt still able to ambulate a short distance today, but declines further distance currently due to pain. PT Plan: Discharge plan remains appropriate;Frequency remains appropriate PT Frequency: Min 5X/week Follow Up Recommendations: Home health PT;Supervision/Assistance - 24 hour Equipment Recommended: None recommended by PT PT Goals  Acute Rehab PT Goals PT Goal Formulation: With patient Time For Goal Achievement: 7 days PT Goal: Sit to Supine/Side - Progress: Progressing toward goal PT Goal: Sit to Stand - Progress: Progressing toward goal PT Goal: Stand to Sit - Progress: Progressing toward goal PT Goal: Ambulate - Progress: Progressing toward goal  PT Treatment Precautions/Restrictions  Precautions Precautions: Back;Fall Precaution Booklet Issued: No Precaution Comments: Pt unable to recall any of the back precautions today.  Re-educated on 3/3 back precautions. Required Braces or Orthoses: Yes Spinal Brace: Thoracolumbosacral orthotic;Applied in sitting position (Use PRN for pain control per Dr. Alveda Reasons.) Restrictions Weight Bearing Restrictions: No Pain 9/10 in back.  Pt repositioned with RN contacted via call bell. Mobility (including Balance) Bed Mobility Bed Mobility: Yes Rolling Right: 6: Modified independent (Device/Increase time) Rolling Left: Not tested (comment) Left Sidelying to Sit: Not tested (comment) Sit to Supine: Not Tested (comment) Sit to Sidelying Left: 5: Supervision;HOB elevated (comment degrees)  (HOB 30 degrees.) Sit to Sidelying Left Details (indicate cue type and reason): Verbal cues for safest sequence to protect back following 3/3 back precautions. Transfers Transfers: Yes Sit to Stand: 4: Min assist;From elevated surface;From toilet;From bed (Several trials up to min assist.) Sit to Stand Details (indicate cue type and reason): Assist to translate trunk anterior over BOS from low surface due to pain.  Cues for safest hand placement. Stand to Sit: 5: Supervision;To bed;With upper extremity assist (Several trials.) Stand to Sit Details: Verbal cues for safest sequence with proper hand placement. Ambulation/Gait Ambulation/Gait: Yes Ambulation/Gait Assistance: 5: Supervision Ambulation/Gait Assistance Details (indicate cue type and reason): Verbal cues for tall posture and safety inside RW with pt tendency to walk outside RW. Ambulation Distance (Feet): 20 Feet (Distance limited by pain.) Assistive device: Rolling walker Gait Pattern: Decreased step length - right;Decreased step length - left;Antalgic;Trunk flexed Gait velocity: Decreased gait speed Stairs: No Stairs Assistance: Not tested (comment) Wheelchair Mobility Wheelchair Mobility: No  Posture/Postural Control Posture/Postural Control: No significant limitations Balance Balance Assessed: No End of Session PT - End of Session Activity Tolerance: Patient limited by pain Patient left: in bed;with call bell in reach Nurse Communication: Mobility status for ambulation;Mobility status for transfers General Behavior During Session: [facility] for tasks performed Cognition: [facility] for tasks performed  Cephus Shelling 04/12/2011, 11:26 AM  04/12/2011 Cephus Shelling, PT, DPT [phone]

## 2011-04-12 NOTE — Progress Notes (Signed)
VS's stable, afebrile PT note reviewed Has been supplied with a new TLSO  Pain control is improving  O/E: seated upright in chair  Ass't: Pain control improving  Plan: Home tomorrow, provided pain control adequate

## 2011-04-13 MED ORDER — OXYCODONE HCL 80 MG PO TB12: 80.0000 mg | ORAL_TABLET | Freq: Two times a day (BID) | ORAL | Status: AC

## 2011-04-13 MED ORDER — HYDROCODONE-ACETAMINOPHEN 10-325 MG PO TABS: 1.0000 | ORAL_TABLET | ORAL | Status: AC | PRN

## 2011-04-13 MED ORDER — HYDROCODONE-ACETAMINOPHEN 10-325 MG PO TABS: 1.0000 | ORAL_TABLET | ORAL | Status: DC | PRN

## 2011-04-13 MED ORDER — NITROFURANTOIN MONOHYD MACRO 100 MG PO CAPS: 100.0000 mg | ORAL_CAPSULE | Freq: Two times a day (BID) | ORAL | Status: AC

## 2011-04-13 NOTE — Progress Notes (Signed)
04/13/11 11:50 OT/PT Note: Pt declined therapy today stating that he is "all set to go home today."  Pt has been able to continue to increase independence daily.  Will follow as able.  04/13/2011 Cephus Shelling, PT, DPT [phone]

## 2011-04-13 NOTE — Progress Notes (Signed)
CARE MANAGEMENT NOTE 04/13/2011  Discharge planning. Case Manager spoke with patient, choice offered. Has rolling walker at home, has brace on.Family assistance.

## 2011-04-13 NOTE — Progress Notes (Signed)
Referred to this CSW today for ?SNF. Chart reviewed and have spoken with RNCM and Care Coordinator who indicate patient plans to d/c home with HH and DME. CSW to sign off- please contact us if SW needs arise. Patra Gherardi, MSW, LCSWA [phone]   

## 2011-04-13 NOTE — Progress Notes (Signed)
  Patient Details:  Michael Lutz 10/18/1945 [id number]  Seen in room 5002  Ready for discharge today.  Will give Rx's for meds.  RTO one month  Trevelle Mcgurn,S [place] 04/13/2011, 1:23 PM

## 2011-04-13 NOTE — Progress Notes (Signed)
Pt discharged to home via wheelchair with family. Discharge instructions given and pt verbalized understanding. No further questions at this time.

## 2011-04-13 NOTE — Discharge Summary (Signed)
Dictation [id number]

## 2011-04-13 NOTE — Progress Notes (Signed)
Agree with above.  [name], OTR/L Pager: [phone] 04/13/2011 . 

## 2011-04-14 DIAGNOSIS — Z5189 Encounter for other specified aftercare: Secondary | ICD-10-CM | POA: Diagnosis not present

## 2011-04-14 DIAGNOSIS — M4120 Other idiopathic scoliosis, site unspecified: Secondary | ICD-10-CM | POA: Diagnosis not present

## 2011-04-14 DIAGNOSIS — I739 Peripheral vascular disease, unspecified: Secondary | ICD-10-CM | POA: Diagnosis not present

## 2011-04-14 DIAGNOSIS — M961 Postlaminectomy syndrome, not elsewhere classified: Secondary | ICD-10-CM | POA: Diagnosis not present

## 2011-04-14 DIAGNOSIS — F319 Bipolar disorder, unspecified: Secondary | ICD-10-CM | POA: Diagnosis not present

## 2011-04-14 NOTE — Discharge Summary (Signed)
NAMEJERROLD, [name] NO.:  [id number]  MEDICAL RECORD NO.:  [id number]  LOCATION:  5002                         FACILITY:  [facility]  PHYSICIAN:  Nelda Severe, MD      DATE OF BIRTH:  1945-11-22  DATE OF ADMISSION:  04/04/2011 DATE OF DISCHARGE:  04/13/2011                              DISCHARGE SUMMARY   This man was admitted for management of pseudoarthroses of the thoracolumbar spine, status post thoracolumbar fusion.  The day of admission, he was taken to the operating room where repair of pseudoarthroses and extension of fusion more proximally into the thoracic spine was carried out along with revision of instrumentation.  Postoperatively, the only problem really has been one of pain management.  He has blisters on his presternal area from prolonged pressure while on prone positioning.  This has been misdiagnosed as herpes zoster at one point.  He has been treated with local care and ointment.  At this time, he is ambulatory with a walker.  He is being discharged to the care of his family.  We will arrange for a home health evaluation and supervision and removal of sutures in approximately 1 week's time. He will be followed up in the office in about 1 month.  Prescriptions for OxyContin, Norco 10, nitrofurantoin were provided.  He did have an incidental urinary tract infection for which nitrofurantoin was prescribed by the hospitalist.  CONDITION ON DISCHARGE:  Stable and ambulatory.  FINAL DIAGNOSIS:  Status post thoracolumbar fusion with multiple pseudoarthroses.     Nelda Severe, MD     MT/MEDQ  D:  04/13/2011  T:  04/14/2011  Job:  [id number]

## 2011-04-15 DIAGNOSIS — I739 Peripheral vascular disease, unspecified: Secondary | ICD-10-CM | POA: Diagnosis not present

## 2011-04-15 DIAGNOSIS — F319 Bipolar disorder, unspecified: Secondary | ICD-10-CM | POA: Diagnosis not present

## 2011-04-15 DIAGNOSIS — M961 Postlaminectomy syndrome, not elsewhere classified: Secondary | ICD-10-CM | POA: Diagnosis not present

## 2011-04-15 DIAGNOSIS — Z5189 Encounter for other specified aftercare: Secondary | ICD-10-CM | POA: Diagnosis not present

## 2011-04-15 DIAGNOSIS — M4120 Other idiopathic scoliosis, site unspecified: Secondary | ICD-10-CM | POA: Diagnosis not present

## 2011-04-17 DIAGNOSIS — I739 Peripheral vascular disease, unspecified: Secondary | ICD-10-CM | POA: Diagnosis not present

## 2011-04-17 DIAGNOSIS — M4120 Other idiopathic scoliosis, site unspecified: Secondary | ICD-10-CM | POA: Diagnosis not present

## 2011-04-17 DIAGNOSIS — F319 Bipolar disorder, unspecified: Secondary | ICD-10-CM | POA: Diagnosis not present

## 2011-04-17 DIAGNOSIS — M961 Postlaminectomy syndrome, not elsewhere classified: Secondary | ICD-10-CM | POA: Diagnosis not present

## 2011-04-17 DIAGNOSIS — Z5189 Encounter for other specified aftercare: Secondary | ICD-10-CM | POA: Diagnosis not present

## 2011-04-18 DIAGNOSIS — F3111 Bipolar disorder, current episode manic without psychotic features, mild: Secondary | ICD-10-CM | POA: Diagnosis not present

## 2011-04-19 DIAGNOSIS — M961 Postlaminectomy syndrome, not elsewhere classified: Secondary | ICD-10-CM | POA: Diagnosis not present

## 2011-04-19 DIAGNOSIS — F319 Bipolar disorder, unspecified: Secondary | ICD-10-CM | POA: Diagnosis not present

## 2011-04-19 DIAGNOSIS — Z5189 Encounter for other specified aftercare: Secondary | ICD-10-CM | POA: Diagnosis not present

## 2011-04-19 DIAGNOSIS — M4120 Other idiopathic scoliosis, site unspecified: Secondary | ICD-10-CM | POA: Diagnosis not present

## 2011-04-19 DIAGNOSIS — I739 Peripheral vascular disease, unspecified: Secondary | ICD-10-CM | POA: Diagnosis not present

## 2011-04-21 DIAGNOSIS — I739 Peripheral vascular disease, unspecified: Secondary | ICD-10-CM | POA: Diagnosis not present

## 2011-04-21 DIAGNOSIS — Z5189 Encounter for other specified aftercare: Secondary | ICD-10-CM | POA: Diagnosis not present

## 2011-04-21 DIAGNOSIS — F319 Bipolar disorder, unspecified: Secondary | ICD-10-CM | POA: Diagnosis not present

## 2011-04-21 DIAGNOSIS — M4120 Other idiopathic scoliosis, site unspecified: Secondary | ICD-10-CM | POA: Diagnosis not present

## 2011-04-21 DIAGNOSIS — M961 Postlaminectomy syndrome, not elsewhere classified: Secondary | ICD-10-CM | POA: Diagnosis not present

## 2011-04-24 DIAGNOSIS — I739 Peripheral vascular disease, unspecified: Secondary | ICD-10-CM | POA: Diagnosis not present

## 2011-04-24 DIAGNOSIS — Z5189 Encounter for other specified aftercare: Secondary | ICD-10-CM | POA: Diagnosis not present

## 2011-04-24 DIAGNOSIS — M4120 Other idiopathic scoliosis, site unspecified: Secondary | ICD-10-CM | POA: Diagnosis not present

## 2011-04-24 DIAGNOSIS — M961 Postlaminectomy syndrome, not elsewhere classified: Secondary | ICD-10-CM | POA: Diagnosis not present

## 2011-04-24 DIAGNOSIS — F319 Bipolar disorder, unspecified: Secondary | ICD-10-CM | POA: Diagnosis not present

## 2011-04-25 DIAGNOSIS — M961 Postlaminectomy syndrome, not elsewhere classified: Secondary | ICD-10-CM | POA: Diagnosis not present

## 2011-04-25 DIAGNOSIS — M4120 Other idiopathic scoliosis, site unspecified: Secondary | ICD-10-CM | POA: Diagnosis not present

## 2011-04-25 DIAGNOSIS — F319 Bipolar disorder, unspecified: Secondary | ICD-10-CM | POA: Diagnosis not present

## 2011-04-25 DIAGNOSIS — Z5189 Encounter for other specified aftercare: Secondary | ICD-10-CM | POA: Diagnosis not present

## 2011-04-25 DIAGNOSIS — I739 Peripheral vascular disease, unspecified: Secondary | ICD-10-CM | POA: Diagnosis not present

## 2011-04-26 DIAGNOSIS — M961 Postlaminectomy syndrome, not elsewhere classified: Secondary | ICD-10-CM | POA: Diagnosis not present

## 2011-04-26 DIAGNOSIS — F319 Bipolar disorder, unspecified: Secondary | ICD-10-CM | POA: Diagnosis not present

## 2011-04-26 DIAGNOSIS — M4120 Other idiopathic scoliosis, site unspecified: Secondary | ICD-10-CM | POA: Diagnosis not present

## 2011-04-26 DIAGNOSIS — I739 Peripheral vascular disease, unspecified: Secondary | ICD-10-CM | POA: Diagnosis not present

## 2011-04-26 DIAGNOSIS — Z5189 Encounter for other specified aftercare: Secondary | ICD-10-CM | POA: Diagnosis not present

## 2011-04-27 DIAGNOSIS — M4120 Other idiopathic scoliosis, site unspecified: Secondary | ICD-10-CM | POA: Diagnosis not present

## 2011-04-27 DIAGNOSIS — F319 Bipolar disorder, unspecified: Secondary | ICD-10-CM | POA: Diagnosis not present

## 2011-04-27 DIAGNOSIS — Z5189 Encounter for other specified aftercare: Secondary | ICD-10-CM | POA: Diagnosis not present

## 2011-04-27 DIAGNOSIS — M961 Postlaminectomy syndrome, not elsewhere classified: Secondary | ICD-10-CM | POA: Diagnosis not present

## 2011-04-27 DIAGNOSIS — I739 Peripheral vascular disease, unspecified: Secondary | ICD-10-CM | POA: Diagnosis not present

## 2011-05-01 DIAGNOSIS — M4120 Other idiopathic scoliosis, site unspecified: Secondary | ICD-10-CM | POA: Diagnosis not present

## 2011-05-01 DIAGNOSIS — Z5189 Encounter for other specified aftercare: Secondary | ICD-10-CM | POA: Diagnosis not present

## 2011-05-01 DIAGNOSIS — M961 Postlaminectomy syndrome, not elsewhere classified: Secondary | ICD-10-CM | POA: Diagnosis not present

## 2011-05-01 DIAGNOSIS — I739 Peripheral vascular disease, unspecified: Secondary | ICD-10-CM | POA: Diagnosis not present

## 2011-05-01 DIAGNOSIS — F319 Bipolar disorder, unspecified: Secondary | ICD-10-CM | POA: Diagnosis not present

## 2011-05-02 DIAGNOSIS — F319 Bipolar disorder, unspecified: Secondary | ICD-10-CM | POA: Diagnosis not present

## 2011-05-02 DIAGNOSIS — Z5189 Encounter for other specified aftercare: Secondary | ICD-10-CM | POA: Diagnosis not present

## 2011-05-02 DIAGNOSIS — M4120 Other idiopathic scoliosis, site unspecified: Secondary | ICD-10-CM | POA: Diagnosis not present

## 2011-05-02 DIAGNOSIS — M961 Postlaminectomy syndrome, not elsewhere classified: Secondary | ICD-10-CM | POA: Diagnosis not present

## 2011-05-02 DIAGNOSIS — I739 Peripheral vascular disease, unspecified: Secondary | ICD-10-CM | POA: Diagnosis not present

## 2011-05-03 DIAGNOSIS — M961 Postlaminectomy syndrome, not elsewhere classified: Secondary | ICD-10-CM | POA: Diagnosis not present

## 2011-05-03 DIAGNOSIS — F319 Bipolar disorder, unspecified: Secondary | ICD-10-CM | POA: Diagnosis not present

## 2011-05-03 DIAGNOSIS — I739 Peripheral vascular disease, unspecified: Secondary | ICD-10-CM | POA: Diagnosis not present

## 2011-05-03 DIAGNOSIS — Z5189 Encounter for other specified aftercare: Secondary | ICD-10-CM | POA: Diagnosis not present

## 2011-05-03 DIAGNOSIS — M4120 Other idiopathic scoliosis, site unspecified: Secondary | ICD-10-CM | POA: Diagnosis not present

## 2011-05-04 DIAGNOSIS — G8918 Other acute postprocedural pain: Secondary | ICD-10-CM | POA: Diagnosis not present

## 2011-05-04 DIAGNOSIS — M961 Postlaminectomy syndrome, not elsewhere classified: Secondary | ICD-10-CM | POA: Diagnosis not present

## 2011-05-05 DIAGNOSIS — M961 Postlaminectomy syndrome, not elsewhere classified: Secondary | ICD-10-CM | POA: Diagnosis not present

## 2011-05-05 DIAGNOSIS — F319 Bipolar disorder, unspecified: Secondary | ICD-10-CM | POA: Diagnosis not present

## 2011-05-05 DIAGNOSIS — I739 Peripheral vascular disease, unspecified: Secondary | ICD-10-CM | POA: Diagnosis not present

## 2011-05-05 DIAGNOSIS — Z5189 Encounter for other specified aftercare: Secondary | ICD-10-CM | POA: Diagnosis not present

## 2011-05-05 DIAGNOSIS — M4120 Other idiopathic scoliosis, site unspecified: Secondary | ICD-10-CM | POA: Diagnosis not present

## 2011-05-08 DIAGNOSIS — Z5189 Encounter for other specified aftercare: Secondary | ICD-10-CM | POA: Diagnosis not present

## 2011-05-08 DIAGNOSIS — M961 Postlaminectomy syndrome, not elsewhere classified: Secondary | ICD-10-CM | POA: Diagnosis not present

## 2011-05-08 DIAGNOSIS — F319 Bipolar disorder, unspecified: Secondary | ICD-10-CM | POA: Diagnosis not present

## 2011-05-08 DIAGNOSIS — I739 Peripheral vascular disease, unspecified: Secondary | ICD-10-CM | POA: Diagnosis not present

## 2011-05-08 DIAGNOSIS — M4120 Other idiopathic scoliosis, site unspecified: Secondary | ICD-10-CM | POA: Diagnosis not present

## 2011-05-10 DIAGNOSIS — M961 Postlaminectomy syndrome, not elsewhere classified: Secondary | ICD-10-CM | POA: Diagnosis not present

## 2011-05-10 DIAGNOSIS — M4120 Other idiopathic scoliosis, site unspecified: Secondary | ICD-10-CM | POA: Diagnosis not present

## 2011-05-10 DIAGNOSIS — F319 Bipolar disorder, unspecified: Secondary | ICD-10-CM | POA: Diagnosis not present

## 2011-05-10 DIAGNOSIS — I739 Peripheral vascular disease, unspecified: Secondary | ICD-10-CM | POA: Diagnosis not present

## 2011-05-10 DIAGNOSIS — Z5189 Encounter for other specified aftercare: Secondary | ICD-10-CM | POA: Diagnosis not present

## 2011-05-10 DIAGNOSIS — R21 Rash and other nonspecific skin eruption: Secondary | ICD-10-CM | POA: Diagnosis not present

## 2011-06-01 DIAGNOSIS — IMO0002 Reserved for concepts with insufficient information to code with codable children: Secondary | ICD-10-CM | POA: Diagnosis not present

## 2011-06-01 DIAGNOSIS — M961 Postlaminectomy syndrome, not elsewhere classified: Secondary | ICD-10-CM | POA: Diagnosis not present

## 2011-06-07 DIAGNOSIS — M546 Pain in thoracic spine: Secondary | ICD-10-CM | POA: Diagnosis not present

## 2011-06-07 DIAGNOSIS — M545 Low back pain, unspecified: Secondary | ICD-10-CM | POA: Diagnosis not present

## 2011-06-20 DIAGNOSIS — M25569 Pain in unspecified knee: Secondary | ICD-10-CM | POA: Diagnosis not present

## 2011-06-20 DIAGNOSIS — M25519 Pain in unspecified shoulder: Secondary | ICD-10-CM | POA: Diagnosis not present

## 2011-06-27 DIAGNOSIS — G8928 Other chronic postprocedural pain: Secondary | ICD-10-CM | POA: Diagnosis not present

## 2011-06-27 DIAGNOSIS — M961 Postlaminectomy syndrome, not elsewhere classified: Secondary | ICD-10-CM | POA: Diagnosis not present

## 2011-06-27 DIAGNOSIS — IMO0002 Reserved for concepts with insufficient information to code with codable children: Secondary | ICD-10-CM | POA: Diagnosis not present

## 2011-06-28 DIAGNOSIS — E7800 Pure hypercholesterolemia, unspecified: Secondary | ICD-10-CM | POA: Diagnosis not present

## 2011-06-28 DIAGNOSIS — I10 Essential (primary) hypertension: Secondary | ICD-10-CM | POA: Diagnosis not present

## 2011-06-28 DIAGNOSIS — R413 Other amnesia: Secondary | ICD-10-CM | POA: Diagnosis not present

## 2011-07-05 DIAGNOSIS — Z981 Arthrodesis status: Secondary | ICD-10-CM | POA: Diagnosis not present

## 2011-07-07 ENCOUNTER — Encounter (HOSPITAL_COMMUNITY): Payer: Self-pay | Admitting: Respiratory Therapy

## 2011-07-13 ENCOUNTER — Encounter (HOSPITAL_COMMUNITY)
Admission: RE | Admit: 2011-07-13 | Discharge: 2011-07-13 | Disposition: A | Discharge status: Home / Self Care | Payer: Medicare Other | Source: Ambulatory Visit | Attending: Orthopedic Surgery | Admitting: Orthopedic Surgery

## 2011-07-13 ENCOUNTER — Other Ambulatory Visit (HOSPITAL_COMMUNITY): Payer: Medicare Other

## 2011-07-13 ENCOUNTER — Ambulatory Visit (HOSPITAL_COMMUNITY)
Admission: RE | Admit: 2011-07-13 | Discharge: 2011-07-13 | Disposition: A | Discharge status: Home / Self Care | Payer: Medicare Other | Source: Ambulatory Visit | Attending: Anesthesiology | Admitting: Anesthesiology

## 2011-07-13 ENCOUNTER — Encounter (HOSPITAL_COMMUNITY): Payer: Self-pay

## 2011-07-13 DIAGNOSIS — Z01818 Encounter for other preprocedural examination: Secondary | ICD-10-CM | POA: Insufficient documentation

## 2011-07-13 DIAGNOSIS — I6529 Occlusion and stenosis of unspecified carotid artery: Secondary | ICD-10-CM | POA: Diagnosis not present

## 2011-07-13 DIAGNOSIS — Z981 Arthrodesis status: Secondary | ICD-10-CM | POA: Diagnosis not present

## 2011-07-13 DIAGNOSIS — I517 Cardiomegaly: Secondary | ICD-10-CM | POA: Diagnosis not present

## 2011-07-13 DIAGNOSIS — I77819 Aortic ectasia, unspecified site: Secondary | ICD-10-CM | POA: Insufficient documentation

## 2011-07-13 DIAGNOSIS — Z01812 Encounter for preprocedural laboratory examination: Secondary | ICD-10-CM | POA: Insufficient documentation

## 2011-07-13 DIAGNOSIS — F17200 Nicotine dependence, unspecified, uncomplicated: Secondary | ICD-10-CM | POA: Insufficient documentation

## 2011-07-13 DIAGNOSIS — I10 Essential (primary) hypertension: Secondary | ICD-10-CM | POA: Insufficient documentation

## 2011-07-13 DIAGNOSIS — Z01811 Encounter for preprocedural respiratory examination: Secondary | ICD-10-CM | POA: Diagnosis not present

## 2011-07-13 HISTORY — DX: Reserved for inherently not codable concepts without codable children: IMO0001

## 2011-07-13 HISTORY — DX: Gastro-esophageal reflux disease without esophagitis: K21.9

## 2011-07-13 HISTORY — DX: Localized edema: R60.0

## 2011-07-13 HISTORY — DX: Edema, unspecified: R60.9

## 2011-07-13 HISTORY — DX: Personal history of other infectious and parasitic diseases: Z86.19

## 2011-07-13 HISTORY — DX: Urgency of urination: R39.15

## 2011-07-13 HISTORY — DX: Nocturia: R35.1

## 2011-07-13 HISTORY — DX: Other amnesia: R41.3

## 2011-07-13 HISTORY — DX: Allergy status to unspecified drugs, medicaments and biological substances: Z88.9

## 2011-07-13 HISTORY — DX: Encounter for other specified aftercare: Z51.89

## 2011-07-13 HISTORY — DX: Insomnia, unspecified: G47.00

## 2011-07-13 HISTORY — DX: Benign prostatic hyperplasia without lower urinary tract symptoms: N40.0

## 2011-07-13 HISTORY — DX: Bipolar disorder, unspecified: F31.9

## 2011-07-13 LAB — APTT: aPTT: 37 seconds (ref 24–37)

## 2011-07-13 LAB — COMPREHENSIVE METABOLIC PANEL
ALT: 13 U/L (ref 0–53)
AST: 18 U/L (ref 0–37)
Albumin: 3.8 g/dL (ref 3.5–5.2)
Alkaline Phosphatase: 101 U/L (ref 39–117)
BUN: 20 mg/dL (ref 6–23)
CO2: 28 mEq/L (ref 19–32)
Calcium: 9.6 mg/dL (ref 8.4–10.5)
Chloride: 103 mEq/L (ref 96–112)
Creatinine, Ser: 0.91 mg/dL (ref 0.50–1.35)
GFR calc Af Amer: >90 mL/min (ref >90)
GFR calc non Af Amer: 87 mL/min — ABNORMAL LOW (ref >90)
Glucose, Bld: 83 mg/dL (ref 70–99)
Potassium: 3.5 mEq/L (ref 3.5–5.1)
Sodium: 139 mEq/L (ref 135–145)
Total Bilirubin: 0.2 mg/dL — ABNORMAL LOW (ref 0.3–1.2)
Total Protein: 7 g/dL (ref 6.0–8.3)

## 2011-07-13 LAB — PROTIME-INR
INR: 0.99 (ref 0.00–1.49)
Prothrombin Time: 13.3 seconds (ref 11.6–15.2)

## 2011-07-13 LAB — URINALYSIS, ROUTINE W REFLEX MICROSCOPIC
Bilirubin Urine: NEGATIVE
Glucose, UA: NEGATIVE mg/dL
Hgb urine dipstick: NEGATIVE
Ketones, ur: NEGATIVE mg/dL
Leukocytes, UA: NEGATIVE
Nitrite: NEGATIVE
Protein, ur: NEGATIVE mg/dL
Specific Gravity, Urine: 1.023 (ref 1.005–1.030)
Urobilinogen, UA: 0.2 mg/dL (ref 0.0–1.0)
pH: 6 (ref 5.0–8.0)

## 2011-07-13 LAB — DIFFERENTIAL
Basophils Absolute: 0 10*3/uL (ref 0.0–0.1)
Basophils Relative: 0 % (ref 0–1)
Eosinophils Absolute: 0.2 10*3/uL (ref 0.0–0.7)
Eosinophils Relative: 3 % (ref 0–5)
Lymphocytes Relative: 20 % (ref 12–46)
Lymphs Abs: 1.6 10*3/uL (ref 0.7–4.0)
Monocytes Absolute: 0.5 10*3/uL (ref 0.1–1.0)
Monocytes Relative: 6 % (ref 3–12)
Neutro Abs: 5.9 10*3/uL (ref 1.7–7.7)
Neutrophils Relative %: 71 % (ref 43–77)

## 2011-07-13 LAB — CBC
HCT: 39.3 % (ref 39.0–52.0)
Hemoglobin: 12.5 g/dL — ABNORMAL LOW (ref 13.0–17.0)
MCH: 23.2 pg — ABNORMAL LOW (ref 26.0–34.0)
MCHC: 31.8 g/dL (ref 30.0–36.0)
MCV: 72.9 fL — ABNORMAL LOW (ref 78.0–100.0)
Platelets: 202 10*3/uL (ref 150–400)
RBC: 5.39 MIL/uL (ref 4.22–5.81)
RDW: 18.3 % — ABNORMAL HIGH (ref 11.5–15.5)
WBC: 8.5 10*3/uL (ref 4.0–10.5)

## 2011-07-13 LAB — SURGICAL PCR SCREEN
MRSA, PCR: NEGATIVE
Staphylococcus aureus: NEGATIVE

## 2011-07-13 LAB — TYPE AND SCREEN
ABO/RH(D): O POS
Antibody Screen: NEGATIVE

## 2011-07-13 MED ORDER — CHLORHEXIDINE GLUCONATE 4 % EX LIQD: 60.0000 mL | Freq: Once | CUTANEOUS | Status: DC

## 2011-07-13 NOTE — Progress Notes (Signed)
Cardiologist is with [facility] but can't remember his name;last visit within last 14months--to request report  Stress test done many yrs ago  Echo done in Jan 2013-to request report  Heart cath done >53yrs ago  Medical Md is Dr.Lisa Hyacinth Meeker with [facility]

## 2011-07-13 NOTE — Pre-Procedure Instructions (Signed)
21 Michael Lutz  07/13/2011   Your procedure is scheduled on:  Tues, May 7 @ 1:15 PM  Report to [facility] at 11:15 AM.  Call this number if you have problems the morning of surgery: [phone]   Remember:   Do not eat food:After Midnight.  May have clear liquids: up to 4 Hours before arrival.(until 7:15 am)  Clear liquids include soda, tea, black coffee, apple or grape juice, broth,water  Take these medicines the morning of surgery with A SIP OF WATER: Amlodipine,Clonazepam,Pain Pill(if needed),Effexor,and Zantac   Do not wear jewelry  Do not wear lotions, powders, or perfumes.   Do not bring valuables to the hospital.  Contacts, dentures or bridgework may not be worn into surgery.  Leave suitcase in the car. After surgery it may be brought to your room.  For patients admitted to the hospital, checkout time is 11:00 AM the day of discharge.   Special Instructions: CHG Shower Use Special Wash: 1/2 bottle night before surgery and 1/2 bottle morning of surgery.   Please read over the following fact sheets that you were given: Pain Booklet, Coughing and Deep Breathing, Blood Transfusion Information, MRSA Information and Surgical Site Infection Prevention

## 2011-07-14 ENCOUNTER — Ambulatory Visit (HOSPITAL_COMMUNITY)
Admission: RE | Admit: 2011-07-14 | Discharge: 2011-07-14 | Disposition: A | Discharge status: Home / Self Care | Payer: Medicare Other | Source: Ambulatory Visit | Attending: Orthopedic Surgery | Admitting: Orthopedic Surgery

## 2011-07-14 DIAGNOSIS — R0989 Other specified symptoms and signs involving the circulatory and respiratory systems: Secondary | ICD-10-CM | POA: Insufficient documentation

## 2011-07-14 DIAGNOSIS — Z9889 Other specified postprocedural states: Secondary | ICD-10-CM | POA: Diagnosis not present

## 2011-07-14 DIAGNOSIS — Z01810 Encounter for preprocedural cardiovascular examination: Secondary | ICD-10-CM | POA: Diagnosis not present

## 2011-07-14 NOTE — Progress Notes (Signed)
VASCULAR LAB PRELIMINARY  PRELIMINARY  PRELIMINARY  PRELIMINARY  Carotid duplex completed.    Preliminary report:  Bilateral:  No evidence of hemodynamically significant internal carotid artery stenosis.   Vertebral artery flow is antegrade.     Farida Mcreynolds D, RVS 07/14/2011, 1:59 PM

## 2011-07-17 MED ORDER — SODIUM CHLORIDE 0.9 % IV SOLN: 75.0000 mL/h | INTRAVENOUS | Status: DC

## 2011-07-17 MED ORDER — CEFAZOLIN SODIUM-DEXTROSE 2-3 GM-% IV SOLR
2.0000 g | INTRAVENOUS | Status: AC
  Administered 2011-07-18: 2 g via INTRAVENOUS
  Filled 2011-07-17: qty 50

## 2011-07-17 NOTE — H&P (Signed)
  NAME: Michael Lutz MRN: #[id number] DATE: Jul 13, 2011 DOB: [birth year]  HISTORY OF PRESENT ILLNESS:  Michael Lutz returns really for his preoperative physical in anticipation of having surgery next week, on May 7.  He is going to have revision of the upper end of his thoracic fixation which had pulled out and extension of the thoracic fusion.  We explained to him what we would be doing. He presented a few weeks ago with a lump under his skin in the upper end of his spine which he has a lot of pain in when he lies down. Radiographs at that time revealed his upper thoracic fixation to have become dislodged.  PHYSICAL EXAMINATION:  Physical examination today reveals a man in no real distress.  He is ambulatory. He is wearing a TLSO. He is mildly stooped forward. He appears his stated age.  Head, ears, eyes, nose and throat and extraocular motion is normal. Oropharynx is clear, upper and lower dentures, no cervical lymphadenopathy.  Cardiovascular:  Heart regular rhythm, no peripheral edema, normal heart sounds, low-grade systolic murmur, bilateral carotid bruits left greater than right.  Respiratory:  Chest is clear to auscultation.  Abdomen: Obese and nontender.  Genitourinary-not examined.  Musculoskeletal/neurological-normal gait. No weakness on manual resistance testing in either lower extremity. Hypoactive knee jerks and ankle jerks. Sensation is intact in both lower extremities.  DIAGNOSES:  Status post thoracolumbar fusion revision for pseudoarthrosis, status post thoracolumbar fusion for scoliosis; failed upper fixation.  DISCUSSION:  We discussed with him again what we are going to be doing, extending the fusion somewhat more proximally to get fresh bone to put the hooks on. We'll probably use 2 pairs of down facing hooks. We will contour his rod a little to more lessen the likelihood for hook pullout  Continued  It is anticipated he will in the hospital 2 or 3 days. He doesn't want to  have to go to skilled nursing facility afterwards, and I don't think that will be necessary.  His nephew was with him and can help out.  I offered to give him the informed consent discussion, particularly complications, but he has had this numerous before and was not particularly anxious to hear it all again.  ADDENDUM:  Because of his carotid bruits I arranged for him to get Doppler studies of his carotid arteries on the 3rd of May 2013. These were done and did not show significant stenosis.    Nelda Severe, MD/[id number]  Auto-Authenticated by Nelda Severe, MD

## 2011-07-18 ENCOUNTER — Inpatient Hospital Stay (HOSPITAL_COMMUNITY)
Admission: RE | Admit: 2011-07-18 | Discharge: 2011-07-21 | DRG: 460 | Disposition: A | Discharge status: Home / Self Care | Payer: Medicare Other | Source: Ambulatory Visit | Attending: Orthopedic Surgery | Admitting: Orthopedic Surgery

## 2011-07-18 ENCOUNTER — Encounter (HOSPITAL_COMMUNITY): Payer: Self-pay | Admitting: Anesthesiology

## 2011-07-18 ENCOUNTER — Encounter (HOSPITAL_COMMUNITY): Payer: Self-pay | Admitting: *Deleted

## 2011-07-18 ENCOUNTER — Encounter (HOSPITAL_COMMUNITY)
Admission: RE | Disposition: A | Discharge status: Home / Self Care | Payer: Self-pay | Source: Ambulatory Visit | Attending: Orthopedic Surgery

## 2011-07-18 ENCOUNTER — Inpatient Hospital Stay (HOSPITAL_COMMUNITY): Payer: Medicare Other | Admitting: Anesthesiology

## 2011-07-18 ENCOUNTER — Inpatient Hospital Stay (HOSPITAL_COMMUNITY): Payer: Medicare Other

## 2011-07-18 DIAGNOSIS — M47814 Spondylosis without myelopathy or radiculopathy, thoracic region: Secondary | ICD-10-CM | POA: Diagnosis not present

## 2011-07-18 DIAGNOSIS — Y838 Other surgical procedures as the cause of abnormal reaction of the patient, or of later complication, without mention of misadventure at the time of the procedure: Secondary | ICD-10-CM | POA: Diagnosis present

## 2011-07-18 DIAGNOSIS — M47817 Spondylosis without myelopathy or radiculopathy, lumbosacral region: Secondary | ICD-10-CM | POA: Diagnosis not present

## 2011-07-18 DIAGNOSIS — K560 Paralytic ileus: Secondary | ICD-10-CM | POA: Diagnosis not present

## 2011-07-18 DIAGNOSIS — Z981 Arthrodesis status: Secondary | ICD-10-CM | POA: Diagnosis not present

## 2011-07-18 DIAGNOSIS — T84498A Other mechanical complication of other internal orthopedic devices, implants and grafts, initial encounter: Secondary | ICD-10-CM | POA: Diagnosis not present

## 2011-07-18 DIAGNOSIS — K219 Gastro-esophageal reflux disease without esophagitis: Secondary | ICD-10-CM | POA: Diagnosis not present

## 2011-07-18 DIAGNOSIS — IMO0001 Reserved for inherently not codable concepts without codable children: Secondary | ICD-10-CM

## 2011-07-18 DIAGNOSIS — M545 Low back pain, unspecified: Secondary | ICD-10-CM | POA: Diagnosis not present

## 2011-07-18 DIAGNOSIS — M549 Dorsalgia, unspecified: Secondary | ICD-10-CM | POA: Diagnosis not present

## 2011-07-18 DIAGNOSIS — M79609 Pain in unspecified limb: Secondary | ICD-10-CM | POA: Diagnosis not present

## 2011-07-18 DIAGNOSIS — L309 Dermatitis, unspecified: Secondary | ICD-10-CM

## 2011-07-18 DIAGNOSIS — M6281 Muscle weakness (generalized): Secondary | ICD-10-CM | POA: Diagnosis not present

## 2011-07-18 SURGERY — LAMINECTOMY THORACIC FUSION POSTERIOR
Anesthesia: General | Site: Back | Laterality: Bilateral | Wound class: Clean

## 2011-07-18 MED ORDER — ONDANSETRON HCL 4 MG/2ML IJ SOLN: 4.0000 mg | Freq: Four times a day (QID) | INTRAMUSCULAR | Status: DC | PRN

## 2011-07-18 MED ORDER — ACETAMINOPHEN 650 MG RE SUPP: 650.0000 mg | RECTAL | Status: DC | PRN

## 2011-07-18 MED ORDER — DIPHENHYDRAMINE HCL 25 MG PO CAPS: 50.0000 mg | ORAL_CAPSULE | Freq: Four times a day (QID) | ORAL | Status: DC | PRN

## 2011-07-18 MED ORDER — DOCUSATE SODIUM 100 MG PO CAPS: 100.0000 mg | ORAL_CAPSULE | Freq: Two times a day (BID) | ORAL | Status: DC | PRN

## 2011-07-18 MED ORDER — SODIUM CHLORIDE 0.9 % IR SOLN
Status: DC | PRN
  Administered 2011-07-18: 15:00:00

## 2011-07-18 MED ORDER — SODIUM CHLORIDE 0.9 % IJ SOLN
3.0000 mL | Freq: Two times a day (BID) | INTRAMUSCULAR | Status: DC
  Administered 2011-07-19 – 2011-07-20 (×4): 3 mL via INTRAVENOUS

## 2011-07-18 MED ORDER — SUCCINYLCHOLINE CHLORIDE 20 MG/ML IJ SOLN
INTRAMUSCULAR | Status: DC | PRN
  Administered 2011-07-18: 200 mg via INTRAVENOUS

## 2011-07-18 MED ORDER — NALOXONE HCL 0.4 MG/ML IJ SOLN: 0.4000 mg | INTRAMUSCULAR | Status: DC | PRN

## 2011-07-18 MED ORDER — KCL IN DEXTROSE-NACL 20-5-0.45 MEQ/L-%-% IV SOLN
INTRAVENOUS | Status: DC
  Administered 2011-07-19: 02:00:00 via INTRAVENOUS
  Filled 2011-07-18 (×9): qty 1000

## 2011-07-18 MED ORDER — METHOCARBAMOL 500 MG PO TABS
500.0000 mg | ORAL_TABLET | Freq: Four times a day (QID) | ORAL | Status: DC | PRN
  Administered 2011-07-20: 500 mg via ORAL
  Filled 2011-07-18: qty 1

## 2011-07-18 MED ORDER — VITAMIN D3 25 MCG (1000 UNIT) PO TABS
1000.0000 [IU] | ORAL_TABLET | Freq: Every day | ORAL | Status: DC
  Administered 2011-07-19 – 2011-07-21 (×3): 1000 [IU] via ORAL
  Filled 2011-07-18 (×4): qty 1

## 2011-07-18 MED ORDER — ONDANSETRON HCL 4 MG/2ML IJ SOLN: 4.0000 mg | Freq: Once | INTRAMUSCULAR | Status: AC | PRN

## 2011-07-18 MED ORDER — ZOLPIDEM TARTRATE 10 MG PO TABS
5.0000 mg | ORAL_TABLET | Freq: Every evening | ORAL | Status: DC | PRN
  Administered 2011-07-19: 02:00:00 via ORAL
  Filled 2011-07-18: qty 1

## 2011-07-18 MED ORDER — LACTATED RINGERS IV SOLN
INTRAVENOUS | Status: DC
  Administered 2011-07-18: 13:00:00 via INTRAVENOUS

## 2011-07-18 MED ORDER — LIDOCAINE-EPINEPHRINE (PF) 1 %-1:200000 IJ SOLN
INTRAMUSCULAR | Status: DC | PRN
  Administered 2011-07-18: 8.5 mL

## 2011-07-18 MED ORDER — B COMPLEX PO TABS: 1.0000 | ORAL_TABLET | ORAL | Status: DC

## 2011-07-18 MED ORDER — PHENOL 1.4 % MT LIQD: 1.0000 | OROMUCOSAL | Status: DC | PRN

## 2011-07-18 MED ORDER — LABETALOL HCL 5 MG/ML IV SOLN
INTRAVENOUS | Status: DC | PRN
  Administered 2011-07-18: 10 mg via INTRAVENOUS
  Administered 2011-07-18 (×2): 5 mg via INTRAVENOUS

## 2011-07-18 MED ORDER — DIPHENHYDRAMINE HCL 50 MG/ML IJ SOLN: 12.5000 mg | Freq: Four times a day (QID) | INTRAMUSCULAR | Status: DC | PRN

## 2011-07-18 MED ORDER — MIDAZOLAM HCL 5 MG/5ML IJ SOLN
INTRAMUSCULAR | Status: DC | PRN
  Administered 2011-07-18 (×3): 2 mg via INTRAVENOUS

## 2011-07-18 MED ORDER — LACTATED RINGERS IV SOLN
INTRAVENOUS | Status: DC | PRN
  Administered 2011-07-18 (×3): via INTRAVENOUS

## 2011-07-18 MED ORDER — ACETAMINOPHEN 325 MG PO TABS: 650.0000 mg | ORAL_TABLET | ORAL | Status: DC | PRN

## 2011-07-18 MED ORDER — AMLODIPINE BESYLATE 5 MG PO TABS
5.0000 mg | ORAL_TABLET | Freq: Every day | ORAL | Status: DC
  Administered 2011-07-18 – 2011-07-21 (×4): 5 mg via ORAL
  Filled 2011-07-18 (×4): qty 1

## 2011-07-18 MED ORDER — OXYCODONE HCL 5 MG PO TABS
10.0000 mg | ORAL_TABLET | ORAL | Status: DC | PRN
  Administered 2011-07-19 – 2011-07-20 (×4): 10 mg via ORAL
  Filled 2011-07-18 (×2): qty 2
  Filled 2011-07-18: qty 3
  Filled 2011-07-18: qty 2

## 2011-07-18 MED ORDER — SODIUM CHLORIDE 0.9 % IV SOLN: 250.0000 mL | INTRAVENOUS | Status: DC

## 2011-07-18 MED ORDER — GENTAMICIN SULFATE 40 MG/ML IJ SOLN
INTRAMUSCULAR | Status: DC | PRN
  Administered 2011-07-18: 240 mL

## 2011-07-18 MED ORDER — FENTANYL CITRATE 0.05 MG/ML IJ SOLN
INTRAMUSCULAR | Status: DC | PRN
  Administered 2011-07-18: 100 ug via INTRAVENOUS
  Administered 2011-07-18 (×2): 50 ug via INTRAVENOUS
  Administered 2011-07-18: 100 ug via INTRAVENOUS
  Administered 2011-07-18: 50 ug via INTRAVENOUS
  Administered 2011-07-18: 100 ug via INTRAVENOUS
  Administered 2011-07-18 (×6): 50 ug via INTRAVENOUS

## 2011-07-18 MED ORDER — LISINOPRIL 40 MG PO TABS
40.0000 mg | ORAL_TABLET | Freq: Every day | ORAL | Status: DC
  Administered 2011-07-18 – 2011-07-20 (×3): 40 mg via ORAL
  Filled 2011-07-18 (×3): qty 1

## 2011-07-18 MED ORDER — THROMBIN 20000 UNITS EX KIT
PACK | OROMUCOSAL | Status: DC | PRN
  Administered 2011-07-18: 15:00:00 via TOPICAL

## 2011-07-18 MED ORDER — VANCOMYCIN HCL 1000 MG IV SOLR
1000.0000 mg | INTRAVENOUS | Status: AC
  Administered 2011-07-18: 1000 mg
  Filled 2011-07-18 (×2): qty 1000

## 2011-07-18 MED ORDER — B COMPLEX-C PO TABS
1.0000 | ORAL_TABLET | Freq: Every day | ORAL | Status: DC
  Administered 2011-07-19 – 2011-07-21 (×3): 1 via ORAL
  Filled 2011-07-18 (×3): qty 1

## 2011-07-18 MED ORDER — GABAPENTIN 300 MG PO CAPS
300.0000 mg | ORAL_CAPSULE | Freq: Four times a day (QID) | ORAL | Status: DC
  Administered 2011-07-18 – 2011-07-21 (×11): 300 mg via ORAL
  Filled 2011-07-18 (×13): qty 1

## 2011-07-18 MED ORDER — QUETIAPINE FUMARATE 200 MG PO TABS
200.0000 mg | ORAL_TABLET | Freq: Every day | ORAL | Status: DC
Start: 2011-07-18 — End: 2011-07-21
  Administered 2011-07-18 – 2011-07-20 (×3): 200 mg via ORAL
  Filled 2011-07-18 (×4): qty 1

## 2011-07-18 MED ORDER — LAMOTRIGINE 200 MG PO TABS
200.0000 mg | ORAL_TABLET | Freq: Every day | ORAL | Status: DC
  Administered 2011-07-18 – 2011-07-20 (×3): 200 mg via ORAL
  Filled 2011-07-18 (×4): qty 1

## 2011-07-18 MED ORDER — LIDOCAINE HCL 4 % MT SOLN
OROMUCOSAL | Status: DC | PRN
  Administered 2011-07-18: 4 mL via TOPICAL

## 2011-07-18 MED ORDER — VITAMIN C 500 MG PO TABS
1000.0000 mg | ORAL_TABLET | Freq: Every day | ORAL | Status: DC
Start: 2011-07-18 — End: 2011-07-18

## 2011-07-18 MED ORDER — TRAZODONE HCL 50 MG PO TABS
350.0000 mg | ORAL_TABLET | Freq: Every day | ORAL | Status: DC
  Administered 2011-07-18 – 2011-07-20 (×3): 350 mg via ORAL
  Filled 2011-07-18 (×4): qty 1

## 2011-07-18 MED ORDER — SODIUM CHLORIDE 0.9 % IJ SOLN
INTRAMUSCULAR | Status: DC | PRN
  Administered 2011-07-18: 20 mL

## 2011-07-18 MED ORDER — PSEUDOEPHEDRINE HCL 60 MG PO TABS
60.0000 mg | ORAL_TABLET | Freq: Two times a day (BID) | ORAL | Status: DC
  Administered 2011-07-18 – 2011-07-21 (×6): 60 mg via ORAL
  Filled 2011-07-18 (×7): qty 1

## 2011-07-18 MED ORDER — ONDANSETRON HCL 4 MG/2ML IJ SOLN
INTRAMUSCULAR | Status: DC | PRN
  Administered 2011-07-18: 4 mg via INTRAVENOUS

## 2011-07-18 MED ORDER — BUPIVACAINE HCL (PF) 0.25 % IJ SOLN
INTRAMUSCULAR | Status: DC | PRN
  Administered 2011-07-18: 8.5 mL

## 2011-07-18 MED ORDER — CEFAZOLIN SODIUM-DEXTROSE 2-3 GM-% IV SOLR
2.0000 g | Freq: Three times a day (TID) | INTRAVENOUS | Status: AC
  Administered 2011-07-18 – 2011-07-19 (×2): 2 g via INTRAVENOUS
  Filled 2011-07-18 (×2): qty 50

## 2011-07-18 MED ORDER — VITAMIN C 500 MG PO TABS
1000.0000 mg | ORAL_TABLET | Freq: Every day | ORAL | Status: DC
  Administered 2011-07-18 – 2011-07-21 (×4): 1000 mg via ORAL
  Filled 2011-07-18 (×4): qty 2

## 2011-07-18 MED ORDER — METHOCARBAMOL 100 MG/ML IJ SOLN
500.0000 mg | Freq: Four times a day (QID) | INTRAVENOUS | Status: DC | PRN
  Filled 2011-07-18: qty 5

## 2011-07-18 MED ORDER — PROPOFOL 10 MG/ML IV EMUL
INTRAVENOUS | Status: DC | PRN
  Administered 2011-07-18: 300 mg via INTRAVENOUS

## 2011-07-18 MED ORDER — HYDROCODONE-ACETAMINOPHEN 10-325 MG PO TABS
1.0000 | ORAL_TABLET | ORAL | Status: DC | PRN
  Administered 2011-07-18: 2 via ORAL
  Filled 2011-07-18: qty 2

## 2011-07-18 MED ORDER — BUPIVACAINE LIPOSOME 1.3 % IJ SUSP
20.0000 mL | Freq: Once | INTRAMUSCULAR | Status: AC
  Administered 2011-07-18: 20 mL
  Filled 2011-07-18 (×2): qty 20

## 2011-07-18 MED ORDER — MENTHOL 3 MG MT LOZG: 1.0000 | LOZENGE | OROMUCOSAL | Status: DC | PRN

## 2011-07-18 MED ORDER — SODIUM CHLORIDE 0.9 % IJ SOLN: 3.0000 mL | INTRAMUSCULAR | Status: DC | PRN

## 2011-07-18 MED ORDER — HYDROMORPHONE HCL PF 1 MG/ML IJ SOLN
0.2500 mg | INTRAMUSCULAR | Status: DC | PRN
  Administered 2011-07-18 (×2): 0.5 mg via INTRAVENOUS

## 2011-07-18 MED ORDER — LIDOCAINE HCL (CARDIAC) 20 MG/ML IV SOLN
INTRAVENOUS | Status: DC | PRN
  Administered 2011-07-18: 50 mg via INTRAVENOUS

## 2011-07-18 MED ORDER — MORPHINE SULFATE 4 MG/ML IJ SOLN: 0.0500 mg/kg | INTRAMUSCULAR | Status: DC | PRN

## 2011-07-18 MED ORDER — DIPHENHYDRAMINE HCL 12.5 MG/5ML PO ELIX: 12.5000 mg | ORAL_SOLUTION | Freq: Four times a day (QID) | ORAL | Status: DC | PRN

## 2011-07-18 MED ORDER — HYDROMORPHONE 0.3 MG/ML IV SOLN
INTRAVENOUS | Status: AC
  Administered 2011-07-18: 18:00:00
  Filled 2011-07-18: qty 25

## 2011-07-18 MED ORDER — SODIUM CHLORIDE 0.9 % IJ SOLN: 9.0000 mL | INTRAMUSCULAR | Status: DC | PRN

## 2011-07-18 MED ORDER — METHOCARBAMOL 500 MG PO TABS
1000.0000 mg | ORAL_TABLET | Freq: Three times a day (TID) | ORAL | Status: DC
  Administered 2011-07-18 – 2011-07-21 (×8): 1000 mg via ORAL
  Filled 2011-07-18 (×12): qty 2

## 2011-07-18 MED ORDER — ATORVASTATIN CALCIUM 40 MG PO TABS
40.0000 mg | ORAL_TABLET | Freq: Every day | ORAL | Status: DC
  Administered 2011-07-18 – 2011-07-21 (×4): 40 mg via ORAL
  Filled 2011-07-18 (×4): qty 1

## 2011-07-18 MED ORDER — LACTATED RINGERS IV SOLN: INTRAVENOUS | Status: DC

## 2011-07-18 MED ORDER — CLONAZEPAM 1 MG PO TABS
1.0000 mg | ORAL_TABLET | Freq: Two times a day (BID) | ORAL | Status: DC | PRN
  Administered 2011-07-19: 1 mg via ORAL
  Filled 2011-07-18: qty 1

## 2011-07-18 MED ORDER — VENLAFAXINE HCL ER 150 MG PO CP24
150.0000 mg | ORAL_CAPSULE | Freq: Every day | ORAL | Status: DC
  Administered 2011-07-18 – 2011-07-19 (×2): 150 mg via ORAL
  Filled 2011-07-18 (×2): qty 1

## 2011-07-18 MED ORDER — ONDANSETRON HCL 4 MG/2ML IJ SOLN: 4.0000 mg | INTRAMUSCULAR | Status: DC | PRN

## 2011-07-18 MED ORDER — HYDROMORPHONE 0.3 MG/ML IV SOLN
INTRAVENOUS | Status: DC
  Administered 2011-07-18: 4.2 mg via INTRAVENOUS
  Administered 2011-07-19: 02:00:00 via INTRAVENOUS
  Administered 2011-07-19: 1.5 mg via INTRAVENOUS
  Administered 2011-07-19: 0.6 mg via INTRAVENOUS
  Administered 2011-07-19: 2.7 mg via INTRAVENOUS
  Administered 2011-07-19: 1.8 mg via INTRAVENOUS
  Filled 2011-07-18: qty 25

## 2011-07-18 MED ORDER — PROPOFOL 10 MG/ML IV EMUL
INTRAVENOUS | Status: DC | PRN
  Administered 2011-07-18: 75 ug/kg/min via INTRAVENOUS

## 2011-07-18 MED ORDER — PANTOPRAZOLE SODIUM 40 MG IV SOLR
40.0000 mg | Freq: Every day | INTRAVENOUS | Status: DC
  Administered 2011-07-18 – 2011-07-19 (×2): 40 mg via INTRAVENOUS
  Filled 2011-07-18 (×3): qty 40

## 2011-07-18 MED ORDER — QUINAPRIL HCL 10 MG PO TABS: 80.0000 mg | ORAL_TABLET | Freq: Every day | ORAL | Status: DC

## 2011-07-18 SURGICAL SUPPLY — 83 items
BAG DECANTER FOR FLEXI CONT (MISCELLANEOUS) ×2 IMPLANT
BENZOIN TINCTURE PRP APPL 2/3 (GAUZE/BANDAGES/DRESSINGS) ×4 IMPLANT
BLADE SURG ROTATE 9660 (MISCELLANEOUS) IMPLANT
BUR MATCHSTICK NEURO 3.0 LAGG (BURR) ×2 IMPLANT
CARTRIDGE OIL MAESTRO DRILL (MISCELLANEOUS) ×1 IMPLANT
CLOTH BEACON ORANGE TIMEOUT ST (SAFETY) ×2 IMPLANT
CLSR STERI-STRIP ANTIMIC 1/2X4 (GAUZE/BANDAGES/DRESSINGS) ×2 IMPLANT
CONNECTOR AXIAL ROD (Orthopedic Implant) ×4 IMPLANT
CORDS BIPOLAR (ELECTRODE) ×2 IMPLANT
COVER SURGICAL LIGHT HANDLE (MISCELLANEOUS) ×2 IMPLANT
DIFFUSER DRILL AIR PNEUMATIC (MISCELLANEOUS) ×2 IMPLANT
DRAIN CHANNEL 15F RND FF W/TCR (WOUND CARE) ×2 IMPLANT
DRAIN TLS ROUND 10FR (DRAIN) IMPLANT
DRAPE PROXIMA HALF (DRAPES) ×4 IMPLANT
DRAPE SURG 17X23 STRL (DRAPES) ×6 IMPLANT
DRAPE TABLE COVER HEAVY DUTY (DRAPES) IMPLANT
DRSG MEPILEX BORDER 4X12 (GAUZE/BANDAGES/DRESSINGS) ×2 IMPLANT
DRSG MEPILEX BORDER 4X4 (GAUZE/BANDAGES/DRESSINGS) ×2 IMPLANT
DRSG OPSITE 11X17.75 LRG (GAUZE/BANDAGES/DRESSINGS) ×2 IMPLANT
DRSG PAD ABDOMINAL 8X10 ST (GAUZE/BANDAGES/DRESSINGS) ×2 IMPLANT
DURAPREP 26ML APPLICATOR (WOUND CARE) ×2 IMPLANT
ELECT BLADE 4.0 EZ CLEAN MEGAD (MISCELLANEOUS) ×2
ELECT CAUTERY BLADE 6.4 (BLADE) ×2 IMPLANT
ELECT REM PT RETURN 9FT ADLT (ELECTROSURGICAL) ×2
ELECTRODE BLDE 4.0 EZ CLN MEGD (MISCELLANEOUS) ×1 IMPLANT
ELECTRODE REM PT RTRN 9FT ADLT (ELECTROSURGICAL) ×1 IMPLANT
EVACUATOR 1/8 PVC DRAIN (DRAIN) ×2 IMPLANT
EVACUATOR SILICONE 100CC (DRAIN) ×2 IMPLANT
FILTER STRAW FLUID ASPIR (MISCELLANEOUS) IMPLANT
GAUZE SPONGE 4X4 16PLY XRAY LF (GAUZE/BANDAGES/DRESSINGS) ×2 IMPLANT
GLOVE BIOGEL PI IND STRL 7.5 (GLOVE) ×1 IMPLANT
GLOVE BIOGEL PI IND STRL 9 (GLOVE) ×1 IMPLANT
GLOVE BIOGEL PI INDICATOR 7.5 (GLOVE) ×1
GLOVE BIOGEL PI INDICATOR 9 (GLOVE) ×1
GLOVE SS BIOGEL STRL SZ 7 (GLOVE) ×1 IMPLANT
GLOVE SS BIOGEL STRL SZ 8.5 (GLOVE) ×1 IMPLANT
GLOVE SUPERSENSE BIOGEL SZ 7 (GLOVE) ×1
GLOVE SUPERSENSE BIOGEL SZ 8.5 (GLOVE) ×1
GOWN PREVENTION PLUS XLARGE (GOWN DISPOSABLE) ×4 IMPLANT
GOWN STRL NON-REIN LRG LVL3 (GOWN DISPOSABLE) ×2 IMPLANT
HOOK THORACIC NARROW (Orthopedic Implant) ×8 IMPLANT
IV CATH 14GX2 1/4 (CATHETERS) ×2 IMPLANT
KIT BASIN OR (CUSTOM PROCEDURE TRAY) ×2 IMPLANT
KIT POSITION SURG JACKSON T1 (MISCELLANEOUS) ×2 IMPLANT
KIT ROOM TURNOVER OR (KITS) ×2 IMPLANT
KIT STIMULAN RAPID CURE  10CC (Orthopedic Implant) ×1 IMPLANT
KIT STIMULAN RAPID CURE 10CC (Orthopedic Implant) ×1 IMPLANT
NEEDLE SPNL 22GX3.5 QUINCKE BK (NEEDLE) ×2 IMPLANT
NS IRRIG 1000ML POUR BTL (IV SOLUTION) ×2 IMPLANT
OIL CARTRIDGE MAESTRO DRILL (MISCELLANEOUS) ×2
PACK LAMINECTOMY ORTHO (CUSTOM PROCEDURE TRAY) ×2 IMPLANT
PACK UNIVERSAL I (CUSTOM PROCEDURE TRAY) ×2 IMPLANT
PAD ARMBOARD 7.5X6 YLW CONV (MISCELLANEOUS) ×4 IMPLANT
PATTIES SURGICAL .5 X1 (DISPOSABLE) ×2 IMPLANT
PUTTY BONE BIOATIVE 10CC (Putty) ×2 IMPLANT
ROD STRAIGHT 500MM (Rod) ×2 IMPLANT
ROD TEMPLATE (Rod) ×2 IMPLANT
SCREW SET ATR (Screw) ×8 IMPLANT
SPONGE GAUZE 4X4 12PLY (GAUZE/BANDAGES/DRESSINGS) ×2 IMPLANT
SPONGE NEURO XRAY DETECT 1X3 (DISPOSABLE) IMPLANT
SPONGE SURGIFOAM ABS GEL 100 (HEMOSTASIS) ×2 IMPLANT
STRIP CLOSURE SKIN 1/2X4 (GAUZE/BANDAGES/DRESSINGS) ×4 IMPLANT
SURGIFLO TRUKIT (HEMOSTASIS) ×4 IMPLANT
SUT ETHILON 2 0 FS 18 (SUTURE) ×4 IMPLANT
SUT VIC AB 1 CT1 18XCR BRD 8 (SUTURE) IMPLANT
SUT VIC AB 1 CT1 8-18 (SUTURE)
SUT VIC AB 1 CTX 18 (SUTURE) ×4 IMPLANT
SUT VIC AB 1 CTX 36 (SUTURE)
SUT VIC AB 1 CTX36XBRD ANBCTR (SUTURE) IMPLANT
SUT VIC AB 2-0 CT1 18 (SUTURE) ×2 IMPLANT
SUT VIC AB 2-0 CT1 27 (SUTURE)
SUT VIC AB 2-0 CT1 TAPERPNT 27 (SUTURE) IMPLANT
SUT VIC AB 3-0 X1 27 (SUTURE) ×2 IMPLANT
SYR 3ML LL SCALE MARK (SYRINGE) IMPLANT
SYR BULB IRRIGATION 50ML (SYRINGE) ×2 IMPLANT
SYR CONTROL 10ML LL (SYRINGE) ×4 IMPLANT
SYRINGE 10CC LL (SYRINGE) ×2 IMPLANT
SYSTEM CHEST DRAIN TLS 7FR (DRAIN) IMPLANT
TOWEL OR 17X24 6PK STRL BLUE (TOWEL DISPOSABLE) ×2 IMPLANT
TOWEL OR 17X26 10 PK STRL BLUE (TOWEL DISPOSABLE) ×2 IMPLANT
TRAY FOLEY CATH 14FR (SET/KITS/TRAYS/PACK) ×2 IMPLANT
TUBE SUCT ARGYLE STRL (TUBING) ×2 IMPLANT
WATER STERILE IRR 1000ML POUR (IV SOLUTION) ×8 IMPLANT

## 2011-07-18 NOTE — H&P (View-Only) (Signed)
Abnormal EKG from January 2013 reviewed by [name] PA.  Old records and previous EKGs attached to front of chart. Michael Lutz

## 2011-07-18 NOTE — Anesthesia Postprocedure Evaluation (Signed)
  Anesthesia Post-op Note  Patient: Michael Lutz  Procedure(s) Performed: Procedure(s) (LRB): LAMINECTOMY THORACIC FUSION POSTERIOR (Bilateral)  Patient Location: PACU  Anesthesia Type: General  Level of Consciousness: awake  Airway and Oxygen Therapy: Patient Spontanous Breathing  Post-op Pain: mild  Post-op Assessment: Post-op Vital signs reviewed  Post-op Vital Signs: Reviewed  Complications: No apparent anesthesia complications

## 2011-07-18 NOTE — Interval H&P Note (Signed)
Seen in examined in preop room; No change

## 2011-07-18 NOTE — Transfer of Care (Signed)
Immediate Anesthesia Transfer of Care Note  Patient: Michael Lutz  Procedure(s) Performed: Procedure(s) (LRB): LAMINECTOMY THORACIC FUSION POSTERIOR (Bilateral)  Patient Location: PACU  Anesthesia Type: General  Level of Consciousness: awake, alert  and oriented  Airway & Oxygen Therapy: Patient Spontanous Breathing  Post-op Assessment: Report given to PACU RN  Post vital signs: stable  Complications: No apparent anesthesia complications

## 2011-07-18 NOTE — Brief Op Note (Signed)
Pre-op Dx: Failed prox thoracic fixation  Post-op Dx: same  OPeration:  Revision of prox thoracic fixation and extension of proximal fusion  EBL: 150 CC  Noc omplications

## 2011-07-18 NOTE — Preoperative (Signed)
Beta Blockers   Reason not to administer Beta Blockers:Not Applicable 

## 2011-07-18 NOTE — Progress Notes (Signed)
Seen in [facility] - moves both lower extremities, very drowsy

## 2011-07-18 NOTE — Progress Notes (Signed)
Abnormal EKG from January 2013 reviewed by Allison Zelenak PA.  Old records and previous EKGs attached to front of chart. Lafe Clerk Somers  

## 2011-07-18 NOTE — Anesthesia Procedure Notes (Addendum)
Procedure Name: Intubation Date/Time: 07/18/2011 1:44 PM Performed by: Luster Landsberg Pre-anesthesia Checklist: Patient identified, Emergency Drugs available, Suction available and Patient being monitored Oxygen Delivery Method: Circle system utilized Preoxygenation: Pre-oxygenation with 100% oxygen Intubation Type: IV induction Ventilation: Oral airway inserted - appropriate to patient size Laryngoscope Size: Mac and 4 Grade View: Grade I Tube type: Oral Tube size: 8.0 mm Number of attempts: 1 Airway Equipment and Method: Stylet Placement Confirmation: ETT inserted through vocal cords under direct vision,  positive ETCO2 and breath sounds checked- equal and bilateral Secured at: 24 cm Tube secured with: Tape Dental Injury: Teeth and Oropharynx as per pre-operative assessment  Comments: Gauze bite block placed prior to prone positioning

## 2011-07-18 NOTE — Op Note (Signed)
NAMEMAVERICK, Michael Lutz NO.:  [id number]  MEDICAL RECORD NO.:  [id number]  LOCATION:  5033                         FACILITY:  [facility]  PHYSICIAN:  Nelda Severe, MD      DATE OF BIRTH:  09-Feb-1946  DATE OF PROCEDURE:  07/18/2011 DATE OF DISCHARGE:                              OPERATIVE REPORT   SURGEON:  Nelda Severe, MD  ASSISTANT:  Jolaine Artist, RNFA  PREOPERATIVE DIAGNOSIS:  Status post extension of thoracolumbar fusion to T6, failed proximal fixation (hook pull out).  POSTPROCEDURE DIAGNOSIS:  Status post extension of thoracolumbar fusion to T6, failed proximal fixation (hook pull out).  PROCEDURE:  Revision of proximal thoracic fixation with extension of fusion to T4, down facing laminar hooks bilaterally at T4 and T5, the extension of rod construct from preexisting thoracolumbar instrumentation.  OPERATIVE NOTE:  The patient was placed under general endotracheal anesthesia.  He was positioned prone on a Jackson frame.  Prior to positioning, he had a Foley catheter placed in his bladder and sequential compression devices were placed on both lower extremities. He was given 2 g of Ancef for prophylaxis against infection.  Additionally, prior to positioning, he had neurometric monitoring devices attached to his upper and lower extremities and scalp.  After he was positioned, a care was taken to position his upper extremities so as to avoid hyperflexion and abduction of the shoulders and so as to avoid hyperflexion of the elbows.  Upper extremities were padded with foam from axilla to hands.  Thighs, knees, shins, and ankles were supported on pillows.  The upper portion of his previous thoracolumbar incision was marked with a marking pen and the line of the incision run proximally to above the T3 level.  The thoracolumbar spine was prepped with DuraPrep and draped in a rectangular fashion.  The drapes were secured with Ioban.  A time-out was held, the  usual information was confirmed/discussed.  The skin was scored with knife in line with the incision.  Subcutaneous tissue was then injected with a mixture of 0.25% plain Marcaine and 1% lidocaine with epinephrine.  Cutting current was then used to deepen the dissection down to the spinous processes proximally and the rods more distally in the wound.  The hooks had actually fractured and pull off the lamina underlying.  Once the upper approximately 6-8 cm of the rods had been dissected, clear of soft tissue, the hooks were undone and removed.  We then first on the left side, prepared the upper lamina of T4 and of T5 for placement of down-facing thoracic laminar hook.  We then used in situ Benders to bend the rod extending from the previously placed thoracolumbar fixation into little more kyphosis.  It was then spliced with an end-to-end connector to a 70-mm segment of rod, which was placed in the hooks proximally.  All screws were provisionally tightened.  I then moved to the patient's right side and exposed the lamina more so than I had already done and cleaned them of soft tissue.  Again, we prepared the upper lamina of T4 and T5 for placement of down-facing hooks.  I had to remove some of the thoracolumbar rod  in order to allow room for placement of the end-to-end connector.  This was done with a bone cutter.  This rod was also contoured into a little more kyphosis. An end-to-end connector was then used to attach to another 70-mm segment which was seated in the hooks.  All screws were provisionally tightened.  We then compressed the construct and made sure that the both distal and proximal rods were well seated in the end-to-end connectors and torqued the set screws in the end-to-end connectors.  The proximal hooks were then compressed onto the rod and torqued in position.  This was done on both sides.  All screws were doubly torqued.  I then used locally harvested laminectomy  bone mixed with NovaBone (Bioglass) as a graft material and placed it between the lamina of T4, T5, and T6.  A #15-gauge Blake drain was placed subfascially and brought out through the skin to the right side and secured with a 2-0 nylon suture.  The thoracolumbar fascia and paraspinal muscle were then closed using interrupted vertical mattress sutures of #1 Vicryl in order to try to eliminate dead space as well as closing the fascia.  An eighth-inch Hemovac drain was then placed in the subcutaneous layer.  The subcutaneous layer was closed using interrupted 0 Vicryl sutures in inverted fashion.  The skin was then closed using continuous 2-0 nylon suture in running mattress fashion.  An antibiotic ointment was applied to the wound and the wounds were dressed with Mepilex.  Estimated blood loss was 150 mL.  There were no intraoperative complications.  At time of dictation, the patient has not yet been transferred to recovery room and no neurologic examination has been performed at this time.  However, neurometric monitoring throughout the case revealed no decrease in amplitude of his SSEPs.     Nelda Severe, MD     MT/MEDQ  D:  07/18/2011  T:  07/18/2011  Job:  [id number]

## 2011-07-18 NOTE — Progress Notes (Signed)
Patient requests that gloves be worn for all patient interaction. Michael Lutz

## 2011-07-18 NOTE — Anesthesia Preprocedure Evaluation (Signed)
Anesthesia Evaluation  Patient identified by MRN, date of birth, ID band Patient awake    Reviewed: Allergy & Precautions, H&P , NPO status , Patient's Chart, lab work & pertinent test results  Airway Mallampati: I TM Distance: >3 FB Neck ROM: Full    Dental  (+) Edentulous Upper, Edentulous Lower and Dental Advisory Given   Pulmonary  breath sounds clear to auscultation        Cardiovascular Rhythm:Regular Rate:Normal     Neuro/Psych    GI/Hepatic   Endo/Other  Morbid obesity  Renal/GU      Musculoskeletal   Abdominal   Peds  Hematology   Anesthesia Other Findings   Reproductive/Obstetrics                           Anesthesia Physical Anesthesia Plan  ASA: III  Anesthesia Plan: General   Post-op Pain Management:    Induction: Intravenous  Airway Management Planned: Oral ETT  Additional Equipment:   Intra-op Plan:   Post-operative Plan: Extubation in OR  Informed Consent: I have reviewed the patients History and Physical, chart, labs and discussed the procedure including the risks, benefits and alternatives for the proposed anesthesia with the patient or authorized representative who has indicated his/her understanding and acceptance.   Dental advisory given  Plan Discussed with: CRNA, Anesthesiologist and Surgeon  Anesthesia Plan Comments:         Anesthesia Quick Evaluation

## 2011-07-19 LAB — BASIC METABOLIC PANEL
BUN: 16 mg/dL (ref 6–23)
CO2: 26 mEq/L (ref 19–32)
Calcium: 8.5 mg/dL (ref 8.4–10.5)
Chloride: 105 mEq/L (ref 96–112)
Creatinine, Ser: 0.89 mg/dL (ref 0.50–1.35)
GFR calc Af Amer: >90 mL/min (ref >90)
GFR calc non Af Amer: 88 mL/min — ABNORMAL LOW (ref >90)
Glucose, Bld: 144 mg/dL — ABNORMAL HIGH (ref 70–99)
Potassium: 3.7 mEq/L (ref 3.5–5.1)
Sodium: 137 mEq/L (ref 135–145)

## 2011-07-19 LAB — CBC
HCT: 32.3 % — ABNORMAL LOW (ref 39.0–52.0)
Hemoglobin: 9.9 g/dL — ABNORMAL LOW (ref 13.0–17.0)
MCH: 22.6 pg — ABNORMAL LOW (ref 26.0–34.0)
MCHC: 30.7 g/dL (ref 30.0–36.0)
MCV: 73.6 fL — ABNORMAL LOW (ref 78.0–100.0)
Platelets: 146 10*3/uL — ABNORMAL LOW (ref 150–400)
RBC: 4.39 MIL/uL (ref 4.22–5.81)
RDW: 18.5 % — ABNORMAL HIGH (ref 11.5–15.5)
WBC: 8.6 10*3/uL (ref 4.0–10.5)

## 2011-07-19 MED ORDER — OXYCODONE HCL 5 MG PO TABS
30.0000 mg | ORAL_TABLET | ORAL | Status: DC
  Administered 2011-07-19: 30 mg via ORAL
  Administered 2011-07-19: 20 mg via ORAL
  Administered 2011-07-19 – 2011-07-21 (×10): 30 mg via ORAL
  Filled 2011-07-19 (×5): qty 6
  Filled 2011-07-19: qty 3
  Filled 2011-07-19 (×4): qty 6
  Filled 2011-07-19: qty 4

## 2011-07-19 MED ORDER — VENLAFAXINE HCL ER 75 MG PO CP24
225.0000 mg | ORAL_CAPSULE | Freq: Every day | ORAL | Status: DC
  Administered 2011-07-20 – 2011-07-21 (×2): 225 mg via ORAL
  Filled 2011-07-19 (×2): qty 1

## 2011-07-19 MED ORDER — HYDROCODONE-ACETAMINOPHEN 10-325 MG PO TABS
1.0000 | ORAL_TABLET | ORAL | Status: DC | PRN
  Administered 2011-07-19 – 2011-07-20 (×4): 1 via ORAL
  Filled 2011-07-19 (×4): qty 1

## 2011-07-19 MED FILL — Hydromorphone HCl Inj 1 MG/ML: INTRAMUSCULAR | Qty: 1 | Status: AC

## 2011-07-19 NOTE — Progress Notes (Signed)
Occupational Therapy Evaluation Patient Details Name: Michael Lutz MRN: [id number] DOB: 03/21/1945 Today's Date: 07/19/2011 Time: 1610-1630 OT Time Calculation (min): 20 min  OT Assessment / Plan / Recommendation Clinical Impression  66 yo s/p thoracolumbar fusion. Pt states that he has everything he needs and "knows everything he needs to know". Educated pt on availability of toilet aid to assist with hygiene after toileting, given pt's difficualty with hygiene. Also educated pt n availability of leg lifter to assist with ADL. Pt appreciated information. Pt states he is going home tomorrow and will have his nephew to assist as needed. No further OT needs.    OT Assessment  Patient does not need any further OT services    Follow Up Recommendations  No OT follow up    Equipment Recommendations  Other (comment) (toilet aid, leg lifter)    Frequency      Precautions / Restrictions Precautions Precautions: Back Required Braces or Orthoses: Spinal Brace;Other Brace/Splint (TLSO) Spinal Brace: Thoracolumbosacral orthotic   Pertinent Vitals/Pain 5    ADL  Eating/Feeding: Performed;Independent Grooming: Performed;Set up Where Assessed - Grooming: Supine, head of bed up Upper Body Bathing: Simulated;Set up Lower Body Bathing: Simulated;Minimal assistance Upper Body Dressing: Simulated;Set up Lower Body Dressing: Simulated;Minimal assistance Toilet Transfer: Not assessed Toileting - Hygiene: Moderate assistance;Performed Tub/Shower Transfer: Not assessed Equipment Used: Gait belt;Leg lifter;Other (comment) (toilet aid) ADL Comments: Pt declined to get OOB this pm.  Pt indep with donning/doffing TLSO. "I've been doing it for 18 months".  OT Goals Acute Rehab OT Goals OT Goal Formulation:  (eval only)  Visit Information  Last OT Received On: 07/19/11    Subjective Data   I'm going home tomorrow.   Prior Functioning  Home Living Lives With: Family (nephew) Available Help at  Discharge: Available 24 hours/day;Family Type of Home: House Home Access: Stairs to enter [employer] of Steps: 3 Entrance Stairs-Rails: Left Home Layout: One level Bathroom Shower/Tub: Tub/shower unit;Curtain Bathroom Toilet: Handicapped height Bathroom Accessibility: Yes How Accessible: Accessible via walker Home Adaptive Equipment: Walker - rolling;Straight cane;Shower chair with back;Reacher;Sock aid Additional Comments: nephew assist patient with ADLs Prior Function Level of Independence: Independent with assistive device(s) Able to Take Stairs?: Yes Driving: Yes Vocation: On disability Communication Communication: No difficulties Dominant Hand: Left    Cognition  Overall Cognitive Status: Appears within functional limits for tasks assessed/performed Arousal/Alertness: Awake/alert Orientation Level: Oriented X4 / Intact Behavior During Session: [facility] for tasks performed    Extremity/Trunk Assessment Right Upper Extremity Assessment RUE ROM/Strength/Tone: Within functional levels Left Upper Extremity Assessment LUE ROM/Strength/Tone: Within functional levels   Mobility             End of Session OT - End of Session Activity Tolerance: Patient tolerated treatment well Patient left: in bed;with call bell/phone within reach   [facility] 07/19/2011, 4:33 PM [facility], OTR/L  [phone] 07/19/2011

## 2011-07-19 NOTE — Progress Notes (Signed)
Patient very non-compliant with plan of care overnight.  Patient continues to disregard back  Precautions repeatedly even though instructed, twisting around in the bed, resulting in hemovac to be dislodged at approximately midnight 07/19/11.  Patient very demanding of extra oral pain medication that was not ordered on post-op arrival, even though he is on a full dose Dilaudid PCA.  I spoke with Dr. Alveda Reasons regarding resuming his Oxycodone IR, patient takes 30mg  q4h PRN at home.  Dr. Alveda Reasons placed patient on Oxycodone 10mg  q4h PRN, and discontinued his Vicodin that was ordered post-op, because he would not allow him to be taking Oxycodone IR and Vicodin while on the PCA.  Patient is aware of both of these things, however angry over the dosage of Oxycodone IR, becoming belligerent and swearing about his pain management.  Explained to patient the potency of having a Dilaudid PCA in comparison to Oxycodone IR which he is taking at home, with mild comprehension.  Patient also insisting that all staff wear gloves when in his room and touching him, as he insistently believes that he contracted scabies as a result of a past hospital admission within the past year.

## 2011-07-19 NOTE — Progress Notes (Signed)
Physical Therapy Evaluation Note  Past Medical History  Diagnosis Date  . Heart murmur     arrythmia  . AAA (abdominal aortic aneurysm)   . Hyperlipidemia     takes Niacin daily and Lipitor daily  . Joint pain   . Leg pain 05-04-10    with walking  . History of shingles   . Peripheral vascular disease     s/p AAA stent  . Dysrhythmia     palpitations- sees dr Eldridge Dace  . ADD (attention deficit disorder)   . Bipolar affective disorder     in remission on meds  . Chronic back pain     [facility]  . Colon polyps     Dr Matthias Hughs does a colonoscopy every 5 years  . Hypertension     takes Amlodipine and Quinapril daily  . Peripheral edema     takes Furosemide prn  . Hx of seasonal allergies   . Arthritis     left knee  . Chronic back pain     scoliosis  . Hx of scabies Mid Feb 2013  . GERD (gastroesophageal reflux disease)     takes Zantac daily  . Constipation     takes Colace prn  . Urinary urgency   . Nocturia   . Enlarged prostate     slightly  . Blood transfusion     several times  . Bipolar 1 disorder     takes Seroquel nightly  . Depression     takes Effexor daily  . Insomnia     takes Trazodone nightly  . Anxiety     takes Clonazepam daily  . Memory loss     short and long term    Past Surgical History  Procedure Date  . Spine surgery 12/2009    Spinal Fusion by Dr. Alveda Reasons  . Abdominal aortic aneurysm repair 11/2009    Stent graft repair  . Foot surgery     Multiple surgeries on right foot  . Shoulder surgery     Right shoulder  . Tonsillectomy   . Testicle surgery     right testicle  . Turp vaporization   . Cholecystectomy   . Back surgery     thoracic spine  . Spinal cord stimulator implant 2009  . Vascular surgery     AAA stent repair  . Cardiac catheterization     15 yrs ago  . Spinal fusion 04/04/2011    Procedure: FUSION POSTERIOR SPINAL MULTILEVEL/SCOLIOSIS;  Surgeon: Charlsie Quest, MD;  Location: [facility] OR;   Service: Orthopedics;  Laterality: N/A;  repair thoracic and lumbar  pseudoarthrosis, revise spinal cord stimulator, extend thoracic fusion, iliac crest bone graft  . Colonoscopy      07/19/11 0820  PT Visit Information  Last PT Received On 07/19/11  Assistance Needed +2  PT Time Calculation  PT Start Time 0820  PT Stop Time 0855  PT Time Calculation (min) 35 min  Subjective Data  Subjective Pt received supine in bed with HOB at 50 degrees.  Precautions  Precautions Back  Precaution Booklet Issued Yes (comment)  Precaution Comments pt reports "I've had many surgeries, I know them" However patient actively non-comliant  Required Braces or Orthoses Spinal Brace  Spinal Brace TLSO  Restrictions  Weight Bearing Restrictions No  Home Living  Lives With Family (nephew)  Available Help at Discharge Available 24 hours/day;Family  Type of Home House  Home Access Stairs to enter  Entrance Stairs-Number of Steps  3  Entrance Stairs-Rails Left  Home Layout One level  Bathroom Shower/Tub Tub/shower unit;Curtain  Bathroom Toilet Handicapped height  Bathroom Accessibility Yes  How Accessible Accessible via walker  Home Adaptive Equipment Walker - rolling;Straight cane;Shower chair with back;Reacher;Sock aid  Additional Comments nephew assist patient with ADLs  Prior Function  Level of Independence Independent with assistive device(s)  Able to Take Stairs? Yes  Driving Yes  Vocation On disability  Communication  Communication No difficulties  Cognition  Overall Cognitive Status Appears within functional limits for tasks assessed/performed  Arousal/Alertness Lethargic  Orientation Level Oriented X4 / Intact  Behavior During Session [facility] for tasks performed  Right Upper Extremity Assessment  RUE ROM/Strength/Tone WFL  Left Upper Extremity Assessment  LUE ROM/Strength/Tone WFL  Right Lower Extremity Assessment  RLE ROM/Strength/Tone WFL  Left Lower Extremity Assessment  LLE  ROM/Strength/Tone WFL  Trunk Assessment  Trunk Assessment (TLSO donned limiting trunk mobilty)  Bed Mobility  Bed Mobility Rolling Left;Left Sidelying to Sit  Rolling Left 1: +2 Total assist  Rolling Left: Patient Percentage 50%  Left Sidelying to Sit 2: Max assist;With rails;HOB elevated (25 deg)  Details for Bed Mobility Assistance v/c's for technique, assist for trunk elevation and to bring LEs off EOB  Transfers  Transfers Sit to Stand  Sit to Stand 3: Mod assist;With upper extremity assist;From bed  Stand Pivot Transfers 1: +2 Total assist  Stand Pivot Transfers: Patient Percentage 60%  Details for Transfer Assistance pt groggy and unsteady upon stand, assist required for walker managment and verbal directional cues required. Patient required v/c's to maintain eye opening. Patient consistently tried arching his back to "stretch" despite max v/c's not too.  Ambulation/Gait  Ambulation/Gait Assistance Not tested (comment) (pt defferred ambulation at this time due to increased pain)  Balance  Balance Assessed Yes  Static Standing Balance  Static Standing - Level of Assistance 4: Min assist  Static Standing - Comment/# of Minutes pt stood x 3 min while TLSO adjusted for proper fitting  Static Standing - Balance Support Bilateral upper extremity supported  PT - End of Session  Equipment Utilized During Treatment Gait belt;Back brace (TLSO donned in sitting, re-fitting in standing, pt refused to don brace in supine and reports Dr. Alveda Reasons to know he usually puts it on in standing.)  Activity Tolerance Patient limited by pain  Patient left in chair;with call bell/phone within reach  Nurse Communication Mobility status  PT Assessment  Clinical Impression Statement Pt s/p thoracic posterior fusion presenting with increasd back pain however very letheragic and groggy with difficulty staying awake during PT session. Patient non-compliant with back precautions frequently arching and twisting.  patient reports nephew to be his primary caregiver and available 24/7. Patient to benefit from skilled PT to maximize I function to decrease dependence on caregiver and increase I function.  PT Recommendation/Assessment Patient needs continued PT services  PT Problem List Decreased strength;Decreased activity tolerance;Decreased balance;Decreased mobility  PT Therapy Diagnosis  Difficulty walking;Abnormality of gait;Generalized weakness;Acute pain  PT Plan  PT Frequency Min 5X/week  PT Treatment/Interventions DME instruction;Gait training;Stair training;Functional mobility training;Therapeutic activities;Therapeutic exercise  PT Recommendation  Follow Up Recommendations Home health PT;Supervision/Assistance - 24 hour  Equipment Recommended (pt has all recommended equip)  Individuals Consulted  Consulted and Agree with Results and Recommendations Patient  Acute Rehab PT Goals  PT Goal Formulation With patient  Time For Goal Achievement 07/26/11  Potential to Achieve Goals Good  Pt will Roll Supine to Left Side with modified independence  PT Goal: Rolling Supine to Left Side - Progress Goal set today  Pt will go Sit to Supine/Side with modified independence;with HOB 0 degrees  PT Goal: Sit to Supine/Side - Progress Goal set today  Pt will go Sit to Stand with modified independence;with upper extremity assist  PT Goal: Sit to Stand - Progress Goal set today  Pt will Ambulate 51 - 150 feet;with modified independence;with rolling walker (with TLSO)  PT Goal: Ambulate - Progress Goal set today  Pt will Go Up / Down Stairs 3-5 stairs;with supervision;with rail(s)  PT Goal: Up/Down Stairs - Progress Goal set today  Additional Goals  Additional Goal #1 Pt 100% compliant with back precautions.  PT Goal: Additional Goal #1 - Progress Goal set today  Written Expression  Dominant Hand Left     Pain: 9/10, pt pressed PCA pump x 2 during PT session  Lewis Shock, PT, DPT Pager #:  [phone] Office #: [phone]

## 2011-07-19 NOTE — Progress Notes (Signed)
UR COMPLETED  

## 2011-07-19 NOTE — Progress Notes (Signed)
Seen in room 5033  Vital signs stable, afebrile  S: "when can I go home?"  Wants to discontinue PCA and go back on oal meds.  Says his regular Effexor dose is 225 mg daily  O: sitting comfortably in bed working on his computer  A: stable  P:  Discontinue PCA, start usual home diose of oxycodone and Norco  Poss. Discharge tomorrow

## 2011-07-20 ENCOUNTER — Encounter (HOSPITAL_COMMUNITY): Payer: Self-pay | Admitting: General Practice

## 2011-07-20 MED ORDER — OXYCODONE HCL 5 MG PO TABS
30.0000 mg | ORAL_TABLET | ORAL | Status: DC | PRN
  Administered 2011-07-21 (×2): 30 mg via ORAL
  Filled 2011-07-20 (×3): qty 6

## 2011-07-20 MED ORDER — HYDROCODONE-ACETAMINOPHEN 10-325 MG PO TABS
1.0000 | ORAL_TABLET | Freq: Four times a day (QID) | ORAL | Status: DC
  Administered 2011-07-20 – 2011-07-21 (×5): 1 via ORAL
  Filled 2011-07-20 (×5): qty 1

## 2011-07-20 MED ORDER — PANTOPRAZOLE SODIUM 40 MG PO TBEC
40.0000 mg | DELAYED_RELEASE_TABLET | Freq: Every day | ORAL | Status: DC
  Administered 2011-07-20: 40 mg via ORAL
  Filled 2011-07-20: qty 1

## 2011-07-20 MED ORDER — BISACODYL 10 MG RE SUPP
10.0000 mg | Freq: Every day | RECTAL | Status: DC | PRN
  Administered 2011-07-20: 10 mg via RECTAL
  Filled 2011-07-20: qty 1

## 2011-07-20 MED ORDER — ALUM & MAG HYDROXIDE-SIMETH 200-200-20 MG/5ML PO SUSP: 30.0000 mL | Freq: Four times a day (QID) | ORAL | Status: DC | PRN

## 2011-07-20 MED ORDER — LISINOPRIL 40 MG PO TABS
80.0000 mg | ORAL_TABLET | Freq: Every day | ORAL | Status: DC
  Administered 2011-07-21: 80 mg via ORAL
  Filled 2011-07-20 (×2): qty 2

## 2011-07-20 NOTE — Progress Notes (Signed)
Physical Therapy Treatment Patient Details Name: Michael Lutz MRN: [id number] DOB: Jul 26, 1945 Today's Date: 07/20/2011 Time: 8295-6213 PT Time Calculation (min): 33 min  PT Assessment / Plan / Recommendation Comments on Treatment Session  Pt continues to require cues to adhere to back precautions during mobility tasks.  Able to increase ambulation distance today.    Follow Up Recommendations  Home health PT;Supervision/Assistance - 24 hour    Barriers to Discharge        Equipment Recommendations       Recommendations for Other Services    Frequency Min 5X/week   Plan Discharge plan remains appropriate;Frequency remains appropriate    Precautions / Restrictions Precautions Precautions: Back Precaution Comments: Needs cues to adhere to precautions Required Braces or Orthoses: Spinal Brace Spinal Brace: Thoracolumbosacral orthotic Restrictions Weight Bearing Restrictions: No   Pertinent Vitals/Pain Minimal c/o LE pain during ambulation.    Mobility  Transfers Transfers: Sit to Stand;Stand to Sit Sit to Stand: 4: Min assist;With upper extremity assist;With armrests;From chair/3-in-1 Stand to Sit: 4: Min assist;With upper extremity assist;With armrests;To chair/3-in-1 Details for Transfer Assistance: Cues for hand placement and safety.  Pt tries to pull up on RW.  Continues to arch his back to "stretch" upon initial standing despite cues. Ambulation/Gait Ambulation/Gait Assistance: 4: Min assist Ambulation Distance (Feet): 140 Feet Assistive device: Rolling walker Ambulation/Gait Assistance Details: Cues for upright trunk and to stay close to RW.  Adjusted height of RW to hopefully allow more upright posture. Gait Pattern: Step-through pattern;Wide base of support;Trunk flexed Stairs: No Wheelchair Mobility Wheelchair Mobility: No      PT Goals Acute Rehab PT Goals Time For Goal Achievement: 07/26/11 Potential to Achieve Goals: Good PT Goal: Sit to Stand - Progress:  Progressing toward goal PT Goal: Ambulate - Progress: Progressing toward goal Additional Goals PT Goal: Additional Goal #1 - Progress: Progressing toward goal  Visit Information  Last PT Received On: 07/20/11 Assistance Needed: +1          Cognition  Overall Cognitive Status: Appears within functional limits for tasks assessed/performed Arousal/Alertness: Awake/alert Orientation Level: Oriented X4 / Intact Behavior During Session: [facility] for tasks performed    Balance  Balance Balance Assessed: Yes Static Standing Balance Static Standing - Balance Support: Bilateral upper extremity supported Static Standing - Level of Assistance: 4: Min assist;Other (comment) (guard assist) Static Standing - Comment/# of Minutes: 3  End of Session PT - End of Session Equipment Utilized During Treatment: Gait belt;Back brace Activity Tolerance: Patient tolerated treatment well Patient left: in chair;with call bell/phone within reach Nurse Communication: Mobility status    Newell Coral 07/20/2011, 12:30 PM  Newell Coral, PTA Acute Rehab [phone] (office)

## 2011-07-20 NOTE — Progress Notes (Signed)
Vital signs stable, afebrile  S: no flatus, not getting pain meds as he wants them  O: abdomen bloated, active bowel sounds, nontender  A: ileus:  Suppository  P: altered pain meds and dosages, PT

## 2011-07-20 NOTE — Progress Notes (Signed)
Referral received for SNF. Chart reviewed and CSW has spoken with RNCM who indicates that patient is for DC to home with Home Health and DME.  CSW to sign off. Please re-consult if CSW needs arise.  Belicia Difatta T. Tej Murdaugh, BSW  [phone]  

## 2011-07-21 ENCOUNTER — Inpatient Hospital Stay (HOSPITAL_COMMUNITY): Payer: Medicare Other

## 2011-07-21 DIAGNOSIS — Z981 Arthrodesis status: Secondary | ICD-10-CM | POA: Diagnosis not present

## 2011-07-21 NOTE — Progress Notes (Signed)
Seen in room 5033  Vital signs stable, Tmax 100.1  S: wants to go home, more incisional pain than yeaterday  O: incision looks fine, no drainage, no significant erythema  A: stable  P: x-ray spine to check position of implants, discharge later.

## 2011-07-21 NOTE — Progress Notes (Signed)
Physical Therapy Note   07/21/11 1200  PT Visit Information  Last PT Received On 07/21/11  Assistance Needed +1  PT Time Calculation  PT Start Time 1120  PT Stop Time 1140  PT Time Calculation (min) 20 min  Subjective Data  Subjective I think there must be something wrong because my last back surgery didn't hurt this bad.    Precautions  Precautions Back  Precaution Comments Needs cues to adhere to precautions  Required Braces or Orthoses Spinal Brace  Spinal Brace TLSO  Restrictions  Weight Bearing Restrictions No  Cognition  Overall Cognitive Status Appears within functional limits for tasks assessed/performed  Arousal/Alertness Awake/alert  Orientation Level Oriented X4 / Intact  Behavior During Session [facility] for tasks performed  Bed Mobility  Bed Mobility Not assessed  Transfers  Transfers Sit to Stand;Stand to Sit  Sit to Stand 4: Min guard;With upper extremity assist;With armrests;From chair/3-in-1  Stand to Sit 4: Min guard;With upper extremity assist;With armrests;To chair/3-in-1  Details for Transfer Assistance pt needs cues for back precautions  Ambulation/Gait  Ambulation/Gait Assistance 4: Min guard  Ambulation Distance (Feet) 120 Feet (x2)  Assistive device Rolling walker  Ambulation/Gait Assistance Details cues to stay closer to RW, upright posture, back precautions.    Gait Pattern Step-through pattern;Wide base of support;Trunk flexed  Stairs Yes  Stairs Assistance 4: Min guard  Stair Management Technique Two rails;Forwards  Number of Stairs 2   Engineering geologist No  Balance  Balance Assessed No  PT - End of Session  Equipment Utilized During Treatment Gait belt;Back brace  Activity Tolerance Patient tolerated treatment well  Patient left in chair;with call bell/phone within reach  Nurse Communication Mobility status  PT - Assessment/Plan  Comments on Treatment Session pt presents s/p thoracolumbar fusion.  pt needs cueing for  back precautions and safety.  pt notes painful today, however improving mobility.    PT Plan Discharge plan remains appropriate;Frequency remains appropriate  PT Frequency Min 5X/week  Follow Up Recommendations Home health PT;Supervision/Assistance - 24 hour  Equipment Recommended None recommended by PT  Acute Rehab PT Goals  PT Goal: Sit to Stand - Progress Progressing toward goal  PT Goal: Ambulate - Progress Progressing toward goal  PT Goal: Up/Down Stairs - Progress Progressing toward goal  Additional Goals  PT Goal: Additional Goal #1 - Progress Progressing toward goal    Mack Hook, PT [phone]

## 2011-07-21 NOTE — Discharge Summary (Signed)
Discharge diagnosis: Status post thoracolumbar fusion with failed proximal fixation  This man was admitted to the hospital for management of failed proximal thoracic fixation. The day of admission he was taken the operating room where his fusion was extended proximally from T6-T4 and new laminar clips were placed bilaterally at T4-5 and the thoracolumbar rods extend proximally. Local autograft and bile glass were used as a fusion material. Antibiotic loaded calcium sulfate beads were placed in the wound. Postoperatively there were no complications. He returned to his usual oral routine of pain medication, which is managed by Dr. Thyra Breed the [facility]. At this time he does not require any discharge prescriptions.  He is ambulating and using his thoracolumbosacral orthosis. Wound is stable without drainage. Radiographs of lumbosacral and thoracic spines were reviewed today. The radiologist noted "loosening" of the bilateral sacral screws, but on my review of the images, they did not appear to be loose to me. The upper thoracic fixation, the area which was operated on most recently, looks fine.  He'll return to the office for followup in approximately 2 weeks' time when his sutures will be removed.  Condition at discharge: Wound stable, ambulatory

## 2011-07-27 DIAGNOSIS — G8918 Other acute postprocedural pain: Secondary | ICD-10-CM | POA: Diagnosis not present

## 2011-07-27 DIAGNOSIS — IMO0002 Reserved for concepts with insufficient information to code with codable children: Secondary | ICD-10-CM | POA: Diagnosis not present

## 2011-07-27 DIAGNOSIS — M961 Postlaminectomy syndrome, not elsewhere classified: Secondary | ICD-10-CM | POA: Diagnosis not present

## 2011-07-28 ENCOUNTER — Emergency Department (HOSPITAL_COMMUNITY)
Admission: EM | Admit: 2011-07-28 | Discharge: 2011-07-28 | Disposition: A | Discharge status: Home / Self Care | Payer: Medicare Other | Attending: Emergency Medicine | Admitting: Emergency Medicine

## 2011-07-28 ENCOUNTER — Emergency Department (HOSPITAL_COMMUNITY): Payer: Medicare Other

## 2011-07-28 ENCOUNTER — Encounter (HOSPITAL_COMMUNITY): Payer: Self-pay

## 2011-07-28 DIAGNOSIS — R079 Chest pain, unspecified: Secondary | ICD-10-CM | POA: Diagnosis not present

## 2011-07-28 DIAGNOSIS — R269 Unspecified abnormalities of gait and mobility: Secondary | ICD-10-CM | POA: Insufficient documentation

## 2011-07-28 DIAGNOSIS — F319 Bipolar disorder, unspecified: Secondary | ICD-10-CM | POA: Insufficient documentation

## 2011-07-28 DIAGNOSIS — R0602 Shortness of breath: Secondary | ICD-10-CM | POA: Diagnosis not present

## 2011-07-28 DIAGNOSIS — I10 Essential (primary) hypertension: Secondary | ICD-10-CM | POA: Insufficient documentation

## 2011-07-28 DIAGNOSIS — R918 Other nonspecific abnormal finding of lung field: Secondary | ICD-10-CM | POA: Diagnosis not present

## 2011-07-28 DIAGNOSIS — E785 Hyperlipidemia, unspecified: Secondary | ICD-10-CM | POA: Diagnosis not present

## 2011-07-28 DIAGNOSIS — Z7982 Long term (current) use of aspirin: Secondary | ICD-10-CM | POA: Insufficient documentation

## 2011-07-28 DIAGNOSIS — M1710 Unilateral primary osteoarthritis, unspecified knee: Secondary | ICD-10-CM | POA: Insufficient documentation

## 2011-07-28 DIAGNOSIS — I739 Peripheral vascular disease, unspecified: Secondary | ICD-10-CM | POA: Insufficient documentation

## 2011-07-28 DIAGNOSIS — G8918 Other acute postprocedural pain: Secondary | ICD-10-CM | POA: Diagnosis not present

## 2011-07-28 DIAGNOSIS — M545 Low back pain, unspecified: Secondary | ICD-10-CM | POA: Diagnosis not present

## 2011-07-28 DIAGNOSIS — F411 Generalized anxiety disorder: Secondary | ICD-10-CM | POA: Insufficient documentation

## 2011-07-28 DIAGNOSIS — Z9889 Other specified postprocedural states: Secondary | ICD-10-CM

## 2011-07-28 DIAGNOSIS — G4700 Insomnia, unspecified: Secondary | ICD-10-CM | POA: Diagnosis not present

## 2011-07-28 DIAGNOSIS — R109 Unspecified abdominal pain: Secondary | ICD-10-CM | POA: Diagnosis not present

## 2011-07-28 DIAGNOSIS — R011 Cardiac murmur, unspecified: Secondary | ICD-10-CM | POA: Insufficient documentation

## 2011-07-28 DIAGNOSIS — Z79899 Other long term (current) drug therapy: Secondary | ICD-10-CM | POA: Diagnosis not present

## 2011-07-28 DIAGNOSIS — M546 Pain in thoracic spine: Secondary | ICD-10-CM | POA: Insufficient documentation

## 2011-07-28 DIAGNOSIS — Z981 Arthrodesis status: Secondary | ICD-10-CM | POA: Insufficient documentation

## 2011-07-28 DIAGNOSIS — F17200 Nicotine dependence, unspecified, uncomplicated: Secondary | ICD-10-CM | POA: Diagnosis not present

## 2011-07-28 DIAGNOSIS — M4120 Other idiopathic scoliosis, site unspecified: Secondary | ICD-10-CM | POA: Diagnosis not present

## 2011-07-28 DIAGNOSIS — R002 Palpitations: Secondary | ICD-10-CM | POA: Insufficient documentation

## 2011-07-28 DIAGNOSIS — M549 Dorsalgia, unspecified: Secondary | ICD-10-CM | POA: Diagnosis not present

## 2011-07-28 DIAGNOSIS — M25519 Pain in unspecified shoulder: Secondary | ICD-10-CM | POA: Diagnosis not present

## 2011-07-28 DIAGNOSIS — R6889 Other general symptoms and signs: Secondary | ICD-10-CM | POA: Diagnosis not present

## 2011-07-28 DIAGNOSIS — R799 Abnormal finding of blood chemistry, unspecified: Secondary | ICD-10-CM | POA: Diagnosis not present

## 2011-07-28 LAB — BASIC METABOLIC PANEL
BUN: 18 mg/dL (ref 6–23)
CO2: 23 mEq/L (ref 19–32)
Calcium: 9.3 mg/dL (ref 8.4–10.5)
Chloride: 102 mEq/L (ref 96–112)
Creatinine, Ser: 0.71 mg/dL (ref 0.50–1.35)
GFR calc Af Amer: >90 mL/min (ref >90)
GFR calc non Af Amer: >90 mL/min (ref >90)
Glucose, Bld: 93 mg/dL (ref 70–99)
Potassium: 4.1 mEq/L (ref 3.5–5.1)
Sodium: 137 mEq/L (ref 135–145)

## 2011-07-28 LAB — DIFFERENTIAL
Basophils Absolute: 0.1 10*3/uL (ref 0.0–0.1)
Basophils Relative: 1 % (ref 0–1)
Eosinophils Absolute: 0.5 10*3/uL (ref 0.0–0.7)
Eosinophils Relative: 5 % (ref 0–5)
Lymphocytes Relative: 20 % (ref 12–46)
Lymphs Abs: 2 10*3/uL (ref 0.7–4.0)
Monocytes Absolute: 0.5 10*3/uL (ref 0.1–1.0)
Monocytes Relative: 5 % (ref 3–12)
Neutro Abs: 7.3 10*3/uL (ref 1.7–7.7)
Neutrophils Relative %: 71 % (ref 43–77)

## 2011-07-28 LAB — CBC
HCT: 37 % — ABNORMAL LOW (ref 39.0–52.0)
Hemoglobin: 11.6 g/dL — ABNORMAL LOW (ref 13.0–17.0)
MCH: 22.7 pg — ABNORMAL LOW (ref 26.0–34.0)
MCHC: 31.4 g/dL (ref 30.0–36.0)
MCV: 72.5 fL — ABNORMAL LOW (ref 78.0–100.0)
Platelets: 248 10*3/uL (ref 150–400)
RBC: 5.1 MIL/uL (ref 4.22–5.81)
RDW: 19.2 % — ABNORMAL HIGH (ref 11.5–15.5)
WBC: 10.3 10*3/uL (ref 4.0–10.5)

## 2011-07-28 LAB — D-DIMER, QUANTITATIVE: D-Dimer, Quant: 2.87 ug/mL-FEU — ABNORMAL HIGH (ref 0.00–0.48)

## 2011-07-28 MED ORDER — HYDROMORPHONE HCL PF 1 MG/ML IJ SOLN
1.0000 mg | Freq: Once | INTRAMUSCULAR | Status: AC
  Administered 2011-07-28: 1 mg via INTRAVENOUS
  Filled 2011-07-28: qty 1

## 2011-07-28 MED ORDER — IOHEXOL 350 MG/ML SOLN
100.0000 mL | Freq: Once | INTRAVENOUS | Status: AC | PRN
  Administered 2011-07-28: 100 mL via INTRAVENOUS

## 2011-07-28 NOTE — ED Provider Notes (Signed)
History     CSN: [id number]  Arrival date & time 07/28/11  1559   First MD Initiated Contact with Patient 07/28/11 1600      Chief Complaint  Patient presents with  . Back Pain    (Consider location/radiation/quality/duration/timing/severity/associated sxs/prior treatment) HPI Comments: Patient comes in today with a chief complaint of back pain.  He reports that the pain is located in the area of the left scapula and radiates into the left axilla.  Patient had recent extension of thoracolumbar fusion done on 07/18/11 and was discharged from the hospital on 07/21/11.  He did not have any postoperative complications.  His pain was manageable after the surgery.  He has been taking both Oxycodone and Hydrocodone for the pain.  However, this morning the pain became much worse.  He describes the pain as a stabbing pain.  Pain worse with taking a deep breath, movement, and ambulation.  He denies any fever or chills.  No drainage from the surgical incision.  No bowel or bladder incontinence.  No numbness or tingling of lower extremities.    The history is provided by the patient.    Past Medical History  Diagnosis Date  . Heart murmur     arrythmia  . AAA (abdominal aortic aneurysm)   . Hyperlipidemia     takes Niacin daily and Lipitor daily  . Joint pain   . Leg pain 05-04-10    with walking  . History of shingles   . Peripheral vascular disease     s/p AAA stent  . Dysrhythmia     palpitations- sees dr Eldridge Dace  . ADD (attention deficit disorder)   . Bipolar affective disorder     in remission on meds  . Chronic back pain     [facility]  . Colon polyps     Dr Matthias Hughs does a colonoscopy every 5 years  . Hypertension     takes Amlodipine and Quinapril daily  . Peripheral edema     takes Furosemide prn  . Hx of seasonal allergies   . Arthritis     left knee  . Chronic back pain     scoliosis  . Hx of scabies Mid Feb 2013  . GERD (gastroesophageal reflux  disease)     takes Zantac daily  . Constipation     takes Colace prn  . Urinary urgency   . Nocturia   . Enlarged prostate     slightly  . Blood transfusion     several times  . Bipolar 1 disorder     takes Seroquel nightly  . Depression     takes Effexor daily  . Insomnia     takes Trazodone nightly  . Anxiety     takes Clonazepam daily  . Memory loss     short and long term    Past Surgical History  Procedure Date  . Spine surgery 12/2009    Spinal Fusion by Dr. Alveda Reasons  . Abdominal aortic aneurysm repair 11/2009    Stent graft repair  . Foot surgery     Multiple surgeries on right foot  . Shoulder surgery     Right shoulder  . Tonsillectomy   . Testicle surgery     right testicle  . Turp vaporization   . Cholecystectomy   . Back surgery     thoracic spine  . Spinal cord stimulator implant 2009  . Vascular surgery     AAA stent repair  .  Cardiac catheterization     15 yrs ago  . Spinal fusion 04/04/2011    Procedure: FUSION POSTERIOR SPINAL MULTILEVEL/SCOLIOSIS;  Surgeon: Charlsie Quest, MD;  Location: [facility] OR;  Service: Orthopedics;  Laterality: N/A;  repair thoracic and lumbar  pseudoarthrosis, revise spinal cord stimulator, extend thoracic fusion, iliac crest bone graft  . Colonoscopy     Family History  Problem Relation Age of Onset  . Cancer Mother     melanoma  . Diabetes Father   . Arthritis Sister     rheumatoid  . Anesthesia problems Neg Hx   . Hypotension Neg Hx   . Malignant hyperthermia Neg Hx   . Pseudochol deficiency Neg Hx     History  Substance Use Topics  . Smoking status: Current Everyday Smoker -- 0.5 packs/day for 50 years    Types: Cigars  . Smokeless tobacco: Not on file   Comment: states smokes "little cigars"  . Alcohol Use: No      Review of Systems  Constitutional: Negative for fever and chills.  Respiratory: Negative for cough and shortness of breath.   Cardiovascular: Negative for chest pain and leg swelling.    Gastrointestinal: Negative for nausea and vomiting.  Musculoskeletal: Positive for back pain and gait problem.  Skin: Negative for color change.  Neurological: Negative for dizziness, syncope and light-headedness.  All other systems reviewed and are negative.    Allergies  Review of patient's allergies indicates no known allergies.  Home Medications   Current Outpatient Rx  Name Route Sig Dispense Refill  . AMLODIPINE BESYLATE 5 MG PO TABS Oral Take 5 mg by mouth daily.      Marland Kitchen VITAMIN C 1000 MG PO TABS Oral Take 1,000 mg by mouth daily.    . ASPIRIN 325 MG PO TABS Oral Take 325 mg by mouth. One half tablet daily    . ATORVASTATIN CALCIUM 40 MG PO TABS Oral Take 40 mg by mouth daily.    . B COMPLEX PO TABS Oral Take 1 tablet by mouth every morning.    Marland Kitchen CALCIUM CARBONATE 600 MG PO TABS Oral Take 600 mg by mouth daily.      Marland Kitchen VITAMIN D 1000 UNITS PO TABS Oral Take 1,000 Units by mouth every morning.     Marland Kitchen CLONAZEPAM 1 MG PO TABS Oral Take 1 mg by mouth 2 (two) times daily as needed. 2 at night for sleep & 1 during the day for anxiety.    Marland Kitchen DIPHENHYDRAMINE HCL 25 MG PO CAPS Oral Take 50 mg by mouth every 6 (six) hours as needed.    Marland Kitchen DOCUSATE SODIUM 100 MG PO CAPS Oral Take 100 mg by mouth 2 (two) times daily as needed. Constipation    . FUROSEMIDE 40 MG PO TABS Oral Take 80 mg by mouth as needed.     Marland Kitchen GABAPENTIN 300 MG PO CAPS Oral Take 300 mg by mouth 4 (four) times daily.     Marland Kitchen HYDROCHLOROTHIAZIDE 25 MG PO TABS Oral Take 25 mg by mouth daily.      Marland Kitchen HYDROCODONE-ACETAMINOPHEN 10-325 MG PO TABS Oral Take 1-2 tablets by mouth every 4 (four) hours as needed for pain. For break through pain. 120 tablet 1  . LAMOTRIGINE 200 MG PO TABS Oral Take 200 mg by mouth at bedtime.     Marland Kitchen MAGNESIUM OXIDE 400 MG PO TABS Oral Take 400 mg by mouth daily.      Marland Kitchen METHOCARBAMOL 500 MG PO TABS Oral  Take 1,000 mg by mouth 3 (three) times daily. Takes at the same time as the hydrocodone.    Marland Kitchen ONE-DAILY  MULTI VITAMINS PO TABS Oral Take 1 tablet by mouth daily.      Marland Kitchen NIACIN 500 MG PO TABS Oral Take 500 mg by mouth daily with breakfast.      . FISH OIL PO Oral Take 2,000 mg by mouth daily.     . OXYCODONE HCL 30 MG PO TABS Oral Take 30 mg by mouth every 4 (four) hours as needed. For pain    . PSEUDOEPHEDRINE HCL 30 MG PO TABS Oral Take 60 mg by mouth 2 (two) times daily. Congestion    . QUETIAPINE FUMARATE 100 MG PO TABS Oral Take 200 mg by mouth at bedtime. Takes 1 capsules at bedtime and 1 capsule during the day if needed for sleep.    . QUINAPRIL HCL 40 MG PO TABS Oral Take 80 mg by mouth daily.     Marland Kitchen RANITIDINE HCL 75 MG PO TABS Oral Take 75 mg by mouth. Take one daily    . SIMETHICONE 125 MG PO CHEW Oral Chew 125 mg by mouth every morning.    . TRAZODONE HCL 100 MG PO TABS Oral Take 350 mg by mouth at bedtime.     . VENLAFAXINE HCL ER 150 MG PO CP24 Oral Take 150 mg by mouth daily.      Marland Kitchen VITAMIN E 1000 UNITS PO CAPS Oral Take 1,000 Units by mouth daily.      BP 151/82  Pulse 93  Temp(Src) 98.3 F (36.8 C) (Oral)  Resp 16  Ht 5\' 9"  (1.753 m)  Wt 280 lb (127.007 kg)  BMI 41.35 kg/m2  SpO2 98%  Physical Exam  Nursing note and vitals reviewed. Constitutional: He appears well-developed and well-nourished. No distress.  HENT:  Head: Normocephalic and atraumatic.  Mouth/Throat: Oropharynx is clear and moist.  Neck: Normal range of motion.  Cardiovascular: Normal rate, regular rhythm, normal heart sounds and intact distal pulses.   Pulses:      Dorsalis pedis pulses are 2+ on the right side, and 2+ on the left side.  Pulmonary/Chest: Effort normal and breath sounds normal. No respiratory distress. He has no wheezes. He has no rales.  Abdominal: Soft. He exhibits no distension. There is no tenderness.  Neurological: He is alert. He has normal strength. No sensory deficit.  Skin: Skin is warm and dry. He is not diaphoretic.       Incision without surrounding erythema, induration, or  warmth  Psychiatric: He has a normal mood and affect.    ED Course  Procedures (including critical care time)  Labs Reviewed - No data to display Dg Chest 2 View  07/28/2011  *RADIOLOGY REPORT*  Clinical Data: Chest pain with deep inspiration.  CHEST - 2 VIEW  Comparison: 07/13/2011  Findings: Heart size and pulmonary vascularity are normal and the lungs are clear.  No pneumothorax.  No pleural effusion.  Rods and hooks and screws are noted in the thoracic spine, unchanged. Tortuosity of the thoracic aorta.  IMPRESSION: No acute abnormality of the chest.  Original Report Authenticated By: Gwynn Burly, M.D.   Dg Thoracic Spine 2 View  07/28/2011  *RADIOLOGY REPORT*  Clinical Data: Thoracic spine pain.  The pain radiates under the left shoulder blade into the left side of the chest.  THORACIC SPINE - 2 VIEW  Comparison: 07/21/2011  Findings: Rods, pedicle screws, and hooks appear unchanged in  position since the prior study.  Alignment of the thoracic vertebra is normal.  No bone destruction.  Neurostimulator seen at the T8-9 level, unchanged.  IMPRESSION: No acute abnormality.  No change since 07/21/2011.  Original Report Authenticated By: Gwynn Burly, M.D.   Dg Lumbar Spine Complete  07/28/2011  *RADIOLOGY REPORT*  Clinical Data: Low back pain.  LUMBAR SPINE - COMPLETE 4+ VIEW  Comparison: Radiographs dated 07/21/2011  Findings: The posterior rods and multiple pedicle screws as well as a screw through the left sacroiliac joint appear unchanged. Interbody fusion device at L5 S1 is unchanged. Aortobi-iliac stent graft is noted.  There is slight lucency around the screw through the left sacroiliac joint, essentially unchanged. No acute osseous abnormality.  IMPRESSION: No acute abnormalities.  Postsurgical changes as described.  Original Report Authenticated By: Gwynn Burly, M.D.   Ct Angio Chest W/cm &/or Wo Cm  07/28/2011  *RADIOLOGY REPORT*  Clinical Data: Left-sided chest pain,  elevated D-dimer.  Recent spine surgery 1 week ago  CT ANGIOGRAPHY CHEST  Technique:  Multidetector CT imaging of the chest using the standard protocol during bolus administration of intravenous contrast. Multiplanar reconstructed images including MIPs were obtained and reviewed to evaluate the vascular anatomy.  Contrast: OMNIPAQUE IOHEXOL 350 MG/ML SOLN  Comparison: Chest radiograph 07/28/2011, chest CT 11/01/2010(thoracic spine technique)  Findings: Examination is suboptimal for evaluation for pulmonary embolism due to early bolus timing.  Allowing for this, there is no central filling defect within either main pulmonary artery to suggest pulmonary embolism.  Heart size is normal.  Moderate coronary arterial calcification. Streak artifact from thoracic fusion hardware noted.  No pericardial or pleural effusion.  Incomplete imaging of the upper abdomen demonstrates bilateral adrenal nodularity but not further evaluated on the current exam.  Fluid density probable right upper renal pole cyst incompletely visualized.  No lymphadenopathy.  Emphysematous changes are present in the lungs.  Central airways are patent.  No focal consolidation, nodule, or pulmonary mass. Subpleural left upper lobe 3 mm nodule image 44 is unlikely to be clinically significant.  No evidence of complication.  Thoracic neural stimulator in place. Cholecystectomy clips noted.  IMPRESSION: No CT evidence for acute pulmonary embolism to the level of either main pulmonary artery, allowing for early bolus timing and suboptimal visualization.  No acute cardiopulmonary process.  Emphysematous changes.  Bilateral adrenal nodularity, incompletely visualized.  This is subjectively unchanged when compared to a dissimilar prior exam and does not likely necessitate further specific follow-up.  Original Report Authenticated By: Harrel Lemon, M.D.     1. Status post spinal surgery   2. Thoracic back pain       MDM  Patient status post  extension of thoracolumbar fusion, which was done on 07/18/11.  He comes in today with pain in the area of the thoracic spine.  No signs of infection on exam.  Patient afebrile.  Xrays appear unchanged and all hardware appears to be in place.  Patient with elevated d-dimer.  Therefore, CTA ordered.  Discussed the CTA with the radiologist and she reports that she was able to get a good view of the thoracic spine and did not see any abnormalities.  Discussed results with patient.  He reports that he can go to the Orthopedic clinic tomorrow to be evaluated further.  Return precautions have been discussed with patient.  He verbalizes understanding and is in agreement with plan for discharge.        Nayra Coury Anne Shutter, PA-C  07/30/11 0313 

## 2011-07-28 NOTE — ED Notes (Signed)
PA at bedside.

## 2011-07-28 NOTE — ED Notes (Signed)
Per EMS, pt had back surgery on May 7th to thoracic spine. Presented to [facility] office today for back pain that woke him up this morning at 0600 in left upper back radiating around to the axillary area.  Pain is worse with movement or breathing. Sharp pain. 07/27/11 was first active day but pt denies any pulling or pushing on anything. Unable to breathe deeply. No deformity.

## 2011-07-30 NOTE — ED Provider Notes (Signed)
Medical screening examination/treatment/procedure(s) were conducted as a shared visit with non-physician practitioner(s) and myself.  I personally evaluated the patient during the encounter On my exam the patient was in no distress, speaking clearly, w no appreciable dyspnea.  Given his RF of surgical immobilization and smoking, PE was considered along with post-operative infection, although his presentation was most c/w post-surgical pain.  Following the return of reassuring labs / radiographic studies, the patient was discharged in stable condition.  Gerhard Munch, MD 07/30/11 1435

## 2011-08-09 DIAGNOSIS — Z981 Arthrodesis status: Secondary | ICD-10-CM | POA: Diagnosis not present

## 2011-08-09 DIAGNOSIS — M4120 Other idiopathic scoliosis, site unspecified: Secondary | ICD-10-CM | POA: Diagnosis not present

## 2011-08-24 DIAGNOSIS — M961 Postlaminectomy syndrome, not elsewhere classified: Secondary | ICD-10-CM | POA: Diagnosis not present

## 2011-08-24 DIAGNOSIS — IMO0002 Reserved for concepts with insufficient information to code with codable children: Secondary | ICD-10-CM | POA: Diagnosis not present

## 2011-09-25 DIAGNOSIS — M961 Postlaminectomy syndrome, not elsewhere classified: Secondary | ICD-10-CM | POA: Diagnosis not present

## 2011-09-25 DIAGNOSIS — G894 Chronic pain syndrome: Secondary | ICD-10-CM | POA: Diagnosis not present

## 2011-10-16 DIAGNOSIS — M4120 Other idiopathic scoliosis, site unspecified: Secondary | ICD-10-CM | POA: Diagnosis not present

## 2011-10-23 DIAGNOSIS — G8928 Other chronic postprocedural pain: Secondary | ICD-10-CM | POA: Diagnosis not present

## 2011-10-23 DIAGNOSIS — IMO0002 Reserved for concepts with insufficient information to code with codable children: Secondary | ICD-10-CM | POA: Diagnosis not present

## 2011-10-23 DIAGNOSIS — M961 Postlaminectomy syndrome, not elsewhere classified: Secondary | ICD-10-CM | POA: Diagnosis not present

## 2011-10-25 DIAGNOSIS — H6690 Otitis media, unspecified, unspecified ear: Secondary | ICD-10-CM | POA: Diagnosis not present

## 2011-10-25 DIAGNOSIS — R42 Dizziness and giddiness: Secondary | ICD-10-CM | POA: Diagnosis not present

## 2011-10-25 DIAGNOSIS — H60399 Other infective otitis externa, unspecified ear: Secondary | ICD-10-CM | POA: Diagnosis not present

## 2011-11-21 DIAGNOSIS — G894 Chronic pain syndrome: Secondary | ICD-10-CM | POA: Diagnosis not present

## 2011-11-21 DIAGNOSIS — M961 Postlaminectomy syndrome, not elsewhere classified: Secondary | ICD-10-CM | POA: Diagnosis not present

## 2011-11-21 DIAGNOSIS — IMO0002 Reserved for concepts with insufficient information to code with codable children: Secondary | ICD-10-CM | POA: Diagnosis not present

## 2011-12-06 DIAGNOSIS — M545 Low back pain, unspecified: Secondary | ICD-10-CM | POA: Diagnosis not present

## 2011-12-06 DIAGNOSIS — M25569 Pain in unspecified knee: Secondary | ICD-10-CM | POA: Diagnosis not present

## 2011-12-06 DIAGNOSIS — M546 Pain in thoracic spine: Secondary | ICD-10-CM | POA: Diagnosis not present

## 2011-12-06 DIAGNOSIS — M25559 Pain in unspecified hip: Secondary | ICD-10-CM | POA: Diagnosis not present

## 2011-12-12 DIAGNOSIS — F3111 Bipolar disorder, current episode manic without psychotic features, mild: Secondary | ICD-10-CM | POA: Diagnosis not present

## 2011-12-14 DIAGNOSIS — M6281 Muscle weakness (generalized): Secondary | ICD-10-CM | POA: Diagnosis not present

## 2011-12-14 DIAGNOSIS — M545 Low back pain, unspecified: Secondary | ICD-10-CM | POA: Diagnosis not present

## 2011-12-14 DIAGNOSIS — M546 Pain in thoracic spine: Secondary | ICD-10-CM | POA: Diagnosis not present

## 2011-12-21 DIAGNOSIS — M961 Postlaminectomy syndrome, not elsewhere classified: Secondary | ICD-10-CM | POA: Diagnosis not present

## 2011-12-21 DIAGNOSIS — IMO0002 Reserved for concepts with insufficient information to code with codable children: Secondary | ICD-10-CM | POA: Diagnosis not present

## 2011-12-25 DIAGNOSIS — M545 Low back pain, unspecified: Secondary | ICD-10-CM | POA: Diagnosis not present

## 2011-12-25 DIAGNOSIS — M6281 Muscle weakness (generalized): Secondary | ICD-10-CM | POA: Diagnosis not present

## 2011-12-25 DIAGNOSIS — M546 Pain in thoracic spine: Secondary | ICD-10-CM | POA: Diagnosis not present

## 2012-01-01 DIAGNOSIS — R5381 Other malaise: Secondary | ICD-10-CM | POA: Diagnosis not present

## 2012-01-01 DIAGNOSIS — F17200 Nicotine dependence, unspecified, uncomplicated: Secondary | ICD-10-CM | POA: Diagnosis not present

## 2012-01-01 DIAGNOSIS — Z23 Encounter for immunization: Secondary | ICD-10-CM | POA: Diagnosis not present

## 2012-01-01 DIAGNOSIS — I10 Essential (primary) hypertension: Secondary | ICD-10-CM | POA: Diagnosis not present

## 2012-01-01 DIAGNOSIS — E7800 Pure hypercholesterolemia, unspecified: Secondary | ICD-10-CM | POA: Diagnosis not present

## 2012-01-10 DIAGNOSIS — M4120 Other idiopathic scoliosis, site unspecified: Secondary | ICD-10-CM | POA: Diagnosis not present

## 2012-01-10 DIAGNOSIS — Z981 Arthrodesis status: Secondary | ICD-10-CM | POA: Diagnosis not present

## 2012-01-18 DIAGNOSIS — Z981 Arthrodesis status: Secondary | ICD-10-CM | POA: Diagnosis not present

## 2012-01-18 DIAGNOSIS — M4120 Other idiopathic scoliosis, site unspecified: Secondary | ICD-10-CM | POA: Diagnosis not present

## 2012-01-18 DIAGNOSIS — M546 Pain in thoracic spine: Secondary | ICD-10-CM | POA: Diagnosis not present

## 2012-01-19 DIAGNOSIS — H9190 Unspecified hearing loss, unspecified ear: Secondary | ICD-10-CM | POA: Diagnosis not present

## 2012-01-19 DIAGNOSIS — H9319 Tinnitus, unspecified ear: Secondary | ICD-10-CM | POA: Diagnosis not present

## 2012-01-19 DIAGNOSIS — H93299 Other abnormal auditory perceptions, unspecified ear: Secondary | ICD-10-CM | POA: Diagnosis not present

## 2012-01-22 DIAGNOSIS — M961 Postlaminectomy syndrome, not elsewhere classified: Secondary | ICD-10-CM | POA: Diagnosis not present

## 2012-01-22 DIAGNOSIS — G8928 Other chronic postprocedural pain: Secondary | ICD-10-CM | POA: Diagnosis not present

## 2012-01-22 DIAGNOSIS — IMO0002 Reserved for concepts with insufficient information to code with codable children: Secondary | ICD-10-CM | POA: Diagnosis not present

## 2012-01-23 DIAGNOSIS — M545 Low back pain, unspecified: Secondary | ICD-10-CM | POA: Diagnosis not present

## 2012-01-23 DIAGNOSIS — M6281 Muscle weakness (generalized): Secondary | ICD-10-CM | POA: Diagnosis not present

## 2012-01-23 DIAGNOSIS — M546 Pain in thoracic spine: Secondary | ICD-10-CM | POA: Diagnosis not present

## 2012-01-24 DIAGNOSIS — M19049 Primary osteoarthritis, unspecified hand: Secondary | ICD-10-CM | POA: Diagnosis not present

## 2012-01-24 DIAGNOSIS — M19039 Primary osteoarthritis, unspecified wrist: Secondary | ICD-10-CM | POA: Diagnosis not present

## 2012-01-25 DIAGNOSIS — M545 Low back pain, unspecified: Secondary | ICD-10-CM | POA: Diagnosis not present

## 2012-01-25 DIAGNOSIS — M6281 Muscle weakness (generalized): Secondary | ICD-10-CM | POA: Diagnosis not present

## 2012-01-25 DIAGNOSIS — M546 Pain in thoracic spine: Secondary | ICD-10-CM | POA: Diagnosis not present

## 2012-01-30 DIAGNOSIS — J441 Chronic obstructive pulmonary disease with (acute) exacerbation: Secondary | ICD-10-CM | POA: Diagnosis not present

## 2012-01-30 DIAGNOSIS — R059 Cough, unspecified: Secondary | ICD-10-CM | POA: Diagnosis not present

## 2012-01-30 DIAGNOSIS — R05 Cough: Secondary | ICD-10-CM | POA: Diagnosis not present

## 2012-01-31 ENCOUNTER — Ambulatory Visit
Admission: RE | Admit: 2012-01-31 | Discharge: 2012-01-31 | Disposition: A | Discharge status: Home / Self Care | Payer: Medicare Other | Source: Ambulatory Visit | Attending: Family Medicine | Admitting: Family Medicine

## 2012-01-31 ENCOUNTER — Other Ambulatory Visit: Payer: Self-pay | Admitting: Family Medicine

## 2012-01-31 DIAGNOSIS — R059 Cough, unspecified: Secondary | ICD-10-CM

## 2012-01-31 DIAGNOSIS — R918 Other nonspecific abnormal finding of lung field: Secondary | ICD-10-CM | POA: Diagnosis not present

## 2012-01-31 DIAGNOSIS — R05 Cough: Secondary | ICD-10-CM

## 2012-02-14 DIAGNOSIS — M546 Pain in thoracic spine: Secondary | ICD-10-CM | POA: Diagnosis not present

## 2012-02-16 DIAGNOSIS — R918 Other nonspecific abnormal finding of lung field: Secondary | ICD-10-CM | POA: Diagnosis not present

## 2012-02-16 DIAGNOSIS — J329 Chronic sinusitis, unspecified: Secondary | ICD-10-CM | POA: Diagnosis not present

## 2012-02-22 DIAGNOSIS — IMO0002 Reserved for concepts with insufficient information to code with codable children: Secondary | ICD-10-CM | POA: Diagnosis not present

## 2012-02-22 DIAGNOSIS — G8928 Other chronic postprocedural pain: Secondary | ICD-10-CM | POA: Diagnosis not present

## 2012-02-22 DIAGNOSIS — M961 Postlaminectomy syndrome, not elsewhere classified: Secondary | ICD-10-CM | POA: Diagnosis not present

## 2012-03-11 DIAGNOSIS — F3111 Bipolar disorder, current episode manic without psychotic features, mild: Secondary | ICD-10-CM | POA: Diagnosis not present

## 2012-03-18 ENCOUNTER — Other Ambulatory Visit: Payer: Self-pay | Admitting: *Deleted

## 2012-03-18 DIAGNOSIS — Z48812 Encounter for surgical aftercare following surgery on the circulatory system: Secondary | ICD-10-CM

## 2012-03-18 DIAGNOSIS — I714 Abdominal aortic aneurysm, without rupture, unspecified: Secondary | ICD-10-CM

## 2012-03-20 DIAGNOSIS — H40019 Open angle with borderline findings, low risk, unspecified eye: Secondary | ICD-10-CM | POA: Diagnosis not present

## 2012-03-20 DIAGNOSIS — H5210 Myopia, unspecified eye: Secondary | ICD-10-CM | POA: Diagnosis not present

## 2012-03-27 ENCOUNTER — Encounter: Payer: Self-pay | Admitting: Neurosurgery

## 2012-03-28 ENCOUNTER — Ambulatory Visit: Payer: Medicare Other | Admitting: Neurosurgery

## 2012-03-28 ENCOUNTER — Other Ambulatory Visit: Payer: Medicare Other

## 2012-03-28 DIAGNOSIS — IMO0002 Reserved for concepts with insufficient information to code with codable children: Secondary | ICD-10-CM | POA: Diagnosis not present

## 2012-03-28 DIAGNOSIS — M25539 Pain in unspecified wrist: Secondary | ICD-10-CM | POA: Diagnosis not present

## 2012-03-28 DIAGNOSIS — M961 Postlaminectomy syndrome, not elsewhere classified: Secondary | ICD-10-CM | POA: Diagnosis not present

## 2012-04-01 DIAGNOSIS — E7800 Pure hypercholesterolemia, unspecified: Secondary | ICD-10-CM | POA: Diagnosis not present

## 2012-04-01 DIAGNOSIS — I10 Essential (primary) hypertension: Secondary | ICD-10-CM | POA: Diagnosis not present

## 2012-04-01 DIAGNOSIS — F17200 Nicotine dependence, unspecified, uncomplicated: Secondary | ICD-10-CM | POA: Diagnosis not present

## 2012-04-02 DIAGNOSIS — M779 Enthesopathy, unspecified: Secondary | ICD-10-CM | POA: Diagnosis not present

## 2012-04-24 DIAGNOSIS — IMO0002 Reserved for concepts with insufficient information to code with codable children: Secondary | ICD-10-CM | POA: Diagnosis not present

## 2012-04-24 DIAGNOSIS — M25539 Pain in unspecified wrist: Secondary | ICD-10-CM | POA: Diagnosis not present

## 2012-04-24 DIAGNOSIS — M961 Postlaminectomy syndrome, not elsewhere classified: Secondary | ICD-10-CM | POA: Diagnosis not present

## 2012-05-09 DIAGNOSIS — M19039 Primary osteoarthritis, unspecified wrist: Secondary | ICD-10-CM | POA: Diagnosis not present

## 2012-05-09 DIAGNOSIS — M65839 Other synovitis and tenosynovitis, unspecified forearm: Secondary | ICD-10-CM | POA: Diagnosis not present

## 2012-05-15 DIAGNOSIS — M19039 Primary osteoarthritis, unspecified wrist: Secondary | ICD-10-CM | POA: Diagnosis not present

## 2012-05-20 DIAGNOSIS — M19039 Primary osteoarthritis, unspecified wrist: Secondary | ICD-10-CM | POA: Diagnosis not present

## 2012-05-22 DIAGNOSIS — M5137 Other intervertebral disc degeneration, lumbosacral region: Secondary | ICD-10-CM | POA: Diagnosis not present

## 2012-05-22 DIAGNOSIS — M25539 Pain in unspecified wrist: Secondary | ICD-10-CM | POA: Diagnosis not present

## 2012-05-22 DIAGNOSIS — IMO0002 Reserved for concepts with insufficient information to code with codable children: Secondary | ICD-10-CM | POA: Diagnosis not present

## 2012-05-22 DIAGNOSIS — M961 Postlaminectomy syndrome, not elsewhere classified: Secondary | ICD-10-CM | POA: Diagnosis not present

## 2012-05-24 ENCOUNTER — Other Ambulatory Visit: Payer: Self-pay | Admitting: Orthopedic Surgery

## 2012-05-29 DIAGNOSIS — IMO0002 Reserved for concepts with insufficient information to code with codable children: Secondary | ICD-10-CM | POA: Diagnosis not present

## 2012-05-29 DIAGNOSIS — M1710 Unilateral primary osteoarthritis, unspecified knee: Secondary | ICD-10-CM | POA: Diagnosis not present

## 2012-06-04 ENCOUNTER — Encounter (HOSPITAL_BASED_OUTPATIENT_CLINIC_OR_DEPARTMENT_OTHER): Payer: Self-pay | Admitting: *Deleted

## 2012-06-04 NOTE — Progress Notes (Signed)
06/04/12 1113  OBSTRUCTIVE SLEEP APNEA  Have you ever been diagnosed with sleep apnea through a sleep study? No  Do you snore loudly (loud enough to be heard through closed doors)?  0  Do you often feel tired, fatigued, or sleepy during the daytime? 0  Has anyone observed you stop breathing during your sleep? 0  Do you have, or are you being treated for high blood pressure? 1  BMI more than 35 kg/m2? 1  Age over 66 years old? 1  Gender: 1  Obstructive Sleep Apnea Score 4  Score 4 or greater  Results sent to PCP

## 2012-06-04 NOTE — Progress Notes (Signed)
Pt sees dr Jocelyn Lamer for aortic stent--called for notes-to come in for bmet-denies sleep apnea

## 2012-06-06 ENCOUNTER — Encounter (HOSPITAL_BASED_OUTPATIENT_CLINIC_OR_DEPARTMENT_OTHER)
Admission: RE | Admit: 2012-06-06 | Discharge: 2012-06-06 | Disposition: A | Discharge status: Home / Self Care | Payer: Medicare Other | Source: Ambulatory Visit | Attending: Orthopedic Surgery | Admitting: Orthopedic Surgery

## 2012-06-06 DIAGNOSIS — E785 Hyperlipidemia, unspecified: Secondary | ICD-10-CM | POA: Diagnosis not present

## 2012-06-06 DIAGNOSIS — G4700 Insomnia, unspecified: Secondary | ICD-10-CM | POA: Diagnosis not present

## 2012-06-06 DIAGNOSIS — M19039 Primary osteoarthritis, unspecified wrist: Secondary | ICD-10-CM | POA: Diagnosis not present

## 2012-06-06 DIAGNOSIS — F411 Generalized anxiety disorder: Secondary | ICD-10-CM | POA: Diagnosis not present

## 2012-06-06 DIAGNOSIS — F17200 Nicotine dependence, unspecified, uncomplicated: Secondary | ICD-10-CM | POA: Diagnosis not present

## 2012-06-06 DIAGNOSIS — I10 Essential (primary) hypertension: Secondary | ICD-10-CM | POA: Diagnosis not present

## 2012-06-06 DIAGNOSIS — F319 Bipolar disorder, unspecified: Secondary | ICD-10-CM | POA: Diagnosis not present

## 2012-06-06 DIAGNOSIS — K219 Gastro-esophageal reflux disease without esophagitis: Secondary | ICD-10-CM | POA: Diagnosis not present

## 2012-06-06 LAB — BASIC METABOLIC PANEL
BUN: 15 mg/dL (ref 6–23)
CO2: 26 mEq/L (ref 19–32)
Calcium: 9.2 mg/dL (ref 8.4–10.5)
Chloride: 101 mEq/L (ref 96–112)
Creatinine, Ser: 0.92 mg/dL (ref 0.50–1.35)
GFR calc Af Amer: >90 mL/min (ref >90)
GFR calc non Af Amer: 86 mL/min — ABNORMAL LOW (ref >90)
Glucose, Bld: 83 mg/dL (ref 70–99)
Potassium: 4.5 mEq/L (ref 3.5–5.1)
Sodium: 137 mEq/L (ref 135–145)

## 2012-06-07 ENCOUNTER — Encounter (HOSPITAL_BASED_OUTPATIENT_CLINIC_OR_DEPARTMENT_OTHER): Payer: Self-pay | Admitting: *Deleted

## 2012-06-07 ENCOUNTER — Encounter (HOSPITAL_BASED_OUTPATIENT_CLINIC_OR_DEPARTMENT_OTHER)
Admission: RE | Disposition: A | Discharge status: Home / Self Care | Payer: Self-pay | Source: Ambulatory Visit | Attending: Orthopedic Surgery

## 2012-06-07 ENCOUNTER — Ambulatory Visit (HOSPITAL_BASED_OUTPATIENT_CLINIC_OR_DEPARTMENT_OTHER)
Admission: RE | Admit: 2012-06-07 | Discharge: 2012-06-07 | Disposition: A | Discharge status: Home / Self Care | Payer: Medicare Other | Source: Ambulatory Visit | Attending: Orthopedic Surgery | Admitting: Orthopedic Surgery

## 2012-06-07 ENCOUNTER — Ambulatory Visit (HOSPITAL_BASED_OUTPATIENT_CLINIC_OR_DEPARTMENT_OTHER): Payer: Medicare Other | Admitting: Anesthesiology

## 2012-06-07 ENCOUNTER — Encounter (HOSPITAL_BASED_OUTPATIENT_CLINIC_OR_DEPARTMENT_OTHER): Payer: Self-pay | Admitting: Anesthesiology

## 2012-06-07 DIAGNOSIS — G8918 Other acute postprocedural pain: Secondary | ICD-10-CM | POA: Diagnosis not present

## 2012-06-07 DIAGNOSIS — F411 Generalized anxiety disorder: Secondary | ICD-10-CM | POA: Insufficient documentation

## 2012-06-07 DIAGNOSIS — E785 Hyperlipidemia, unspecified: Secondary | ICD-10-CM | POA: Insufficient documentation

## 2012-06-07 DIAGNOSIS — I10 Essential (primary) hypertension: Secondary | ICD-10-CM | POA: Diagnosis not present

## 2012-06-07 DIAGNOSIS — M19039 Primary osteoarthritis, unspecified wrist: Secondary | ICD-10-CM | POA: Insufficient documentation

## 2012-06-07 DIAGNOSIS — F319 Bipolar disorder, unspecified: Secondary | ICD-10-CM | POA: Insufficient documentation

## 2012-06-07 DIAGNOSIS — F17200 Nicotine dependence, unspecified, uncomplicated: Secondary | ICD-10-CM | POA: Insufficient documentation

## 2012-06-07 DIAGNOSIS — M25539 Pain in unspecified wrist: Secondary | ICD-10-CM | POA: Diagnosis not present

## 2012-06-07 DIAGNOSIS — M19049 Primary osteoarthritis, unspecified hand: Secondary | ICD-10-CM | POA: Diagnosis not present

## 2012-06-07 DIAGNOSIS — K219 Gastro-esophageal reflux disease without esophagitis: Secondary | ICD-10-CM | POA: Insufficient documentation

## 2012-06-07 DIAGNOSIS — G4700 Insomnia, unspecified: Secondary | ICD-10-CM | POA: Insufficient documentation

## 2012-06-07 HISTORY — DX: Complete loss of teeth, unspecified cause, unspecified class: K08.109

## 2012-06-07 HISTORY — DX: Complete loss of teeth, unspecified cause, unspecified class: Z97.2

## 2012-06-07 HISTORY — PX: GANGLION CYST EXCISION: SHX1691

## 2012-06-07 HISTORY — DX: Presence of spectacles and contact lenses: Z97.3

## 2012-06-07 LAB — POCT HEMOGLOBIN-HEMACUE: Hemoglobin: 13.9 g/dL (ref 13.0–17.0)

## 2012-06-07 SURGERY — EXCISION, GANGLION CYST, WRIST
Anesthesia: Regional | Site: Wrist | Laterality: Right | Wound class: Clean

## 2012-06-07 MED ORDER — ONDANSETRON HCL 4 MG/2ML IJ SOLN: 4.0000 mg | Freq: Once | INTRAMUSCULAR | Status: DC | PRN

## 2012-06-07 MED ORDER — LIDOCAINE HCL (CARDIAC) 20 MG/ML IV SOLN
INTRAVENOUS | Status: DC | PRN
  Administered 2012-06-07: 60 mg via INTRAVENOUS

## 2012-06-07 MED ORDER — OXYCODONE HCL 5 MG PO TABS: 5.0000 mg | ORAL_TABLET | Freq: Once | ORAL | Status: DC | PRN

## 2012-06-07 MED ORDER — DEXAMETHASONE SODIUM PHOSPHATE 10 MG/ML IJ SOLN
INTRAMUSCULAR | Status: DC | PRN
  Administered 2012-06-07: 10 mg

## 2012-06-07 MED ORDER — BUPIVACAINE-EPINEPHRINE PF 0.5-1:200000 % IJ SOLN
INTRAMUSCULAR | Status: DC | PRN
  Administered 2012-06-07: 25 mL

## 2012-06-07 MED ORDER — MIDAZOLAM HCL 2 MG/2ML IJ SOLN
1.0000 mg | INTRAMUSCULAR | Status: DC | PRN
  Administered 2012-06-07: 2 mg via INTRAVENOUS

## 2012-06-07 MED ORDER — ONDANSETRON HCL 4 MG/2ML IJ SOLN
INTRAMUSCULAR | Status: DC | PRN
  Administered 2012-06-07: 4 mg via INTRAVENOUS

## 2012-06-07 MED ORDER — FENTANYL CITRATE 0.05 MG/ML IJ SOLN
50.0000 ug | INTRAMUSCULAR | Status: DC | PRN
  Administered 2012-06-07: 50 ug via INTRAVENOUS

## 2012-06-07 MED ORDER — CHLORHEXIDINE GLUCONATE 4 % EX LIQD: 60.0000 mL | Freq: Once | CUTANEOUS | Status: DC

## 2012-06-07 MED ORDER — PROPOFOL INFUSION 10 MG/ML OPTIME
INTRAVENOUS | Status: DC | PRN
  Administered 2012-06-07: 300 mL via INTRAVENOUS
  Administered 2012-06-07: 150 mL via INTRAVENOUS

## 2012-06-07 MED ORDER — OXYCODONE HCL 5 MG/5ML PO SOLN: 5.0000 mg | Freq: Once | ORAL | Status: DC | PRN

## 2012-06-07 MED ORDER — HYDROMORPHONE HCL PF 1 MG/ML IJ SOLN: 0.2500 mg | INTRAMUSCULAR | Status: DC | PRN

## 2012-06-07 MED ORDER — CEFAZOLIN SODIUM-DEXTROSE 2-3 GM-% IV SOLR: 2.0000 g | INTRAVENOUS | Status: DC

## 2012-06-07 MED ORDER — DEXTROSE 5 % IV SOLN
3.0000 g | INTRAVENOUS | Status: DC | PRN
  Administered 2012-06-07: 3 g via INTRAVENOUS

## 2012-06-07 MED ORDER — LACTATED RINGERS IV SOLN
INTRAVENOUS | Status: DC
  Administered 2012-06-07 (×2): via INTRAVENOUS

## 2012-06-07 SURGICAL SUPPLY — 44 items
BANDAGE GAUZE ELAST BULKY 4 IN (GAUZE/BANDAGES/DRESSINGS) ×2 IMPLANT
BLADE MINI RND TIP GREEN BEAV (BLADE) ×2 IMPLANT
BLADE SURG 15 STRL LF DISP TIS (BLADE) ×1 IMPLANT
BLADE SURG 15 STRL SS (BLADE) ×1
BNDG COHESIVE 3X5 TAN STRL LF (GAUZE/BANDAGES/DRESSINGS) ×2 IMPLANT
BNDG ESMARK 4X9 LF (GAUZE/BANDAGES/DRESSINGS) ×2 IMPLANT
CHLORAPREP W/TINT 26ML (MISCELLANEOUS) ×2 IMPLANT
CLOTH BEACON ORANGE TIMEOUT ST (SAFETY) ×2 IMPLANT
CORDS BIPOLAR (ELECTRODE) ×2 IMPLANT
COVER MAYO STAND STRL (DRAPES) ×2 IMPLANT
COVER TABLE BACK 60X90 (DRAPES) ×2 IMPLANT
CUFF TOURNIQUET SINGLE 18IN (TOURNIQUET CUFF) IMPLANT
DECANTER SPIKE VIAL GLASS SM (MISCELLANEOUS) IMPLANT
DRAPE EXTREMITY T 121X128X90 (DRAPE) ×2 IMPLANT
DRAPE SURG 17X23 STRL (DRAPES) ×2 IMPLANT
GAUZE XEROFORM 1X8 LF (GAUZE/BANDAGES/DRESSINGS) ×2 IMPLANT
GLOVE BIO SURGEON STRL SZ 6.5 (GLOVE) ×2 IMPLANT
GLOVE BIOGEL PI IND STRL 7.0 (GLOVE) ×1 IMPLANT
GLOVE BIOGEL PI IND STRL 8.5 (GLOVE) ×1 IMPLANT
GLOVE BIOGEL PI INDICATOR 7.0 (GLOVE) ×1
GLOVE BIOGEL PI INDICATOR 8.5 (GLOVE) ×1
GLOVE SURG ORTHO 8.0 STRL STRW (GLOVE) ×2 IMPLANT
GOWN BRE IMP PREV XXLGXLNG (GOWN DISPOSABLE) ×2 IMPLANT
GOWN PREVENTION PLUS XLARGE (GOWN DISPOSABLE) ×2 IMPLANT
NEEDLE 27GAX1X1/2 (NEEDLE) ×2 IMPLANT
NS IRRIG 1000ML POUR BTL (IV SOLUTION) ×2 IMPLANT
PACK BASIN DAY SURGERY FS (CUSTOM PROCEDURE TRAY) ×2 IMPLANT
PAD CAST 3X4 CTTN HI CHSV (CAST SUPPLIES) ×1 IMPLANT
PADDING CAST ABS 4INX4YD NS (CAST SUPPLIES) ×1
PADDING CAST ABS COTTON 4X4 ST (CAST SUPPLIES) ×1 IMPLANT
PADDING CAST COTTON 3X4 STRL (CAST SUPPLIES) ×1
SPLINT PLASTER CAST XFAST 3X15 (CAST SUPPLIES) ×15 IMPLANT
SPLINT PLASTER XTRA FASTSET 3X (CAST SUPPLIES) ×15
SPONGE GAUZE 4X4 12PLY (GAUZE/BANDAGES/DRESSINGS) ×2 IMPLANT
STOCKINETTE 4X48 STRL (DRAPES) ×2 IMPLANT
SUT FIBERWIRE 3-0 18 TAPR NDL (SUTURE) ×2
SUT VIC AB 4-0 P2 18 (SUTURE) IMPLANT
SUT VICRYL 4-0 PS2 18IN ABS (SUTURE) ×2 IMPLANT
SUT VICRYL RAPIDE 4/0 PS 2 (SUTURE) ×2 IMPLANT
SUTURE FIBERWR 3-0 18 TAPR NDL (SUTURE) ×1 IMPLANT
SYR BULB 3OZ (MISCELLANEOUS) ×2 IMPLANT
SYR CONTROL 10ML LL (SYRINGE) ×2 IMPLANT
TOWEL OR 17X24 6PK STRL BLUE (TOWEL DISPOSABLE) ×4 IMPLANT
UNDERPAD 30X30 INCONTINENT (UNDERPADS AND DIAPERS) ×2 IMPLANT

## 2012-06-07 NOTE — Anesthesia Postprocedure Evaluation (Signed)
  Anesthesia Post-op Note  Patient: Michael Lutz  Procedure(s) Performed: Procedure(s): DISTAL POLE EXCISION RIGHT WRIST (Right)  Patient Location: PACU  Anesthesia Type:GA combined with regional for post-op pain  Level of Consciousness: awake, alert  and oriented  Airway and Oxygen Therapy: Patient Spontanous Breathing and Patient connected to face mask oxygen  Post-op Pain: none  Post-op Assessment: Post-op Vital signs reviewed  Post-op Vital Signs: Reviewed  Complications: No apparent anesthesia complications

## 2012-06-07 NOTE — Anesthesia Procedure Notes (Addendum)
Anesthesia Regional Block:  Supraclavicular block  Pre-Anesthetic Checklist: ,, timeout performed, Correct Patient, Correct Site, Correct Laterality, Correct Procedure, Correct Position, site marked, Risks and benefits discussed,  Surgical consent,  Pre-op evaluation,  At surgeon's request and post-op pain management  Laterality: Right and Upper  Prep: chloraprep       Needles:  Injection technique: Single-shot  Needle Type: Echogenic Needle     Needle Length: 5cm 5 cm Needle Gauge: 21    Additional Needles:  Procedures: ultrasound guided (picture in chart) Supraclavicular block Narrative:  Start time: 06/07/2012 11:40 AM End time: 06/07/2012 11:50 AM Injection made incrementally with aspirations every 5 mL.  Performed by: Personally  Anesthesiologist: Sheldon Silvan  Supraclavicular block Procedure Name: LMA Insertion Date/Time: 06/07/2012 12:58 PM Performed by: Meyer Russel Pre-anesthesia Checklist: Patient identified, Emergency Drugs available, Suction available and Patient being monitored Patient Re-evaluated:Patient Re-evaluated prior to inductionOxygen Delivery Method: Circle system utilized Preoxygenation: Pre-oxygenation with 100% oxygen Intubation Type: IV induction Ventilation: Mask ventilation without difficulty LMA: LMA inserted LMA Size: 5.0 Laryngoscope size: Glide scope. Grade View: Grade III Tube type: Oral Tube size: 8.0 mm Number of attempts: 3 Airway Equipment and Method: Oral airway,  Video-laryngoscopy and Rigid stylet Placement Confirmation: breath sounds checked- equal and bilateral,  ETT inserted through vocal cords under direct vision and positive ETCO2 Secured at: 24 cm Tube secured with: Tape Dental Injury: Teeth and Oropharynx as per pre-operative assessment  Difficulty Due To: Difficulty was unanticipated Comments: #5 LMA inserted easily in edentulous patient with large tongue and full beard. Unable to seat satisfactorily.  Elected to  intubate. Easy mask with #100 OAW.  Intubation with Glide scope by Dr. Ivin Booty.  +EtCO2, BSBE

## 2012-06-07 NOTE — Transfer of Care (Signed)
Immediate Anesthesia Transfer of Care Note  Patient: Michael Lutz  Procedure(s) Performed: Procedure(s): DISTAL POLE EXCISION RIGHT WRIST (Right)  Patient Location: PACU  Anesthesia Type:GA combined with regional for post-op pain  Level of Consciousness: awake, alert  and oriented  Airway & Oxygen Therapy: Patient Spontanous Breathing and Patient connected to face mask oxygen  Post-op Assessment: Report given to PACU RN, Post -op Vital signs reviewed and stable and Patient moving all extremities  Post vital signs: Reviewed and stable  Complications: No apparent anesthesia complications

## 2012-06-07 NOTE — Anesthesia Preprocedure Evaluation (Addendum)
Anesthesia Evaluation  Patient identified by MRN, date of birth, ID band Patient awake    Reviewed: Allergy & Precautions, H&P , NPO status , Patient's Chart, lab work & pertinent test results  Airway  TM Distance: >3 FB Neck ROM: Full    Dental  (+) Upper Dentures and Lower Dentures   Pulmonary  breath sounds clear to auscultation        Cardiovascular hypertension, Pt. on medications Rhythm:Regular     Neuro/Psych    GI/Hepatic GERD-  Medicated and Controlled,  Endo/Other  Morbid obesity  Renal/GU      Musculoskeletal   Abdominal   Peds  Hematology   Anesthesia Other Findings   Reproductive/Obstetrics                           Anesthesia Physical Anesthesia Plan  ASA: III  Anesthesia Plan: General   Post-op Pain Management:    Induction:   Airway Management Planned: LMA  Additional Equipment:   Intra-op Plan:   Post-operative Plan: Extubation in OR  Informed Consent: I have reviewed the patients History and Physical, chart, labs and discussed the procedure including the risks, benefits and alternatives for the proposed anesthesia with the patient or authorized representative who has indicated his/her understanding and acceptance.   Dental advisory given  Plan Discussed with: CRNA, Anesthesiologist and Surgeon  Anesthesia Plan Comments:         Anesthesia Quick Evaluation

## 2012-06-07 NOTE — Brief Op Note (Signed)
06/07/2012  2:05 PM  PATIENT:  Michael Lutz  67 y.o. male  PRE-OPERATIVE DIAGNOSIS:  STT ARTHRITIS RIGHT WRIST  POST-OPERATIVE DIAGNOSIS:  Stenosing Tenosynovitis Arthritis Right Wrist  PROCEDURE:  Procedure(s): DISTAL POLE EXCISION RIGHT WRIST (Right)  SURGEON:  Surgeon(s) and Role:    * Nicki Reaper, MD - Primary  PHYSICIAN ASSISTANT:   ASSISTANTS: none   ANESTHESIA:   regional and general  EBL:  Total I/O In: 1000 [I.V.:1000] Out: -   BLOOD ADMINISTERED:none  DRAINS: none   LOCAL MEDICATIONS USED:  NONE  SPECIMEN:  No Specimen  DISPOSITION OF SPECIMEN:  N/A  COUNTS:  YES  TOURNIQUET:   Total Tourniquet Time Documented: Upper Arm (Right) - 37 minutes Total: Upper Arm (Right) - 37 minutes   DICTATION: .Other Dictation: Dictation Number [id number]  PLAN OF CARE: Discharge to home after PACU  PATIENT DISPOSITION:  PACU - hemodynamically stable.

## 2012-06-07 NOTE — H&P (Signed)
Michael Lutz is a 67 year-old left-hand dominant male  with STT arthritis, rupture of his flexor carpi radialis tendon in 2010 of his right wrist.  He is seen today with continued pain in his right wrist.  He has no new history of injury. He states that the injection given by Dr. Teressa Senter several months ago gave him 80% relief of symptoms. He has no new injury. He is wondering if anything further can be done.  He complains of pain with twisting his arm.  He complains of pain on the volar radial aspect of his wrist up through the dorsal aspect, he does not complain of a great deal of swelling.  He has had multiple back fusions by Dr. Alveda Reasons. He has had a CT scan done and this reveals degenerative changes only at the STT joint. Interestingly enough the trapezoid joint has been maintained. He has subluxation of his CMC but he does not have any pain on physical examination. He would like to have something done to this. We have discussed 2 possibilities. In that I would not recommend removal of his trapezium with a suspensionplasty we would recommend either an STT fusion or distal pole excision of the scaphoid. He is aware that the potential for nonhealing of the fusion site, the possibility of collapse of his joint in that he does show some dorsiflexion of his lunate at the present time with instability necessitating a proximal row carpectomy. He would like to proceed with the distal pole excision  ALLERGIES:   None.  MEDICATIONS:  Ranitidine, clonazepam, Effexor XR, clorazepate, hydrochlorate, hydrocodone, Seroquel, amlodipine, meclizine, quinapril, lamotrigine, Lidoderm patch, ibuprofen, simethicone, pseudoephedrine, Provigil, aspirin, Embeda, Klonopin, Norflex ER, Vitamins C/E/super B complex/D3/multi, fish oil, potassium and calcium.  (Detailed list enclosed in chart.)  PAST MEDICAL/SURGICAL HISTORY:   Shingles, spinal fusions.  FAMILY MEDICAL HISTORY:  Positive for diabetes, arthritis.  SOCIAL HISTORY:   He  smokes a pack a day and is advised to quit.  He is going to try.  He has ordered E-cigarettes.  He does not drink.  REVIEW OF SYSTEMS:   Positive for glasses, hearing loss, high blood pressure, depression, otherwise negative.   Michael Lutz is an 67 y.o. male.   Chief Complaint: wrist pain rt djd stt HPI: see above  Past Medical History  Diagnosis Date  . Heart murmur     arrythmia  . AAA (abdominal aortic aneurysm)   . Hyperlipidemia     takes Niacin daily and Lipitor daily  . Joint pain   . Leg pain 05-04-10    with walking  . History of shingles   . Peripheral vascular disease     s/p AAA stent  . Dysrhythmia     palpitations- sees dr Eldridge Dace  . ADD (attention deficit disorder)   . Bipolar affective disorder     in remission on meds  . Chronic back pain     [facility]  . Colon polyps     Dr Matthias Hughs does a colonoscopy every 5 years  . Hypertension     takes Amlodipine and Quinapril daily  . Peripheral edema     takes Furosemide prn  . Hx of seasonal allergies   . Arthritis     left knee  . Chronic back pain     scoliosis  . Hx of scabies Mid Feb 2013  . GERD (gastroesophageal reflux disease)     takes Zantac daily  . Constipation     takes  Colace prn  . Urinary urgency   . Nocturia   . Enlarged prostate     slightly  . Blood transfusion     several times  . Bipolar 1 disorder     takes Seroquel nightly  . Depression     takes Effexor daily  . Insomnia     takes Trazodone nightly  . Anxiety     takes Clonazepam daily  . Memory loss     short and long term  . Full dentures   . Wears glasses     Past Surgical History  Procedure Laterality Date  . Spine surgery  12/2009    Spinal Fusion by Dr. Alveda Reasons  . Abdominal aortic aneurysm repair  11/2009    Stent graft repair  . Foot surgery      Multiple surgeries on right foot  . Shoulder surgery      Right shoulder  . Tonsillectomy    . Testicle surgery      right testicle  .  Turp vaporization    . Cholecystectomy    . Back surgery      thoracic spine  . Spinal cord stimulator implant  2009  . Vascular surgery      AAA stent repair  . Cardiac catheterization      15 yrs ago  . Spinal fusion  04/04/2011    Procedure: FUSION POSTERIOR SPINAL MULTILEVEL/SCOLIOSIS;  Surgeon: Charlsie Quest, MD;  Location: [facility] OR;  Service: Orthopedics;  Laterality: N/A;  repair thoracic and lumbar  pseudoarthrosis, revise spinal cord stimulator, extend thoracic fusion, iliac crest bone graft  . Colonoscopy      Family History  Problem Relation Age of Onset  . Cancer Mother     melanoma  . Diabetes Father   . Arthritis Sister     rheumatoid  . Anesthesia problems Neg Hx   . Hypotension Neg Hx   . Malignant hyperthermia Neg Hx   . Pseudochol deficiency Neg Hx    Social History:  reports that he has been smoking Cigars.  He does not have any smokeless tobacco history on file. He reports that he does not drink alcohol or use illicit drugs.  Allergies: No Known Allergies  Medications Prior to Admission  Medication Sig Dispense Refill  . amLODipine (NORVASC) 5 MG tablet Take 5 mg by mouth daily.        . Ascorbic Acid (VITAMIN C) 1000 MG tablet Take 1,000 mg by mouth daily.      Marland Kitchen aspirin 325 MG tablet Take 162 mg by mouth daily.       Marland Kitchen atorvastatin (LIPITOR) 80 MG tablet Take 80 mg by mouth daily.      Marland Kitchen b complex vitamins tablet Take 1 tablet by mouth every morning.      . calcium carbonate (OS-CAL) 600 MG TABS Take 600 mg by mouth daily.        . cholecalciferol (VITAMIN D) 1000 UNITS tablet Take 2,000 Units by mouth every morning.       . clonazePAM (KLONOPIN) 1 MG tablet Take 2 mg by mouth at bedtime. 2 at night for sleep & 1 during the day for anxiety if needed      . furosemide (LASIX) 40 MG tablet Take 80 mg by mouth as needed. For fluid retention      . gabapentin (NEURONTIN) 300 MG capsule Take 300 mg by mouth every 6 (six) hours.       Marland Kitchen  HYDROcodone-acetaminophen (NORCO)  10-325 MG per tablet Take 1 tablet by mouth every 4 (four) hours as needed. For break through pain.      Marland Kitchen lamoTRIgine (LAMICTAL) 200 MG tablet Take 200 mg by mouth at bedtime.       . magnesium oxide (MAG-OX) 400 MG tablet Take 400 mg by mouth daily.        . methocarbamol (ROBAXIN) 500 MG tablet Take 1,000 mg by mouth 3 (three) times daily.       . Multiple Vitamin (MULTIVITAMIN) tablet Take 1 tablet by mouth daily.        . niacin (NIASPAN) 500 MG CR tablet Take 500 mg by mouth at bedtime.      Marland Kitchen oxycodone (ROXICODONE) 30 MG immediate release tablet Take 30 mg by mouth every 4 (four) hours as needed. For pain      . potassium chloride (K-DUR,KLOR-CON) 10 MEQ tablet Take 10 mEq by mouth 2 (two) times daily.      . pseudoephedrine (SUDAFED) 30 MG tablet Take 30 mg by mouth every 4 (four) hours as needed for congestion.      . QUEtiapine (SEROQUEL) 100 MG tablet Take 300 mg by mouth at bedtime.       . quinapril (ACCUPRIL) 40 MG tablet Take 80 mg by mouth daily.       . ranitidine (ZANTAC) 75 MG tablet Take 75 mg by mouth every morning. Take one daily      . traZODone (DESYREL) 100 MG tablet Take 300-360 mg by mouth at bedtime.       Marland Kitchen venlafaxine (EFFEXOR-XR) 150 MG 24 hr capsule Take 150 mg by mouth every morning.       . vitamin E 1000 UNIT capsule Take 1,000 Units by mouth daily.      Marland Kitchen ibuprofen (ADVIL,MOTRIN) 200 MG tablet Take 200 mg by mouth every 6 (six) hours as needed. For pain      . simethicone (MYLICON) 125 MG chewable tablet Chew 125 mg by mouth daily as needed. For flatulence        Results for orders placed during the hospital encounter of 06/07/12 (from the past 48 hour(s))  BASIC METABOLIC PANEL     Status: Abnormal   Collection Time    06/06/12  3:15 PM      Result Value Range   Sodium 137  135 - 145 mEq/L   Potassium 4.5  3.5 - 5.1 mEq/L   Chloride 101  96 - 112 mEq/L   CO2 26  19 - 32 mEq/L   Glucose, Bld 83  70 - 99 mg/dL   BUN 15   6 - 23 mg/dL   Creatinine, Ser 5.40  0.50 - 1.35 mg/dL   Calcium 9.2  8.4 - 98.1 mg/dL   GFR calc non Af Amer 86 (*) >90 mL/min   GFR calc Af Amer >90  >90 mL/min   Comment:            The eGFR has been calculated     using the CKD EPI equation.     This calculation has not been     validated in all clinical     situations.     eGFR's persistently     <90 mL/min signify     possible Chronic Kidney Disease.    No results found.   Pertinent items are noted in HPI.  Blood pressure 157/106, pulse 88, temperature 98.1 F (36.7 C), temperature source Oral, resp. rate 28, height 5\' 9"  (  1.753 m), weight 137.44 kg (303 lb), SpO2 94.00%.  General appearance: alert, cooperative and appears stated age Head: Normocephalic, without obvious abnormality Neck: no JVD Resp: clear to auscultation bilaterally Cardio: regular rate and rhythm, S1, S2 normal, no murmur, click, rub or gallop GI: soft, non-tender; bowel sounds normal; no masses,  no organomegaly Extremities: extremities normal, atraumatic, no cyanosis or edema Pulses: 2+ and symmetric Skin: Skin color, texture, turgor normal. No rashes or lesions Neurologic: Grossly normal Incision/Wound: na  Assessment/Plan He is scheduled for excision distal pole right scaphoid as an outpatient under regional anesthesia.  The pre, peri and post op course are discussed along with risks and complications.  He is aware there is no guarantee with surgery, possibility of infection, recurrence, injury to arteries, nerves and tendons, incomplete relief of symptoms and dystrophy.  He   Vishnu Moeller R 06/07/2012, 11:55 AM

## 2012-06-07 NOTE — Op Note (Signed)
Dictation Number [id number]

## 2012-06-07 NOTE — Progress Notes (Signed)
Assisted Dr. Ivin Booty with right, ultrasound guided, supraclavicula block. Side rails up, monitors on throughout procedure. See vital signs in flow sheet. Tolerated Procedure well.

## 2012-06-08 NOTE — Op Note (Signed)
NAMETAGG, EUSTICE                ACCOUNT NO.:  [id number]  MEDICAL RECORD NO.:  [id number]  LOCATION:                                 FACILITY:  PHYSICIAN:  Cindee Salt, M.D.            DATE OF BIRTH:  DATE OF PROCEDURE:  06/07/2012 DATE OF DISCHARGE:                              OPERATIVE REPORT   PREOPERATIVE DIAGNOSIS:  Degenerative arthritis, distal pole scaphotrapeziotrapezoid joint, right wrist.  POSTOPERATIVE DIAGNOSIS:  Degenerative arthritis, distal pole scaphotrapeziotrapezoid joint, right wrist.  OPERATION:  Distal pole excision, right scaphoid.  SURGEON:  Cindee Salt, MD  ANESTHESIA:  Supraclavicular block, general.  ANESTHESIOLOGIST:  Sheldon Silvan, M.D.  HISTORY:  The patient is a 67 year old male with a history of degenerative arthritis of the STT joint of his thumb.  He has elected to undergo excision of the distal pole rather than STT fusion.  Pre, peri, and postoperative course have been discussed along with risks and complications.  He is aware there is no guarantee with the surgery, possibility of infection, recurrence of injury to arteries, nerves, tendons, incomplete relief of symptoms, dystrophy.  In the preoperative area, the patient was seen, the extremity was marked by both the patient and surgeon.  Antibiotic given.  DESCRIPTION OF PROCEDURE:  The patient was brought to the operating room where a supraclavicular block and general anesthetic were carried out without difficulty.  He was prepped using ChloraPrep in supine position with the right arm free.  A 3-minute dry time was allowed.  Time-out taken, confirming the patient and procedure.  The limb was exsanguinated with an Esmarch bandage after time-out was taken.  A 3-minute dry time allowed for the ChloraPrep.  The limb was exsanguinated with an Esmarch bandage.  Tourniquet placed high on the arm, was inflated to 250 mmHg. The incision was made on the curvilinear on the volar aspect for an  open reduction and internal fixation of a scaphoid fracture, volar approach, carried down through subcutaneous tissue.  Bleeders were electrocauterized with bipolar.  Radial artery was identified and protected.  Retractors placed.  The distal pole of the scaphoid was then identified, confirmed with image intensification.  This was then relieved of its periosteal tissue, protecting the radioscaphocapitate ligament.  The distal pole was then removed with a rongeur, taking care to protect neurovascular structures.  Total eburnation of the distal pole of this scaphoid articular surface was noted.  Following confirmation of adequate resection with flexion and extension, there was no impingement on the trapezium or trapezoid.  The wound was copiously irrigated with saline.  The volar ligaments were then repaired with figure-of-eight 3-0 FiberWire.  The subcutaneous tissue was then closed with interrupted 4-0 Vicryl sutures and the skin with interrupted 4-0 Vicryl Rapide.  A sterile compressive dressing, dorsal palmar thumb spica splint applied.  On deflation of the tourniquet, all fingers immediately pinked.  He was taken to the recovery room for observation in satisfactory condition.  He will be discharged home to return in 1 week.  He has Percocet at home.          ______________________________ Cindee Salt, M.D.  GK/MEDQ  D:  06/07/2012  T:  06/08/2012  Job:  [id number]

## 2012-06-10 ENCOUNTER — Encounter (HOSPITAL_BASED_OUTPATIENT_CLINIC_OR_DEPARTMENT_OTHER): Payer: Self-pay | Admitting: Orthopedic Surgery

## 2012-06-17 DIAGNOSIS — M19049 Primary osteoarthritis, unspecified hand: Secondary | ICD-10-CM | POA: Diagnosis not present

## 2012-06-19 DIAGNOSIS — I10 Essential (primary) hypertension: Secondary | ICD-10-CM | POA: Diagnosis not present

## 2012-06-19 DIAGNOSIS — M961 Postlaminectomy syndrome, not elsewhere classified: Secondary | ICD-10-CM | POA: Diagnosis not present

## 2012-06-19 DIAGNOSIS — G8918 Other acute postprocedural pain: Secondary | ICD-10-CM | POA: Diagnosis not present

## 2012-06-24 DIAGNOSIS — M19049 Primary osteoarthritis, unspecified hand: Secondary | ICD-10-CM | POA: Diagnosis not present

## 2012-07-08 DIAGNOSIS — F3111 Bipolar disorder, current episode manic without psychotic features, mild: Secondary | ICD-10-CM | POA: Diagnosis not present

## 2012-07-08 DIAGNOSIS — M19049 Primary osteoarthritis, unspecified hand: Secondary | ICD-10-CM | POA: Diagnosis not present

## 2012-07-18 DIAGNOSIS — M47817 Spondylosis without myelopathy or radiculopathy, lumbosacral region: Secondary | ICD-10-CM | POA: Diagnosis not present

## 2012-07-18 DIAGNOSIS — M25539 Pain in unspecified wrist: Secondary | ICD-10-CM | POA: Diagnosis not present

## 2012-07-18 DIAGNOSIS — Z79899 Other long term (current) drug therapy: Secondary | ICD-10-CM | POA: Diagnosis not present

## 2012-07-18 DIAGNOSIS — G894 Chronic pain syndrome: Secondary | ICD-10-CM | POA: Diagnosis not present

## 2012-07-18 DIAGNOSIS — M961 Postlaminectomy syndrome, not elsewhere classified: Secondary | ICD-10-CM | POA: Diagnosis not present

## 2012-08-19 DIAGNOSIS — M47817 Spondylosis without myelopathy or radiculopathy, lumbosacral region: Secondary | ICD-10-CM | POA: Diagnosis not present

## 2012-08-19 DIAGNOSIS — M961 Postlaminectomy syndrome, not elsewhere classified: Secondary | ICD-10-CM | POA: Diagnosis not present

## 2012-08-19 DIAGNOSIS — G894 Chronic pain syndrome: Secondary | ICD-10-CM | POA: Diagnosis not present

## 2012-08-19 DIAGNOSIS — Z5181 Encounter for therapeutic drug level monitoring: Secondary | ICD-10-CM | POA: Diagnosis not present

## 2012-09-12 DIAGNOSIS — M19049 Primary osteoarthritis, unspecified hand: Secondary | ICD-10-CM | POA: Diagnosis not present

## 2012-09-18 DIAGNOSIS — G894 Chronic pain syndrome: Secondary | ICD-10-CM | POA: Diagnosis not present

## 2012-09-18 DIAGNOSIS — M961 Postlaminectomy syndrome, not elsewhere classified: Secondary | ICD-10-CM | POA: Diagnosis not present

## 2012-09-20 DIAGNOSIS — F3111 Bipolar disorder, current episode manic without psychotic features, mild: Secondary | ICD-10-CM | POA: Diagnosis not present

## 2012-09-20 DIAGNOSIS — M545 Low back pain, unspecified: Secondary | ICD-10-CM | POA: Diagnosis not present

## 2012-09-20 DIAGNOSIS — M546 Pain in thoracic spine: Secondary | ICD-10-CM | POA: Diagnosis not present

## 2012-09-20 DIAGNOSIS — S62109A Fracture of unspecified carpal bone, unspecified wrist, initial encounter for closed fracture: Secondary | ICD-10-CM | POA: Diagnosis not present

## 2012-10-03 DIAGNOSIS — M19049 Primary osteoarthritis, unspecified hand: Secondary | ICD-10-CM | POA: Diagnosis not present

## 2012-10-04 DIAGNOSIS — E7800 Pure hypercholesterolemia, unspecified: Secondary | ICD-10-CM | POA: Diagnosis not present

## 2012-10-04 DIAGNOSIS — I10 Essential (primary) hypertension: Secondary | ICD-10-CM | POA: Diagnosis not present

## 2012-10-04 DIAGNOSIS — M545 Low back pain, unspecified: Secondary | ICD-10-CM | POA: Diagnosis not present

## 2012-10-04 DIAGNOSIS — F17200 Nicotine dependence, unspecified, uncomplicated: Secondary | ICD-10-CM | POA: Diagnosis not present

## 2012-10-09 DIAGNOSIS — Z5181 Encounter for therapeutic drug level monitoring: Secondary | ICD-10-CM | POA: Diagnosis not present

## 2012-10-09 DIAGNOSIS — M47817 Spondylosis without myelopathy or radiculopathy, lumbosacral region: Secondary | ICD-10-CM | POA: Diagnosis not present

## 2012-10-09 DIAGNOSIS — G894 Chronic pain syndrome: Secondary | ICD-10-CM | POA: Diagnosis not present

## 2012-10-09 DIAGNOSIS — M961 Postlaminectomy syndrome, not elsewhere classified: Secondary | ICD-10-CM | POA: Diagnosis not present

## 2012-11-06 DIAGNOSIS — Z5181 Encounter for therapeutic drug level monitoring: Secondary | ICD-10-CM | POA: Diagnosis not present

## 2012-11-06 DIAGNOSIS — G894 Chronic pain syndrome: Secondary | ICD-10-CM | POA: Diagnosis not present

## 2012-11-06 DIAGNOSIS — M961 Postlaminectomy syndrome, not elsewhere classified: Secondary | ICD-10-CM | POA: Diagnosis not present

## 2012-12-04 DIAGNOSIS — F411 Generalized anxiety disorder: Secondary | ICD-10-CM | POA: Diagnosis not present

## 2012-12-04 DIAGNOSIS — G894 Chronic pain syndrome: Secondary | ICD-10-CM | POA: Diagnosis not present

## 2012-12-04 DIAGNOSIS — M961 Postlaminectomy syndrome, not elsewhere classified: Secondary | ICD-10-CM | POA: Diagnosis not present

## 2012-12-04 DIAGNOSIS — M47817 Spondylosis without myelopathy or radiculopathy, lumbosacral region: Secondary | ICD-10-CM | POA: Diagnosis not present

## 2012-12-20 DIAGNOSIS — F3111 Bipolar disorder, current episode manic without psychotic features, mild: Secondary | ICD-10-CM | POA: Diagnosis not present

## 2013-01-02 DIAGNOSIS — G894 Chronic pain syndrome: Secondary | ICD-10-CM | POA: Diagnosis not present

## 2013-01-02 DIAGNOSIS — M961 Postlaminectomy syndrome, not elsewhere classified: Secondary | ICD-10-CM | POA: Diagnosis not present

## 2013-01-02 DIAGNOSIS — M47817 Spondylosis without myelopathy or radiculopathy, lumbosacral region: Secondary | ICD-10-CM | POA: Diagnosis not present

## 2013-01-02 DIAGNOSIS — Z5181 Encounter for therapeutic drug level monitoring: Secondary | ICD-10-CM | POA: Diagnosis not present

## 2013-01-06 DIAGNOSIS — R609 Edema, unspecified: Secondary | ICD-10-CM | POA: Diagnosis not present

## 2013-01-06 DIAGNOSIS — M5137 Other intervertebral disc degeneration, lumbosacral region: Secondary | ICD-10-CM | POA: Diagnosis not present

## 2013-01-06 DIAGNOSIS — I10 Essential (primary) hypertension: Secondary | ICD-10-CM | POA: Diagnosis not present

## 2013-01-06 DIAGNOSIS — Z125 Encounter for screening for malignant neoplasm of prostate: Secondary | ICD-10-CM | POA: Diagnosis not present

## 2013-01-06 DIAGNOSIS — F39 Unspecified mood [affective] disorder: Secondary | ICD-10-CM | POA: Diagnosis not present

## 2013-01-06 DIAGNOSIS — F17200 Nicotine dependence, unspecified, uncomplicated: Secondary | ICD-10-CM | POA: Diagnosis not present

## 2013-01-06 DIAGNOSIS — G47419 Narcolepsy without cataplexy: Secondary | ICD-10-CM | POA: Diagnosis not present

## 2013-01-06 DIAGNOSIS — E7800 Pure hypercholesterolemia, unspecified: Secondary | ICD-10-CM | POA: Diagnosis not present

## 2013-01-08 DIAGNOSIS — Z23 Encounter for immunization: Secondary | ICD-10-CM | POA: Diagnosis not present

## 2013-01-29 DIAGNOSIS — M961 Postlaminectomy syndrome, not elsewhere classified: Secondary | ICD-10-CM | POA: Diagnosis not present

## 2013-01-29 DIAGNOSIS — Z5181 Encounter for therapeutic drug level monitoring: Secondary | ICD-10-CM | POA: Diagnosis not present

## 2013-01-29 DIAGNOSIS — G894 Chronic pain syndrome: Secondary | ICD-10-CM | POA: Diagnosis not present

## 2013-03-03 DIAGNOSIS — F411 Generalized anxiety disorder: Secondary | ICD-10-CM | POA: Diagnosis not present

## 2013-03-03 DIAGNOSIS — G894 Chronic pain syndrome: Secondary | ICD-10-CM | POA: Diagnosis not present

## 2013-03-03 DIAGNOSIS — M961 Postlaminectomy syndrome, not elsewhere classified: Secondary | ICD-10-CM | POA: Diagnosis not present

## 2013-03-03 DIAGNOSIS — M47817 Spondylosis without myelopathy or radiculopathy, lumbosacral region: Secondary | ICD-10-CM | POA: Diagnosis not present

## 2013-04-01 DIAGNOSIS — G894 Chronic pain syndrome: Secondary | ICD-10-CM | POA: Diagnosis not present

## 2013-04-01 DIAGNOSIS — F411 Generalized anxiety disorder: Secondary | ICD-10-CM | POA: Diagnosis not present

## 2013-04-01 DIAGNOSIS — M961 Postlaminectomy syndrome, not elsewhere classified: Secondary | ICD-10-CM | POA: Diagnosis not present

## 2013-04-04 DIAGNOSIS — M545 Low back pain, unspecified: Secondary | ICD-10-CM | POA: Diagnosis not present

## 2013-04-04 DIAGNOSIS — M25539 Pain in unspecified wrist: Secondary | ICD-10-CM | POA: Diagnosis not present

## 2013-04-10 DIAGNOSIS — F3111 Bipolar disorder, current episode manic without psychotic features, mild: Secondary | ICD-10-CM | POA: Diagnosis not present

## 2013-04-11 DIAGNOSIS — M961 Postlaminectomy syndrome, not elsewhere classified: Secondary | ICD-10-CM | POA: Insufficient documentation

## 2013-04-11 DIAGNOSIS — E668 Other obesity: Secondary | ICD-10-CM | POA: Insufficient documentation

## 2013-04-11 DIAGNOSIS — Z981 Arthrodesis status: Secondary | ICD-10-CM | POA: Diagnosis not present

## 2013-04-11 DIAGNOSIS — M47817 Spondylosis without myelopathy or radiculopathy, lumbosacral region: Secondary | ICD-10-CM | POA: Diagnosis not present

## 2013-04-11 DIAGNOSIS — M25559 Pain in unspecified hip: Secondary | ICD-10-CM | POA: Insufficient documentation

## 2013-04-11 DIAGNOSIS — F1920 Other psychoactive substance dependence, uncomplicated: Secondary | ICD-10-CM | POA: Insufficient documentation

## 2013-04-15 ENCOUNTER — Other Ambulatory Visit: Payer: Self-pay | Admitting: Orthopaedic Surgery

## 2013-04-15 DIAGNOSIS — M961 Postlaminectomy syndrome, not elsewhere classified: Secondary | ICD-10-CM

## 2013-04-23 ENCOUNTER — Ambulatory Visit
Admission: RE | Admit: 2013-04-23 | Discharge: 2013-04-23 | Disposition: A | Discharge status: Home / Self Care | Payer: Private Health Insurance - Indemnity | Source: Ambulatory Visit | Attending: Orthopaedic Surgery | Admitting: Orthopaedic Surgery

## 2013-04-23 ENCOUNTER — Ambulatory Visit
Admission: RE | Admit: 2013-04-23 | Discharge: 2013-04-23 | Disposition: A | Discharge status: Home / Self Care | Payer: Medicare Other | Source: Ambulatory Visit | Attending: Orthopaedic Surgery | Admitting: Orthopaedic Surgery

## 2013-04-23 VITALS — BP 166/85 | HR 75

## 2013-04-23 DIAGNOSIS — IMO0002 Reserved for concepts with insufficient information to code with codable children: Secondary | ICD-10-CM | POA: Diagnosis not present

## 2013-04-23 DIAGNOSIS — M549 Dorsalgia, unspecified: Secondary | ICD-10-CM | POA: Diagnosis not present

## 2013-04-23 DIAGNOSIS — M961 Postlaminectomy syndrome, not elsewhere classified: Secondary | ICD-10-CM

## 2013-04-23 DIAGNOSIS — M48061 Spinal stenosis, lumbar region without neurogenic claudication: Secondary | ICD-10-CM | POA: Diagnosis not present

## 2013-04-23 MED ORDER — DIAZEPAM 5 MG PO TABS
5.0000 mg | ORAL_TABLET | Freq: Once | ORAL | Status: AC
  Administered 2013-04-23: 5 mg via ORAL

## 2013-04-23 MED ORDER — IOHEXOL 180 MG/ML  SOLN
18.0000 mL | Freq: Once | INTRAMUSCULAR | Status: AC | PRN
  Administered 2013-04-23: 18 mL via INTRATHECAL

## 2013-04-23 NOTE — Discharge Instructions (Signed)
Myelogram Discharge Instructions  1. Go home and rest quietly for the next 24 hours.  It is important to lie flat for the next 24 hours.  Get up only to go to the restroom.  You may lie in the bed or on a couch on your back, your stomach, your left side or your right side.  You may have one pillow under your head.  You may have pillows between your knees while you are on your side or under your knees while you are on your back.  2. DO NOT drive today.  Recline the seat as far back as it will go, while still wearing your seat belt, on the way home.  3. You may get up to go to the bathroom as needed.  You may sit up for 10 minutes to eat.  You may resume your normal diet and medications unless otherwise indicated.  Drink lots of extra fluids today and tomorrow.  4. The incidence of headache, nausea, or vomiting is about 5% (one in 20 patients).  If you develop a headache, lie flat and drink plenty of fluids until the headache goes away.  Caffeinated beverages may be helpful.  If you develop severe nausea and vomiting or a headache that does not go away with flat bed rest, call [phone].  5. You may resume normal activities after your 24 hours of bed rest is over; however, do not exert yourself strongly or do any heavy lifting tomorrow. If when you get up you have a headache when standing, go back to bed and force fluids for another 24 hours.  6. Call your physician for a follow-up appointment.  The results of your myelogram will be sent directly to your physician by the following day.  7. If you have any questions or if complications develop after you arrive home, please call [phone].  Discharge instructions have been explained to the patient.  The patient, or the person responsible for the patient, fully understands these instructions.      May resume Trazodone and Effexor on Feb. 12, 2015, after 1:00 pm.

## 2013-04-23 NOTE — Progress Notes (Signed)
Pt states he has been off trazodone and effexor since Sunday. Discharge instructions explained to pt.

## 2013-05-06 DIAGNOSIS — G894 Chronic pain syndrome: Secondary | ICD-10-CM | POA: Diagnosis not present

## 2013-05-06 DIAGNOSIS — M961 Postlaminectomy syndrome, not elsewhere classified: Secondary | ICD-10-CM | POA: Diagnosis not present

## 2013-05-06 DIAGNOSIS — M47817 Spondylosis without myelopathy or radiculopathy, lumbosacral region: Secondary | ICD-10-CM | POA: Diagnosis not present

## 2013-05-22 DIAGNOSIS — M545 Low back pain, unspecified: Secondary | ICD-10-CM | POA: Insufficient documentation

## 2013-05-22 DIAGNOSIS — M961 Postlaminectomy syndrome, not elsewhere classified: Secondary | ICD-10-CM | POA: Diagnosis not present

## 2013-06-18 ENCOUNTER — Other Ambulatory Visit: Payer: Self-pay | Admitting: *Deleted

## 2013-06-18 DIAGNOSIS — I714 Abdominal aortic aneurysm, without rupture, unspecified: Secondary | ICD-10-CM

## 2013-06-18 DIAGNOSIS — Z48812 Encounter for surgical aftercare following surgery on the circulatory system: Secondary | ICD-10-CM

## 2013-07-01 DIAGNOSIS — G894 Chronic pain syndrome: Secondary | ICD-10-CM | POA: Diagnosis not present

## 2013-07-01 DIAGNOSIS — M961 Postlaminectomy syndrome, not elsewhere classified: Secondary | ICD-10-CM | POA: Diagnosis not present

## 2013-07-02 ENCOUNTER — Encounter: Payer: Self-pay | Admitting: Vascular Surgery

## 2013-07-03 ENCOUNTER — Ambulatory Visit (INDEPENDENT_AMBULATORY_CARE_PROVIDER_SITE_OTHER): Payer: Medicare Other | Admitting: Vascular Surgery

## 2013-07-03 ENCOUNTER — Ambulatory Visit (HOSPITAL_COMMUNITY)
Admission: RE | Admit: 2013-07-03 | Discharge: 2013-07-03 | Disposition: A | Discharge status: Home / Self Care | Payer: Medicare Other | Source: Ambulatory Visit | Attending: Vascular Surgery | Admitting: Vascular Surgery

## 2013-07-03 ENCOUNTER — Encounter: Payer: Self-pay | Admitting: Vascular Surgery

## 2013-07-03 VITALS — BP 138/79 | HR 75 | Ht 69.0 in | Wt 245.0 lb

## 2013-07-03 DIAGNOSIS — Z48812 Encounter for surgical aftercare following surgery on the circulatory system: Secondary | ICD-10-CM

## 2013-07-03 DIAGNOSIS — I714 Abdominal aortic aneurysm, without rupture, unspecified: Secondary | ICD-10-CM | POA: Diagnosis not present

## 2013-07-03 NOTE — Addendum Note (Signed)
Addended by: Dorthula Rue L on: 07/03/2013 04:52 PM   Modules accepted: Orders

## 2013-07-03 NOTE — Progress Notes (Signed)
[facility] [place] HISTORY AND PHYSICAL    History of Present Illness:  Patient is a 68 year old male who presents for follow-up evaluation of AAA. 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 The patient denies new abdominal or back pain.  He has chronic back pain from spine issues.  The patient's atherosclerotic risk factors remain smoking, hyperlipidemia, hypertension.  These are all currently stable and followed by his primary care physician. His aneurysm diameter was proximal 5 cm preoperatively.     Past Medical History   Diagnosis  Date   .  Heart murmur         arrythmia   .  Arthritis     .  Anxiety     .  Depression     .  ADD (attention deficit disorder)     .  AAA (abdominal aortic aneurysm)     .  Hyperlipidemia     .  Hypertension     .  Joint pain     .  Leg pain  05-04-10       with walking   .  History of shingles         Past Surgical History   Procedure  Date   .  Spine surgery  12/2009       Spinal Fusion by Dr. Marcial Pacas   .  Abdominal aortic aneurysm repair  11/2009       Stent graft repair   .  Foot surgery         Multiple surgeries on right foot   .  Shoulder surgery         Right shoulder   .  Tonsillectomy     .  Testicle surgery         right testicle   .  Turp vaporization           Review of Systems:  Neurologic: denies symptoms of TIA, amaurosis, or stroke Cardiac:denies shortness of breath or chest pain Pulmonary: denies cough or wheeze Abdomen: denies abdominal pain nausea or vomiting    History      Social History   .  Marital Status:  Single       Spouse Name:  N/A       Number of Children:  N/A   .  Years of Education:  N/A      Occupational History   .  Not on file.      Social History Main Topics   .  Smoking status:  Current Everyday Smoker -- 0.5 packs/day       Types:  Cigars   .  Smokeless tobacco:  Not on file     Comment: states smokes "little cigars"   .  Alcohol  Use:  No   .  Drug Use:  No   .  Sexually Active:  Not on file      Other Topics  Concern   .  Not on file      Social History Narrative   .  No narrative on file     No Known Allergies  Current Outpatient Prescriptions on File Prior to Visit  Medication Sig Dispense Refill  . amLODipine (NORVASC) 5 MG tablet Take 10 mg by mouth daily.       . Ascorbic Acid (VITAMIN C) 1000 MG tablet Take 500 mg by mouth daily.       Marland Kitchen aspirin 325 MG tablet Take  162 mg by mouth daily.       Marland Kitchen atorvastatin (LIPITOR) 80 MG tablet Take 80 mg by mouth daily.      Marland Kitchen b complex vitamins tablet Take 1 tablet by mouth every morning.      . calcium carbonate (OS-CAL) 600 MG TABS Take 600 mg by mouth daily.        . cholecalciferol (VITAMIN D) 1000 UNITS tablet Take 2,000 Units by mouth every morning.       . clonazePAM (KLONOPIN) 1 MG tablet Take 2 mg by mouth at bedtime. 2 at night for sleep & 1 during the day for anxiety if needed      . furosemide (LASIX) 40 MG tablet Take 80 mg by mouth as needed. For fluid retention      . gabapentin (NEURONTIN) 300 MG capsule Take 300 mg by mouth every 6 (six) hours.       Marland Kitchen ibuprofen (ADVIL,MOTRIN) 200 MG tablet Take 200 mg by mouth every 6 (six) hours as needed. For pain      . magnesium oxide (MAG-OX) 400 MG tablet Take 400 mg by mouth daily.        . methocarbamol (ROBAXIN) 500 MG tablet Take 1,000 mg by mouth 3 (three) times daily.       . Multiple Vitamin (MULTIVITAMIN) tablet Take 1 tablet by mouth daily.        . niacin (NIASPAN) 500 MG CR tablet Take 500 mg by mouth at bedtime.      . potassium chloride (K-DUR,KLOR-CON) 10 MEQ tablet Take 10 mEq by mouth 2 (two) times daily.      . pseudoephedrine (SUDAFED) 30 MG tablet Take 30 mg by mouth every 4 (four) hours as needed for congestion.      . QUEtiapine (SEROQUEL) 100 MG tablet Take 300 mg by mouth at bedtime.       . quinapril (ACCUPRIL) 40 MG tablet Take 80 mg by mouth daily.       . traZODone (DESYREL) 100  MG tablet Take 300-360 mg by mouth at bedtime.       Marland Kitchen venlafaxine (EFFEXOR-XR) 150 MG 24 hr capsule Take 150 mg by mouth every morning.       . vitamin E 1000 UNIT capsule Take 1,000 Units by mouth daily.      Marland Kitchen HYDROcodone-acetaminophen (NORCO) 10-325 MG per tablet Take 1 tablet by mouth every 4 (four) hours as needed. For break through pain.      Marland Kitchen lamoTRIgine (LAMICTAL) 200 MG tablet Take 200 mg by mouth at bedtime.       Marland Kitchen oxycodone (ROXICODONE) 30 MG immediate release tablet Take 30 mg by mouth every 4 (four) hours as needed. For pain      . ranitidine (ZANTAC) 75 MG tablet Take 75 mg by mouth every morning. Take one daily      . simethicone (MYLICON) 259 MG chewable tablet Chew 125 mg by mouth daily as needed. For flatulence       No current facility-administered medications on file prior to visit.   Physical Examination      Filed Vitals:   07/03/13 1054  BP: 138/79  Pulse: 75  Height: 5\' 9"  (1.753 m)  Weight: 245 lb (111.131 kg)  SpO2: 98%   General:  Alert and oriented, no acute distress HEENT: Normal Neck: No bruit or JVD Pulmonary: Clear to auscultation bilaterally Cardiac: Regular Rate and Rhythm without murmur Abdomen: Soft, non-tender, non-distended, normal bowel sounds, no pulsatile  mass Extremities: 2+ femoral pulses   DATA:   Aortic duplex was performed today which shows his aneurysm diameter is currently 4.3 cm. There was no endoleak noted. This is increased from 3.3 cm in 2013. However, the 2013 study was suboptimal..  ASSESSMENT:   Doing well status post Gore Excluder stent graft repair aneurysm.  His aneurysm is 4 .3 cm   PLAN: Patient will return in 6 months to review his stent graft CT angiogram of the abdomen and pelvis to make sure that there is no significant change or endoleak  Ruta Hinds, MD [facility] Office: [phone] Pager: [phone]

## 2013-07-15 DIAGNOSIS — M79609 Pain in unspecified limb: Secondary | ICD-10-CM | POA: Diagnosis not present

## 2013-07-15 DIAGNOSIS — R209 Unspecified disturbances of skin sensation: Secondary | ICD-10-CM | POA: Diagnosis not present

## 2013-07-15 DIAGNOSIS — G729 Myopathy, unspecified: Secondary | ICD-10-CM | POA: Diagnosis not present

## 2013-07-21 DIAGNOSIS — Z23 Encounter for immunization: Secondary | ICD-10-CM | POA: Diagnosis not present

## 2013-07-21 DIAGNOSIS — Z0000 Encounter for general adult medical examination without abnormal findings: Secondary | ICD-10-CM | POA: Diagnosis not present

## 2013-07-29 DIAGNOSIS — G894 Chronic pain syndrome: Secondary | ICD-10-CM | POA: Diagnosis not present

## 2013-07-29 DIAGNOSIS — F411 Generalized anxiety disorder: Secondary | ICD-10-CM | POA: Diagnosis not present

## 2013-07-29 DIAGNOSIS — M961 Postlaminectomy syndrome, not elsewhere classified: Secondary | ICD-10-CM | POA: Diagnosis not present

## 2013-07-31 ENCOUNTER — Ambulatory Visit (INDEPENDENT_AMBULATORY_CARE_PROVIDER_SITE_OTHER): Payer: Medicare Other | Admitting: Neurology

## 2013-07-31 ENCOUNTER — Encounter: Payer: Self-pay | Admitting: Neurology

## 2013-07-31 VITALS — BP 139/87 | HR 82 | Ht 69.5 in | Wt 248.0 lb

## 2013-07-31 DIAGNOSIS — M545 Low back pain, unspecified: Secondary | ICD-10-CM

## 2013-07-31 DIAGNOSIS — G8929 Other chronic pain: Secondary | ICD-10-CM

## 2013-07-31 HISTORY — DX: Other chronic pain: G89.29

## 2013-07-31 HISTORY — DX: Low back pain, unspecified: M54.50

## 2013-07-31 NOTE — Progress Notes (Signed)
Reason for visit: Back pain, leg pain  Michael Lutz is a 68 y.o. male  History of present illness:  Michael Lutz is a 68 year old left-handed white male with a history of chronic low back pain. The patient has had 3 different surgical procedures on the back, and he has had extensive surgery with Harrington rods placed. The patient has chronic back pain, but 3 or 4 months ago, he began having onset of a different type of pain. He indicated that when he would roll on the right side or on the left side, he would develop almost immediate pain going from the back into the hip and down the leg to the ankles. When the patient would roll on his back, and the pain would dissipate within 10-15 seconds. The patient has had to sleep in a recliner, but many nights he only gets 3 or 4 hours of sleep. He indicates that if he is up moving around, he feels better. The patient walks with a cane. He denies any new weakness of the legs, he denies numbness in the legs. He denies problems controlling the bowels or the bladder. He has a spinal stimulator in place, and he is on a combination of gabapentin and Lyrica without complete benefit with this pain. He takes hydrocodone if needed. He does have some mild gait instability, but no recent falls. The patient is referred to this office for an evaluation.  Past Medical History  Diagnosis Date  . Heart murmur     arrythmia  . AAA (abdominal aortic aneurysm)   . Hyperlipidemia     takes Niacin daily and Lipitor daily  . Joint pain   . Leg pain 05-04-10    with walking  . History of shingles   . Peripheral vascular disease     s/p AAA stent  . Dysrhythmia     palpitations- sees dr Irish Lack  . ADD (attention deficit disorder)   . Bipolar affective disorder     in remission on meds  . Chronic back pain     [facility]  . Colon polyps     Dr Cristina Gong does a colonoscopy every 5 years  . Hypertension     takes Amlodipine and Quinapril daily  .  Peripheral edema     takes Furosemide prn  . Hx of seasonal allergies   . Arthritis     left knee  . Chronic back pain     scoliosis  . Hx of scabies Mid Feb 2013  . GERD (gastroesophageal reflux disease)     takes Zantac daily  . Constipation     takes Colace prn  . Urinary urgency   . Nocturia   . Enlarged prostate     slightly  . Blood transfusion     several times  . Bipolar 1 disorder     takes Seroquel nightly  . Depression     takes Effexor daily  . Insomnia     takes Trazodone nightly  . Anxiety     takes Clonazepam daily  . Memory loss     short and long term  . Full dentures   . Wears glasses   . CHF (congestive heart failure)   . Chronic low back pain 07/31/2013    Past Surgical History  Procedure Laterality Date  . Spine surgery  12/2009    Spinal Fusion by Dr. Marcial Pacas  . Abdominal aortic aneurysm repair  11/2009    Stent graft repair  .  Foot surgery      Multiple surgeries on right foot  . Shoulder surgery      Right shoulder  . Tonsillectomy    . Testicle surgery      right testicle  . Turp vaporization    . Cholecystectomy    . Back surgery      thoracic spine  . Spinal cord stimulator implant  2009  . Vascular surgery      AAA stent repair  . Cardiac catheterization      15 yrs ago  . Spinal fusion  04/04/2011    Procedure: FUSION POSTERIOR SPINAL MULTILEVEL/SCOLIOSIS;  Surgeon: Gunnar Bulla, MD;  Location: [place];  Service: Orthopedics;  Laterality: N/A;  repair thoracic and lumbar  pseudoarthrosis, revise spinal cord stimulator, extend thoracic fusion, iliac crest bone graft  . Colonoscopy    . Ganglion cyst excision Right 06/07/2012    Procedure: DISTAL POLE EXCISION RIGHT WRIST;  Surgeon: Wynonia Sours, MD;  Location: [place];  Service: Orthopedics;  Laterality: Right;    Family History  Problem Relation Age of Onset  . Cancer Mother     melanoma  . Diabetes Father   . Heart attack Father   . Arthritis Sister      rheumatoid  . Cancer Sister   . Anesthesia problems Neg Hx   . Hypotension Neg Hx   . Malignant hyperthermia Neg Hx   . Pseudochol deficiency Neg Hx     Social history:  reports that he has been smoking Cigars.  He does not have any smokeless tobacco history on file. He reports that he does not drink alcohol or use illicit drugs.  Medications:  Current Outpatient Prescriptions on File Prior to Visit  Medication Sig Dispense Refill  . amLODipine (NORVASC) 5 MG tablet Take 10 mg by mouth daily.       . Ascorbic Acid (VITAMIN C) 1000 MG tablet Take 500 mg by mouth daily.       Marland Kitchen aspirin 325 MG tablet Take 162 mg by mouth daily.       Marland Kitchen atorvastatin (LIPITOR) 80 MG tablet Take 80 mg by mouth daily.      Marland Kitchen b complex vitamins tablet Take 1 tablet by mouth every morning.      . calcium carbonate (OS-CAL) 600 MG TABS Take 600 mg by mouth daily.        . cholecalciferol (VITAMIN D) 1000 UNITS tablet Take 2,000 Units by mouth every morning.       . clonazePAM (KLONOPIN) 1 MG tablet Take 2 mg by mouth at bedtime. 2 at night for sleep & 1 during the day for anxiety if needed      . furosemide (LASIX) 40 MG tablet Take 80 mg by mouth as needed. For fluid retention      . ibuprofen (ADVIL,MOTRIN) 200 MG tablet Take 200 mg by mouth every 6 (six) hours as needed. For pain      . magnesium oxide (MAG-OX) 400 MG tablet Take 400 mg by mouth daily.        . methocarbamol (ROBAXIN) 500 MG tablet Take 1,000 mg by mouth 3 (three) times daily.       . Multiple Vitamin (MULTIVITAMIN) tablet Take 1 tablet by mouth daily.        . niacin (NIASPAN) 500 MG CR tablet Take 500 mg by mouth at bedtime.      . potassium chloride (K-DUR,KLOR-CON) 10 MEQ tablet Take 10 mEq  by mouth 2 (two) times daily.      . pregabalin (LYRICA) 100 MG capsule Take 100 mg by mouth 3 (three) times daily.       . pseudoephedrine (SUDAFED) 30 MG tablet Take 30 mg by mouth every 4 (four) hours as needed for congestion.      . QUEtiapine  (SEROQUEL) 100 MG tablet Take 300 mg by mouth at bedtime.       . quinapril (ACCUPRIL) 40 MG tablet Take 80 mg by mouth daily.       . traZODone (DESYREL) 100 MG tablet Take 400 mg by mouth at bedtime.       Marland Kitchen venlafaxine (EFFEXOR-XR) 150 MG 24 hr capsule Take 150 mg by mouth every morning.       . vitamin E 1000 UNIT capsule Take 1,000 Units by mouth daily.       No current facility-administered medications on file prior to visit.     No Known Allergies  ROS:  Out of a complete 14 system review of symptoms, the patient complains only of the following symptoms, and all other reviewed systems are negative.  Birthmarks Impotence Muscle cramps, achy muscles Allergies, runny nose Memory loss, confusion Depression, anxiety, too much sleep, decreased energy   Blood pressure 139/87, pulse 82, height 5' 9.5" (1.765 m), weight 248 lb (112.492 kg).  Physical Exam  General: The patient is alert and cooperative at the time of the examination.  Eyes: Pupils are equal, round, and reactive to light. Discs are flat bilaterally.  Neck: The neck is supple, no carotid bruits are noted.  Respiratory: The respiratory examination is clear.  Cardiovascular: The cardiovascular examination reveals a regular rate and rhythm, no obvious murmurs or rubs are noted.  Neuromuscular: The patient is able to flex the low back to about 70.  Skin: Extremities are with 1+ edema at the ankles bilaterally.  Neurologic Exam  Mental status: The patient is alert and oriented x 3 at the time of the examination. The patient has apparent normal recent and remote memory, with an apparently normal attention span and concentration ability.  Cranial nerves: Facial symmetry is present. There is good sensation of the face to pinprick and soft touch bilaterally. The strength of the facial muscles and the muscles to head turning and shoulder shrug are normal bilaterally. Speech is well enunciated, no aphasia or dysarthria  is noted. Extraocular movements are full. Visual fields are full. The tongue is midline, and the patient has symmetric elevation of the soft palate. No obvious hearing deficits are noted.  Motor: The motor testing reveals 5 over 5 strength of all 4 extremities. Good symmetric motor tone is noted throughout. The patient is able to walk on the toes bilaterally, has difficulty walking on heels bilaterally.  Sensory: Sensory testing is intact to pinprick, soft touch, vibration sensation, and position sense on all 4 extremities. No evidence of extinction is noted.  Coordination: Cerebellar testing reveals good finger-nose-finger and heel-to-shin bilaterally.  Gait and station: Gait is normal. Tandem gait is slightly unsteady. Romberg is negative. No drift is seen.  Reflexes: Deep tendon reflexes are symmetric and normal bilaterally. The ankle jerk reflexes are well-maintained bilaterally. Toes are downgoing bilaterally.   CT lumbar spine 04/23/2013:  IMPRESSION:  1. Extensive posterior spinal fixation as above with progressive  posterior and interbody osseous fusion as above. Mild lucency  suggestive of loosening involving the right S1 screw does not appear  significantly changed from prior CT.  2. No  evidence of thoracic or lumbar spinal canal stenosis.  3. Mild left neural foraminal narrowing at L3-4, L4-5, and L5-S1 due  to foraminal osteophytes. Left lateral recess stenosis at L4-5 due  to disc osteophyte complex.    Assessment/Plan:  1. Chronic low back pain  2. Mild gait instability  The patient has chronic low back pain, he is currently managed through Dr. Nicholaus Bloom. He has a spinal stimulator in place which is not effective for his back pain at this time. Within the last 3 or 4 months, he has developed a new pain that is associated with positioning himself on either the right or left side while lying down. The pain goes away rapidly with repositioning. This is likely a  mechanical source pain, not associated with nerve or nerve root compression. Recent CT/myelogram of the lumbosacral spine did not show recurring spinal stenosis or nerve root impingement. Lateral forces on the hips bring on the pain, and I would wonder whether the pain may be induced from an SI joint source. I would consider getting injections in the SI joints to see if this improves his discomfort. The patient is not sleeping well currently because of this issue. The patient will followup through this office if needed.  Jill Alexanders MD 07/31/2013 8:58 PM  [facility] [address] [place], [place] [address]  Phone [phone] Fax [phone]

## 2013-07-31 NOTE — Patient Instructions (Signed)
Back Pain, Adult Low back pain is very common. About 1 in 5 people have back pain.The cause of low back pain is rarely dangerous. The pain often gets better over time.About half of people with a sudden onset of back pain feel better in just 2 weeks. About 8 in 10 people feel better by 6 weeks.  CAUSES Some common causes of back pain include:  Strain of the muscles or ligaments supporting the spine.  Wear and tear (degeneration) of the spinal discs.  Arthritis.  Direct injury to the back. DIAGNOSIS Most of the time, the direct cause of low back pain is not known.However, back pain can be treated effectively even when the exact cause of the pain is unknown.Answering your caregiver's questions about your overall health and symptoms is one of the most accurate ways to make sure the cause of your pain is not dangerous. If your caregiver needs more information, he or she may order lab work or imaging tests (X-rays or MRIs).However, even if imaging tests show changes in your back, this usually does not require surgery. HOME CARE INSTRUCTIONS For many people, back pain returns.Since low back pain is rarely dangerous, it is often a condition that people can learn to manageon their own.   Remain active. It is stressful on the back to sit or stand in one place. Do not sit, drive, or stand in one place for more than 30 minutes at a time. Take short walks on level surfaces as soon as pain allows.Try to increase the length of time you walk each day.  Do not stay in bed.Resting more than 1 or 2 days can delay your recovery.  Do not avoid exercise or work.Your body is made to move.It is not dangerous to be active, even though your back may hurt.Your back will likely heal faster if you return to being active before your pain is gone.  Pay attention to your body when you bend and lift. Many people have less discomfortwhen lifting if they bend their knees, keep the load close to their bodies,and  avoid twisting. Often, the most comfortable positions are those that put less stress on your recovering back.  Find a comfortable position to sleep. Use a firm mattress and lie on your side with your knees slightly bent. If you lie on your back, put a pillow under your knees.  Only take over-the-counter or prescription medicines as directed by your caregiver. Over-the-counter medicines to reduce pain and inflammation are often the most helpful.Your caregiver may prescribe muscle relaxant drugs.These medicines help dull your pain so you can more quickly return to your normal activities and healthy exercise.  Put ice on the injured area.  Put ice in a plastic bag.  Place a towel between your skin and the bag.  Leave the ice on for 15-20 minutes, 03-04 times a day for the first 2 to 3 days. After that, ice and heat may be alternated to reduce pain and spasms.  Ask your caregiver about trying back exercises and gentle massage. This may be of some benefit.  Avoid feeling anxious or stressed.Stress increases muscle tension and can worsen back pain.It is important to recognize when you are anxious or stressed and learn ways to manage it.Exercise is a great option. SEEK MEDICAL CARE IF:  You have pain that is not relieved with rest or medicine.  You have pain that does not improve in 1 week.  You have new symptoms.  You are generally not feeling well. SEEK   IMMEDIATE MEDICAL CARE IF:   You have pain that radiates from your back into your legs.  You develop new bowel or bladder control problems.  You have unusual weakness or numbness in your arms or legs.  You develop nausea or vomiting.  You develop abdominal pain.  You feel faint. Document Released: 02/27/2005 Document Revised: 08/29/2011 Document Reviewed: 07/18/2010 Exit[employer] Patient Information 2014 [employer].  

## 2013-08-26 DIAGNOSIS — G894 Chronic pain syndrome: Secondary | ICD-10-CM | POA: Diagnosis not present

## 2013-08-26 DIAGNOSIS — F411 Generalized anxiety disorder: Secondary | ICD-10-CM | POA: Diagnosis not present

## 2013-08-26 DIAGNOSIS — M961 Postlaminectomy syndrome, not elsewhere classified: Secondary | ICD-10-CM | POA: Diagnosis not present

## 2013-09-22 DIAGNOSIS — F3111 Bipolar disorder, current episode manic without psychotic features, mild: Secondary | ICD-10-CM | POA: Diagnosis not present

## 2013-09-24 ENCOUNTER — Encounter: Payer: Self-pay | Admitting: *Deleted

## 2013-10-07 DIAGNOSIS — M961 Postlaminectomy syndrome, not elsewhere classified: Secondary | ICD-10-CM | POA: Diagnosis not present

## 2013-10-07 DIAGNOSIS — F411 Generalized anxiety disorder: Secondary | ICD-10-CM | POA: Diagnosis not present

## 2013-10-07 DIAGNOSIS — G894 Chronic pain syndrome: Secondary | ICD-10-CM | POA: Diagnosis not present

## 2013-10-09 DIAGNOSIS — M961 Postlaminectomy syndrome, not elsewhere classified: Secondary | ICD-10-CM | POA: Diagnosis not present

## 2013-10-09 DIAGNOSIS — G894 Chronic pain syndrome: Secondary | ICD-10-CM | POA: Diagnosis not present

## 2013-11-05 DIAGNOSIS — G894 Chronic pain syndrome: Secondary | ICD-10-CM | POA: Diagnosis not present

## 2013-11-05 DIAGNOSIS — M961 Postlaminectomy syndrome, not elsewhere classified: Secondary | ICD-10-CM | POA: Diagnosis not present

## 2013-11-05 DIAGNOSIS — F411 Generalized anxiety disorder: Secondary | ICD-10-CM | POA: Diagnosis not present

## 2013-12-06 DIAGNOSIS — Z23 Encounter for immunization: Secondary | ICD-10-CM | POA: Diagnosis not present

## 2013-12-08 DIAGNOSIS — F411 Generalized anxiety disorder: Secondary | ICD-10-CM | POA: Diagnosis not present

## 2013-12-08 DIAGNOSIS — G894 Chronic pain syndrome: Secondary | ICD-10-CM | POA: Diagnosis not present

## 2013-12-08 DIAGNOSIS — M961 Postlaminectomy syndrome, not elsewhere classified: Secondary | ICD-10-CM | POA: Diagnosis not present

## 2014-01-01 DIAGNOSIS — M791 Myalgia: Secondary | ICD-10-CM | POA: Diagnosis not present

## 2014-01-02 DIAGNOSIS — M5431 Sciatica, right side: Secondary | ICD-10-CM | POA: Diagnosis not present

## 2014-01-02 DIAGNOSIS — Z6834 Body mass index (BMI) 34.0-34.9, adult: Secondary | ICD-10-CM | POA: Diagnosis not present

## 2014-01-02 DIAGNOSIS — M545 Low back pain: Secondary | ICD-10-CM | POA: Diagnosis not present

## 2014-01-02 DIAGNOSIS — I10 Essential (primary) hypertension: Secondary | ICD-10-CM | POA: Diagnosis present

## 2014-01-02 DIAGNOSIS — G894 Chronic pain syndrome: Secondary | ICD-10-CM | POA: Diagnosis not present

## 2014-01-05 DIAGNOSIS — Z79891 Long term (current) use of opiate analgesic: Secondary | ICD-10-CM | POA: Diagnosis not present

## 2014-01-05 DIAGNOSIS — G894 Chronic pain syndrome: Secondary | ICD-10-CM | POA: Diagnosis not present

## 2014-01-05 DIAGNOSIS — M961 Postlaminectomy syndrome, not elsewhere classified: Secondary | ICD-10-CM | POA: Diagnosis not present

## 2014-01-05 DIAGNOSIS — F4322 Adjustment disorder with anxiety: Secondary | ICD-10-CM | POA: Diagnosis not present

## 2014-01-07 ENCOUNTER — Other Ambulatory Visit: Payer: Self-pay | Admitting: *Deleted

## 2014-01-07 ENCOUNTER — Encounter: Payer: Self-pay | Admitting: Vascular Surgery

## 2014-01-07 DIAGNOSIS — Z01818 Encounter for other preprocedural examination: Secondary | ICD-10-CM

## 2014-01-07 DIAGNOSIS — Z48812 Encounter for surgical aftercare following surgery on the circulatory system: Secondary | ICD-10-CM | POA: Diagnosis not present

## 2014-01-07 LAB — BUN: BUN: 16 mg/dL (ref 6–23)

## 2014-01-07 LAB — CREATININE, SERUM: Creat: 0.8 mg/dL (ref 0.50–1.35)

## 2014-01-08 ENCOUNTER — Ambulatory Visit
Admission: RE | Admit: 2014-01-08 | Discharge: 2014-01-08 | Disposition: A | Discharge status: Home / Self Care | Payer: Medicare Other | Source: Ambulatory Visit | Attending: Vascular Surgery | Admitting: Vascular Surgery

## 2014-01-08 ENCOUNTER — Encounter: Payer: Self-pay | Admitting: Vascular Surgery

## 2014-01-08 ENCOUNTER — Ambulatory Visit (INDEPENDENT_AMBULATORY_CARE_PROVIDER_SITE_OTHER): Payer: Managed Care, Other (non HMO) | Admitting: Vascular Surgery

## 2014-01-08 VITALS — BP 156/61 | HR 63 | Ht 69.5 in | Wt 246.0 lb

## 2014-01-08 DIAGNOSIS — I714 Abdominal aortic aneurysm, without rupture, unspecified: Secondary | ICD-10-CM

## 2014-01-08 DIAGNOSIS — Z95828 Presence of other vascular implants and grafts: Secondary | ICD-10-CM | POA: Diagnosis not present

## 2014-01-08 DIAGNOSIS — Z48812 Encounter for surgical aftercare following surgery on the circulatory system: Secondary | ICD-10-CM

## 2014-01-08 MED ORDER — IOHEXOL 350 MG/ML SOLN
75.0000 mL | Freq: Once | INTRAVENOUS | Status: AC | PRN
Start: 2014-01-08 — End: 2014-01-08
  Administered 2014-01-08: 75 mL via INTRAVENOUS

## 2014-01-08 NOTE — Progress Notes (Signed)
[facility] [place] HISTORY AND PHYSICAL    History of Present Illness:  Patient is a 68 year old male who presents for follow-up evaluation of AAA. 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 The patient denies new abdominal or back pain.  He has chronic back pain from spine issues.  The patient's atherosclerotic risk factors remain smoking, hyperlipidemia, hypertension.  These are all currently stable and followed by his primary care physician. His aneurysm diameter was proximal 5 cm preoperatively.      Past Medical History    Diagnosis   Date    .   Heart murmur             arrythmia    .   Arthritis       .   Anxiety       .   Depression       .   ADD (attention deficit disorder)       .   AAA (abdominal aortic aneurysm)       .   Hyperlipidemia       .   Hypertension       .   Joint pain       .   Leg pain   05-04-10          with walking    .   History of shingles           Past Surgical History    Procedure   Date    .   Spine surgery   12/2009          Spinal Fusion by Dr. Marcial Pacas    .   Abdominal aortic aneurysm repair   11/2009          Stent graft repair    .   Foot surgery             Multiple surgeries on right foot    .   Shoulder surgery             Right shoulder    .   Tonsillectomy       .   Testicle surgery             right testicle    .   Turp vaporization             Review of Systems:  Neurologic: denies symptoms of TIA, amaurosis, or stroke Cardiac:denies shortness of breath or chest pain Pulmonary: denies cough or wheeze Abdomen: denies abdominal pain nausea or vomiting    History       Social History    .   Marital Status:   Single          Spouse Name:   N/A          Number of Children:   N/A    .   Years of Education:   N/A       Occupational History    .   Not on file.       Social History Main Topics    .   Smoking status:   Current Everyday Smoker -- 0.5 packs/day          Types:    Cigars    .   Smokeless tobacco:   Not on file       Comment: states smokes "little cigars"    .   Alcohol Use:   No    .  Drug Use:   No    .   Sexually Active:   Not on file       Other Topics   Concern    .   Not on file       Social History Narrative    .   No narrative on file      No Known Allergies    Current Outpatient Prescriptions on File Prior to Visit  Medication Sig Dispense Refill  . amLODipine (NORVASC) 5 MG tablet Take 10 mg by mouth daily.       . Ascorbic Acid (VITAMIN C) 1000 MG tablet Take 500 mg by mouth daily.       Marland Kitchen aspirin 325 MG tablet Take 162 mg by mouth daily.       Marland Kitchen atorvastatin (LIPITOR) 80 MG tablet Take 80 mg by mouth daily.      Marland Kitchen b complex vitamins tablet Take 1 tablet by mouth every morning.      . calcium carbonate (OS-CAL) 600 MG TABS Take 600 mg by mouth daily.        . cholecalciferol (VITAMIN D) 1000 UNITS tablet Take 2,000 Units by mouth every morning.       . clonazePAM (KLONOPIN) 1 MG tablet Take 2 mg by mouth at bedtime. 2 at night for sleep & 1 during the day for anxiety if needed      . fentaNYL (DURAGESIC - DOSED MCG/HR) 100 MCG/HR Place 1 mcg onto the skin daily.      . furosemide (LASIX) 40 MG tablet Take 80 mg by mouth as needed. For fluid retention      . gabapentin (NEURONTIN) 800 MG tablet Take 800 mg by mouth 3 (three) times daily.      Marland Kitchen ibuprofen (ADVIL,MOTRIN) 200 MG tablet Take 200 mg by mouth every 6 (six) hours as needed. For pain      . magnesium oxide (MAG-OX) 400 MG tablet Take 400 mg by mouth daily.        . methocarbamol (ROBAXIN) 500 MG tablet Take 1,000 mg by mouth 3 (three) times daily.       . Multiple Vitamin (MULTIVITAMIN) tablet Take 1 tablet by mouth daily.        . niacin (NIASPAN) 500 MG CR tablet Take 500 mg by mouth at bedtime.      . potassium chloride (K-DUR,KLOR-CON) 10 MEQ tablet Take 10 mEq by mouth 2 (two) times daily.      . pregabalin (LYRICA) 100 MG capsule Take 100 mg by mouth 3 (three)  times daily.       . pseudoephedrine (SUDAFED) 30 MG tablet Take 30 mg by mouth every 4 (four) hours as needed for congestion.      . QUEtiapine (SEROQUEL) 100 MG tablet Take 300 mg by mouth at bedtime.       . quinapril (ACCUPRIL) 40 MG tablet Take 80 mg by mouth daily.       . traZODone (DESYREL) 100 MG tablet Take 400 mg by mouth at bedtime.       Marland Kitchen venlafaxine (EFFEXOR-XR) 150 MG 24 hr capsule Take 150 mg by mouth every morning.       . vitamin E 1000 UNIT capsule Take 1,000 Units by mouth daily.       No current facility-administered medications on file prior to visit.    Physical Examination         Filed Vitals:   01/08/14 1555  BP: 156/61  Pulse: 63  Height: 5' 9.5" (1.765 m)  Weight: 246 lb (111.585 kg)  SpO2: 95%   General:  Alert and oriented, no acute distress HEENT: Normal Neck: No bruit or JVD Pulmonary: Clear to auscultation bilaterally Cardiac: Regular Rate and Rhythm without murmur Abdomen: Soft, non-tender, non-distended, normal bowel sounds, no pulsatile mass Extremities: 2+ femoral pulses   DATA:   CT angiogram of the abdomen and pelvis was reviewed today. This shows complete contraction and resolution of the aneurysm with a patent aneurysm stent graft and no evidence of migration.   ASSESSMENT:   Doing well status post Gore Excluder stent graft repair aneurysm.  His aneurysm is 4 .3 cm   PLAN: Patient will return in 1 year with an aortic ultrasound in our office  Ruta Hinds, MD [facility] [place] Office: [phone] Pager: [phone]

## 2014-01-09 ENCOUNTER — Encounter: Payer: Self-pay | Admitting: Vascular Surgery

## 2014-01-09 NOTE — Addendum Note (Signed)
Addended by: Dorthula Rue L on: 01/09/2014 11:18 AM   Modules accepted: Orders

## 2014-01-20 ENCOUNTER — Encounter: Payer: Self-pay | Admitting: Neurology

## 2014-01-20 ENCOUNTER — Ambulatory Visit (INDEPENDENT_AMBULATORY_CARE_PROVIDER_SITE_OTHER): Payer: Private Health Insurance - Indemnity | Admitting: Neurology

## 2014-01-20 VITALS — BP 148/83 | HR 81 | Ht 69.0 in | Wt 246.2 lb

## 2014-01-20 DIAGNOSIS — G8929 Other chronic pain: Secondary | ICD-10-CM | POA: Diagnosis not present

## 2014-01-20 DIAGNOSIS — M545 Low back pain, unspecified: Secondary | ICD-10-CM

## 2014-01-20 NOTE — Patient Instructions (Signed)
Back Pain, Adult Low back pain is very common. About 1 in 5 people have back pain.The cause of low back pain is rarely dangerous. The pain often gets better over time.About half of people with a sudden onset of back pain feel better in just 2 weeks. About 8 in 10 people feel better by 6 weeks.  CAUSES Some common causes of back pain include:  Strain of the muscles or ligaments supporting the spine.  Wear and tear (degeneration) of the spinal discs.  Arthritis.  Direct injury to the back. DIAGNOSIS Most of the time, the direct cause of low back pain is not known.However, back pain can be treated effectively even when the exact cause of the pain is unknown.Answering your caregiver's questions about your overall health and symptoms is one of the most accurate ways to make sure the cause of your pain is not dangerous. If your caregiver needs more information, he or she may order lab work or imaging tests (X-rays or MRIs).However, even if imaging tests show changes in your back, this usually does not require surgery. HOME CARE INSTRUCTIONS For many people, back pain returns.Since low back pain is rarely dangerous, it is often a condition that people can learn to manageon their own.   Remain active. It is stressful on the back to sit or stand in one place. Do not sit, drive, or stand in one place for more than 30 minutes at a time. Take short walks on level surfaces as soon as pain allows.Try to increase the length of time you walk each day.  Do not stay in bed.Resting more than 1 or 2 days can delay your recovery.  Do not avoid exercise or work.Your body is made to move.It is not dangerous to be active, even though your back may hurt.Your back will likely heal faster if you return to being active before your pain is gone.  Pay attention to your body when you bend and lift. Many people have less discomfortwhen lifting if they bend their knees, keep the load close to their bodies,and  avoid twisting. Often, the most comfortable positions are those that put less stress on your recovering back.  Find a comfortable position to sleep. Use a firm mattress and lie on your side with your knees slightly bent. If you lie on your back, put a pillow under your knees.  Only take over-the-counter or prescription medicines as directed by your caregiver. Over-the-counter medicines to reduce pain and inflammation are often the most helpful.Your caregiver may prescribe muscle relaxant drugs.These medicines help dull your pain so you can more quickly return to your normal activities and healthy exercise.  Put ice on the injured area.  Put ice in a plastic bag.  Place a towel between your skin and the bag.  Leave the ice on for 15-20 minutes, 03-04 times a day for the first 2 to 3 days. After that, ice and heat may be alternated to reduce pain and spasms.  Ask your caregiver about trying back exercises and gentle massage. This may be of some benefit.  Avoid feeling anxious or stressed.Stress increases muscle tension and can worsen back pain.It is important to recognize when you are anxious or stressed and learn ways to manage it.Exercise is a great option. SEEK MEDICAL CARE IF:  You have pain that is not relieved with rest or medicine.  You have pain that does not improve in 1 week.  You have new symptoms.  You are generally not feeling well. SEEK   IMMEDIATE MEDICAL CARE IF:   You have pain that radiates from your back into your legs.  You develop new bowel or bladder control problems.  You have unusual weakness or numbness in your arms or legs.  You develop nausea or vomiting.  You develop abdominal pain.  You feel faint. Document Released: 02/27/2005 Document Revised: 08/29/2011 Document Reviewed: 07/01/2013 [employer] Patient Information 2015 [employer]. This information is not intended to replace advice given to you by your health care provider. Make sure you  discuss any questions you have with your health care provider.  

## 2014-01-20 NOTE — Progress Notes (Signed)
Reason for visit: back pain  Michael Lutz is a 68 y.o. male  History of present illness:  History 07/31/13: Michael Lutz is a 68 year old left-handed white male with a history of chronic low back pain. The patient has had 3 different surgical procedures on the back, and he has had extensive surgery with Harrington rods placed. The patient has chronic back pain, but 3 or 4 months ago, he began having onset of a different type of pain. He indicated that when he would roll on the right side or on the left side, he would develop almost immediate pain going from the back into the hip and down the leg to the ankles. When the patient would roll on his back, and the pain would dissipate within 10-15 seconds. The patient has had to sleep in a recliner, but many nights he only gets 3 or 4 hours of sleep. He indicates that if he is up moving around, he feels better. The patient walks with a cane. He denies any new weakness of the legs, he denies numbness in the legs. He denies problems controlling the bowels or the bladder. He has a spinal stimulator in place, and he is on a combination of gabapentin and Lyrica without complete benefit with this pain. He takes hydrocodone if needed. He does have some mild gait instability, but no recent falls.   01/20/14: Michael Lutz returns today for reevaluation for his back pain. The patient is continuing to complain of similar pain as he had noted in May 2015. The patient indicates that he generally has very little pain until he goes to bed at night. If he rolls on one side or the other, he will have pain going all the way down the leg to the foot. The patient indicates that this pain has gradually become more significant over time. The patient believes that there may be some slight weakness of the legs. He denies any issues controlling the bowels or the bladder. The patient does report some gait instability, and he has been using a cane for over 5 years. The patient is on a  multitude of pain medications, and he has a spinal stimulator in place without complete control of the pain. He indicates that the right lower extremity is involved somewhat more than the left. The pain begins in the buttocks area and hip area, and then may go down the leg. He returns for further evaluation.  Past Medical History  Diagnosis Date  . Heart murmur     arrythmia  . AAA (abdominal aortic aneurysm)   . Hyperlipidemia     takes Niacin daily and Lipitor daily  . Joint pain   . Leg pain 05-04-10    with walking  . History of shingles   . Peripheral vascular disease     s/p AAA stent  . Dysrhythmia     palpitations- sees dr Irish Lack  . ADD (attention deficit disorder)   . Bipolar affective disorder     in remission on meds  . Chronic back pain     [facility]  . Colon polyps     Dr Cristina Gong does a colonoscopy every 5 years  . Hypertension     takes Amlodipine and Quinapril daily  . Peripheral edema     takes Furosemide prn  . Hx of seasonal allergies   . Arthritis     left knee  . Chronic back pain     scoliosis  . Hx  of scabies Mid Feb 2013  . GERD (gastroesophageal reflux disease)     takes Zantac daily  . Constipation     takes Colace prn  . Urinary urgency   . Nocturia   . Enlarged prostate     slightly  . Blood transfusion     several times  . Bipolar 1 disorder     takes Seroquel nightly  . Depression     takes Effexor daily  . Insomnia     takes Trazodone nightly  . Anxiety     takes Clonazepam daily  . Memory loss     short and long term  . Full dentures   . Wears glasses   . CHF (congestive heart failure)   . Chronic low back pain 07/31/2013    Past Surgical History  Procedure Laterality Date  . Spine surgery  12/2009    Spinal Fusion by Dr. Marcial Pacas  . Abdominal aortic aneurysm repair  11/2009    Stent graft repair  . Foot surgery      Multiple surgeries on right foot  . Shoulder surgery      Right shoulder  .  Tonsillectomy    . Testicle surgery      right testicle  . Turp vaporization    . Cholecystectomy    . Back surgery      thoracic spine  . Spinal cord stimulator implant  2009  . Vascular surgery      AAA stent repair  . Cardiac catheterization      15 yrs ago  . Spinal fusion  04/04/2011    Procedure: FUSION POSTERIOR SPINAL MULTILEVEL/SCOLIOSIS;  Surgeon: Gunnar Bulla, MD;  Location: [place];  Service: Orthopedics;  Laterality: N/A;  repair thoracic and lumbar  pseudoarthrosis, revise spinal cord stimulator, extend thoracic fusion, iliac crest bone graft  . Colonoscopy    . Ganglion cyst excision Right 06/07/2012    Procedure: DISTAL POLE EXCISION RIGHT WRIST;  Surgeon: Wynonia Sours, MD;  Location: [place];  Service: Orthopedics;  Laterality: Right;    Family History  Problem Relation Age of Onset  . Cancer Mother     melanoma  . Diabetes Father   . Heart attack Father   . Arthritis Sister     rheumatoid  . Cancer Sister   . Anesthesia problems Neg Hx   . Hypotension Neg Hx   . Malignant hyperthermia Neg Hx   . Pseudochol deficiency Neg Hx     Social history:  reports that he quit smoking about 3 months ago. His smoking use included Cigars. He has never used smokeless tobacco. He reports that he does not drink alcohol or use illicit drugs.  Medications:  Current Outpatient Prescriptions on File Prior to Visit  Medication Sig Dispense Refill  . amLODipine (NORVASC) 5 MG tablet Take 10 mg by mouth daily.     . Ascorbic Acid (VITAMIN C) 1000 MG tablet Take 500 mg by mouth daily.     Marland Kitchen aspirin 325 MG tablet Take 162 mg by mouth daily.     Marland Kitchen atorvastatin (LIPITOR) 80 MG tablet Take 80 mg by mouth daily.    Marland Kitchen b complex vitamins tablet Take 1 tablet by mouth every morning.    . calcium carbonate (OS-CAL) 600 MG TABS Take 600 mg by mouth daily.      . cholecalciferol (VITAMIN D) 1000 UNITS tablet Take 2,000 Units by mouth every morning.     . clonazePAM  (  KLONOPIN) 1 MG tablet Take 2 mg by mouth at bedtime. 2 at night for sleep & 1 during the day for anxiety if needed    . fentaNYL (DURAGESIC - DOSED MCG/HR) 100 MCG/HR Place 1 mcg onto the skin daily.    . furosemide (LASIX) 40 MG tablet Take 80 mg by mouth as needed. For fluid retention    . ibuprofen (ADVIL,MOTRIN) 200 MG tablet Take 200 mg by mouth every 6 (six) hours as needed. For pain    . magnesium oxide (MAG-OX) 400 MG tablet Take 400 mg by mouth daily.      . methocarbamol (ROBAXIN) 500 MG tablet Take 1,000 mg by mouth 3 (three) times daily.     . Multiple Vitamin (MULTIVITAMIN) tablet Take 1 tablet by mouth daily.      . niacin (NIASPAN) 500 MG CR tablet Take 500 mg by mouth at bedtime.    . pregabalin (LYRICA) 100 MG capsule Take 100 mg by mouth 3 (three) times daily.     . pseudoephedrine (SUDAFED) 30 MG tablet Take 30 mg by mouth every 4 (four) hours as needed for congestion.    . QUEtiapine (SEROQUEL) 100 MG tablet Take 300 mg by mouth at bedtime.     . quinapril (ACCUPRIL) 40 MG tablet Take 80 mg by mouth daily.     . traZODone (DESYREL) 100 MG tablet Take 400 mg by mouth at bedtime.     Marland Kitchen venlafaxine (EFFEXOR-XR) 150 MG 24 hr capsule Take 150 mg by mouth every morning.     . vitamin E 1000 UNIT capsule Take 1,000 Units by mouth daily.     No current facility-administered medications on file prior to visit.     No Known Allergies  ROS:  Out of a complete 14 system review of symptoms, the patient complains only of the following symptoms, and all other reviewed systems are negative.  Fatigue Ringing in the ears Double vision Memory loss, confusion, weakness, dizziness Depression, anxiety, too much sleep, decreased energy  Blood pressure 148/83, pulse 81, height 5\' 9"  (1.753 m), weight 246 lb 3.2 oz (111.676 kg).  Physical Exam  General: The patient is alert and cooperative at the time of the examination. The patient is moderately obese.  Eyes: Pupils are equal,  round, and reactive to light. Discs are flat bilaterally.  Neck: The neck is supple, no carotid bruits are noted on the right, but there may be a bruit or a radiation of the heart murmur on the left.  Respiratory: The respiratory examination is clear.  Cardiovascular: The cardiovascular examination reveals a regular rate and rhythm, a grade II/VI systolic ejection murmur at the aortic area is noted.  Skin: Extremities are with 1+ edema in ankles bilaterally.  Neurologic Exam  Mental status: The patient is alert and oriented x 3 at the time of the examination. The patient has apparent normal recent and remote memory, with an apparently normal attention span and concentration ability.  Cranial nerves: Facial symmetry is present. There is good sensation of the face to pinprick and soft touch bilaterally. The strength of the facial muscles and the muscles to head turning and shoulder shrug are normal bilaterally. Speech is well enunciated, no aphasia or dysarthria is noted. Extraocular movements are full. Visual fields are full. The tongue is midline, and the patient has symmetric elevation of the soft palate. No obvious hearing deficits are noted.  Motor: The motor testing reveals 5 over 5 strength of all 4 extremities. Good  symmetric motor tone is noted throughout.  Sensory: Sensory testing is intact to pinprick, soft touch, vibration sensation, and position sense on all 4 extremities, with the exception that there is some decreased pinprick sensation, and vibration sensation on the feet. No evidence of extinction is noted.  Coordination: Cerebellar testing reveals good finger-nose-finger and heel-to-shin bilaterally.  Gait and station: Gait is slightly stooped, wide-based, the patient uses a cane for ambulation.. Tandem gait is unsteady. Romberg is negative. No drift is seen.  Reflexes: Deep tendon reflexes are symmetric, but are depressed bilaterally. Toes are downgoing bilaterally.   CT  myelogram thoracic and lumbar 04/23/13:  IMPRESSION: 1. Extensive posterior spinal fixation as above with progressive posterior and interbody osseous fusion as above. Mild lucency suggestive of loosening involving the right S1 screw does not appear significantly changed from prior CT. 2. No evidence of thoracic or lumbar spinal canal stenosis. 3. Mild left neural foraminal narrowing at L3-4, L4-5, and L5-S1 due to foraminal osteophytes. Left lateral recess stenosis at L4-5 due to disc osteophyte complex.   Assessment/Plan:  1. Chronic low back pain  2. Mild gait instability  The patient reporting the pain that is associated with turning on his side in bed. Either side will result in pain down the leg on that side. The patient has had a myelogram involving the thoracic and lumbar spine does not show an obvious etiology of the pain. The patient will be sent for nerve conduction studies of both legs, EMG evaluation of both legs. The patient does have some mild tenderness over the SI joints. SI joint dysfunction may be a consideration in this patient. He will follow-up for the EMG.  Jill Alexanders MD 01/20/2014 6:22 PM  [facility] [address] [place], [place] [address]  Phone [phone] Fax [phone]

## 2014-01-26 NOTE — Addendum Note (Signed)
Addended by: Lolita Cram T on: 01/26/2014 11:34 AM   Modules accepted: Medications

## 2014-01-28 ENCOUNTER — Encounter: Payer: Self-pay | Admitting: Neurology

## 2014-01-29 ENCOUNTER — Encounter (INDEPENDENT_AMBULATORY_CARE_PROVIDER_SITE_OTHER): Payer: Self-pay

## 2014-01-29 ENCOUNTER — Telehealth: Payer: Self-pay | Admitting: *Deleted

## 2014-01-29 ENCOUNTER — Ambulatory Visit (INDEPENDENT_AMBULATORY_CARE_PROVIDER_SITE_OTHER): Payer: Private Health Insurance - Indemnity | Admitting: Neurology

## 2014-01-29 DIAGNOSIS — M545 Low back pain, unspecified: Secondary | ICD-10-CM

## 2014-01-29 DIAGNOSIS — Z0289 Encounter for other administrative examinations: Secondary | ICD-10-CM

## 2014-01-29 DIAGNOSIS — G8929 Other chronic pain: Secondary | ICD-10-CM

## 2014-01-29 NOTE — Procedures (Signed)
     HISTORY:  Michael Lutz is a 68 year old gentleman with a history of chronic low back pain with extensive fusion procedures in his low back and thoracic spine. The patient reports pain when rolling to either side while in bed, with pain in the hip and pain down the leg. He does not have the pain when he is lying on his back. The patient being evaluated for this issue.  NERVE CONDUCTION STUDIES:  Nerve conduction studies were performed on both lower extremities. Study of the right peroneal and posterior tibial nerves were unobtainable. The right peroneal sensory latency was unobtainable. The distal motor latencies and motor amplitudes for the left peroneal and posterior tibial nerves were normal, with normal nerve conduction velocities seen for these nerves. The H reflex latency on the left was normal. The left peroneal sensory latency is normal.  EMG STUDIES:  EMG study was performed on the right lower extremity:  The tibialis anterior muscle reveals 2 to 4K motor units with full recruitment. No fibrillations or positive waves were seen. The peroneus tertius muscle reveals 2 to 4K motor units with full recruitment. No fibrillations or positive waves were seen. The medial gastrocnemius muscle reveals 1 to 3K motor units with full recruitment. No fibrillations or positive waves were seen. The vastus lateralis muscle reveals 2 to 4K motor units with full recruitment. No fibrillations or positive waves were seen. The iliopsoas muscle reveals 2 to 4K motor units with full recruitment. No fibrillations or positive waves were seen. The biceps femoris muscle (long head) reveals 2 to 4K motor units with full recruitment. No fibrillations or positive waves were seen. The lumbosacral paraspinal muscles were tested at 3 levels, and revealed no abnormalities of insertional activity at the upper and lower levels tested. 2+ fibrillations were seen at the mid-level. There was good relaxation.  EMG study  was performed on the left lower extremity:  The tibialis anterior muscle reveals 2 to 4K motor units with full recruitment. No fibrillations or positive waves were seen. The peroneus tertius muscle reveals 2 to 4K motor units with full recruitment. No fibrillations or positive waves were seen. The medial gastrocnemius muscle reveals 1 to 3K motor units with full recruitment. No fibrillations or positive waves were seen. The vastus lateralis muscle reveals 2 to 4K motor units with full recruitment. No fibrillations or positive waves were seen. The iliopsoas muscle reveals 2 to 4K motor units with full recruitment. No fibrillations or positive waves were seen. The biceps femoris muscle (long head) reveals 2 to 4K motor units with full recruitment. No fibrillations or positive waves were seen. The lumbosacral paraspinal muscles were tested at 3 levels, and revealed no abnormalities of insertional activity at the upper and lower 3 levels tested. 2+ fibrillations were seen at the mid-level. There was good relaxation.   IMPRESSION:  Nerve conduction studies revealed dysfunction of the right peroneal and posterior tibial nerves likely associated with distal dysfunction, no evidence of a neuropathy is seen on the left. EMG evaluation of both lower extremities were surprisingly unremarkable, without evidence of overlying acute or chronic lumbosacral radiculopathy on either side.  Jill Alexanders MD 01/29/2014 5:12 PM  [facility] [address] [place], [place] [address]  Phone [phone] Fax [phone]

## 2014-01-29 NOTE — Telephone Encounter (Signed)
ERROR

## 2014-01-29 NOTE — Progress Notes (Signed)
Michael Lutz is a 68 year old gentleman with a history of chronic low back pain. The patient has had extensive fusion procedures with parenting rods in the thoracic and lumbosacral spine. The patient reports pain in the hip areas on both sides when rolling to one side or the other while in bed, with pain radiating down the leg. The patient being evaluated for these symptoms.  The patient has had nerve conduction studies done on both legs, no response was seen on the right leg for the posterior tibial nerve and peroneal nerve. The study of the left leg was normal. EMG evaluation of both legs worse presently normal, without evidence of an acute or chronic lumbosacral radiculopathy.  The patient will be sent for SI joint injections to see if this improves his discomfort. The patient will follow-up in 3-4 months.

## 2014-02-03 ENCOUNTER — Encounter: Payer: Self-pay | Admitting: Neurology

## 2014-02-03 ENCOUNTER — Other Ambulatory Visit: Payer: Self-pay | Admitting: Neurology

## 2014-02-03 DIAGNOSIS — M25552 Pain in left hip: Principal | ICD-10-CM

## 2014-02-03 DIAGNOSIS — M79605 Pain in left leg: Secondary | ICD-10-CM

## 2014-02-03 DIAGNOSIS — M79604 Pain in right leg: Secondary | ICD-10-CM

## 2014-02-03 DIAGNOSIS — M25551 Pain in right hip: Secondary | ICD-10-CM

## 2014-02-04 ENCOUNTER — Ambulatory Visit
Admission: RE | Admit: 2014-02-04 | Discharge: 2014-02-04 | Disposition: A | Discharge status: Home / Self Care | Payer: Managed Care, Other (non HMO) | Source: Ambulatory Visit | Attending: Neurology | Admitting: Neurology

## 2014-02-04 DIAGNOSIS — M25551 Pain in right hip: Secondary | ICD-10-CM

## 2014-02-04 DIAGNOSIS — M79605 Pain in left leg: Secondary | ICD-10-CM

## 2014-02-04 DIAGNOSIS — M79604 Pain in right leg: Secondary | ICD-10-CM

## 2014-02-04 DIAGNOSIS — M25552 Pain in left hip: Principal | ICD-10-CM

## 2014-02-04 MED ORDER — METHYLPREDNISOLONE ACETATE 40 MG/ML INJ SUSP (RADIOLOG
120.0000 mg | Freq: Once | INTRAMUSCULAR | Status: AC
  Administered 2014-02-04: 60 mg via INTRA_ARTICULAR

## 2014-02-04 MED ORDER — IOHEXOL 180 MG/ML  SOLN
1.0000 mL | Freq: Once | INTRAMUSCULAR | Status: AC | PRN
  Administered 2014-02-04: 1 mL via INTRA_ARTICULAR

## 2014-02-04 MED ORDER — METHYLPREDNISOLONE ACETATE 40 MG/ML INJ SUSP (RADIOLOG
60.0000 mg | Freq: Once | INTRAMUSCULAR | Status: AC
  Administered 2014-02-04: 60 mg via INTRA_ARTICULAR

## 2014-02-04 NOTE — Discharge Instructions (Signed)

## 2014-03-04 DIAGNOSIS — F4322 Adjustment disorder with anxiety: Secondary | ICD-10-CM | POA: Diagnosis not present

## 2014-03-04 DIAGNOSIS — M961 Postlaminectomy syndrome, not elsewhere classified: Secondary | ICD-10-CM | POA: Diagnosis not present

## 2014-03-04 DIAGNOSIS — Z79891 Long term (current) use of opiate analgesic: Secondary | ICD-10-CM | POA: Diagnosis not present

## 2014-03-04 DIAGNOSIS — G894 Chronic pain syndrome: Secondary | ICD-10-CM | POA: Diagnosis not present

## 2014-04-01 DIAGNOSIS — Z79891 Long term (current) use of opiate analgesic: Secondary | ICD-10-CM | POA: Diagnosis not present

## 2014-04-01 DIAGNOSIS — M961 Postlaminectomy syndrome, not elsewhere classified: Secondary | ICD-10-CM | POA: Diagnosis not present

## 2014-04-01 DIAGNOSIS — G894 Chronic pain syndrome: Secondary | ICD-10-CM | POA: Diagnosis not present

## 2014-04-01 DIAGNOSIS — F4322 Adjustment disorder with anxiety: Secondary | ICD-10-CM | POA: Diagnosis not present

## 2014-05-11 DIAGNOSIS — F4322 Adjustment disorder with anxiety: Secondary | ICD-10-CM | POA: Diagnosis not present

## 2014-05-11 DIAGNOSIS — G894 Chronic pain syndrome: Secondary | ICD-10-CM | POA: Diagnosis not present

## 2014-05-11 DIAGNOSIS — M961 Postlaminectomy syndrome, not elsewhere classified: Secondary | ICD-10-CM | POA: Diagnosis not present

## 2014-05-11 DIAGNOSIS — Z79891 Long term (current) use of opiate analgesic: Secondary | ICD-10-CM | POA: Diagnosis not present

## 2015-01-14 ENCOUNTER — Ambulatory Visit: Payer: Medicare Other | Admitting: Family

## 2015-01-14 ENCOUNTER — Other Ambulatory Visit (HOSPITAL_COMMUNITY): Payer: Medicare Other

## 2015-01-19 ENCOUNTER — Encounter: Payer: Self-pay | Admitting: Family

## 2015-01-22 ENCOUNTER — Ambulatory Visit (HOSPITAL_COMMUNITY)
Admission: RE | Admit: 2015-01-22 | Discharge: 2015-01-22 | Disposition: A | Discharge status: Home / Self Care | Payer: PPO | Source: Ambulatory Visit | Attending: Vascular Surgery | Admitting: Vascular Surgery

## 2015-01-22 ENCOUNTER — Encounter (INDEPENDENT_AMBULATORY_CARE_PROVIDER_SITE_OTHER): Payer: Self-pay

## 2015-01-22 ENCOUNTER — Ambulatory Visit (INDEPENDENT_AMBULATORY_CARE_PROVIDER_SITE_OTHER): Payer: PPO | Admitting: Family

## 2015-01-22 ENCOUNTER — Encounter: Payer: Self-pay | Admitting: Family

## 2015-01-22 VITALS — BP 138/92 | HR 78 | Temp 97.0°F | Resp 18 | Ht 68.0 in | Wt 258.0 lb

## 2015-01-22 DIAGNOSIS — I714 Abdominal aortic aneurysm, without rupture, unspecified: Secondary | ICD-10-CM

## 2015-01-22 DIAGNOSIS — Z95828 Presence of other vascular implants and grafts: Secondary | ICD-10-CM

## 2015-01-22 DIAGNOSIS — E785 Hyperlipidemia, unspecified: Secondary | ICD-10-CM | POA: Diagnosis not present

## 2015-01-22 DIAGNOSIS — Z48812 Encounter for surgical aftercare following surgery on the circulatory system: Secondary | ICD-10-CM | POA: Diagnosis not present

## 2015-01-22 NOTE — Progress Notes (Signed)
[facility] [place]  Established EVAR  History of Present Illness  Michael Lutz is a 69 y.o. (31-Jul-1945) male patient of Dr. Oneida Alar who presents for follow-up evaluation. 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 The patient denies new abdominal or back pain. He has chronic back pain from spine issues. The patient's atherosclerotic risk factors remain smoking, hyperlipidemia, hypertension. These are all currently stable and followed by his primary care physician. His aneurysm diameter was proximal 5 cm preoperatively.   He denies any new surgeries. He takes Lyrica for "nerve problem" in both legs. He started walking again for exercise, he is up to about 10 minutes daily.   Pt states he knows about his cardiac murmur, states he does not see a cardiologist. He denies dyspnea or chest pain.  He denies any history of TIA or stroke.  Pt Diabetic: No Pt smoker: former smoker, quit about August 2016, smoked for about 50 years.   Past Medical History  Diagnosis Date  . Heart murmur     arrythmia  . AAA (abdominal aortic aneurysm) ([place])   . Hyperlipidemia     takes Niacin daily and Lipitor daily  . Joint pain   . Leg pain 05-04-10    with walking  . History of shingles   . Peripheral vascular disease ([place])     s/p AAA stent  . Dysrhythmia     palpitations- sees dr Irish Lack  . ADD (attention deficit disorder)   . Bipolar affective disorder ([place])     in remission on meds  . Chronic back pain     [facility]  . Colon polyps     Dr Cristina Gong does a colonoscopy every 5 years  . Hypertension     takes Amlodipine and Quinapril daily  . Peripheral edema     takes Furosemide prn  . Hx of seasonal allergies   . Arthritis     left knee  . Chronic back pain     scoliosis  . Hx of scabies Mid Feb 2013  . GERD (gastroesophageal reflux disease)     takes Zantac daily  . Constipation     takes Colace prn  .  Urinary urgency   . Nocturia   . Enlarged prostate     slightly  . Blood transfusion     several times  . Bipolar 1 disorder ([place])     takes Seroquel nightly  . Depression     takes Effexor daily  . Insomnia     takes Trazodone nightly  . Anxiety     takes Clonazepam daily  . Memory loss     short and long term  . Full dentures   . Wears glasses   . CHF (congestive heart failure) ([place])   . Chronic low back pain 07/31/2013   Past Surgical History  Procedure Laterality Date  . Spine surgery  12/2009    Spinal Fusion by Dr. Marcial Pacas  . Abdominal aortic aneurysm repair  11/2009    Stent graft repair  . Foot surgery      Multiple surgeries on right foot  . Shoulder surgery      Right shoulder  . Tonsillectomy    . Testicle surgery      right testicle  . Turp vaporization    . Cholecystectomy    . Back surgery      thoracic spine  . Spinal cord stimulator implant  2009  .  Vascular surgery      AAA stent repair  . Cardiac catheterization      15 yrs ago  . Spinal fusion  04/04/2011    Procedure: FUSION POSTERIOR SPINAL MULTILEVEL/SCOLIOSIS;  Surgeon: Gunnar Bulla, MD;  Location: [facility];  Service: Orthopedics;  Laterality: N/A;  repair thoracic and lumbar  pseudoarthrosis, revise spinal cord stimulator, extend thoracic fusion, iliac crest bone graft  . Colonoscopy    . Ganglion cyst excision Right 06/07/2012    Procedure: DISTAL POLE EXCISION RIGHT WRIST;  Surgeon: Wynonia Sours, MD;  Location: [facility];  Service: Orthopedics;  Laterality: Right;   Social History Social History  Substance Use Topics  . Smoking status: Former Smoker -- 0.50 packs/day for 50 years    Types: Cigars    Quit date: 10/08/2013  . Smokeless tobacco: Never Used     Comment: states smokes "little cigars"  . Alcohol Use: No   Family History Family History  Problem Relation Age of Onset  . Cancer Mother     melanoma  . Diabetes Father   . Heart attack Father   . Arthritis  Sister     rheumatoid  . Cancer Sister   . Anesthesia problems Neg Hx   . Hypotension Neg Hx   . Malignant hyperthermia Neg Hx   . Pseudochol deficiency Neg Hx    Current Outpatient Prescriptions on File Prior to Visit  Medication Sig Dispense Refill  . amLODipine (NORVASC) 5 MG tablet Take 10 mg by mouth daily.     . Ascorbic Acid (VITAMIN C) 1000 MG tablet Take 500 mg by mouth daily.     Marland Kitchen aspirin 325 MG tablet Take 162 mg by mouth daily.     Marland Kitchen atorvastatin (LIPITOR) 80 MG tablet Take 80 mg by mouth daily.    Marland Kitchen b complex vitamins tablet Take 1 tablet by mouth every morning.    . calcium carbonate (OS-CAL) 600 MG TABS Take 600 mg by mouth daily.      . cholecalciferol (VITAMIN D) 1000 UNITS tablet Take 2,000 Units by mouth every morning.     . clonazePAM (KLONOPIN) 1 MG tablet Take 2 mg by mouth at bedtime. 2 at night for sleep & 1 during the day for anxiety if needed    . fentaNYL (DURAGESIC - DOSED MCG/HR) 100 MCG/HR Place 1 mcg onto the skin daily.    Marland Kitchen FLUZONE HIGH-DOSE 0.5 ML SUSY 0.5 mLs.  0  . furosemide (LASIX) 40 MG tablet Take 80 mg by mouth as needed. For fluid retention    . ibuprofen (ADVIL,MOTRIN) 200 MG tablet Take 200 mg by mouth every 6 (six) hours as needed. For pain    . magnesium oxide (MAG-OX) 400 MG tablet Take 400 mg by mouth daily.      . meclizine (ANTIVERT) 25 MG tablet Take 25 mg by mouth daily.    . methocarbamol (ROBAXIN) 500 MG tablet Take 1,000 mg by mouth 3 (three) times daily.     . Multiple Vitamin (MULTIVITAMIN) tablet Take 1 tablet by mouth daily.      . niacin (NIASPAN) 500 MG CR tablet Take 500 mg by mouth at bedtime.    Marland Kitchen nystatin (MYCOSTATIN/NYSTOP) 100000 UNIT/GM POWD 100,000 Units.    . Potassium (GNP POTASSIUM) 99 MG TABS     . pregabalin (LYRICA) 100 MG capsule Take 100 mg by mouth 3 (three) times daily.     . pseudoephedrine (SUDAFED) 30 MG tablet Take  30 mg by mouth every 4 (four) hours as needed for congestion.    . QUEtiapine (SEROQUEL)  100 MG tablet Take 300 mg by mouth at bedtime.     . quinapril (ACCUPRIL) 40 MG tablet Take 80 mg by mouth daily.     . ranitidine (ZANTAC) 150 MG capsule Take 150 mg by mouth daily.    . simethicone (MYLICON) 0000000 MG chewable tablet Chew 125 mg by mouth as needed for flatulence.    . traZODone (DESYREL) 100 MG tablet Take 400 mg by mouth at bedtime.     Marland Kitchen venlafaxine (EFFEXOR-XR) 150 MG 24 hr capsule Take 150 mg by mouth every morning.     . vitamin E 1000 UNIT capsule Take 1,000 Units by mouth daily.     No current facility-administered medications on file prior to visit.   No Known Allergies   ROS: See HPI for pertinent positives and negatives.  Physical Examination  Filed Vitals:   01/22/15 1356  BP: 138/92  Pulse: 78  Temp: 97 F (36.1 C)  Resp: 18  Height: 5\' 8"  (1.727 m)  Weight: 258 lb (117.028 kg)  SpO2: 95%   Body mass index is 39.24 kg/(m^2).  General: A&O x 3, WD, obese male  Pulmonary: Sym exp, good air movt, CTAB, no rales, rhonchi, or wheezing   Cardiac: RRR, Nl S1, S2, + murmur  Vascular: Vessel Right Left  Radial 2+Palpable 2+Palpable  Carotid  without bruit  without bruit  Aorta Not palpable N/A  Femoral 1+Palpable 1+Palpable  Popliteal Not palpable Not palpable  PT 2+ Palpable 2+Palpable  DP 2+ Palpable 2+ Palpable   Gastrointestinal: soft, NTND, -G/R, - HSM, - palpable masses, - CVAT B.  Musculoskeletal: M/S 5/5 throughout, extremities without ischemic changes.  Neurologic: Pain and light touch intact in extremities, Motor exam as listed above     Non-Invasive Vascular Imaging  EVAR Duplex (Date: 01/22/2015) ABDOMINAL AORTA DUPLEX EVALUATION - POST ENDOVASCULAR REPAIR    INDICATION: Evaluation of endovascular abdominal repair of aortic aneurysm.    PREVIOUS INTERVENTION(S):     DUPLEX EXAM:      DIAMETER AP (cm) DIAMETER TRANSVERSE (cm) VELOCITIES (cm/sec)  Aorta 3.41  47  Right Common Iliac Not Visualized  Not Visualized    Left  Common Iliac Not visualized  Not visualized     Comparison Study       Date DIAMETER AP (cm) DIAMETER TRANSVERSE (cm)  07/03/2013 4.34      ADDITIONAL FINDINGS: Technically difficult exam due to overlying bowel gas.    IMPRESSION: Patent endovascular abdominal aortic repair with a maximum diameter of 3.41 cm, based on limited visualization.    Compared to the previous exam:  Comparison to the previous exam not valid due to limited visualization.      Medical Decision Making  Michael Lutz is a 69 y.o. male who presents s/p EVAR (Date: September, 2011).  Pt is asymptomatic with decreased sac size.  I discussed with the patient the importance of surveillance of the endograft.  The next endograft duplex will be scheduled for 12 months.  The patient will follow up with [place] in 12 months with these studies.  I emphasized the importance of maximal medical management including strict control of blood pressure, blood glucose, and lipid levels, antiplatelet agents, obtaining regular exercise, and cessation of smoking.   Thank you for allowing [place] to participate in this patient's care.  Vinnie Level Gino Garrabrant, RN, MSN, FNP-C [facility] [place]  Office: [phone]  Clinic Physician: Bridgett Larsson on call  01/22/2015, 2:12 PM

## 2015-03-29 DIAGNOSIS — Z79891 Long term (current) use of opiate analgesic: Secondary | ICD-10-CM | POA: Diagnosis not present

## 2015-03-29 DIAGNOSIS — M961 Postlaminectomy syndrome, not elsewhere classified: Secondary | ICD-10-CM | POA: Diagnosis not present

## 2015-03-29 DIAGNOSIS — G894 Chronic pain syndrome: Secondary | ICD-10-CM | POA: Diagnosis not present

## 2015-03-29 DIAGNOSIS — F4322 Adjustment disorder with anxiety: Secondary | ICD-10-CM | POA: Diagnosis not present

## 2015-05-26 DIAGNOSIS — Z79891 Long term (current) use of opiate analgesic: Secondary | ICD-10-CM | POA: Diagnosis not present

## 2015-05-26 DIAGNOSIS — M961 Postlaminectomy syndrome, not elsewhere classified: Secondary | ICD-10-CM | POA: Diagnosis not present

## 2015-05-26 DIAGNOSIS — G894 Chronic pain syndrome: Secondary | ICD-10-CM | POA: Diagnosis not present

## 2015-05-26 DIAGNOSIS — F4322 Adjustment disorder with anxiety: Secondary | ICD-10-CM | POA: Diagnosis not present

## 2015-06-17 DIAGNOSIS — F3111 Bipolar disorder, current episode manic without psychotic features, mild: Secondary | ICD-10-CM | POA: Diagnosis not present

## 2015-07-22 DIAGNOSIS — F4322 Adjustment disorder with anxiety: Secondary | ICD-10-CM | POA: Diagnosis not present

## 2015-07-22 DIAGNOSIS — Z79891 Long term (current) use of opiate analgesic: Secondary | ICD-10-CM | POA: Diagnosis not present

## 2015-07-22 DIAGNOSIS — G894 Chronic pain syndrome: Secondary | ICD-10-CM | POA: Diagnosis not present

## 2015-07-22 DIAGNOSIS — M961 Postlaminectomy syndrome, not elsewhere classified: Secondary | ICD-10-CM | POA: Diagnosis not present

## 2015-08-16 DIAGNOSIS — Z209 Contact with and (suspected) exposure to unspecified communicable disease: Secondary | ICD-10-CM | POA: Diagnosis not present

## 2015-08-16 DIAGNOSIS — Z125 Encounter for screening for malignant neoplasm of prostate: Secondary | ICD-10-CM | POA: Diagnosis not present

## 2015-08-16 DIAGNOSIS — Z87891 Personal history of nicotine dependence: Secondary | ICD-10-CM | POA: Diagnosis not present

## 2015-08-16 DIAGNOSIS — I10 Essential (primary) hypertension: Secondary | ICD-10-CM | POA: Diagnosis not present

## 2015-08-16 DIAGNOSIS — Z6841 Body Mass Index (BMI) 40.0 and over, adult: Secondary | ICD-10-CM | POA: Diagnosis not present

## 2015-08-16 DIAGNOSIS — E7800 Pure hypercholesterolemia, unspecified: Secondary | ICD-10-CM | POA: Diagnosis not present

## 2015-08-16 DIAGNOSIS — Z79899 Other long term (current) drug therapy: Secondary | ICD-10-CM | POA: Diagnosis not present

## 2015-08-19 DIAGNOSIS — F4322 Adjustment disorder with anxiety: Secondary | ICD-10-CM | POA: Diagnosis not present

## 2015-08-19 DIAGNOSIS — M961 Postlaminectomy syndrome, not elsewhere classified: Secondary | ICD-10-CM | POA: Diagnosis not present

## 2015-08-19 DIAGNOSIS — G894 Chronic pain syndrome: Secondary | ICD-10-CM | POA: Diagnosis not present

## 2015-08-19 DIAGNOSIS — Z79891 Long term (current) use of opiate analgesic: Secondary | ICD-10-CM | POA: Diagnosis not present

## 2015-09-16 DIAGNOSIS — Z79891 Long term (current) use of opiate analgesic: Secondary | ICD-10-CM | POA: Diagnosis not present

## 2015-09-16 DIAGNOSIS — G894 Chronic pain syndrome: Secondary | ICD-10-CM | POA: Diagnosis not present

## 2015-09-16 DIAGNOSIS — M961 Postlaminectomy syndrome, not elsewhere classified: Secondary | ICD-10-CM | POA: Diagnosis not present

## 2015-09-16 DIAGNOSIS — F4322 Adjustment disorder with anxiety: Secondary | ICD-10-CM | POA: Diagnosis not present

## 2015-11-11 DIAGNOSIS — G894 Chronic pain syndrome: Secondary | ICD-10-CM | POA: Diagnosis not present

## 2015-11-11 DIAGNOSIS — Z79891 Long term (current) use of opiate analgesic: Secondary | ICD-10-CM | POA: Diagnosis not present

## 2015-11-11 DIAGNOSIS — M961 Postlaminectomy syndrome, not elsewhere classified: Secondary | ICD-10-CM | POA: Diagnosis not present

## 2015-11-11 DIAGNOSIS — F3111 Bipolar disorder, current episode manic without psychotic features, mild: Secondary | ICD-10-CM | POA: Diagnosis not present

## 2015-11-11 DIAGNOSIS — F4322 Adjustment disorder with anxiety: Secondary | ICD-10-CM | POA: Diagnosis not present

## 2016-01-06 DIAGNOSIS — G894 Chronic pain syndrome: Secondary | ICD-10-CM | POA: Diagnosis not present

## 2016-01-06 DIAGNOSIS — M961 Postlaminectomy syndrome, not elsewhere classified: Secondary | ICD-10-CM | POA: Diagnosis not present

## 2016-01-06 DIAGNOSIS — Z79891 Long term (current) use of opiate analgesic: Secondary | ICD-10-CM | POA: Diagnosis not present

## 2016-01-06 DIAGNOSIS — F4322 Adjustment disorder with anxiety: Secondary | ICD-10-CM | POA: Diagnosis not present

## 2016-01-21 ENCOUNTER — Encounter: Payer: Self-pay | Admitting: Family

## 2016-01-27 ENCOUNTER — Other Ambulatory Visit (HOSPITAL_COMMUNITY): Payer: PPO

## 2016-01-27 ENCOUNTER — Ambulatory Visit: Payer: PPO | Admitting: Family

## 2016-02-10 DIAGNOSIS — F3111 Bipolar disorder, current episode manic without psychotic features, mild: Secondary | ICD-10-CM | POA: Diagnosis not present

## 2016-03-02 ENCOUNTER — Encounter (HOSPITAL_COMMUNITY): Payer: Self-pay | Admitting: Emergency Medicine

## 2016-03-02 ENCOUNTER — Emergency Department (HOSPITAL_COMMUNITY): Payer: PPO

## 2016-03-02 ENCOUNTER — Emergency Department (HOSPITAL_COMMUNITY)
Admission: EM | Admit: 2016-03-02 | Discharge: 2016-03-02 | Disposition: A | Discharge status: Home / Self Care | Payer: PPO | Attending: Emergency Medicine | Admitting: Emergency Medicine

## 2016-03-02 DIAGNOSIS — I110 Hypertensive heart disease with heart failure: Secondary | ICD-10-CM | POA: Insufficient documentation

## 2016-03-02 DIAGNOSIS — M546 Pain in thoracic spine: Secondary | ICD-10-CM | POA: Diagnosis not present

## 2016-03-02 DIAGNOSIS — F909 Attention-deficit hyperactivity disorder, unspecified type: Secondary | ICD-10-CM | POA: Insufficient documentation

## 2016-03-02 DIAGNOSIS — W010XXA Fall on same level from slipping, tripping and stumbling without subsequent striking against object, initial encounter: Secondary | ICD-10-CM | POA: Insufficient documentation

## 2016-03-02 DIAGNOSIS — M545 Low back pain: Secondary | ICD-10-CM | POA: Insufficient documentation

## 2016-03-02 DIAGNOSIS — S2232XA Fracture of one rib, left side, initial encounter for closed fracture: Secondary | ICD-10-CM | POA: Insufficient documentation

## 2016-03-02 DIAGNOSIS — S8392XA Sprain of unspecified site of left knee, initial encounter: Secondary | ICD-10-CM | POA: Diagnosis not present

## 2016-03-02 DIAGNOSIS — Y999 Unspecified external cause status: Secondary | ICD-10-CM | POA: Insufficient documentation

## 2016-03-02 DIAGNOSIS — S3992XA Unspecified injury of lower back, initial encounter: Secondary | ICD-10-CM | POA: Diagnosis not present

## 2016-03-02 DIAGNOSIS — G894 Chronic pain syndrome: Secondary | ICD-10-CM | POA: Diagnosis not present

## 2016-03-02 DIAGNOSIS — Y929 Unspecified place or not applicable: Secondary | ICD-10-CM | POA: Insufficient documentation

## 2016-03-02 DIAGNOSIS — S299XXA Unspecified injury of thorax, initial encounter: Secondary | ICD-10-CM | POA: Diagnosis not present

## 2016-03-02 DIAGNOSIS — I509 Heart failure, unspecified: Secondary | ICD-10-CM | POA: Insufficient documentation

## 2016-03-02 DIAGNOSIS — W19XXXA Unspecified fall, initial encounter: Secondary | ICD-10-CM

## 2016-03-02 DIAGNOSIS — Z7982 Long term (current) use of aspirin: Secondary | ICD-10-CM | POA: Insufficient documentation

## 2016-03-02 DIAGNOSIS — R079 Chest pain, unspecified: Secondary | ICD-10-CM | POA: Diagnosis not present

## 2016-03-02 DIAGNOSIS — Z79891 Long term (current) use of opiate analgesic: Secondary | ICD-10-CM | POA: Diagnosis not present

## 2016-03-02 DIAGNOSIS — M25462 Effusion, left knee: Secondary | ICD-10-CM | POA: Diagnosis not present

## 2016-03-02 DIAGNOSIS — Y939 Activity, unspecified: Secondary | ICD-10-CM | POA: Diagnosis not present

## 2016-03-02 DIAGNOSIS — Z87891 Personal history of nicotine dependence: Secondary | ICD-10-CM | POA: Insufficient documentation

## 2016-03-02 NOTE — Discharge Instructions (Signed)
One of the screws on your sacrum appears to be loosening. You will need to follow up with an orthopedist to further evaluate and manage this

## 2016-03-02 NOTE — ED Provider Notes (Addendum)
[facility] DEPT Provider Note   CSN: [id number] Arrival date & time: 03/02/16  1636     History   Chief Complaint Chief Complaint  Patient presents with  . Fall    HPI Michael Lutz is a 70 y.o. male.  HPI  70 year old male with a history of chronic pain who sees a pain specialist presents with left rib pain, left knee pain, and back pain. He states he slipped and fell on a pillow 2 nights ago. He did not hit his head or lose consciousness. He's been having pain in his left lateral chest and left knee since. He saw his pain specialist today and had point tenderness in his thoracic and lumbar back. Was told to come to the ER for x-rays. He denies any new weakness or numbness. He is able to a bili with a walker which is at his baseline. He is chronically on a fentanyl patch and ibuprofen and Tylenol. Declines further pain meds currently. No abdominal pain.  Past Medical History:  Diagnosis Date  . AAA (abdominal aortic aneurysm) ([place])   . ADD (attention deficit disorder)   . Anxiety    takes Clonazepam daily  . Arthritis    left knee  . Bipolar 1 disorder ([place])    takes Seroquel nightly  . Bipolar affective disorder ([place])    in remission on meds  . Blood transfusion    several times  . CHF (congestive heart failure) ([place])   . Chronic back pain    [facility]  . Chronic back pain    scoliosis  . Chronic low back pain 07/31/2013  . Colon polyps    Dr Cristina Gong does a colonoscopy every 5 years  . Constipation    takes Colace prn  . Depression    takes Effexor daily  . Dysrhythmia    palpitations- sees dr Irish Lack  . Enlarged prostate    slightly  . Full dentures   . GERD (gastroesophageal reflux disease)    takes Zantac daily  . Heart murmur    arrythmia  . History of shingles   . Hx of scabies Mid Feb 2013  . Hx of seasonal allergies   . Hyperlipidemia    takes Niacin daily and Lipitor daily  . Hypertension    takes Amlodipine and  Quinapril daily  . Insomnia    takes Trazodone nightly  . Joint pain   . Leg pain 05-04-10   with walking  . Memory loss    short and long term  . Nocturia   . Peripheral edema    takes Furosemide prn  . Peripheral vascular disease ([place])    s/p AAA stent  . Urinary urgency   . Wears glasses     Patient Active Problem List   Diagnosis Date Noted  . Abdominal aortic aneurysm ([place]) 01/08/2014  . Chronic low back pain 07/31/2013  . Abdominal aneurysm without mention of rupture 07/03/2013  . Dermatitis 04/10/2011    Past Surgical History:  Procedure Laterality Date  . ABDOMINAL AORTIC ANEURYSM REPAIR  11/2009   Stent graft repair  . BACK SURGERY     thoracic spine  . CARDIAC CATHETERIZATION     15 yrs ago  . CHOLECYSTECTOMY    . COLONOSCOPY    . FOOT SURGERY     Multiple surgeries on right foot  . GANGLION CYST EXCISION Right 06/07/2012   Procedure: DISTAL POLE EXCISION RIGHT WRIST;  Surgeon: Wynonia Sours, MD;  Location:  [facility];  Service: Orthopedics;  Laterality: Right;  . SHOULDER SURGERY     Right shoulder  . SPINAL CORD STIMULATOR IMPLANT  2009  . SPINAL FUSION  04/04/2011   Procedure: FUSION POSTERIOR SPINAL MULTILEVEL/SCOLIOSIS;  Surgeon: Gunnar Bulla, MD;  Location: [facility];  Service: Orthopedics;  Laterality: N/A;  repair thoracic and lumbar  pseudoarthrosis, revise spinal cord stimulator, extend thoracic fusion, iliac crest bone graft  . SPINE SURGERY  12/2009   Spinal Fusion by Dr. Marcial Pacas  . TESTICLE SURGERY     right testicle  . TONSILLECTOMY    . TURP VAPORIZATION    . VASCULAR SURGERY     AAA stent repair       Home Medications    Prior to Admission medications   Medication Sig Start Date End Date Taking? Authorizing Provider  5-Hydroxytryptophan (5-HTP) 100 MG CAPS Take 100 mg by mouth daily.   Yes Historical Provider, MD  amLODipine (NORVASC) 10 MG tablet  01/06/16  Yes Historical Provider, MD  Ascorbic Acid (VITAMIN C) 1000 MG  tablet Take 500 mg by mouth daily.    Yes Historical Provider, MD  aspirin 325 MG tablet Take 162 mg by mouth daily.    Yes Historical Provider, MD  atorvastatin (LIPITOR) 80 MG tablet Take 80 mg by mouth daily.   Yes Historical Provider, MD  b complex vitamins tablet Take 1 tablet by mouth every morning.   Yes Historical Provider, MD  calcium carbonate (OS-CAL) 600 MG TABS Take 600 mg by mouth daily.     Yes Historical Provider, MD  cholecalciferol (VITAMIN D) 1000 UNITS tablet Take 2,000 Units by mouth every morning.    Yes Historical Provider, MD  clonazePAM (KLONOPIN) 1 MG tablet Take 2 mg by mouth at bedtime. 2 at night for sleep & 1 during the day for anxiety if needed   Yes Historical Provider, MD  DHEA 50 MG CAPS Take 50 mg by mouth daily.   Yes Historical Provider, MD  fentaNYL (DURAGESIC - DOSED MCG/HR) 100 MCG/HR Place 100 mcg onto the skin every other day.  07/10/13  Yes Historical Provider, MD  FLUZONE HIGH-DOSE 0.5 ML SUSY 0.5 mLs. 12/06/13  Yes Historical Provider, MD  furosemide (LASIX) 40 MG tablet Take 80 mg by mouth as needed. For fluid retention   Yes Historical Provider, MD  ibuprofen (ADVIL,MOTRIN) 200 MG tablet Take 200 mg by mouth every 6 (six) hours as needed. For pain   Yes Historical Provider, MD  LYRICA 225 MG capsule Take 225 mg by mouth 3 (three) times daily.  03/02/16  Yes Historical Provider, MD  Lysine 1000 MG TABS Take 1,000 mg by mouth daily.   Yes Historical Provider, MD  magnesium oxide (MAG-OX) 400 MG tablet Take 400 mg by mouth daily.     Yes Historical Provider, MD  meclizine (ANTIVERT) 25 MG tablet Take 25 mg by mouth daily as needed for dizziness.  10/14/08  Yes Historical Provider, MD  methocarbamol (ROBAXIN) 750 MG tablet 750 mg 3 (three) times daily.  01/02/16  Yes Historical Provider, MD  Multiple Vitamin (MULTIVITAMIN) tablet Take 1 tablet by mouth daily.     Yes Historical Provider, MD  niacin (NIASPAN) 500 MG CR tablet Take 500 mg by mouth at bedtime.    Yes Historical Provider, MD  Potassium (GNP POTASSIUM) 99 MG TABS  10/14/08  Yes Historical Provider, MD  pseudoephedrine (SUDAFED) 30 MG tablet Take 30 mg by mouth every 4 (four) hours  as needed for congestion.   Yes Historical Provider, MD  QUEtiapine (SEROQUEL) 100 MG tablet Take 300 mg by mouth at bedtime.    Yes Historical Provider, MD  quinapril (ACCUPRIL) 40 MG tablet Take 80 mg by mouth daily.    Yes Historical Provider, MD  ranitidine (ZANTAC) 75 MG tablet Take 75 mg by mouth daily as needed for heartburn.   Yes Historical Provider, MD  RESTASIS 0.05 % ophthalmic emulsion  02/11/16  Yes Historical Provider, MD  simethicone (MYLICON) 0000000 MG chewable tablet Chew 125 mg by mouth as needed for flatulence.   Yes Historical Provider, MD  traZODone (DESYREL) 100 MG tablet Take 400 mg by mouth at bedtime.    Yes Historical Provider, MD  venlafaxine XR (EFFEXOR-XR) 75 MG 24 hr capsule Take 225 mg by mouth daily with breakfast.  02/10/16  Yes Historical Provider, MD  vitamin E 1000 UNIT capsule Take 1,000 Units by mouth daily.   Yes Historical Provider, MD    Family History Family History  Problem Relation Age of Onset  . Cancer Mother     melanoma  . Diabetes Father   . Heart attack Father   . Arthritis Sister     rheumatoid  . Cancer Sister   . Anesthesia problems Neg Hx   . Hypotension Neg Hx   . Malignant hyperthermia Neg Hx   . Pseudochol deficiency Neg Hx     Social History Social History  Substance Use Topics  . Smoking status: Former Smoker    Packs/day: 0.50    Years: 50.00    Types: Cigars    Quit date: 10/08/2013  . Smokeless tobacco: Never Used     Comment: states smokes "little cigars"  . Alcohol use No     Allergies   Patient has no known allergies.   Review of Systems Review of Systems  Respiratory: Negative for shortness of breath.   Cardiovascular: Positive for chest pain.  Gastrointestinal: Negative for abdominal pain and vomiting.  Musculoskeletal:  Positive for arthralgias and back pain. Negative for joint swelling.  Neurological: Negative for weakness, numbness and headaches.  All other systems reviewed and are negative.    Physical Exam Updated Vital Signs BP 161/95 (BP Location: Left Arm)   Pulse 76   Temp 97.8 F (36.6 C) (Oral)   Resp 18   Wt 275 lb 6.4 oz (124.9 kg)   SpO2 95%   BMI 41.87 kg/m   Physical Exam  Constitutional: He is oriented to person, place, and time. He appears well-developed and well-nourished. No distress.  obese  HENT:  Head: Normocephalic and atraumatic.  Right Ear: External ear normal.  Left Ear: External ear normal.  Nose: Nose normal.  Eyes: Right eye exhibits no discharge. Left eye exhibits no discharge.  Neck: Neck supple.  Cardiovascular: Normal rate, regular rhythm and normal heart sounds.   Pulses:      Dorsalis pedis pulses are 2+ on the left side.  Pulmonary/Chest: Effort normal and breath sounds normal. He exhibits tenderness.    Abdominal: Soft. He exhibits no distension. There is no tenderness.  Musculoskeletal: He exhibits no edema.       Left knee: He exhibits normal range of motion. Tenderness (mild) found.       Cervical back: He exhibits no tenderness.       Thoracic back: He exhibits tenderness.       Lumbar back: He exhibits tenderness.       Left upper leg: He exhibits  no tenderness.       Left lower leg: He exhibits no tenderness.       Legs: Neurological: He is alert and oriented to person, place, and time.  Skin: Skin is warm and dry. He is not diaphoretic.  Nursing note and vitals reviewed.    ED Treatments / Results  Labs (all labs ordered are listed, but only abnormal results are displayed) Labs Reviewed - No data to display  EKG  EKG Interpretation None       Radiology Dg Ribs Unilateral W/chest Left  Result Date: 03/02/2016 CLINICAL DATA:  Tripped and fell on the left side, left rib pain EXAM: LEFT RIBS AND CHEST - 3+ VIEW COMPARISON:   02/16/2012, FINDINGS: Single-view chest demonstrates no acute infiltrate or effusion. There is no pneumothorax. Stable cardiomediastinal silhouette with tortuous aorta. Posterior spinal rods and stimulator device are noted. Minimally displaced acute left seventh rib fracture. IMPRESSION: 1. No acute infiltrate, pneumothorax or pleural effusion 2. Minimally displaced acute left seventh rib fracture Electronically Signed   By: Donavan Foil M.D.   On: 03/02/2016 21:23   Dg Thoracic Spine W/swimmers  Result Date: 03/02/2016 CLINICAL DATA:  Fall with pain EXAM: THORACIC SPINE - 3 VIEWS COMPARISON:  CT 04/23/2013 FINDINGS: Posterior stabilization rods are present from approximate T5 level, inferior extent non included on the submitted views. Thoracic alignment demonstrates mild scoliosis of the mid thoracic spine, apex to the patient's left. Stimulator device remains in place. Vertebral bodies demonstrate grossly normal stature. IMPRESSION: No definite acute osseous abnormality. Electronically Signed   By: Donavan Foil M.D.   On: 03/02/2016 21:27   Dg Lumbar Spine Complete  Result Date: 03/02/2016 CLINICAL DATA:  Fall with pain EXAM: LUMBAR SPINE - COMPLETE 4+ VIEW COMPARISON:  01/08/2014, 07/28/2011 FINDINGS: Surgical clips in the right upper quadrant. Partially visualized stimulator wires. Aortoiliac vascular stent material. Spinal rods and fixating screws are visualized from the lower thoracic spine to the upper sacrum with left-sided fixation to the iliac bone. Sagittal alignment is similar compared to prior. Interbody device at L5-S1 similar compared to prior, osseous fusion at L4-L5. Lucency around the right greater than left iliac screws. Vertebral body heights are grossly maintained. IMPRESSION: 1. Extensive posterior stabilization rod and screw fixation of the thoracolumbar spine. Bony alignment grossly similar compared to prior. No definite acute osseous abnormality. 2. Lucency around the right  greater than left sacral screws suggestive of loosening. Electronically Signed   By: Donavan Foil M.D.   On: 03/02/2016 21:35   Dg Knee Complete 4 Views Left  Result Date: 03/02/2016 CLINICAL DATA:  Fall with left leg pain.  Initial encounter. EXAM: LEFT KNEE - COMPLETE 4+ VIEW COMPARISON:  None. FINDINGS: Knee joint effusion without fracture or malalignment. Mild degenerative marginal spurring and lateral compartment narrowing. Atherosclerosis. IMPRESSION: 1. Joint effusion without acute osseous finding. 2. Osteoarthritis. Electronically Signed   By: Monte Fantasia M.D.   On: 03/02/2016 21:24    Procedures Procedures (including critical care time)  Medications Ordered in ED Medications - No data to display   Initial Impression / Assessment and Plan / ED Course  I have reviewed the triage vital signs and the nursing notes.  Pertinent labs & imaging results that were available during my care of the patient were reviewed by me and considered in my medical decision making (see chart for details).  Clinical Course as of Mar 03 49  Thu Mar 02, 2016  2016 xrays  [SG]  Clinical Course User Index [SG] Sherwood Gambler, MD    Xray with 1 rib fracture. He declines pain meds in ED. He has a pain contract and will consult his pain specialist for further care. Knee effusion likely a sprain. No decreased ROM, warmth or redness. Discussed possible loose sacral screw, f/u with orthopedics. No abdominal tenderness.   Final Clinical Impressions(s) / ED Diagnoses   Final diagnoses:  Fall, initial encounter  Closed fracture of one rib of left side, initial encounter  Sprain of left knee, unspecified ligament, initial encounter    New Prescriptions Discharge Medication List as of 03/02/2016 10:35 PM       Sherwood Gambler, MD 03/03/16 [id number]    Sherwood Gambler, MD 03/03/16 [phone]

## 2016-03-02 NOTE — ED Triage Notes (Signed)
Per pt, states he tripped over pillow and fell on left side-states he has had a back fusion-complaining of back pain, left leg pain and left rib pain-was seen by pain MD and was told to come to ED for imaging

## 2016-03-16 DIAGNOSIS — H40013 Open angle with borderline findings, low risk, bilateral: Secondary | ICD-10-CM | POA: Diagnosis not present

## 2016-03-16 DIAGNOSIS — H2513 Age-related nuclear cataract, bilateral: Secondary | ICD-10-CM | POA: Diagnosis not present

## 2016-03-16 DIAGNOSIS — H25013 Cortical age-related cataract, bilateral: Secondary | ICD-10-CM | POA: Diagnosis not present

## 2016-03-16 DIAGNOSIS — H01003 Unspecified blepharitis right eye, unspecified eyelid: Secondary | ICD-10-CM | POA: Diagnosis not present

## 2016-03-21 DIAGNOSIS — I10 Essential (primary) hypertension: Secondary | ICD-10-CM | POA: Diagnosis not present

## 2016-03-21 DIAGNOSIS — R972 Elevated prostate specific antigen [PSA]: Secondary | ICD-10-CM | POA: Diagnosis not present

## 2016-03-21 DIAGNOSIS — I714 Abdominal aortic aneurysm, without rupture: Secondary | ICD-10-CM | POA: Diagnosis not present

## 2016-03-21 DIAGNOSIS — Z23 Encounter for immunization: Secondary | ICD-10-CM | POA: Diagnosis not present

## 2016-03-21 DIAGNOSIS — F39 Unspecified mood [affective] disorder: Secondary | ICD-10-CM | POA: Diagnosis not present

## 2016-03-21 DIAGNOSIS — M4327 Fusion of spine, lumbosacral region: Secondary | ICD-10-CM | POA: Diagnosis not present

## 2016-03-21 DIAGNOSIS — Z0000 Encounter for general adult medical examination without abnormal findings: Secondary | ICD-10-CM | POA: Diagnosis not present

## 2016-03-21 DIAGNOSIS — E7800 Pure hypercholesterolemia, unspecified: Secondary | ICD-10-CM | POA: Diagnosis not present

## 2016-04-04 ENCOUNTER — Encounter: Payer: Self-pay | Admitting: Family

## 2016-04-10 ENCOUNTER — Ambulatory Visit (INDEPENDENT_AMBULATORY_CARE_PROVIDER_SITE_OTHER): Payer: PPO | Admitting: Family

## 2016-04-10 ENCOUNTER — Ambulatory Visit (HOSPITAL_COMMUNITY)
Admission: RE | Admit: 2016-04-10 | Discharge: 2016-04-10 | Disposition: A | Discharge status: Home / Self Care | Payer: PPO | Source: Ambulatory Visit | Attending: Family | Admitting: Family

## 2016-04-10 ENCOUNTER — Encounter: Payer: Self-pay | Admitting: Family

## 2016-04-10 VITALS — BP 120/72 | HR 66 | Temp 97.5°F | Resp 20 | Ht 69.5 in | Wt 283.0 lb

## 2016-04-10 DIAGNOSIS — I714 Abdominal aortic aneurysm, without rupture, unspecified: Secondary | ICD-10-CM

## 2016-04-10 DIAGNOSIS — Z48812 Encounter for surgical aftercare following surgery on the circulatory system: Secondary | ICD-10-CM | POA: Diagnosis not present

## 2016-04-10 DIAGNOSIS — Z95828 Presence of other vascular implants and grafts: Secondary | ICD-10-CM | POA: Insufficient documentation

## 2016-04-10 DIAGNOSIS — R0989 Other specified symptoms and signs involving the circulatory and respiratory systems: Secondary | ICD-10-CM | POA: Diagnosis not present

## 2016-04-10 DIAGNOSIS — Z87891 Personal history of nicotine dependence: Secondary | ICD-10-CM | POA: Diagnosis not present

## 2016-04-10 NOTE — Patient Instructions (Addendum)
Before your next abdominal ultrasound:  Take two Extra-Strength Gas-X capsules at bedtime the night before the test. Take another two Extra-Strength Gas-X capsules 3 hours before the test.     Secondhand Smoke WHAT IS SECONDHAND SMOKE? Secondhand smoke is smoke that comes from burning tobacco. It could be the smoke from a cigarette, a pipe, or a cigar. Even if you are not the one smoking, secondhand smoke exposes you to the dangers of smoking. This is called involuntary, or passive, smoking. There are two types of secondhand smoke:  Sidestream smoke is the smoke that comes off the lighted end of a cigarette, pipe, or cigar.  This type of smoke has the highest amount of cancer-causing agents (carcinogens).  The particles in sidestream smoke are smaller. They get into your lungs more easily.  Mainstream smoke is the smoke that is exhaled by a person who is smoking.  This type of smoke is also dangerous to your health. HOW CAN SECONDHAND SMOKE AFFECT MY HEALTH? Studies show that there is no safe level of secondhand smoke. This smoke contains thousands of chemicals. At least 27 of them are known to cause cancer. Secondhand smoke can also cause many other health problems. It has been linked to:  Lung cancer.  Cancer of the voice box (larynx) or throat.  Cancer of the sinuses.  Brain cancer.  Bladder cancer.  Stomach cancer.  Breast cancer.  White blood cell cancers (lymphoma and leukemia).  Brain and liver tumors in children.  Heart disease and stroke in adults.  Pregnancy loss (miscarriage).  Diseases in children, such as:  Asthma.  Lung infections.  Ear infections.  Sudden infant death syndrome (SIDS).  Slow growth. WHERE CAN I BE AT RISK FOR EXPOSURE TO SECONDHAND SMOKE?  For adults, the workplace is the main source of exposure to secondhand smoke.  Your workplace should have a policy separating smoking areas from nonsmoking areas.  Smoking areas should  have a system for ventilating and cleaning the air.  For children, the home may be the most dangerous place for exposure to secondhand smoke.  Children who live in apartment buildings may be at risk from smoke drifting from hallways or other people's homes.  For everyone, many public places are possible sources of exposure to secondhand smoke.  These places include restaurants, shopping centers, and parks. HOW CAN I REDUCE MY RISK FOR EXPOSURE TO SECONDHAND SMOKE? The most important thing you can do is not smoke. Discourage family members from smoking. Other ways to reduce exposure for you and your family include the following:  Keep your home smoke free.  Make sure your child care providers do not smoke.  Warn your child about the dangers of smoking and secondhand smoke.  Do not allow smoking in your car. When someone smokes in a car, all the damaging chemicals from the smoke are confined in a small area.  Avoid public places where smoking is allowed. This information is not intended to replace advice given to you by your health care provider. Make sure you discuss any questions you have with your health care provider. Document Released: 04/06/2004 Document Revised: 10/07/2015 Document Reviewed: 06/13/2013 Elsevier Interactive Patient Education  2017 [employer].

## 2016-04-10 NOTE — Progress Notes (Signed)
[facility]  CC: Follow up s/p EVAR  History of Present Illness  Michael Lutz is a 71 y.o. (11-23-1945) male patient of Dr. Oneida Alar who presents for follow-up evaluation. 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 The patient denies new abdominal or back pain. He has chronic back pain from spine issues. The patient's atherosclerotic risk factors remain smoking, hyperlipidemia, hypertension. These are all currently stable and followed by his primary care physician. His aneurysm diameter was proximal 5 cm preoperatively.   He denies any new surgeries. He takes Lyrica for "nerve problem" in both legs. He started walking again for exercise, he is up to about 10 minutes daily.   Pt states he knows about his cardiac murmur, states he does not see a cardiologist. He denies dyspnea or chest pain.  He fell and fractured a left rib in December 2017; discovered at this time that a bolt is lose in the hardware in his lumbar spine; states this needs to be fixed.   He denies any history of TIA or stroke.  Pt Diabetic: No Pt smoker: former smoker, quit about August 2016, smoked for about 50 years. However, a friend that shares his house smokes in the house.   Past Medical History:  Diagnosis Date  . AAA (abdominal aortic aneurysm) ([place])   . ADD (attention deficit disorder)   . Anxiety    takes Clonazepam daily  . Arthritis    left knee  . Bipolar 1 disorder ([place])    takes Seroquel nightly  . Bipolar affective disorder ([place])    in remission on meds  . Blood transfusion    several times  . CHF (congestive heart failure) ([place])   . Chronic back pain    [facility]  . Chronic back pain    scoliosis  . Chronic low back pain 07/31/2013  . Colon polyps    Dr Cristina Gong does a colonoscopy every 5 years  . Constipation    takes Colace prn  . Depression    takes Effexor daily  . Dysrhythmia    palpitations-  sees dr Irish Lack  . Enlarged prostate    slightly  . Full dentures   . GERD (gastroesophageal reflux disease)    takes Zantac daily  . Heart murmur    arrythmia  . History of shingles   . Hx of scabies Mid Feb 2013  . Hx of seasonal allergies   . Hyperlipidemia    takes Niacin daily and Lipitor daily  . Hypertension    takes Amlodipine and Quinapril daily  . Insomnia    takes Trazodone nightly  . Joint pain   . Leg pain 05-04-10   with walking  . Memory loss    short and long term  . Nocturia   . Peripheral edema    takes Furosemide prn  . Peripheral vascular disease ([place])    s/p AAA stent  . Urinary urgency   . Wears glasses    Past Surgical History:  Procedure Laterality Date  . ABDOMINAL AORTIC ANEURYSM REPAIR  11/2009   Stent graft repair  . BACK SURGERY     thoracic spine  . CARDIAC CATHETERIZATION     15 yrs ago  . CHOLECYSTECTOMY    . COLONOSCOPY    . FOOT SURGERY     Multiple surgeries on right foot  . GANGLION CYST EXCISION Right 06/07/2012   Procedure: DISTAL POLE EXCISION RIGHT WRIST;  Surgeon:  Wynonia Sours, MD;  Location: [facility];  Service: Orthopedics;  Laterality: Right;  . SHOULDER SURGERY     Right shoulder  . SPINAL CORD STIMULATOR IMPLANT  2009  . SPINAL FUSION  04/04/2011   Procedure: FUSION POSTERIOR SPINAL MULTILEVEL/SCOLIOSIS;  Surgeon: Gunnar Bulla, MD;  Location: [facility];  Service: Orthopedics;  Laterality: N/A;  repair thoracic and lumbar  pseudoarthrosis, revise spinal cord stimulator, extend thoracic fusion, iliac crest bone graft  . SPINE SURGERY  12/2009   Spinal Fusion by Dr. Marcial Pacas  . TESTICLE SURGERY     right testicle  . TONSILLECTOMY    . TURP VAPORIZATION    . VASCULAR SURGERY     AAA stent repair   Social History Social History  Substance Use Topics  . Smoking status: Former Smoker    Packs/day: 0.50    Years: 50.00    Types: Cigars    Quit date: 10/08/2013  . Smokeless tobacco: Never Used     Comment:  states smokes "little cigars"  . Alcohol use No   Family History Family History  Problem Relation Age of Onset  . Cancer Mother     melanoma  . Diabetes Father   . Heart attack Father   . Arthritis Sister     rheumatoid  . Cancer Sister   . Anesthesia problems Neg Hx   . Hypotension Neg Hx   . Malignant hyperthermia Neg Hx   . Pseudochol deficiency Neg Hx    Current Outpatient Prescriptions on File Prior to Visit  Medication Sig Dispense Refill  . amLODipine (NORVASC) 10 MG tablet     . Ascorbic Acid (VITAMIN C) 1000 MG tablet Take 500 mg by mouth daily.     Marland Kitchen aspirin 325 MG tablet Take 162 mg by mouth daily.     Marland Kitchen atorvastatin (LIPITOR) 80 MG tablet Take 80 mg by mouth daily.    Marland Kitchen b complex vitamins tablet Take 1 tablet by mouth every morning.    . calcium carbonate (OS-CAL) 600 MG TABS Take 600 mg by mouth daily.      . cholecalciferol (VITAMIN D) 1000 UNITS tablet Take 2,000 Units by mouth every morning.     . clonazePAM (KLONOPIN) 1 MG tablet Take 2 mg by mouth at bedtime. 2 at night for sleep & 1 during the day for anxiety if needed    . fentaNYL (DURAGESIC - DOSED MCG/HR) 100 MCG/HR Place 100 mcg onto the skin every other day.     Marland Kitchen FLUZONE HIGH-DOSE 0.5 ML SUSY 0.5 mLs.  0  . furosemide (LASIX) 40 MG tablet Take 80 mg by mouth as needed. For fluid retention    . ibuprofen (ADVIL,MOTRIN) 200 MG tablet Take 200 mg by mouth every 6 (six) hours as needed. For pain    . LYRICA 225 MG capsule Take 225 mg by mouth 3 (three) times daily.     . magnesium oxide (MAG-OX) 400 MG tablet Take 400 mg by mouth daily.      . meclizine (ANTIVERT) 25 MG tablet Take 25 mg by mouth daily as needed for dizziness.     . methocarbamol (ROBAXIN) 750 MG tablet 750 mg 3 (three) times daily.     . Multiple Vitamin (MULTIVITAMIN) tablet Take 1 tablet by mouth daily.      . Potassium (GNP POTASSIUM) 99 MG TABS     . pseudoephedrine (SUDAFED) 30 MG tablet Take 30 mg by mouth every 4 (four) hours as  needed for congestion.    . QUEtiapine (SEROQUEL) 100 MG tablet Take 100 mg by mouth at bedtime.     . quinapril (ACCUPRIL) 40 MG tablet Take 80 mg by mouth daily.     . ranitidine (ZANTAC) 75 MG tablet Take 75 mg by mouth daily as needed for heartburn.    . RESTASIS 0.05 % ophthalmic emulsion     . simethicone (MYLICON) 0000000 MG chewable tablet Chew 125 mg by mouth as needed for flatulence.    . traZODone (DESYREL) 100 MG tablet Take 400 mg by mouth at bedtime.     Marland Kitchen venlafaxine XR (EFFEXOR-XR) 75 MG 24 hr capsule Take 225 mg by mouth daily with breakfast.     . vitamin E 1000 UNIT capsule Take 1,000 Units by mouth daily.    Marland Kitchen 5-Hydroxytryptophan (5-HTP) 100 MG CAPS Take 100 mg by mouth daily.    Marland Kitchen DHEA 50 MG CAPS Take 50 mg by mouth daily.    Marland Kitchen Lysine 1000 MG TABS Take 1,000 mg by mouth daily.    . niacin (NIASPAN) 500 MG CR tablet Take 500 mg by mouth at bedtime.     No current facility-administered medications on file prior to visit.    No Known Allergies   ROS: See HPI for pertinent positives and negatives.  Physical Examination  Vitals:   04/10/16 1444  BP: 120/72  Pulse: 66  Resp: 20  Temp: 97.5 F (36.4 C)  TempSrc: Oral  SpO2: 94%  Weight: 283 lb (128.4 kg)  Height: 5' 9.5" (1.765 m)   Body mass index is 41.19 kg/m.  General: A&O x 3, WD, morbidly obese male, using a cane to walk  Pulmonary: Sym exp, good air movt, CTAB, no rales, rhonchi, or wheezing   Cardiac: RRR, Nl S1, S2, + murmur  Vascular: Vessel Right Left  Radial 2+Palpable 2+Palpable  Carotid  without bruit  with bruit  Aorta Not palpable N/A  Femoral 1+Palpable 1+Palpable  Popliteal Not palpable Not palpable  PT 2+ Palpable 2+Palpable  DP not Palpable not Palpable   Gastrointestinal: soft, NTND, -G/R, - HSM, - palpable masses, - CVAT B.  Musculoskeletal: M/S 5/5 throughout, extremities without ischemic changes.  Neurologic: Pain and light touch intact in extremities, Motor exam as  listed above     CTA Abd/Pelvis Duplex (Date: 01-08-14) Pelvis: Tortuous atherosclerotic iliac vessels. Stent graft terminates in the common iliac arteries bilaterally. No iliac aneurysm or occlusive process. Visualized common femoral, proximal profunda femoral, superficial femoral arteries are patent. No pelvic free fluid, fluid collection, hemorrhage, abscess or adenopathy. No inguinal abnormality or significant hernia. Urinary bladder unremarkable. Postop changes of the lumbar spine and pelvis. IMPRESSION: Patent aortic stent graft repair of the previous infrarenal abdominal aortic aneurysm. Interval complete retraction of the native aneurysm sac around the stent graft. No residual measurable native aneurysm demonstrated. Negative for endoleak or retroperitoneal abnormality.    Non-Invasive Vascular Imaging  EVAR Duplex (Date: 04-10-16). Technically difficult exam due to overlying bowel gas.   AAA sac size: 3.0 cm; right CIA: 1.8 cm; Left CIA: 1.6 cm (not well visualized)  no endoleak detected   Same as CTA in 2015  Previous duplex performed on 01-22-2015 not valid due to limited visualization.    Medical Decision Making  CLEOPHAS VERDERAME is a 71 y.o. male who presents s/p EVAR (Date: September, 2011).  Pt is asymptomatic with stable (no) sac size.  I discussed with the patient the importance of surveillance of the  endograft.  The next endograft duplex will be scheduled for 12 months; will also schedule carotid duplex. He has a left carotid bruit with no history of stroke or TIA..  The patient will follow up with [place] in 12 months with these studies.  I emphasized the importance of maximal medical management including strict control of blood pressure, blood glucose, and lipid levels, antiplatelet agents, obtaining regular exercise, and cessation of smoking.   Thank you for allowing [place] to participate in this patient's care.  Clemon Chambers, RN, MSN, FNP-C [facility] Office: [phone]  Clinic Physician:   04/10/2016, 3:03 PM

## 2016-04-12 DIAGNOSIS — G894 Chronic pain syndrome: Secondary | ICD-10-CM | POA: Diagnosis not present

## 2016-04-12 DIAGNOSIS — Z79891 Long term (current) use of opiate analgesic: Secondary | ICD-10-CM | POA: Diagnosis not present

## 2016-04-12 DIAGNOSIS — M546 Pain in thoracic spine: Secondary | ICD-10-CM | POA: Diagnosis not present

## 2016-04-12 DIAGNOSIS — R079 Chest pain, unspecified: Secondary | ICD-10-CM | POA: Diagnosis not present

## 2016-05-30 DIAGNOSIS — K5520 Angiodysplasia of colon without hemorrhage: Secondary | ICD-10-CM | POA: Diagnosis not present

## 2016-05-30 DIAGNOSIS — D126 Benign neoplasm of colon, unspecified: Secondary | ICD-10-CM | POA: Diagnosis not present

## 2016-05-30 DIAGNOSIS — Z86010 Personal history of colonic polyps: Secondary | ICD-10-CM | POA: Diagnosis not present

## 2016-05-31 ENCOUNTER — Emergency Department (HOSPITAL_COMMUNITY): Payer: PPO

## 2016-05-31 ENCOUNTER — Inpatient Hospital Stay (HOSPITAL_COMMUNITY)
Admission: EM | Admit: 2016-05-31 | Discharge: 2016-06-05 | DRG: 093 | Disposition: A | Discharge status: Home / Self Care | Payer: PPO | Attending: Internal Medicine | Admitting: Internal Medicine

## 2016-05-31 ENCOUNTER — Encounter (HOSPITAL_COMMUNITY): Payer: Self-pay | Admitting: Emergency Medicine

## 2016-05-31 DIAGNOSIS — R410 Disorientation, unspecified: Secondary | ICD-10-CM

## 2016-05-31 DIAGNOSIS — Z7982 Long term (current) use of aspirin: Secondary | ICD-10-CM

## 2016-05-31 DIAGNOSIS — Z973 Presence of spectacles and contact lenses: Secondary | ICD-10-CM | POA: Diagnosis not present

## 2016-05-31 DIAGNOSIS — K7460 Unspecified cirrhosis of liver: Secondary | ICD-10-CM | POA: Diagnosis not present

## 2016-05-31 DIAGNOSIS — G9340 Encephalopathy, unspecified: Secondary | ICD-10-CM | POA: Diagnosis present

## 2016-05-31 DIAGNOSIS — R011 Cardiac murmur, unspecified: Secondary | ICD-10-CM | POA: Diagnosis present

## 2016-05-31 DIAGNOSIS — Z8619 Personal history of other infectious and parasitic diseases: Secondary | ICD-10-CM

## 2016-05-31 DIAGNOSIS — R112 Nausea with vomiting, unspecified: Secondary | ICD-10-CM | POA: Diagnosis not present

## 2016-05-31 DIAGNOSIS — M419 Scoliosis, unspecified: Secondary | ICD-10-CM | POA: Diagnosis not present

## 2016-05-31 DIAGNOSIS — Z981 Arthrodesis status: Secondary | ICD-10-CM

## 2016-05-31 DIAGNOSIS — K219 Gastro-esophageal reflux disease without esophagitis: Secondary | ICD-10-CM | POA: Diagnosis not present

## 2016-05-31 DIAGNOSIS — E785 Hyperlipidemia, unspecified: Secondary | ICD-10-CM | POA: Diagnosis present

## 2016-05-31 DIAGNOSIS — T404X5A Adverse effect of other synthetic narcotics, initial encounter: Secondary | ICD-10-CM | POA: Diagnosis present

## 2016-05-31 DIAGNOSIS — M545 Low back pain, unspecified: Secondary | ICD-10-CM | POA: Diagnosis present

## 2016-05-31 DIAGNOSIS — Z972 Presence of dental prosthetic device (complete) (partial): Secondary | ICD-10-CM

## 2016-05-31 DIAGNOSIS — Z87891 Personal history of nicotine dependence: Secondary | ICD-10-CM

## 2016-05-31 DIAGNOSIS — G92 Toxic encephalopathy: Secondary | ICD-10-CM | POA: Diagnosis not present

## 2016-05-31 DIAGNOSIS — I10 Essential (primary) hypertension: Secondary | ICD-10-CM | POA: Diagnosis not present

## 2016-05-31 DIAGNOSIS — Z86010 Personal history of colonic polyps: Secondary | ICD-10-CM

## 2016-05-31 DIAGNOSIS — I714 Abdominal aortic aneurysm, without rupture, unspecified: Secondary | ICD-10-CM | POA: Diagnosis present

## 2016-05-31 DIAGNOSIS — G4700 Insomnia, unspecified: Secondary | ICD-10-CM | POA: Diagnosis present

## 2016-05-31 DIAGNOSIS — T43595A Adverse effect of other antipsychotics and neuroleptics, initial encounter: Secondary | ICD-10-CM | POA: Diagnosis present

## 2016-05-31 DIAGNOSIS — K5900 Constipation, unspecified: Secondary | ICD-10-CM | POA: Diagnosis present

## 2016-05-31 DIAGNOSIS — T43015A Adverse effect of tricyclic antidepressants, initial encounter: Secondary | ICD-10-CM | POA: Diagnosis present

## 2016-05-31 DIAGNOSIS — R0602 Shortness of breath: Secondary | ICD-10-CM | POA: Diagnosis not present

## 2016-05-31 DIAGNOSIS — R402142 Coma scale, eyes open, spontaneous, at arrival to emergency department: Secondary | ICD-10-CM | POA: Diagnosis present

## 2016-05-31 DIAGNOSIS — Z9049 Acquired absence of other specified parts of digestive tract: Secondary | ICD-10-CM | POA: Diagnosis not present

## 2016-05-31 DIAGNOSIS — D696 Thrombocytopenia, unspecified: Secondary | ICD-10-CM | POA: Diagnosis present

## 2016-05-31 DIAGNOSIS — Y92019 Unspecified place in single-family (private) house as the place of occurrence of the external cause: Secondary | ICD-10-CM | POA: Diagnosis not present

## 2016-05-31 DIAGNOSIS — I509 Heart failure, unspecified: Secondary | ICD-10-CM

## 2016-05-31 DIAGNOSIS — Z8249 Family history of ischemic heart disease and other diseases of the circulatory system: Secondary | ICD-10-CM

## 2016-05-31 DIAGNOSIS — R002 Palpitations: Secondary | ICD-10-CM | POA: Diagnosis not present

## 2016-05-31 DIAGNOSIS — M1712 Unilateral primary osteoarthritis, left knee: Secondary | ICD-10-CM | POA: Diagnosis present

## 2016-05-31 DIAGNOSIS — R599 Enlarged lymph nodes, unspecified: Secondary | ICD-10-CM | POA: Diagnosis present

## 2016-05-31 DIAGNOSIS — Z79891 Long term (current) use of opiate analgesic: Secondary | ICD-10-CM

## 2016-05-31 DIAGNOSIS — T450X5A Adverse effect of antiallergic and antiemetic drugs, initial encounter: Secondary | ICD-10-CM | POA: Diagnosis present

## 2016-05-31 DIAGNOSIS — I739 Peripheral vascular disease, unspecified: Secondary | ICD-10-CM | POA: Diagnosis not present

## 2016-05-31 DIAGNOSIS — Z808 Family history of malignant neoplasm of other organs or systems: Secondary | ICD-10-CM

## 2016-05-31 DIAGNOSIS — Z8679 Personal history of other diseases of the circulatory system: Secondary | ICD-10-CM

## 2016-05-31 DIAGNOSIS — R479 Unspecified speech disturbances: Secondary | ICD-10-CM

## 2016-05-31 DIAGNOSIS — F988 Other specified behavioral and emotional disorders with onset usually occurring in childhood and adolescence: Secondary | ICD-10-CM | POA: Diagnosis not present

## 2016-05-31 DIAGNOSIS — R51 Headache: Secondary | ICD-10-CM

## 2016-05-31 DIAGNOSIS — Z95818 Presence of other cardiac implants and grafts: Secondary | ICD-10-CM

## 2016-05-31 DIAGNOSIS — R402362 Coma scale, best motor response, obeys commands, at arrival to emergency department: Secondary | ICD-10-CM | POA: Diagnosis present

## 2016-05-31 DIAGNOSIS — T428X5A Adverse effect of antiparkinsonism drugs and other central muscle-tone depressants, initial encounter: Secondary | ICD-10-CM | POA: Diagnosis present

## 2016-05-31 DIAGNOSIS — G8929 Other chronic pain: Secondary | ICD-10-CM | POA: Diagnosis present

## 2016-05-31 DIAGNOSIS — R519 Headache, unspecified: Secondary | ICD-10-CM

## 2016-05-31 DIAGNOSIS — R4789 Other speech disturbances: Secondary | ICD-10-CM | POA: Diagnosis not present

## 2016-05-31 DIAGNOSIS — Z79899 Other long term (current) drug therapy: Secondary | ICD-10-CM

## 2016-05-31 DIAGNOSIS — F419 Anxiety disorder, unspecified: Secondary | ICD-10-CM | POA: Diagnosis present

## 2016-05-31 DIAGNOSIS — Z833 Family history of diabetes mellitus: Secondary | ICD-10-CM

## 2016-05-31 DIAGNOSIS — I110 Hypertensive heart disease with heart failure: Secondary | ICD-10-CM | POA: Diagnosis not present

## 2016-05-31 DIAGNOSIS — F319 Bipolar disorder, unspecified: Secondary | ICD-10-CM | POA: Diagnosis present

## 2016-05-31 DIAGNOSIS — T43215A Adverse effect of selective serotonin and norepinephrine reuptake inhibitors, initial encounter: Secondary | ICD-10-CM | POA: Diagnosis present

## 2016-05-31 DIAGNOSIS — Z8261 Family history of arthritis: Secondary | ICD-10-CM

## 2016-05-31 DIAGNOSIS — R402242 Coma scale, best verbal response, confused conversation, at arrival to emergency department: Secondary | ICD-10-CM | POA: Diagnosis present

## 2016-05-31 DIAGNOSIS — N401 Enlarged prostate with lower urinary tract symptoms: Secondary | ICD-10-CM | POA: Diagnosis present

## 2016-05-31 LAB — I-STAT CHEM 8, ED
BUN: 17 mg/dL (ref 6–20)
Calcium, Ion: 1.23 mmol/L (ref 1.15–1.40)
Chloride: 107 mmol/L (ref 101–111)
Creatinine, Ser: 0.9 mg/dL (ref 0.61–1.24)
Glucose, Bld: 93 mg/dL (ref 65–99)
HCT: 50 % (ref 39.0–52.0)
Hemoglobin: 17 g/dL (ref 13.0–17.0)
Potassium: 4.4 mmol/L (ref 3.5–5.1)
Sodium: 141 mmol/L (ref 135–145)
TCO2: 24 mmol/L (ref 0–100)

## 2016-05-31 LAB — CBC
HCT: 50 % (ref 39.0–52.0)
Hemoglobin: 16.9 g/dL (ref 13.0–17.0)
MCH: 28.5 pg (ref 26.0–34.0)
MCHC: 33.8 g/dL (ref 30.0–36.0)
MCV: 84.2 fL (ref 78.0–100.0)
Platelets: 131 10*3/uL — ABNORMAL LOW (ref 150–400)
RBC: 5.94 MIL/uL — ABNORMAL HIGH (ref 4.22–5.81)
RDW: 15.2 % (ref 11.5–15.5)
WBC: 9.6 10*3/uL (ref 4.0–10.5)

## 2016-05-31 LAB — PROTIME-INR
INR: 1.1
Prothrombin Time: 14.2 seconds (ref 11.4–15.2)

## 2016-05-31 LAB — ETHANOL: Alcohol, Ethyl (B): <5 mg/dL (ref <5)

## 2016-05-31 LAB — BASIC METABOLIC PANEL
Anion gap: 14 (ref 5–15)
BUN: 13 mg/dL (ref 6–20)
CO2: 21 mmol/L — ABNORMAL LOW (ref 22–32)
Calcium: 10.5 mg/dL — ABNORMAL HIGH (ref 8.9–10.3)
Chloride: 105 mmol/L (ref 101–111)
Creatinine, Ser: 0.96 mg/dL (ref 0.61–1.24)
GFR calc Af Amer: >60 mL/min (ref >60)
GFR calc non Af Amer: >60 mL/min (ref >60)
Glucose, Bld: 93 mg/dL (ref 65–99)
Potassium: 4.1 mmol/L (ref 3.5–5.1)
Sodium: 140 mmol/L (ref 135–145)

## 2016-05-31 LAB — I-STAT TROPONIN, ED
Troponin i, poc: 0 ng/mL (ref 0.00–0.08)
Troponin i, poc: 0.01 ng/mL (ref 0.00–0.08)

## 2016-05-31 LAB — AMMONIA: Ammonia: 25 umol/L (ref 9–35)

## 2016-05-31 LAB — HEPATIC FUNCTION PANEL
ALT: 22 U/L (ref 17–63)
AST: 41 U/L (ref 15–41)
Albumin: 4.2 g/dL (ref 3.5–5.0)
Alkaline Phosphatase: 77 U/L (ref 38–126)
Bilirubin, Direct: 0.3 mg/dL (ref 0.1–0.5)
Indirect Bilirubin: 1.1 mg/dL — ABNORMAL HIGH (ref 0.3–0.9)
Total Bilirubin: 1.4 mg/dL — ABNORMAL HIGH (ref 0.3–1.2)
Total Protein: 7.4 g/dL (ref 6.5–8.1)

## 2016-05-31 LAB — I-STAT CG4 LACTIC ACID, ED: Lactic Acid, Venous: 2.22 mmol/L (ref 0.5–1.9)

## 2016-05-31 LAB — I-STAT VENOUS BLOOD GAS, ED
Acid-base deficit: 1 mmol/L (ref 0.0–2.0)
Bicarbonate: 18.2 mmol/L — ABNORMAL LOW (ref 20.0–28.0)
O2 Saturation: 93 %
TCO2: 19 mmol/L (ref 0–100)
pCO2, Ven: 20.4 mmHg — ABNORMAL LOW (ref 44.0–60.0)
pH, Ven: 7.56 — ABNORMAL HIGH (ref 7.250–7.430)
pO2, Ven: 56 mmHg — ABNORMAL HIGH (ref 32.0–45.0)

## 2016-05-31 LAB — TYPE AND SCREEN
ABO/RH(D): O POS
Antibody Screen: NEGATIVE

## 2016-05-31 LAB — APTT: aPTT: 39 seconds — ABNORMAL HIGH (ref 24–36)

## 2016-05-31 LAB — BRAIN NATRIURETIC PEPTIDE: B Natriuretic Peptide: 169.6 pg/mL — ABNORMAL HIGH (ref 0.0–100.0)

## 2016-05-31 MED ORDER — LORAZEPAM 2 MG/ML IJ SOLN
0.5000 mg | Freq: Once | INTRAMUSCULAR | Status: AC
  Administered 2016-05-31: 0.5 mg via INTRAVENOUS
  Filled 2016-05-31: qty 1

## 2016-05-31 MED ORDER — FUROSEMIDE 10 MG/ML IJ SOLN
40.0000 mg | Freq: Once | INTRAMUSCULAR | Status: AC
  Administered 2016-05-31: 40 mg via INTRAVENOUS
  Filled 2016-05-31: qty 4

## 2016-05-31 MED ORDER — MORPHINE SULFATE (PF) 4 MG/ML IV SOLN
4.0000 mg | Freq: Once | INTRAVENOUS | Status: AC
  Administered 2016-05-31: 4 mg via INTRAVENOUS
  Filled 2016-05-31: qty 1

## 2016-05-31 MED ORDER — LORAZEPAM 2 MG/ML IJ SOLN: 1.0000 mg | Freq: Once | INTRAMUSCULAR | Status: DC

## 2016-05-31 MED ORDER — IOPAMIDOL (ISOVUE-370) INJECTION 76%
INTRAVENOUS | Status: AC
  Administered 2016-05-31: 100 mL
  Filled 2016-05-31: qty 100

## 2016-05-31 NOTE — ED Notes (Signed)
Could not draw labs. Called phlebotomy. Will come

## 2016-05-31 NOTE — ED Notes (Signed)
Patient transported to CT 

## 2016-05-31 NOTE — ED Provider Notes (Signed)
[facility] DEPT Provider Note   CSN: [id number] Arrival date & time: 05/31/16  1639     History   Chief Complaint Chief Complaint  Patient presents with  . Shortness of Breath    HPI Michael Lutz is a 71 y.o. male.  HPI Pt started feeling bad maybe last night or this afternoon.   Pt is having trouble with neck pain, dizziness and nausea.   He is also having pain in his lower back.   He denies any pain in his chest.   He feels a little short of breath.  He is also having trouble with his speech.  He feels overheated and over all very poor.  Pt is having a difficult time communicating what is bothering him.   Past Medical History:  Diagnosis Date  . AAA (abdominal aortic aneurysm) ([place])   . ADD (attention deficit disorder)   . Anxiety    takes Clonazepam daily  . Arthritis    left knee  . Bipolar 1 disorder ([place])    takes Seroquel nightly  . Bipolar affective disorder ([place])    in remission on meds  . Blood transfusion    several times  . CHF (congestive heart failure) ([place])   . Chronic back pain    [facility]  . Chronic back pain    scoliosis  . Chronic low back pain 07/31/2013  . Colon polyps    Dr Cristina Gong does a colonoscopy every 5 years  . Constipation    takes Colace prn  . Depression    takes Effexor daily  . Dysrhythmia    palpitations- sees dr Irish Lack  . Enlarged prostate    slightly  . Full dentures   . GERD (gastroesophageal reflux disease)    takes Zantac daily  . Heart murmur    arrythmia  . History of shingles   . Hx of scabies Mid Feb 2013  . Hx of seasonal allergies   . Hyperlipidemia    takes Niacin daily and Lipitor daily  . Hypertension    takes Amlodipine and Quinapril daily  . Insomnia    takes Trazodone nightly  . Joint pain   . Leg pain 05-04-10   with walking  . Memory loss    short and long term  . Nocturia   . Peripheral edema    takes Furosemide prn  . Peripheral vascular disease ([place])    s/p AAA  stent  . Urinary urgency   . Wears glasses     Patient Active Problem List   Diagnosis Date Noted  . Abdominal aortic aneurysm ([place]) 01/08/2014  . Chronic low back pain 07/31/2013  . Abdominal aneurysm without mention of rupture 07/03/2013  . Dermatitis 04/10/2011    Past Surgical History:  Procedure Laterality Date  . ABDOMINAL AORTIC ANEURYSM REPAIR  11/2009   Stent graft repair  . BACK SURGERY     thoracic spine  . CARDIAC CATHETERIZATION     15 yrs ago  . CHOLECYSTECTOMY    . COLONOSCOPY    . FOOT SURGERY     Multiple surgeries on right foot  . GANGLION CYST EXCISION Right 06/07/2012   Procedure: DISTAL POLE EXCISION RIGHT WRIST;  Surgeon: Wynonia Sours, MD;  Location: [facility];  Service: Orthopedics;  Laterality: Right;  . SHOULDER SURGERY     Right shoulder  . SPINAL CORD STIMULATOR IMPLANT  2009  . SPINAL FUSION  04/04/2011   Procedure: FUSION POSTERIOR SPINAL  MULTILEVEL/SCOLIOSIS;  Surgeon: Gunnar Bulla, MD;  Location: [facility];  Service: Orthopedics;  Laterality: N/A;  repair thoracic and lumbar  pseudoarthrosis, revise spinal cord stimulator, extend thoracic fusion, iliac crest bone graft  . SPINE SURGERY  12/2009   Spinal Fusion by Dr. Marcial Pacas  . TESTICLE SURGERY     right testicle  . TONSILLECTOMY    . TURP VAPORIZATION    . VASCULAR SURGERY     AAA stent repair       Home Medications    Prior to Admission medications   Medication Sig Start Date End Date Taking? Authorizing Provider  amLODipine (NORVASC) 10 MG tablet Take 10 mg by mouth daily.  01/06/16  Yes Historical Provider, MD  atorvastatin (LIPITOR) 80 MG tablet Take 80 mg by mouth daily.   Yes Historical Provider, MD  clonazePAM (KLONOPIN) 0.5 MG tablet Take 0.25 mg by mouth every 12 (twelve) hours as needed for anxiety.    Yes Historical Provider, MD  doxepin (SINEQUAN) 25 MG capsule Take 50 mg by mouth at bedtime.  03/24/16  Yes Historical Provider, MD  fentaNYL (DURAGESIC - DOSED  MCG/HR) 100 MCG/HR Place 100 mcg onto the skin every other day.  07/10/13  Yes Historical Provider, MD  LYRICA 225 MG capsule Take 225 mg by mouth 3 (three) times daily.  03/02/16  Yes Historical Provider, MD  methocarbamol (ROBAXIN) 750 MG tablet Take 750 mg by mouth every 8 (eight) hours.  01/02/16  Yes Historical Provider, MD  QUEtiapine (SEROQUEL) 100 MG tablet Take 250 mg by mouth at bedtime.    Yes Historical Provider, MD  quinapril (ACCUPRIL) 40 MG tablet Take 80 mg by mouth daily.    Yes Historical Provider, MD  RESTASIS 0.05 % ophthalmic emulsion Place 1 drop into both eyes 2 (two) times daily.  02/11/16  Yes Historical Provider, MD  traZODone (DESYREL) 100 MG tablet Take 400 mg by mouth at bedtime.    Yes Historical Provider, MD  venlafaxine XR (EFFEXOR-XR) 75 MG 24 hr capsule Take 225 mg by mouth daily with breakfast.  02/10/16  Yes Historical Provider, MD  5-Hydroxytryptophan (5-HTP) 100 MG CAPS Take 100 mg by mouth daily.    Historical Provider, MD  Ascorbic Acid (VITAMIN C) 1000 MG tablet Take 500 mg by mouth daily.     Historical Provider, MD  aspirin 325 MG tablet Take 162 mg by mouth daily.     Historical Provider, MD  b complex vitamins tablet Take 1 tablet by mouth every morning.    Historical Provider, MD  calcium carbonate (OS-CAL) 600 MG TABS Take 600 mg by mouth daily.      Historical Provider, MD  cholecalciferol (VITAMIN D) 1000 UNITS tablet Take 2,000 Units by mouth every morning.     Historical Provider, MD  DHEA 50 MG CAPS Take 50 mg by mouth daily.    Historical Provider, MD  diphenhydrAMINE (BENADRYL) 25 mg capsule Take 25 mg by mouth every 6 (six) hours as needed.    Historical Provider, MD  furosemide (LASIX) 40 MG tablet Take 80 mg by mouth as needed. For fluid retention    Historical Provider, MD  ibuprofen (ADVIL,MOTRIN) 200 MG tablet Take 200 mg by mouth every 6 (six) hours as needed. For pain    Historical Provider, MD  Lysine 1000 MG TABS Take 1,000 mg by mouth  daily.    Historical Provider, MD  magnesium oxide (MAG-OX) 400 MG tablet Take 400 mg by mouth daily.  Historical Provider, MD  meclizine (ANTIVERT) 25 MG tablet Take 25 mg by mouth daily as needed for dizziness.  10/14/08   Historical Provider, MD  Multiple Vitamin (MULTIVITAMIN) tablet Take 1 tablet by mouth daily.      Historical Provider, MD  niacin (NIASPAN) 500 MG CR tablet Take 500 mg by mouth at bedtime.    Historical Provider, MD  Potassium (GNP POTASSIUM) 99 MG TABS  10/14/08   Historical Provider, MD  pseudoephedrine (SUDAFED) 30 MG tablet Take 30 mg by mouth every 4 (four) hours as needed for congestion.    Historical Provider, MD  ranitidine (ZANTAC) 75 MG tablet Take 75 mg by mouth daily as needed for heartburn.    Historical Provider, MD  simethicone (MYLICON) 027 MG chewable tablet Chew 125 mg by mouth as needed for flatulence.    Historical Provider, MD  vitamin E 1000 UNIT capsule Take 1,000 Units by mouth daily.    Historical Provider, MD    Family History Family History  Problem Relation Age of Onset  . Cancer Mother     melanoma  . Diabetes Father   . Heart attack Father   . Arthritis Sister     rheumatoid  . Cancer Sister   . Anesthesia problems Neg Hx   . Hypotension Neg Hx   . Malignant hyperthermia Neg Hx   . Pseudochol deficiency Neg Hx     Social History Social History  Substance Use Topics  . Smoking status: Former Smoker    Packs/day: 0.50    Years: 50.00    Types: Cigars    Quit date: 10/08/2013  . Smokeless tobacco: Never Used     Comment: states smokes "little cigars"  . Alcohol use No     Allergies   Patient has no known allergies.   Review of Systems Review of Systems  Respiratory: Positive for shortness of breath. Negative for cough.   Genitourinary: Positive for dysuria.  Neurological: Positive for speech difficulty and headaches. Negative for seizures and numbness.     Physical Exam Updated Vital Signs BP (!) 155/102   Pulse  81   Temp 97.8 F (36.6 C) (Oral)   Resp (!) 21   SpO2 98%   Physical Exam  Constitutional: He appears well-developed and well-nourished. He appears distressed.  HENT:  Head: Normocephalic and atraumatic.  Right Ear: External ear normal.  Left Ear: External ear normal.  Eyes: Conjunctivae are normal. Right eye exhibits no discharge. Left eye exhibits no discharge. No scleral icterus.  Neck: Neck supple. No tracheal deviation present.  Cardiovascular: Normal rate, regular rhythm and intact distal pulses.   Pulmonary/Chest: Effort normal and breath sounds normal. No stridor. No respiratory distress. He has no wheezes. He has no rales.  Abdominal: Soft. Bowel sounds are normal. He exhibits no distension. There is no tenderness. There is no rebound and no guarding.  Musculoskeletal: He exhibits no edema or tenderness.  Neurological: He is alert. No cranial nerve deficit (no facial droop, extraocular movements intact, no slurred speech) or sensory deficit. He exhibits normal muscle tone. He displays no seizure activity. GCS eye subscore is 4. GCS verbal subscore is 4. GCS motor subscore is 6.  Moves all extremities equally, equal grip and plantar flexion bilaterally  Skin: Skin is warm. No rash noted. He is diaphoretic.  Psychiatric: He has a normal mood and affect.  Nursing note and vitals reviewed.    ED Treatments / Results  Labs (all labs ordered are listed, but  only abnormal results are displayed) Labs Reviewed  CBC - Abnormal; Notable for the following:       Result Value   RBC 5.94 (*)    Platelets 131 (*)    All other components within normal limits  BASIC METABOLIC PANEL - Abnormal; Notable for the following:    CO2 21 (*)    Calcium 10.5 (*)    All other components within normal limits  APTT - Abnormal; Notable for the following:    aPTT 39 (*)    All other components within normal limits  I-STAT CG4 LACTIC ACID, ED - Abnormal; Notable for the following:    Lactic  Acid, Venous 2.22 (*)    All other components within normal limits  I-STAT VENOUS BLOOD GAS, ED - Abnormal; Notable for the following:    pH, Ven 7.560 (*)    pCO2, Ven 20.4 (*)    pO2, Ven 56.0 (*)    Bicarbonate 18.2 (*)    All other components within normal limits  CULTURE, BLOOD (ROUTINE X 2)  CULTURE, BLOOD (ROUTINE X 2)  PROTIME-INR  AMMONIA  ETHANOL  HEPATIC FUNCTION PANEL  BRAIN NATRIURETIC PEPTIDE  LIPASE, BLOOD  I-STAT TROPOININ, ED  I-STAT CHEM 8, ED  I-STAT TROPOININ, ED  I-STAT ARTERIAL BLOOD GAS, ED  TYPE AND SCREEN    EKG  EKG Interpretation  Date/Time:  Wednesday May 31 2016 17:09:15 EDT Ventricular Rate:  80 PR Interval:  196 QRS Duration: 120 QT Interval:  400 QTC Calculation: 461 R Axis:   -16 Text Interpretation:  Normal sinus rhythm RSR' or QR pattern in V1 suggests right ventricular conduction delay Left ventricular hypertrophy with QRS widening Inferior infarct , age undetermined Abnormal ECG t wave changes in lead III new since last tracing Confirmed by Alexina Niccoli  MD-J, Miana Politte ([id number]) on 05/31/2016 5:22:27 PM       Radiology Ct Head Wo Contrast  Result Date: 05/31/2016 CLINICAL DATA:  Headache. Neck and back pain. Chest pain. Confusion beginning this morning. EXAM: CT HEAD WITHOUT CONTRAST TECHNIQUE: Contiguous axial images were obtained from the base of the skull through the vertex without intravenous contrast. COMPARISON:  None. FINDINGS: Brain: Moderate generalized atrophy is stable. Prominence of the extra-axial spaces, asymmetric on the left, is also stable. No acute cortical infarct, hemorrhage, or mass lesion is present. The ventricles are proportionate to the degree of atrophy. The basal ganglia and insular ribbon are intact. The brainstem and cerebellum are unremarkable. Vascular: Atherosclerotic calcifications are present within the cavernous internal carotid arteries bilaterally. No focal hyperdense vessel is present. There is moderate density of  the vascularity diffusely compatible with a relatively hemo concentrated state. Skull: No focal lytic or blastic lesions are present. The calvarium is intact. Sinuses/Orbits: The paranasal sinuses are clear. Wall thickening in the maxillary sinuses bilaterally, right greater than left, suggests a history of chronic disease. There is no active disease. Minimal fluid is present in the inferior right mastoid air cells the mastoid air cells and middle ear cavities are otherwise clear. No obstructing lesion is present. IMPRESSION: 1. Moderate generalized atrophy with slight progression since the prior exam. 2. No acute intracranial abnormality. 3. Atherosclerosis. Electronically Signed   By: San Morelle M.D.   On: 05/31/2016 20:14   Dg Chest Portable 1 View  Result Date: 05/31/2016 CLINICAL DATA:  Colonoscopy yesterday. Today with shortness of breath, disoriented, weakness, nausea. EXAM: PORTABLE CHEST 1 VIEW COMPARISON:  Chest x-rays dated 03/02/2016 and 02/16/2012. FINDINGS: Heart size and  mediastinal contours appear stable. There is new central pulmonary vascular congestion and bilateral pulmonary edema. Suspect underlying pulmonary artery hypertension as well. No pleural effusion or pneumothorax seen. No acute or suspicious osseous finding. Fixation rods appear grossly stable in position within the thoracic spine. IMPRESSION: 1. New central pulmonary vascular congestion and bilateral pulmonary edema suggesting CHF/volume overload. 2.  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 Electronically Signed   By: Franki Cabot M.D.   On: 05/31/2016 18:28   Ct Angio Chest/abd/pel For Dissection W And/or Wo Contrast  Result Date: 05/31/2016 CLINICAL DATA:  Acute onset of headache, neck pain, back pain, chest pain, shortness of breath and confusion. Initial encounter. EXAM: CT ANGIOGRAPHY CHEST, ABDOMEN AND PELVIS TECHNIQUE: Multidetector CT imaging through the chest, abdomen and pelvis was performed using  the standard protocol during bolus administration of intravenous contrast. Multiplanar reconstructed images and MIPs were obtained and reviewed to evaluate the vascular anatomy. CONTRAST:  100 mL of Isovue 370 IV contrast COMPARISON:  Chest radiograph performed earlier today at 6:03 p.m., CTA of the abdomen and pelvis performed 01/08/2014, and CTA of the chest performed 07/28/2011 FINDINGS: CTA CHEST FINDINGS Cardiovascular: Scattered calcification is noted along the aortic arch and descending thoracic aorta. There is no evidence of aneurysmal dilatation. There is no evidence of aortic dissection. The great vessels are grossly unremarkable in appearance, aside from minimal calcification. There is no evidence of significant pulmonary embolus. Diffuse coronary artery calcifications are seen. Mediastinum/Nodes: An enlarged 2.1 cm right paratracheal node is seen. No additional mediastinal lymphadenopathy is appreciated. No pericardial effusion is identified. The thyroid gland is grossly unremarkable in appearance. No axillary lymphadenopathy is seen. Lungs/Pleura: Bilateral emphysematous change is noted. No focal consolidation, pleural effusion or pneumothorax is seen. No masses are identified. Musculoskeletal: No acute osseous abnormalities are seen. The visualized musculature is grossly unremarkable. Review of the MIP images confirms the above findings. CTA ABDOMEN AND PELVIS FINDINGS VASCULAR Aorta: An aortoiliac stent graft is fully patent. Scattered calcification is seen along the abdominal aorta and its branches. Celiac: Minimal calcification is noted at the origin of the celiac trunk. Diffuse calcification is seen along the splenic artery. SMA: The superior mesenteric artery remains patent, with minimal calcification at the origin of the superior mesenteric artery. Renals: Mild calcification is noted along the proximal left renal artery. The renal arteries are otherwise unremarkable. IMA: The inferior mesenteric  artery is not visualized. Inflow: Scattered calcification is seen along the common, internal and external iliac arteries bilaterally. Evaluation of the arterial system distal to the common femoral arteries is limited due to limitations in the timing of the contrast bolus. Scattered calcification is seen along the superficial femoral arteries bilaterally, along the popliteal arteries, and extending distally into both calves. The vasculature is not well assessed at the level of the calves. The profunda femoris arteries appear grossly intact bilaterally. Veins: Visualized venous structures are grossly unremarkable, though difficult to fully assess given the phase of contrast enhancement. Review of the MIP images confirms the above findings. NON-VASCULAR Hepatobiliary: The mildly nodular contour of the liver raises concern for hepatic cirrhosis. Clips are seen at the gallbladder fossa. The common bile duct is normal in caliber. Pancreas: The pancreas is within normal limits. Spleen: The spleen is unremarkable in appearance. Adrenals/Urinary Tract: There is nodularity about both adrenal glands, measuring up to 3.0 cm on the left. This appears relatively stable from 2013 and is likely benign. Numerous bilateral renal cysts are seen. Nonspecific perinephric stranding is noted bilaterally.  A nonobstructing 5 mm stone is noted at the interpole region of the left kidney. No hydronephrosis is seen. No obstructing renal stones are identified. Stomach/Bowel: The stomach is unremarkable in appearance. The small bowel is within normal limits. The appendix is not visualized; there is no evidence for appendicitis. Scattered diverticulosis is noted along the ascending colon. The colon is otherwise unremarkable. Lymphatic: No retroperitoneal or pelvic sidewall lymphadenopathy is seen. Reproductive: The bladder is mildly distended and grossly unremarkable. The prostate remains normal in size, with minimal calcification. Other: The  visualized portions of the lower extremities are grossly unremarkable in appearance. No knee joint effusions are seen. Vague soft tissue edema is noted tracking about both ankles. No focal fluid collections are seen. Musculoskeletal: No acute osseous abnormalities are identified. Thoracic spinal stimulation leads are noted. The patient is status post thoracolumbar spinal fusion. The visualized musculature is unremarkable in appearance. Review of the MIP images confirms the above findings. IMPRESSION: 1. Limited evaluation of arterial flow to the lower extremities due to limitations in the timing of the contrast bolus. The arterial system remains fully patent to at least the level of the common femoral arteries bilaterally. 2. Aortoiliac stent graft is fully patent. Scattered aortic atherosclerosis noted. 3. No evidence of aortic dissection. No evidence of aneurysmal dilatation. 4. No evidence of significant pulmonary embolus. 5. Vague soft tissue edema noted tracking about both ankles. Lower extremities are otherwise grossly unremarkable, though the toes are not imaged on this study, and the vasculature of the lower extremities is not well assessed, aside from scattered calcific atherosclerotic disease. 6. Enlarged 2.1 cm right paratracheal node noted, of uncertain significance. This is somewhat more prominent than in 2013. 7. Diffuse coronary artery calcifications seen. 8. Bilateral emphysema noted. 9. Mild changes of hepatic cirrhosis. 10. Nodularity about both adrenal glands appears relatively stable from 2013 and is likely benign. 11. Numerous bilateral renal cysts. Nonobstructing 5 mm stone at the interpole region of the left kidney. 12. Scattered diverticulosis along the ascending colon, without evidence of diverticulitis. Electronically Signed   By: Garald Balding M.D.   On: 05/31/2016 21:17    Procedures .Critical Care Performed by: Dorie Rank Authorized by: Dorie Rank   Critical care provider  statement:    Critical care time (minutes):  45   Critical care was time spent personally by me on the following activities:  Discussions with consultants, evaluation of patient's response to treatment, examination of patient, ordering and performing treatments and interventions, ordering and review of laboratory studies, ordering and review of radiographic studies, pulse oximetry, re-evaluation of patient's condition, obtaining history from patient or surrogate and review of old charts   (including critical care time)  Medications Ordered in ED Medications  morphine 4 MG/ML injection 4 mg (4 mg Intravenous Given 05/31/16 1757)  morphine 4 MG/ML injection 4 mg (4 mg Intravenous Given 05/31/16 1905)  iopamidol (ISOVUE-370) 76 % injection (100 mLs  Contrast Given 05/31/16 1945)  LORazepam (ATIVAN) injection 0.5 mg (0.5 mg Intravenous Given 05/31/16 2059)  furosemide (LASIX) injection 40 mg (40 mg Intravenous Given 05/31/16 2133)     Initial Impression / Assessment and Plan / ED Course  I have reviewed the triage vital signs and the nursing notes.  Pertinent labs & imaging results that were available during my care of the patient were reviewed by me and considered in my medical decision making (see chart for details).  Clinical Course as of May 31 2145  Wed May 31, 2016  1831 Sx are concerning for possible vascular etiology, ie dissection.  Labs and scans ordered.  Nurses are trying to get access for imaging  [JK]  2016 Pt still appears very uncomfortable, diaphoretic.  Labs without significant abnormalities to account for his symptoms.  CT scan is pending  [JK]  2144 Somewhat more comfortable after a dose of Ativan  [JK]    Clinical Course User Index [JK] Dorie Rank, MD   Patient presents to emergency room with several concerning symptoms. Initially I was concerned about the possibility of an aortic dissection considering his complaints of neck pain, shortness of breath and back pain.  His CT  scans ultimately did not show any evidence of pulmonary and was on, aortic dissection, or other acute pathology. Laboratory tests are notable for a mildly elevated lactic acid level however he is not febrile think does not have an elevated white blood cell count.  There is no evidence to suggest hepatic encephalopathy.  Patient is confused however. He may benefit from MRI to evaluate the possibility of an occult stroke.  Chest x-ray does show signs of pulmonary edema. I ordered dose of Lasix. We'll consult with medical service for admission and further evaluation.   Final Clinical Impressions(s) / ED Diagnoses   Final diagnoses:  Acute congestive heart failure, unspecified congestive heart failure type ([place])  Confusion  Difficulty with speech    New Prescriptions New Prescriptions   No medications on file     Dorie Rank, MD 05/31/16 2147

## 2016-05-31 NOTE — ED Notes (Signed)
Fentanyl patch on Rt wrist

## 2016-05-31 NOTE — ED Notes (Signed)
Placed condom cath on pt 

## 2016-05-31 NOTE — Progress Notes (Signed)
Unable to obtain ABG, RN made aware.

## 2016-05-31 NOTE — ED Triage Notes (Signed)
Pt had colonoscopy yesterday. Today pt has been SOB, feeling disoriented, weak all over, nauseous. Denies bleeding.

## 2016-05-31 NOTE — ED Notes (Signed)
Pt arrived from triage. Accessory muscle use, forgetful, confused at times, c/o pain in the back, neck, difficulty to speak. Family at the bedside. MD at the bedside. IV placed. New orders placed.

## 2016-05-31 NOTE — ED Notes (Signed)
Per nurse draw repeat labs at midnight

## 2016-05-31 NOTE — ED Notes (Signed)
Patient complaining of being cold, hot intermittently. Temperature adjusted in the room, warm blankets at the bedside if needed

## 2016-06-01 ENCOUNTER — Encounter (HOSPITAL_COMMUNITY): Payer: Self-pay | Admitting: Internal Medicine

## 2016-06-01 DIAGNOSIS — Z86010 Personal history of colonic polyps: Secondary | ICD-10-CM | POA: Diagnosis not present

## 2016-06-01 DIAGNOSIS — G92 Toxic encephalopathy: Secondary | ICD-10-CM | POA: Diagnosis not present

## 2016-06-01 DIAGNOSIS — Z95818 Presence of other cardiac implants and grafts: Secondary | ICD-10-CM | POA: Diagnosis not present

## 2016-06-01 DIAGNOSIS — K5900 Constipation, unspecified: Secondary | ICD-10-CM | POA: Diagnosis not present

## 2016-06-01 DIAGNOSIS — M419 Scoliosis, unspecified: Secondary | ICD-10-CM | POA: Diagnosis not present

## 2016-06-01 DIAGNOSIS — Z981 Arthrodesis status: Secondary | ICD-10-CM | POA: Diagnosis not present

## 2016-06-01 DIAGNOSIS — R002 Palpitations: Secondary | ICD-10-CM | POA: Diagnosis not present

## 2016-06-01 DIAGNOSIS — D696 Thrombocytopenia, unspecified: Secondary | ICD-10-CM | POA: Diagnosis not present

## 2016-06-01 DIAGNOSIS — I10 Essential (primary) hypertension: Secondary | ICD-10-CM

## 2016-06-01 DIAGNOSIS — Z973 Presence of spectacles and contact lenses: Secondary | ICD-10-CM | POA: Diagnosis not present

## 2016-06-01 DIAGNOSIS — K219 Gastro-esophageal reflux disease without esophagitis: Secondary | ICD-10-CM | POA: Diagnosis not present

## 2016-06-01 DIAGNOSIS — R011 Cardiac murmur, unspecified: Secondary | ICD-10-CM | POA: Diagnosis not present

## 2016-06-01 DIAGNOSIS — E785 Hyperlipidemia, unspecified: Secondary | ICD-10-CM | POA: Diagnosis not present

## 2016-06-01 DIAGNOSIS — Z8619 Personal history of other infectious and parasitic diseases: Secondary | ICD-10-CM | POA: Diagnosis not present

## 2016-06-01 DIAGNOSIS — R599 Enlarged lymph nodes, unspecified: Secondary | ICD-10-CM | POA: Diagnosis not present

## 2016-06-01 DIAGNOSIS — G9340 Encephalopathy, unspecified: Secondary | ICD-10-CM

## 2016-06-01 DIAGNOSIS — I714 Abdominal aortic aneurysm, without rupture: Secondary | ICD-10-CM | POA: Diagnosis not present

## 2016-06-01 DIAGNOSIS — M545 Low back pain: Secondary | ICD-10-CM | POA: Diagnosis not present

## 2016-06-01 DIAGNOSIS — Z972 Presence of dental prosthetic device (complete) (partial): Secondary | ICD-10-CM | POA: Diagnosis not present

## 2016-06-01 DIAGNOSIS — R410 Disorientation, unspecified: Secondary | ICD-10-CM | POA: Diagnosis not present

## 2016-06-01 DIAGNOSIS — K5520 Angiodysplasia of colon without hemorrhage: Secondary | ICD-10-CM | POA: Diagnosis not present

## 2016-06-01 DIAGNOSIS — I509 Heart failure, unspecified: Secondary | ICD-10-CM | POA: Diagnosis not present

## 2016-06-01 DIAGNOSIS — I739 Peripheral vascular disease, unspecified: Secondary | ICD-10-CM | POA: Diagnosis not present

## 2016-06-01 DIAGNOSIS — F419 Anxiety disorder, unspecified: Secondary | ICD-10-CM | POA: Diagnosis not present

## 2016-06-01 DIAGNOSIS — Y92019 Unspecified place in single-family (private) house as the place of occurrence of the external cause: Secondary | ICD-10-CM | POA: Diagnosis not present

## 2016-06-01 DIAGNOSIS — F319 Bipolar disorder, unspecified: Secondary | ICD-10-CM | POA: Diagnosis not present

## 2016-06-01 DIAGNOSIS — R479 Unspecified speech disturbances: Secondary | ICD-10-CM | POA: Diagnosis not present

## 2016-06-01 DIAGNOSIS — Z9049 Acquired absence of other specified parts of digestive tract: Secondary | ICD-10-CM | POA: Diagnosis not present

## 2016-06-01 DIAGNOSIS — R112 Nausea with vomiting, unspecified: Secondary | ICD-10-CM | POA: Diagnosis present

## 2016-06-01 DIAGNOSIS — D126 Benign neoplasm of colon, unspecified: Secondary | ICD-10-CM | POA: Diagnosis not present

## 2016-06-01 DIAGNOSIS — G4700 Insomnia, unspecified: Secondary | ICD-10-CM | POA: Diagnosis not present

## 2016-06-01 DIAGNOSIS — F988 Other specified behavioral and emotional disorders with onset usually occurring in childhood and adolescence: Secondary | ICD-10-CM | POA: Diagnosis not present

## 2016-06-01 DIAGNOSIS — M1712 Unilateral primary osteoarthritis, left knee: Secondary | ICD-10-CM | POA: Diagnosis not present

## 2016-06-01 LAB — COMPREHENSIVE METABOLIC PANEL
ALT: 23 U/L (ref 17–63)
AST: 46 U/L — ABNORMAL HIGH (ref 15–41)
Albumin: 4.1 g/dL (ref 3.5–5.0)
Alkaline Phosphatase: 74 U/L (ref 38–126)
Anion gap: 17 — ABNORMAL HIGH (ref 5–15)
BUN: 15 mg/dL (ref 6–20)
CO2: 17 mmol/L — ABNORMAL LOW (ref 22–32)
Calcium: 10.2 mg/dL (ref 8.9–10.3)
Chloride: 106 mmol/L (ref 101–111)
Creatinine, Ser: 1.01 mg/dL (ref 0.61–1.24)
GFR calc Af Amer: >60 mL/min (ref >60)
GFR calc non Af Amer: >60 mL/min (ref >60)
Glucose, Bld: 94 mg/dL (ref 65–99)
Potassium: 3.7 mmol/L (ref 3.5–5.1)
Sodium: 140 mmol/L (ref 135–145)
Total Bilirubin: 1.4 mg/dL — ABNORMAL HIGH (ref 0.3–1.2)
Total Protein: 7.4 g/dL (ref 6.5–8.1)

## 2016-06-01 LAB — URINALYSIS, ROUTINE W REFLEX MICROSCOPIC
Bilirubin Urine: NEGATIVE
Glucose, UA: NEGATIVE mg/dL
Hgb urine dipstick: NEGATIVE
Ketones, ur: NEGATIVE mg/dL
Leukocytes, UA: NEGATIVE
Nitrite: NEGATIVE
Protein, ur: NEGATIVE mg/dL
Specific Gravity, Urine: 1.012 (ref 1.005–1.030)
pH: 6 (ref 5.0–8.0)

## 2016-06-01 LAB — MRSA PCR SCREENING: MRSA by PCR: NEGATIVE

## 2016-06-01 LAB — CBC
HCT: 50.1 % (ref 39.0–52.0)
Hemoglobin: 17.4 g/dL — ABNORMAL HIGH (ref 13.0–17.0)
MCH: 29.2 pg (ref 26.0–34.0)
MCHC: 34.7 g/dL (ref 30.0–36.0)
MCV: 84.1 fL (ref 78.0–100.0)
Platelets: 130 10*3/uL — ABNORMAL LOW (ref 150–400)
RBC: 5.96 MIL/uL — ABNORMAL HIGH (ref 4.22–5.81)
RDW: 15.6 % — ABNORMAL HIGH (ref 11.5–15.5)
WBC: 10.1 10*3/uL (ref 4.0–10.5)

## 2016-06-01 LAB — RAPID URINE DRUG SCREEN, HOSP PERFORMED
Amphetamines: NOT DETECTED
Barbiturates: NOT DETECTED
Benzodiazepines: NOT DETECTED
Cocaine: NOT DETECTED
Opiates: POSITIVE — AB
Tetrahydrocannabinol: NOT DETECTED

## 2016-06-01 LAB — LIPASE, BLOOD: Lipase: 13 U/L (ref 11–51)

## 2016-06-01 MED ORDER — DOXEPIN HCL 50 MG PO CAPS
50.0000 mg | ORAL_CAPSULE | Freq: Every day | ORAL | Status: DC
  Administered 2016-06-01: 50 mg via ORAL
  Filled 2016-06-01: qty 1

## 2016-06-01 MED ORDER — METHOCARBAMOL 500 MG PO TABS
500.0000 mg | ORAL_TABLET | Freq: Three times a day (TID) | ORAL | Status: DC | PRN
  Administered 2016-06-03: 500 mg via ORAL
  Filled 2016-06-01: qty 1

## 2016-06-01 MED ORDER — LYSINE 1000 MG PO TABS: 1000.0000 mg | ORAL_TABLET | Freq: Every day | ORAL | Status: DC

## 2016-06-01 MED ORDER — AMLODIPINE BESYLATE 10 MG PO TABS
10.0000 mg | ORAL_TABLET | Freq: Every day | ORAL | Status: DC
  Administered 2016-06-01 – 2016-06-05 (×5): 10 mg via ORAL
  Filled 2016-06-01 (×5): qty 1

## 2016-06-01 MED ORDER — CYCLOSPORINE 0.05 % OP EMUL
1.0000 [drp] | Freq: Two times a day (BID) | OPHTHALMIC | Status: DC
  Administered 2016-06-01 – 2016-06-05 (×9): 1 [drp] via OPHTHALMIC
  Filled 2016-06-01 (×9): qty 1

## 2016-06-01 MED ORDER — FAMOTIDINE 20 MG PO TABS
20.0000 mg | ORAL_TABLET | Freq: Two times a day (BID) | ORAL | Status: DC
  Administered 2016-06-01 – 2016-06-05 (×10): 20 mg via ORAL
  Filled 2016-06-01 (×10): qty 1

## 2016-06-01 MED ORDER — VITAMIN C 500 MG PO TABS
500.0000 mg | ORAL_TABLET | Freq: Every day | ORAL | Status: DC
  Administered 2016-06-01 – 2016-06-05 (×5): 500 mg via ORAL
  Filled 2016-06-01 (×5): qty 1

## 2016-06-01 MED ORDER — PREGABALIN 50 MG PO CAPS
225.0000 mg | ORAL_CAPSULE | Freq: Three times a day (TID) | ORAL | Status: DC
  Administered 2016-06-01: 225 mg via ORAL
  Filled 2016-06-01: qty 4

## 2016-06-01 MED ORDER — DOXEPIN HCL 25 MG PO CAPS
25.0000 mg | ORAL_CAPSULE | Freq: Every day | ORAL | Status: DC
  Administered 2016-06-01 – 2016-06-04 (×4): 25 mg via ORAL
  Filled 2016-06-01 (×4): qty 1

## 2016-06-01 MED ORDER — QUETIAPINE FUMARATE 25 MG PO TABS
100.0000 mg | ORAL_TABLET | Freq: Every day | ORAL | Status: DC
  Administered 2016-06-01 – 2016-06-04 (×4): 100 mg via ORAL
  Filled 2016-06-01: qty 1
  Filled 2016-06-01 (×3): qty 4

## 2016-06-01 MED ORDER — ASPIRIN 325 MG PO TABS
162.0000 mg | ORAL_TABLET | Freq: Every day | ORAL | Status: DC
  Administered 2016-06-01 – 2016-06-05 (×5): 162 mg via ORAL
  Filled 2016-06-01 (×5): qty 1

## 2016-06-01 MED ORDER — ACETAMINOPHEN 160 MG/5ML PO SOLN: 650.0000 mg | ORAL | Status: DC | PRN

## 2016-06-01 MED ORDER — CALCIUM CARBONATE 1250 (500 CA) MG PO TABS
1250.0000 mg | ORAL_TABLET | Freq: Every day | ORAL | Status: DC
Start: 2016-06-01 — End: 2016-06-05
  Administered 2016-06-02 – 2016-06-05 (×4): 1250 mg via ORAL
  Filled 2016-06-01 (×5): qty 1

## 2016-06-01 MED ORDER — DHEA 50 MG PO CAPS: 50.0000 mg | ORAL_CAPSULE | Freq: Every day | ORAL | Status: DC

## 2016-06-01 MED ORDER — VITAMIN E 45 MG (100 UNIT) PO CAPS
1000.0000 [IU] | ORAL_CAPSULE | Freq: Every day | ORAL | Status: DC
  Administered 2016-06-01: 800 [IU] via ORAL
  Administered 2016-06-02 – 2016-06-05 (×4): 1000 [IU] via ORAL
  Filled 2016-06-01 (×5): qty 2

## 2016-06-01 MED ORDER — LISINOPRIL 40 MG PO TABS
40.0000 mg | ORAL_TABLET | Freq: Two times a day (BID) | ORAL | Status: DC
  Administered 2016-06-01 – 2016-06-03 (×5): 40 mg via ORAL
  Filled 2016-06-01 (×3): qty 2
  Filled 2016-06-01 (×2): qty 1

## 2016-06-01 MED ORDER — ADULT MULTIVITAMIN W/MINERALS CH
1.0000 | ORAL_TABLET | Freq: Every day | ORAL | Status: DC
  Administered 2016-06-01 – 2016-06-05 (×5): 1 via ORAL
  Filled 2016-06-01 (×5): qty 1

## 2016-06-01 MED ORDER — VENLAFAXINE HCL ER 75 MG PO CP24
75.0000 mg | ORAL_CAPSULE | Freq: Every day | ORAL | Status: DC
Start: 2016-06-02 — End: 2016-06-05
  Administered 2016-06-02 – 2016-06-05 (×4): 75 mg via ORAL
  Filled 2016-06-01 (×4): qty 1

## 2016-06-01 MED ORDER — NIACIN ER (ANTIHYPERLIPIDEMIC) 500 MG PO TBCR
500.0000 mg | EXTENDED_RELEASE_TABLET | Freq: Every day | ORAL | Status: DC
  Administered 2016-06-01 – 2016-06-04 (×5): 500 mg via ORAL
  Filled 2016-06-01 (×5): qty 1

## 2016-06-01 MED ORDER — TRAZODONE HCL 50 MG PO TABS
400.0000 mg | ORAL_TABLET | Freq: Every day | ORAL | Status: DC
  Administered 2016-06-01: 400 mg via ORAL
  Filled 2016-06-01: qty 8

## 2016-06-01 MED ORDER — POLYVINYL ALCOHOL 1.4 % OP SOLN
2.0000 [drp] | OPHTHALMIC | Status: DC | PRN
  Administered 2016-06-01 – 2016-06-02 (×2): 2 [drp] via OPHTHALMIC
  Filled 2016-06-01: qty 15

## 2016-06-01 MED ORDER — VENLAFAXINE HCL ER 75 MG PO CP24
225.0000 mg | ORAL_CAPSULE | Freq: Every day | ORAL | Status: DC
  Administered 2016-06-01: 225 mg via ORAL
  Filled 2016-06-01: qty 1

## 2016-06-01 MED ORDER — VENLAFAXINE HCL ER 150 MG PO CP24: 150.0000 mg | ORAL_CAPSULE | Freq: Every day | ORAL | Status: DC

## 2016-06-01 MED ORDER — MAGNESIUM OXIDE 400 (241.3 MG) MG PO TABS
400.0000 mg | ORAL_TABLET | Freq: Every day | ORAL | Status: DC
  Administered 2016-06-01 – 2016-06-05 (×5): 400 mg via ORAL
  Filled 2016-06-01 (×5): qty 1

## 2016-06-01 MED ORDER — CLONAZEPAM 0.5 MG PO TABS
0.2500 mg | ORAL_TABLET | Freq: Two times a day (BID) | ORAL | Status: DC | PRN
  Administered 2016-06-01: 0.25 mg via ORAL
  Filled 2016-06-01 (×2): qty 1

## 2016-06-01 MED ORDER — METHOCARBAMOL 500 MG PO TABS
750.0000 mg | ORAL_TABLET | Freq: Three times a day (TID) | ORAL | Status: DC
  Administered 2016-06-01 (×2): 750 mg via ORAL
  Filled 2016-06-01 (×2): qty 2

## 2016-06-01 MED ORDER — STROKE: EARLY STAGES OF RECOVERY BOOK
Freq: Once | Status: AC
  Administered 2016-06-01: 03:00:00
  Filled 2016-06-01: qty 1

## 2016-06-01 MED ORDER — QUINAPRIL HCL 10 MG PO TABS
80.0000 mg | ORAL_TABLET | Freq: Every day | ORAL | Status: DC
  Filled 2016-06-01: qty 8

## 2016-06-01 MED ORDER — PREGABALIN 100 MG PO CAPS
100.0000 mg | ORAL_CAPSULE | Freq: Two times a day (BID) | ORAL | Status: DC
  Administered 2016-06-01 – 2016-06-05 (×8): 100 mg via ORAL
  Filled 2016-06-01 (×2): qty 1
  Filled 2016-06-01: qty 2
  Filled 2016-06-01 (×3): qty 1
  Filled 2016-06-01: qty 2
  Filled 2016-06-01: qty 1

## 2016-06-01 MED ORDER — ATORVASTATIN CALCIUM 80 MG PO TABS
80.0000 mg | ORAL_TABLET | Freq: Every day | ORAL | Status: DC
  Administered 2016-06-01 – 2016-06-04 (×4): 80 mg via ORAL
  Filled 2016-06-01 (×4): qty 1

## 2016-06-01 MED ORDER — 5-HTP 100 MG PO CAPS: 100.0000 mg | ORAL_CAPSULE | Freq: Every day | ORAL | Status: DC

## 2016-06-01 MED ORDER — ACETAMINOPHEN 650 MG RE SUPP: 650.0000 mg | RECTAL | Status: DC | PRN

## 2016-06-01 MED ORDER — ACETAMINOPHEN 325 MG PO TABS
650.0000 mg | ORAL_TABLET | ORAL | Status: DC | PRN
  Administered 2016-06-03 – 2016-06-05 (×2): 650 mg via ORAL
  Filled 2016-06-01 (×2): qty 2

## 2016-06-01 MED ORDER — FENTANYL 50 MCG/HR TD PT72
100.0000 ug | MEDICATED_PATCH | TRANSDERMAL | Status: DC
  Administered 2016-06-01 – 2016-06-05 (×3): 100 ug via TRANSDERMAL
  Filled 2016-06-01 (×3): qty 2

## 2016-06-01 MED ORDER — QUETIAPINE FUMARATE 50 MG PO TABS
250.0000 mg | ORAL_TABLET | Freq: Every day | ORAL | Status: DC
  Administered 2016-06-01: 250 mg via ORAL
  Filled 2016-06-01: qty 1

## 2016-06-01 MED ORDER — TRAZODONE HCL 100 MG PO TABS
100.0000 mg | ORAL_TABLET | Freq: Every day | ORAL | Status: DC
  Administered 2016-06-01 – 2016-06-04 (×4): 100 mg via ORAL
  Filled 2016-06-01: qty 1
  Filled 2016-06-01: qty 2
  Filled 2016-06-01 (×2): qty 1

## 2016-06-01 MED ORDER — MECLIZINE HCL 12.5 MG PO TABS: 25.0000 mg | ORAL_TABLET | Freq: Every day | ORAL | Status: DC | PRN

## 2016-06-01 NOTE — Progress Notes (Signed)
PHARMACIST - PHYSICIAN ORDER COMMUNICATION  CONCERNING: P&T Medication Policy on Herbal Medications  DESCRIPTION:  This patient's order for:  DHEA, Lysine, 5-HTP  has been noted.  This product(s) is classified as an "herbal" or natural product. Due to a lack of definitive safety studies or FDA approval, nonstandard manufacturing practices, plus the potential risk of unknown drug-drug interactions while on inpatient medications, the Pharmacy and Therapeutics Committee does not permit the use of "herbal" or natural products of this type within [facility].   ACTION TAKEN: The pharmacy department is unable to verify this order at this time and your patient has been informed of this safety policy. Please reevaluate patient's clinical condition at discharge and address if the herbal or natural product(s) should be resumed at that time.  Sherlon Handing, PharmD, BCPS Clinical pharmacist, pager [phone] 06/01/2016 12:49 AM

## 2016-06-01 NOTE — Progress Notes (Signed)
Pt seen and examined, [name] admitted with drowsiness/confusion, he had a COlonoscopy day prior to admission. CT abd unremarkable, :Labs, urine, ETOH level, CT head all unremarkable I suspect this is related to Polypharmacy, pt is on an alarmingly high doses of 10 or more sedating meds including Fentanyl 166mcg, Lyrica 223mg  TID(above max dose of 450mg ), Trazodone 400mg  daily, Seroquel 250mg  QD, Effexor 225mg  QD, Robaxin 750mg  TID, Meclizine 25mg  , Doxepin 50mg  QHS, Benadryl 25mg  QID PRN, Klonopin 0.25mg  BID PRN -I have cut down doses of Lyrica, Trazodone, Seroquel, Effexor, RObaxin, Doxepin  -will monitor closely, I will send a msg to his prescribing MDs regarding his medications -has chronic low back pain /h/o multiple back surgeries -will ask PT/OT to eval tomorrow  Domenic Polite, MD

## 2016-06-01 NOTE — Care Management Note (Signed)
Case Management Note  Patient Details  Name: [name] MRN: [id number] Date of Birth: 08-Sep-1945  Subjective/Objective:                    Action/Plan:   PTA from home alone - uses cane.  Pt has very slow responses and CM has requested PT/OT evaluation for mobility safety in the home.  Pt states he has close family and friends.     Expected Discharge Date:                  Expected Discharge Plan:  [place]  In-House Referral:     Discharge planning Services  CM Consult  Post Acute Care Choice:    Choice offered to:     DME Arranged:    DME Agency:     HH Arranged:    [place] Agency:     Status of Service:  In process, will continue to follow  If discussed at Long Length of Stay Meetings, dates discussed:    Additional Comments:  Maryclare Labrador, RN 06/01/2016, 10:42 AM

## 2016-06-01 NOTE — H&P (Signed)
History and Physical    Michael Lutz JOI:[id number] DOB: Dec 29, 1945 DOA: 05/31/2016  PCP: Tawanna Solo, MD  Patient coming from: Home.  Chief Complaint: Confusion.  HPI: Michael Lutz is a 71 y.o. male with history of hypertension, depression, abdominal attic aneurysm status post repair, possible CHF and chronic pain on Duragesic patch was brought to the ER after patient's friend found that patient was confused and diaphoretic. On March 20 patient had undergone colonoscopy and as per the friend patient had done fine. Yesterday morning patient's friend went to check on him and patient was found to be confused diaphoretic and complaining of abdominal pain. Patient denies any nausea vomiting or diarrhea. Denies any chest pain or shortness of breath.   ED Course: In the ER patient is found to be diaphoretic complaining of abdominal pain. Since patient has history of abdominal aortic aneurysm repair patient underwent CT angiogram of the chest abdomen and pelvis which did not show anything acute. Patient is being admitted for further observation. Patient on my exam is restless but oriented to time place and person. Moves all extremities.  Review of Systems: As per HPI, rest all negative.   Past Medical History:  Diagnosis Date  . AAA (abdominal aortic aneurysm) ([place])   . ADD (attention deficit disorder)   . Anxiety    takes Clonazepam daily  . Arthritis    left knee  . Bipolar 1 disorder ([place])    takes Seroquel nightly  . Bipolar affective disorder ([place])    in remission on meds  . Blood transfusion    several times  . CHF (congestive heart failure) ([place])   . Chronic back pain    [facility]  . Chronic back pain    scoliosis  . Chronic low back pain 07/31/2013  . Colon polyps    Dr Cristina Gong does a colonoscopy every 5 years  . Constipation    takes Colace prn  . Depression    takes Effexor daily  . Dysrhythmia    palpitations- sees dr Irish Lack  .  Enlarged prostate    slightly  . Full dentures   . GERD (gastroesophageal reflux disease)    takes Zantac daily  . Heart murmur    arrythmia  . History of shingles   . Hx of scabies Mid Feb 2013  . Hx of seasonal allergies   . Hyperlipidemia    takes Niacin daily and Lipitor daily  . Hypertension    takes Amlodipine and Quinapril daily  . Insomnia    takes Trazodone nightly  . Joint pain   . Leg pain 05-04-10   with walking  . Memory loss    short and long term  . Nocturia   . Peripheral edema    takes Furosemide prn  . Peripheral vascular disease ([place])    s/p AAA stent  . Urinary urgency   . Wears glasses     Past Surgical History:  Procedure Laterality Date  . ABDOMINAL AORTIC ANEURYSM REPAIR  11/2009   Stent graft repair  . BACK SURGERY     thoracic spine  . CARDIAC CATHETERIZATION     15 yrs ago  . CHOLECYSTECTOMY    . COLONOSCOPY    . FOOT SURGERY     Multiple surgeries on right foot  . GANGLION CYST EXCISION Right 06/07/2012   Procedure: DISTAL POLE EXCISION RIGHT WRIST;  Surgeon: Wynonia Sours, MD;  Location: [place];  Service: Orthopedics;  Laterality: Right;  . SHOULDER SURGERY     Right shoulder  . SPINAL CORD STIMULATOR IMPLANT  2009  . SPINAL FUSION  04/04/2011   Procedure: FUSION POSTERIOR SPINAL MULTILEVEL/SCOLIOSIS;  Surgeon: Gunnar Bulla, MD;  Location: [place];  Service: Orthopedics;  Laterality: N/A;  repair thoracic and lumbar  pseudoarthrosis, revise spinal cord stimulator, extend thoracic fusion, iliac crest bone graft  . SPINE SURGERY  12/2009   Spinal Fusion by Dr. Marcial Pacas  . TESTICLE SURGERY     right testicle  . TONSILLECTOMY    . TURP VAPORIZATION    . VASCULAR SURGERY     AAA stent repair     reports that he quit smoking about 2 years ago. His smoking use included Cigars. He has a 25.00 pack-year smoking history. He has never used smokeless tobacco. He reports that he does not drink alcohol or use drugs.  No Known  Allergies  Family History  Problem Relation Age of Onset  . Cancer Mother     melanoma  . Diabetes Father   . Heart attack Father   . Arthritis Sister     rheumatoid  . Cancer Sister   . Anesthesia problems Neg Hx   . Hypotension Neg Hx   . Malignant hyperthermia Neg Hx   . Pseudochol deficiency Neg Hx     Prior to Admission medications   Medication Sig Start Date End Date Taking? Authorizing Provider  amLODipine (NORVASC) 10 MG tablet Take 10 mg by mouth daily.  01/06/16  Yes Historical Provider, MD  atorvastatin (LIPITOR) 80 MG tablet Take 80 mg by mouth daily.   Yes Historical Provider, MD  clonazePAM (KLONOPIN) 0.5 MG tablet Take 0.25 mg by mouth every 12 (twelve) hours as needed for anxiety.    Yes Historical Provider, MD  doxepin (SINEQUAN) 25 MG capsule Take 50 mg by mouth at bedtime.  03/24/16  Yes Historical Provider, MD  fentaNYL (DURAGESIC - DOSED MCG/HR) 100 MCG/HR Place 100 mcg onto the skin every other day.  07/10/13  Yes Historical Provider, MD  LYRICA 225 MG capsule Take 225 mg by mouth 3 (three) times daily.  03/02/16  Yes Historical Provider, MD  methocarbamol (ROBAXIN) 750 MG tablet Take 750 mg by mouth every 8 (eight) hours.  01/02/16  Yes Historical Provider, MD  QUEtiapine (SEROQUEL) 100 MG tablet Take 250 mg by mouth at bedtime.    Yes Historical Provider, MD  quinapril (ACCUPRIL) 40 MG tablet Take 80 mg by mouth daily.    Yes Historical Provider, MD  RESTASIS 0.05 % ophthalmic emulsion Place 1 drop into both eyes 2 (two) times daily.  02/11/16  Yes Historical Provider, MD  traZODone (DESYREL) 100 MG tablet Take 400 mg by mouth at bedtime.    Yes Historical Provider, MD  venlafaxine XR (EFFEXOR-XR) 75 MG 24 hr capsule Take 225 mg by mouth daily with breakfast.  02/10/16  Yes Historical Provider, MD  5-Hydroxytryptophan (5-HTP) 100 MG CAPS Take 100 mg by mouth daily.    Historical Provider, MD  Ascorbic Acid (VITAMIN C) 1000 MG tablet Take 500 mg by mouth daily.      Historical Provider, MD  aspirin 325 MG tablet Take 162 mg by mouth daily.     Historical Provider, MD  b complex vitamins tablet Take 1 tablet by mouth every morning.    Historical Provider, MD  calcium carbonate (OS-CAL) 600 MG TABS Take 600 mg by mouth daily.      Historical Provider, MD  cholecalciferol (VITAMIN D) 1000 UNITS tablet Take 2,000 Units by mouth every morning.     Historical Provider, MD  DHEA 50 MG CAPS Take 50 mg by mouth daily.    Historical Provider, MD  diphenhydrAMINE (BENADRYL) 25 mg capsule Take 25 mg by mouth every 6 (six) hours as needed.    Historical Provider, MD  furosemide (LASIX) 40 MG tablet Take 80 mg by mouth as needed. For fluid retention    Historical Provider, MD  ibuprofen (ADVIL,MOTRIN) 200 MG tablet Take 200 mg by mouth every 6 (six) hours as needed. For pain    Historical Provider, MD  Lysine 1000 MG TABS Take 1,000 mg by mouth daily.    Historical Provider, MD  magnesium oxide (MAG-OX) 400 MG tablet Take 400 mg by mouth daily.      Historical Provider, MD  meclizine (ANTIVERT) 25 MG tablet Take 25 mg by mouth daily as needed for dizziness.  10/14/08   Historical Provider, MD  Multiple Vitamin (MULTIVITAMIN) tablet Take 1 tablet by mouth daily.      Historical Provider, MD  niacin (NIASPAN) 500 MG CR tablet Take 500 mg by mouth at bedtime.    Historical Provider, MD  Potassium (GNP POTASSIUM) 99 MG TABS  10/14/08   Historical Provider, MD  pseudoephedrine (SUDAFED) 30 MG tablet Take 30 mg by mouth every 4 (four) hours as needed for congestion.    Historical Provider, MD  ranitidine (ZANTAC) 75 MG tablet Take 75 mg by mouth daily as needed for heartburn.    Historical Provider, MD  simethicone (MYLICON) 147 MG chewable tablet Chew 125 mg by mouth as needed for flatulence.    Historical Provider, MD  vitamin E 1000 UNIT capsule Take 1,000 Units by mouth daily.    Historical Provider, MD    Physical Exam: Vitals:   05/31/16 2130 05/31/16 2245 05/31/16 2300  05/31/16 2330  BP: (!) 155/102 (!) 175/85 (!) 151/103 (!) 150/114  Pulse: 81 81 67 84  Resp: (!) 21 15 17 16   Temp:      TempSrc:      SpO2: 98% 90% 94% 99%      Constitutional: Moderately built and nourished. Vitals:   05/31/16 2130 05/31/16 2245 05/31/16 2300 05/31/16 2330  BP: (!) 155/102 (!) 175/85 (!) 151/103 (!) 150/114  Pulse: 81 81 67 84  Resp: (!) 21 15 17 16   Temp:      TempSrc:      SpO2: 98% 90% 94% 99%   Eyes: Anicteric no pallor. ENMT: No discharge from the ears eyes nose and mouth. Neck: No mass felt. No neck rigidity. Respiratory: No rhonchi or crepitations. Cardiovascular: S1 and S2 heard no murmurs appreciated. Abdomen: Soft nontender bowel sounds present. No guarding or rigidity. Musculoskeletal: No edema. No joint effusion. Skin: No rash. Skin appears warm. Neurologic: Alert awake oriented to time place and person patient appears restless. Moves all extremities. Psychiatric: Patient is restless.   Labs on Admission: I have personally reviewed following labs and imaging studies  CBC:  Recent Labs Lab 05/31/16 1849 05/31/16 1905  WBC 9.6  --   HGB 16.9 17.0  HCT 50.0 50.0  MCV 84.2  --   PLT 131*  --    Basic Metabolic Panel:  Recent Labs Lab 05/31/16 1849 05/31/16 1905  NA 140 141  K 4.1 4.4  CL 105 107  CO2 21*  --   GLUCOSE 93 93  BUN 13 17  CREATININE 0.96 0.90  CALCIUM  10.5*  --    GFR: CrCl cannot be calculated (Unknown ideal weight.). Liver Function Tests:  Recent Labs Lab 05/31/16 2033  AST 41  ALT 22  ALKPHOS 77  BILITOT 1.4*  PROT 7.4  ALBUMIN 4.2   No results for input(s): LIPASE, AMYLASE in the last 168 hours.  Recent Labs Lab 05/31/16 2033  AMMONIA 25   Coagulation Profile:  Recent Labs Lab 05/31/16 1849  INR 1.10   Cardiac Enzymes: No results for input(s): CKTOTAL, CKMB, CKMBINDEX, TROPONINI in the last 168 hours. BNP (last 3 results) No results for input(s): PROBNP in the last 8760  hours. HbA1C: No results for input(s): HGBA1C in the last 72 hours. CBG: No results for input(s): GLUCAP in the last 168 hours. Lipid Profile: No results for input(s): CHOL, HDL, LDLCALC, TRIG, CHOLHDL, LDLDIRECT in the last 72 hours. Thyroid Function Tests: No results for input(s): TSH, T4TOTAL, FREET4, T3FREE, THYROIDAB in the last 72 hours. Anemia Panel: No results for input(s): VITAMINB12, FOLATE, FERRITIN, TIBC, IRON, RETICCTPCT in the last 72 hours. Urine analysis:    Component Value Date/Time   COLORURINE YELLOW 07/13/2011 [place] 07/13/2011 1406   LABSPEC 1.023 07/13/2011 1406   PHURINE 6.0 07/13/2011 1406   GLUCOSEU NEGATIVE 07/13/2011 1406   [place] 07/13/2011 1406   [place] 07/13/2011 1406   [place] 07/13/2011 1406   PROTEINUR NEGATIVE 07/13/2011 1406   UROBILINOGEN 0.2 07/13/2011 1406   NITRITE NEGATIVE 07/13/2011 1406   LEUKOCYTESUR NEGATIVE 07/13/2011 1406   Sepsis Labs: @LABRCNTIP (procalcitonin:4,lacticidven:4) )No results found for this or any previous visit (from the past 240 hour(s)).   Radiological Exams on Admission: Ct Head Wo Contrast  Result Date: 05/31/2016 CLINICAL DATA:  Headache. Neck and back pain. Chest pain. Confusion beginning this morning. EXAM: CT HEAD WITHOUT CONTRAST TECHNIQUE: Contiguous axial images were obtained from the base of the skull through the vertex without intravenous contrast. COMPARISON:  None. FINDINGS: Brain: Moderate generalized atrophy is stable. Prominence of the extra-axial spaces, asymmetric on the left, is also stable. No acute cortical infarct, hemorrhage, or mass lesion is present. The ventricles are proportionate to the degree of atrophy. The basal ganglia and insular ribbon are intact. The brainstem and cerebellum are unremarkable. Vascular: Atherosclerotic calcifications are present within the cavernous internal carotid arteries bilaterally. No focal hyperdense vessel is  present. There is moderate density of the vascularity diffusely compatible with a relatively hemo concentrated state. Skull: No focal lytic or blastic lesions are present. The calvarium is intact. Sinuses/Orbits: The paranasal sinuses are clear. Wall thickening in the maxillary sinuses bilaterally, right greater than left, suggests a history of chronic disease. There is no active disease. Minimal fluid is present in the inferior right mastoid air cells the mastoid air cells and middle ear cavities are otherwise clear. No obstructing lesion is present. IMPRESSION: 1. Moderate generalized atrophy with slight progression since the prior exam. 2. No acute intracranial abnormality. 3. Atherosclerosis. Electronically Signed   By: San Morelle M.D.   On: 05/31/2016 20:14   Dg Chest Portable 1 View  Result Date: 05/31/2016 CLINICAL DATA:  Colonoscopy yesterday. Today with shortness of breath, disoriented, weakness, nausea. EXAM: PORTABLE CHEST 1 VIEW COMPARISON:  Chest x-rays dated 03/02/2016 and 02/16/2012. FINDINGS: Heart size and mediastinal contours appear stable. There is new central pulmonary vascular congestion and bilateral pulmonary edema. Suspect underlying pulmonary artery hypertension as well. No pleural effusion or pneumothorax seen. No acute or suspicious osseous finding. Fixation rods appear grossly  stable in position within the thoracic spine. IMPRESSION: 1. New central pulmonary vascular congestion and bilateral pulmonary edema suggesting CHF/volume overload. 2.  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 Electronically Signed   By: Franki Cabot M.D.   On: 05/31/2016 18:28   Ct Angio Chest/abd/pel For Dissection W And/or Wo Contrast  Result Date: 05/31/2016 CLINICAL DATA:  Acute onset of headache, neck pain, back pain, chest pain, shortness of breath and confusion. Initial encounter. EXAM: CT ANGIOGRAPHY CHEST, ABDOMEN AND PELVIS TECHNIQUE: Multidetector CT imaging through the chest,  abdomen and pelvis was performed using the standard protocol during bolus administration of intravenous contrast. Multiplanar reconstructed images and MIPs were obtained and reviewed to evaluate the vascular anatomy. CONTRAST:  100 mL of Isovue 370 IV contrast COMPARISON:  Chest radiograph performed earlier today at 6:03 p.m., CTA of the abdomen and pelvis performed 01/08/2014, and CTA of the chest performed 07/28/2011 FINDINGS: CTA CHEST FINDINGS Cardiovascular: Scattered calcification is noted along the aortic arch and descending thoracic aorta. There is no evidence of aneurysmal dilatation. There is no evidence of aortic dissection. The great vessels are grossly unremarkable in appearance, aside from minimal calcification. There is no evidence of significant pulmonary embolus. Diffuse coronary artery calcifications are seen. Mediastinum/Nodes: An enlarged 2.1 cm right paratracheal node is seen. No additional mediastinal lymphadenopathy is appreciated. No pericardial effusion is identified. The thyroid gland is grossly unremarkable in appearance. No axillary lymphadenopathy is seen. Lungs/Pleura: Bilateral emphysematous change is noted. No focal consolidation, pleural effusion or pneumothorax is seen. No masses are identified. Musculoskeletal: No acute osseous abnormalities are seen. The visualized musculature is grossly unremarkable. Review of the MIP images confirms the above findings. CTA ABDOMEN AND PELVIS FINDINGS VASCULAR Aorta: An aortoiliac stent graft is fully patent. Scattered calcification is seen along the abdominal aorta and its branches. Celiac: Minimal calcification is noted at the origin of the celiac trunk. Diffuse calcification is seen along the splenic artery. SMA: The superior mesenteric artery remains patent, with minimal calcification at the origin of the superior mesenteric artery. Renals: Mild calcification is noted along the proximal left renal artery. The renal arteries are otherwise  unremarkable. IMA: The inferior mesenteric artery is not visualized. Inflow: Scattered calcification is seen along the common, internal and external iliac arteries bilaterally. Evaluation of the arterial system distal to the common femoral arteries is limited due to limitations in the timing of the contrast bolus. Scattered calcification is seen along the superficial femoral arteries bilaterally, along the popliteal arteries, and extending distally into both calves. The vasculature is not well assessed at the level of the calves. The profunda femoris arteries appear grossly intact bilaterally. Veins: Visualized venous structures are grossly unremarkable, though difficult to fully assess given the phase of contrast enhancement. Review of the MIP images confirms the above findings. NON-VASCULAR Hepatobiliary: The mildly nodular contour of the liver raises concern for hepatic cirrhosis. Clips are seen at the gallbladder fossa. The common bile duct is normal in caliber. Pancreas: The pancreas is within normal limits. Spleen: The spleen is unremarkable in appearance. Adrenals/Urinary Tract: There is nodularity about both adrenal glands, measuring up to 3.0 cm on the left. This appears relatively stable from 2013 and is likely benign. Numerous bilateral renal cysts are seen. Nonspecific perinephric stranding is noted bilaterally. A nonobstructing 5 mm stone is noted at the interpole region of the left kidney. No hydronephrosis is seen. No obstructing renal stones are identified. Stomach/Bowel: The stomach is unremarkable in appearance. The small bowel is within normal  limits. The appendix is not visualized; there is no evidence for appendicitis. Scattered diverticulosis is noted along the ascending colon. The colon is otherwise unremarkable. Lymphatic: No retroperitoneal or pelvic sidewall lymphadenopathy is seen. Reproductive: The bladder is mildly distended and grossly unremarkable. The prostate remains normal in size,  with minimal calcification. Other: The visualized portions of the lower extremities are grossly unremarkable in appearance. No knee joint effusions are seen. Vague soft tissue edema is noted tracking about both ankles. No focal fluid collections are seen. Musculoskeletal: No acute osseous abnormalities are identified. Thoracic spinal stimulation leads are noted. The patient is status post thoracolumbar spinal fusion. The visualized musculature is unremarkable in appearance. Review of the MIP images confirms the above findings. IMPRESSION: 1. Limited evaluation of arterial flow to the lower extremities due to limitations in the timing of the contrast bolus. The arterial system remains fully patent to at least the level of the common femoral arteries bilaterally. 2. Aortoiliac stent graft is fully patent. Scattered aortic atherosclerosis noted. 3. No evidence of aortic dissection. No evidence of aneurysmal dilatation. 4. No evidence of significant pulmonary embolus. 5. Vague soft tissue edema noted tracking about both ankles. Lower extremities are otherwise grossly unremarkable, though the toes are not imaged on this study, and the vasculature of the lower extremities is not well assessed, aside from scattered calcific atherosclerotic disease. 6. Enlarged 2.1 cm right paratracheal node noted, of uncertain significance. This is somewhat more prominent than in 2013. 7. Diffuse coronary artery calcifications seen. 8. Bilateral emphysema noted. 9. Mild changes of hepatic cirrhosis. 10. Nodularity about both adrenal glands appears relatively stable from 2013 and is likely benign. 11. Numerous bilateral renal cysts. Nonobstructing 5 mm stone at the interpole region of the left kidney. 12. Scattered diverticulosis along the ascending colon, without evidence of diverticulitis. Electronically Signed   By: Garald Balding M.D.   On: 05/31/2016 21:17    EKG: Independently reviewed. Normal sinus rhythm. RSR pattern in  V1.  Assessment/Plan Principal Problem:   Acute encephalopathy Active Problems:   Chronic low back pain   Abdominal aortic aneurysm (HCC)   Essential hypertension    1. Acute encephalopathy/delirium - cause not clear. Will obtained blood cultures since patient was diaphoretic. Cycle cardiac markers. Continue patient's home pain medications and antidepressants. I have ordered salicylate levels. MRI of the brain urine drug screen and UA are pending. 2. Hypertension on Accupril and amlodipine. 3. Chronic low back pain on Duragesic patch. 4. History of depression continue home medications. 5. Possible history of CHF on when necessary Lasix. Since patient has lower extremity edema 1 dose of Lasix was given. Closely observe. 6. History of abdominal artery aneurysm status post repair - CT abdomen does not show anything. 7. Enlarged paratracheal lymph node will need follow-up as outpatient.   DVT prophylaxis: SCDs. Code Status: Full code.  Family Communication: No family at the bedside.  Disposition Plan: Home.  Consults called: None.  Admission status: Observation.    Rise Patience MD [facility] Pager [phone].  If 7PM-7AM, please contact night-coverage [email or web address] Password [facility]  06/01/2016, 12:39 AM

## 2016-06-01 NOTE — Care Management Obs Status (Signed)
[place] NOTIFICATION   Patient Details  Name: Michael Lutz MRN: [id number] Date of Birth: [birth year]   Medicare Observation Status Notification Given:  Yes    Maryclare Labrador, RN 06/01/2016, 10:45 AM

## 2016-06-01 NOTE — Evaluation (Signed)
Physical Therapy Evaluation Patient Details Name: Michael Lutz MRN: [id number] DOB: 1945-11-04 Today's Date: 06/01/2016   History of Present Illness  Pt adm with AMS. Pt on multiple sedating home meds. PMH - multiple back surgeries and chronic back pain.  PMH -HTN, depression, AAA repair, arthritis, bipolar, chf  Clinical Impression  Pt admitted with above diagnosis and presents to PT with functional limitations due to deficits listed below (See PT problem list). Pt needs skilled PT to maximize independence and safety to allow discharge to home if pt's mentation improves. May need to use a walker instead of his cane to improve safety. Will see again tomorrow to continue to assess appropriateness to return home. If pt mentation doesn't improve and he remains unsteady he may need ST-SNF.     Follow Up Recommendations Home health PT (If mentation clears)    Equipment Recommendations  None recommended by PT    Recommendations for Other Services       Precautions / Restrictions Precautions Precautions: Fall Restrictions Weight Bearing Restrictions: No      Mobility  Bed Mobility Overal bed mobility: Modified Independent             General bed mobility comments: Incr time and effort  Transfers Overall transfer level: Needs assistance Equipment used: Quad cane Transfers: Sit to/from Stand Sit to Stand: Min assist         General transfer comment: Assist for balance  Ambulation/Gait Ambulation/Gait assistance: Min assist Ambulation Distance (Feet): 120 Feet Assistive device: Quad cane;1 person hand held assist Gait Pattern/deviations: Step-through pattern;Decreased stride length;Trunk flexed;Wide base of support Gait velocity: decr Gait velocity interpretation: Below normal speed for age/gender General Gait Details: Assist for balance. Pt requiring cane on one side and hand held on other side.  Stairs            Wheelchair Mobility    Modified Rankin  (Stroke Patients Only)       Balance Overall balance assessment: Needs assistance Sitting-balance support: No upper extremity supported;Feet supported Sitting balance-Leahy Scale: Good     Standing balance support: No upper extremity supported Standing balance-Leahy Scale: Fair                               Pertinent Vitals/Pain Pain Assessment: Faces Faces Pain Scale: Hurts a little bit Pain Location: generalized Pain Descriptors / Indicators: Grimacing Pain Intervention(s): Limited activity within patient's tolerance;Monitored during session    Home Living Family/patient expects to be discharged to:: Private residence Living Arrangements: Alone Available Help at Discharge: Friend(s);Available PRN/intermittently Type of Home: House Home Access: Ramped entrance     Home Layout: One level Home Equipment: Latina Craver - 2 wheels      Prior Function Level of Independence: Independent with assistive device(s)         Comments: Amb with quad cane     Hand Dominance   Dominant Hand: Left    Extremity/Trunk Assessment   Upper Extremity Assessment Upper Extremity Assessment: Defer to OT evaluation    Lower Extremity Assessment Lower Extremity Assessment: Generalized weakness       Communication   Communication: No difficulties  Cognition Arousal/Alertness: Awake/alert Behavior During Therapy: Flat affect Overall Cognitive Status: Impaired/Different from baseline Area of Impairment: Following commands;Problem solving;Safety/judgement       Following Commands: Follows one step commands with increased time Safety/Judgement: Decreased awareness of safety   Problem Solving: Slow processing;Decreased initiation  General Comments      Exercises     Assessment/Plan    PT Assessment Patient needs continued PT services  PT Problem List Decreased strength;Decreased activity tolerance;Decreased balance;Decreased mobility;Decreased  safety awareness       PT Treatment Interventions DME instruction;Gait training;Functional mobility training;Therapeutic activities;Therapeutic exercise;Balance training;Patient/family education    PT Goals (Current goals can be found in the Care Plan section)  Acute Rehab PT Goals Patient Stated Goal: not stated PT Goal Formulation: With patient Time For Goal Achievement: 06/08/16 Potential to Achieve Goals: Good    Frequency Min 3X/week   Barriers to discharge Decreased caregiver support lives alone    Co-evaluation               End of Session Equipment Utilized During Treatment: Gait belt Activity Tolerance: Patient tolerated treatment well Patient left: in chair;with call bell/phone within reach;with chair alarm set Nurse Communication: Mobility status PT Visit Diagnosis: Unsteadiness on feet (R26.81);Muscle weakness (generalized) (M62.81)    Functional Assessment Tool Used: AM-PAC 6 Clicks Basic Mobility Functional Limitation: Mobility: Walking and moving around Mobility: Walking and Moving Around Current Status (A2130): At least 40 percent but less than 60 percent impaired, limited or restricted Mobility: Walking and Moving Around Goal Status [phone]): At least 20 percent but less than 40 percent impaired, limited or restricted    Time: 1237-1257 PT Time Calculation (min) (ACUTE ONLY): 20 min   Charges:   PT Evaluation $PT Eval Moderate Complexity: 1 Procedure     PT G Codes:   PT G-Codes **NOT FOR INPATIENT CLASS** Functional Assessment Tool Used: AM-PAC 6 Clicks Basic Mobility Functional Limitation: Mobility: Walking and moving around Mobility: Walking and Moving Around Current Status (I6962): At least 40 percent but less than 60 percent impaired, limited or restricted Mobility: Walking and Moving Around Goal Status [phone]): At least 20 percent but less than 40 percent impaired, limited or restricted     [place] 06/01/2016, 4:02 PM [place]

## 2016-06-01 NOTE — Progress Notes (Signed)
   06/01/16 1347  Clinical Encounter Type  Visited With Patient  Visit Type Initial  Referral From Nurse  Consult/Referral To Chaplain  Spiritual Encounters  Spiritual Needs Literature;Brochure  Stress Factors  Patient Stress Factors None identified  Family Stress Factors None identified  Advance Directives (For Healthcare)  Does Patient Have a Medical Advance Directive? No  Would patient like information on creating a medical advance directive? No - Patient declined  Pt mentioned not ready to fill out AD, but considered it before.  Will contact [place] is wants to complete.  Chaplain Rebbie Lauricella A.Michael Lutz [phone]

## 2016-06-02 DIAGNOSIS — I714 Abdominal aortic aneurysm, without rupture: Secondary | ICD-10-CM

## 2016-06-02 LAB — BASIC METABOLIC PANEL
Anion gap: 11 (ref 5–15)
BUN: 22 mg/dL — ABNORMAL HIGH (ref 6–20)
CO2: 23 mmol/L (ref 22–32)
Calcium: 9.1 mg/dL (ref 8.9–10.3)
Chloride: 104 mmol/L (ref 101–111)
Creatinine, Ser: 1.07 mg/dL (ref 0.61–1.24)
GFR calc Af Amer: >60 mL/min (ref >60)
GFR calc non Af Amer: >60 mL/min (ref >60)
Glucose, Bld: 83 mg/dL (ref 65–99)
Potassium: 3.1 mmol/L — ABNORMAL LOW (ref 3.5–5.1)
Sodium: 138 mmol/L (ref 135–145)

## 2016-06-02 LAB — CBC
HCT: 46.2 % (ref 39.0–52.0)
Hemoglobin: 15.5 g/dL (ref 13.0–17.0)
MCH: 28.5 pg (ref 26.0–34.0)
MCHC: 33.5 g/dL (ref 30.0–36.0)
MCV: 85.1 fL (ref 78.0–100.0)
Platelets: 114 10*3/uL — ABNORMAL LOW (ref 150–400)
RBC: 5.43 MIL/uL (ref 4.22–5.81)
RDW: 15.7 % — ABNORMAL HIGH (ref 11.5–15.5)
WBC: 8.7 10*3/uL (ref 4.0–10.5)

## 2016-06-02 MED ORDER — POTASSIUM CHLORIDE CRYS ER 20 MEQ PO TBCR
40.0000 meq | EXTENDED_RELEASE_TABLET | Freq: Once | ORAL | Status: AC
  Administered 2016-06-02: 40 meq via ORAL
  Filled 2016-06-02: qty 2

## 2016-06-02 NOTE — Progress Notes (Signed)
NURSING PROGRESS NOTE  Michael Lutz [id number] Transfer Data: 06/02/2016 4:16 PM Attending Provider: Domenic Polite, MD [id number] Jeani Hawking, MD Code Status: full  Michael Lutz is a 71 y.o. male patient transferred from [place]  -No acute distress noted.  -No complaints of shortness of breath.  -No complaints of chest pain.   Cardiac Monitoring: Box # 18 in place. Cardiac monitor yields:NSR .  Blood pressure (!) 148/96, pulse 64, temperature 98.9 F (37.2 C), temperature source Oral, resp. rate 15, height 5\' 11"  (1.803 m), weight 109.9 kg (242 lb 4.8 oz), SpO2 100 %.   IV Fluids:  IV in place, occlusive dsg intact left upper arm & saline locked  Allergies:  Patient has no known allergies.  Past Medical History:   has a past medical history of AAA (abdominal aortic aneurysm) ([place]); ADD (attention deficit disorder); Anxiety; Arthritis; Bipolar 1 disorder ([place]); Bipolar affective disorder ([place]); Blood transfusion; CHF (congestive heart failure) ([place]); Chronic back pain; Chronic back pain; Chronic low back pain (07/31/2013); Colon polyps; Constipation; Depression; Dysrhythmia; Enlarged prostate; Full dentures; GERD (gastroesophageal reflux disease); Heart murmur; History of shingles; scabies (Mid Feb 2013); seasonal allergies; Hyperlipidemia; Hypertension; Insomnia; Joint pain; Leg pain (05-04-10); Memory loss; Nocturia; Peripheral edema; Peripheral vascular disease ([place]); Urinary urgency; and Wears glasses.  Past Surgical History:   has a past surgical history that includes Spine surgery (12/2009); Abdominal aortic aneurysm repair (11/2009); Foot surgery; Shoulder surgery; Tonsillectomy; Testicle surgery; TURP vaporization; Cholecystectomy; Back surgery; Spinal cord stimulator implant (2009); Vascular surgery; Cardiac catheterization; Spinal fusion (04/04/2011); Colonoscopy; and Ganglion cyst excision (Right, 06/07/2012).  Social History:   reports that he quit smoking about 2 years ago. His  smoking use included Cigars. He has a 25.00 pack-year smoking history. He has never used smokeless tobacco. He reports that he does not drink alcohol or use drugs.  Skin: intact, with bruising around IV site on LUE  Patient/Family orientated to room. Information packet given to patient/family. Admission inpatient armband information verified with patient/family to include name and date of birth and placed on patient arm. Side rails up x 2, fall assessment and education completed with patient/family. Patient/family able to verbalize understanding of risk associated with falls and verbalized understanding to call for assistance before getting out of bed. Call light within reach. Patient/family able to voice and demonstrate understanding of unit orientation instructions.    Will continue to evaluate and treat per MD orders.

## 2016-06-02 NOTE — Progress Notes (Signed)
PROGRESS NOTE    Michael Lutz  KGU:[id number] DOB: 09-03-1945 DOA: 05/31/2016 PCP: Tawanna Solo, MD Brief Narrative: [name] admitted with drowsiness/confusion, he had a Colonoscopy day prior to admission. CT abd unremarkable, :Labs, urine, ETOH level, CT head all unremarkable. Encephalopathy was related to Polypharmacy, pt is on an alarmingly high doses of 10 or more sedating meds including  Assessment & Plan: 1.  Acute encephalopathy -CT abd unremarkable, Labs, urine, ETOH level, CT head all unremarkable -suspect this is related to Polypharmacy, pt is on an alarmingly high doses of 10 or more sedating meds including Fentanyl 149mcg, Lyrica 223mg  TID(above max dose of 450mg ), Trazodone 400mg  daily, Seroquel 250mg  QD, Effexor 225mg  QD, Robaxin 750mg  TID, Meclizine 25mg  , Doxepin 50mg  QHS, Benadryl 25mg  QID PRN, Klonopin 0.25mg  BID PRN -I have cut down doses of Lyrica, Trazodone, Seroquel, Effexor, RObaxin, Doxepin  -Blood Cx negative -ambulate with PT -transfer out of SDU  2. Hypertension -stable, on Accupril and amlodipine.  3. Chronic low back pain  -on Duragesic patch, followed by pain clinic  4. History of depression  -cut down home meds as above.  5. History of abdominal artery aneurysm status post repair  - CT abdomen benign  6. Enlarged paratracheal lymph node -needs follow-up as outpatient.  DVT prophylaxis: SCDs-due to low platelets Code Status: Full code.  Family Communication: No family at the bedside.  Disposition Plan: Home tomorrow if stable   Consultants:    Subjective: Doing better, more awake today  Objective: Vitals:   06/02/16 0800 06/02/16 0900 06/02/16 1000 06/02/16 1100  BP: (!) 135/99   (!) 145/84  Pulse: 64     Resp: 16 13 16 18   Temp:    98.9 F (37.2 C)  TempSrc:    Oral  SpO2: 96%     Weight:      Height:        Intake/Output Summary (Last 24 hours) at 06/02/16 1323 Last data filed at 06/02/16 1000  Gross per 24 hour    Intake              240 ml  Output              600 ml  Net             -360 ml   Filed Weights   06/01/16 0200  Weight: 109.9 kg (242 lb 4.8 oz)    Examination:  General exam: AAOx3, no distress Respiratory system: Clear to auscultation. Respiratory effort normal. Cardiovascular system: S1 & S2 heard, RRR. No JVD, murmurs, rubs, gallops or clicks Gastrointestinal system: Abdomen is nondistended, soft and nontender. Normal bowel sounds heard. Central nervous system: Alert and oriented. No focal neurological deficits. Extremities: Symmetric 5 x 5 power. Skin: No rashes, lesions or ulcers Psychiatry: Judgement and insight appear normal. Mood & affect appropriate.     Data Reviewed: I have personally reviewed following labs and imaging studies  CBC:  Recent Labs Lab 05/31/16 1849 05/31/16 1905 06/01/16 0110 06/02/16 0153  WBC 9.6  --  10.1 8.7  HGB 16.9 17.0 17.4* 15.5  HCT 50.0 50.0 50.1 46.2  MCV 84.2  --  84.1 85.1  PLT 131*  --  130* 628*   Basic Metabolic Panel:  Recent Labs Lab 05/31/16 1849 05/31/16 1905 06/01/16 0110 06/02/16 0153  NA 140 141 140 138  K 4.1 4.4 3.7 3.1*  CL 105 107 106 104  CO2 21*  --  17* 23  GLUCOSE 93 93  94 83  BUN 13 17 15  22*  CREATININE 0.96 0.90 1.01 1.07  CALCIUM 10.5*  --  10.2 9.1   GFR: Estimated Creatinine Clearance: 81 mL/min (by C-G formula based on SCr of 1.07 mg/dL). Liver Function Tests:  Recent Labs Lab 05/31/16 2033 06/01/16 0110  AST 41 46*  ALT 22 23  ALKPHOS 77 74  BILITOT 1.4* 1.4*  PROT 7.4 7.4  ALBUMIN 4.2 4.1    Recent Labs Lab 06/01/16 1027  LIPASE 13    Recent Labs Lab 05/31/16 2033  AMMONIA 25   Coagulation Profile:  Recent Labs Lab 05/31/16 1849  INR 1.10   Cardiac Enzymes: No results for input(s): CKTOTAL, CKMB, CKMBINDEX, TROPONINI in the last 168 hours. BNP (last 3 results) No results for input(s): PROBNP in the last 8760 hours. HbA1C: No results for input(s): HGBA1C  in the last 72 hours. CBG: No results for input(s): GLUCAP in the last 168 hours. Lipid Profile: No results for input(s): CHOL, HDL, LDLCALC, TRIG, CHOLHDL, LDLDIRECT in the last 72 hours. Thyroid Function Tests: No results for input(s): TSH, T4TOTAL, FREET4, T3FREE, THYROIDAB in the last 72 hours. Anemia Panel: No results for input(s): VITAMINB12, FOLATE, FERRITIN, TIBC, IRON, RETICCTPCT in the last 72 hours. Urine analysis:    Component Value Date/Time   COLORURINE STRAW (A) 06/01/2016 0119   APPEARANCEUR CLEAR 06/01/2016 0119   LABSPEC 1.012 06/01/2016 0119   PHURINE 6.0 06/01/2016 0119   GLUCOSEU NEGATIVE 06/01/2016 0119   HGBUR NEGATIVE 06/01/2016 0119   BILIRUBINUR NEGATIVE 06/01/2016 0119   KETONESUR NEGATIVE 06/01/2016 0119   PROTEINUR NEGATIVE 06/01/2016 0119   UROBILINOGEN 0.2 07/13/2011 1406   NITRITE NEGATIVE 06/01/2016 0119   LEUKOCYTESUR NEGATIVE 06/01/2016 0119   Sepsis Labs: @LABRCNTIP (procalcitonin:4,lacticidven:4)  ) Recent Results (from the past 240 hour(s))  Blood culture (routine x 2)     Status: None (Preliminary result)   Collection Time: 05/31/16  8:50 PM  Result Value Ref Range Status   Specimen Description BLOOD RIGHT HAND  Final   Special Requests IN PEDIATRIC BOTTLE 2CC  Final   Culture NO GROWTH < 12 HOURS  Final   Report Status PENDING  Incomplete  Blood culture (routine x 2)     Status: None (Preliminary result)   Collection Time: 05/31/16  9:02 PM  Result Value Ref Range Status   Specimen Description BLOOD LEFT HAND  Final   Special Requests IN PEDIATRIC BOTTLE 2CC  Final   Culture NO GROWTH < 12 HOURS  Final   Report Status PENDING  Incomplete  MRSA PCR Screening     Status: None   Collection Time: 06/01/16  2:05 AM  Result Value Ref Range Status   MRSA by PCR NEGATIVE NEGATIVE Final    Comment:        The GeneXpert MRSA Assay (FDA approved for NASAL specimens only), is one component of a comprehensive MRSA  colonization surveillance program. It is not intended to diagnose MRSA infection nor to guide or monitor treatment for MRSA infections.          Radiology Studies: Ct Head Wo Contrast  Result Date: 05/31/2016 CLINICAL DATA:  Headache. Neck and back pain. Chest pain. Confusion beginning this morning. EXAM: CT HEAD WITHOUT CONTRAST TECHNIQUE: Contiguous axial images were obtained from the base of the skull through the vertex without intravenous contrast. COMPARISON:  None. FINDINGS: Brain: Moderate generalized atrophy is stable. Prominence of the extra-axial spaces, asymmetric on the left, is also stable. No acute cortical infarct,  hemorrhage, or mass lesion is present. The ventricles are proportionate to the degree of atrophy. The basal ganglia and insular ribbon are intact. The brainstem and cerebellum are unremarkable. Vascular: Atherosclerotic calcifications are present within the cavernous internal carotid arteries bilaterally. No focal hyperdense vessel is present. There is moderate density of the vascularity diffusely compatible with a relatively hemo concentrated state. Skull: No focal lytic or blastic lesions are present. The calvarium is intact. Sinuses/Orbits: The paranasal sinuses are clear. Wall thickening in the maxillary sinuses bilaterally, right greater than left, suggests a history of chronic disease. There is no active disease. Minimal fluid is present in the inferior right mastoid air cells the mastoid air cells and middle ear cavities are otherwise clear. No obstructing lesion is present. IMPRESSION: 1. Moderate generalized atrophy with slight progression since the prior exam. 2. No acute intracranial abnormality. 3. Atherosclerosis. Electronically Signed   By: San Morelle M.D.   On: 05/31/2016 20:14   Dg Chest Portable 1 View  Result Date: 05/31/2016 CLINICAL DATA:  Colonoscopy yesterday. Today with shortness of breath, disoriented, weakness, nausea. EXAM: PORTABLE  CHEST 1 VIEW COMPARISON:  Chest x-rays dated 03/02/2016 and 02/16/2012. FINDINGS: Heart size and mediastinal contours appear stable. There is new central pulmonary vascular congestion and bilateral pulmonary edema. Suspect underlying pulmonary artery hypertension as well. No pleural effusion or pneumothorax seen. No acute or suspicious osseous finding. Fixation rods appear grossly stable in position within the thoracic spine. IMPRESSION: 1. New central pulmonary vascular congestion and bilateral pulmonary edema suggesting CHF/volume overload. 2.  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 Electronically Signed   By: Franki Cabot M.D.   On: 05/31/2016 18:28   Ct Angio Chest/abd/pel For Dissection W And/or Wo Contrast  Result Date: 05/31/2016 CLINICAL DATA:  Acute onset of headache, neck pain, back pain, chest pain, shortness of breath and confusion. Initial encounter. EXAM: CT ANGIOGRAPHY CHEST, ABDOMEN AND PELVIS TECHNIQUE: Multidetector CT imaging through the chest, abdomen and pelvis was performed using the standard protocol during bolus administration of intravenous contrast. Multiplanar reconstructed images and MIPs were obtained and reviewed to evaluate the vascular anatomy. CONTRAST:  100 mL of Isovue 370 IV contrast COMPARISON:  Chest radiograph performed earlier today at 6:03 p.m., CTA of the abdomen and pelvis performed 01/08/2014, and CTA of the chest performed 07/28/2011 FINDINGS: CTA CHEST FINDINGS Cardiovascular: Scattered calcification is noted along the aortic arch and descending thoracic aorta. There is no evidence of aneurysmal dilatation. There is no evidence of aortic dissection. The great vessels are grossly unremarkable in appearance, aside from minimal calcification. There is no evidence of significant pulmonary embolus. Diffuse coronary artery calcifications are seen. Mediastinum/Nodes: An enlarged 2.1 cm right paratracheal node is seen. No additional mediastinal lymphadenopathy is  appreciated. No pericardial effusion is identified. The thyroid gland is grossly unremarkable in appearance. No axillary lymphadenopathy is seen. Lungs/Pleura: Bilateral emphysematous change is noted. No focal consolidation, pleural effusion or pneumothorax is seen. No masses are identified. Musculoskeletal: No acute osseous abnormalities are seen. The visualized musculature is grossly unremarkable. Review of the MIP images confirms the above findings. CTA ABDOMEN AND PELVIS FINDINGS VASCULAR Aorta: An aortoiliac stent graft is fully patent. Scattered calcification is seen along the abdominal aorta and its branches. Celiac: Minimal calcification is noted at the origin of the celiac trunk. Diffuse calcification is seen along the splenic artery. SMA: The superior mesenteric artery remains patent, with minimal calcification at the origin of the superior mesenteric artery. Renals: Mild calcification is noted along the  proximal left renal artery. The renal arteries are otherwise unremarkable. IMA: The inferior mesenteric artery is not visualized. Inflow: Scattered calcification is seen along the common, internal and external iliac arteries bilaterally. Evaluation of the arterial system distal to the common femoral arteries is limited due to limitations in the timing of the contrast bolus. Scattered calcification is seen along the superficial femoral arteries bilaterally, along the popliteal arteries, and extending distally into both calves. The vasculature is not well assessed at the level of the calves. The profunda femoris arteries appear grossly intact bilaterally. Veins: Visualized venous structures are grossly unremarkable, though difficult to fully assess given the phase of contrast enhancement. Review of the MIP images confirms the above findings. NON-VASCULAR Hepatobiliary: The mildly nodular contour of the liver raises concern for hepatic cirrhosis. Clips are seen at the gallbladder fossa. The common bile duct  is normal in caliber. Pancreas: The pancreas is within normal limits. Spleen: The spleen is unremarkable in appearance. Adrenals/Urinary Tract: There is nodularity about both adrenal glands, measuring up to 3.0 cm on the left. This appears relatively stable from 2013 and is likely benign. Numerous bilateral renal cysts are seen. Nonspecific perinephric stranding is noted bilaterally. A nonobstructing 5 mm stone is noted at the interpole region of the left kidney. No hydronephrosis is seen. No obstructing renal stones are identified. Stomach/Bowel: The stomach is unremarkable in appearance. The small bowel is within normal limits. The appendix is not visualized; there is no evidence for appendicitis. Scattered diverticulosis is noted along the ascending colon. The colon is otherwise unremarkable. Lymphatic: No retroperitoneal or pelvic sidewall lymphadenopathy is seen. Reproductive: The bladder is mildly distended and grossly unremarkable. The prostate remains normal in size, with minimal calcification. Other: The visualized portions of the lower extremities are grossly unremarkable in appearance. No knee joint effusions are seen. Vague soft tissue edema is noted tracking about both ankles. No focal fluid collections are seen. Musculoskeletal: No acute osseous abnormalities are identified. Thoracic spinal stimulation leads are noted. The patient is status post thoracolumbar spinal fusion. The visualized musculature is unremarkable in appearance. Review of the MIP images confirms the above findings. IMPRESSION: 1. Limited evaluation of arterial flow to the lower extremities due to limitations in the timing of the contrast bolus. The arterial system remains fully patent to at least the level of the common femoral arteries bilaterally. 2. Aortoiliac stent graft is fully patent. Scattered aortic atherosclerosis noted. 3. No evidence of aortic dissection. No evidence of aneurysmal dilatation. 4. No evidence of significant  pulmonary embolus. 5. Vague soft tissue edema noted tracking about both ankles. Lower extremities are otherwise grossly unremarkable, though the toes are not imaged on this study, and the vasculature of the lower extremities is not well assessed, aside from scattered calcific atherosclerotic disease. 6. Enlarged 2.1 cm right paratracheal node noted, of uncertain significance. This is somewhat more prominent than in 2013. 7. Diffuse coronary artery calcifications seen. 8. Bilateral emphysema noted. 9. Mild changes of hepatic cirrhosis. 10. Nodularity about both adrenal glands appears relatively stable from 2013 and is likely benign. 11. Numerous bilateral renal cysts. Nonobstructing 5 mm stone at the interpole region of the left kidney. 12. Scattered diverticulosis along the ascending colon, without evidence of diverticulitis. Electronically Signed   By: Garald Balding M.D.   On: 05/31/2016 21:17        Scheduled Meds: . amLODipine  10 mg Oral Daily  . aspirin  162 mg Oral Daily  . atorvastatin  80 mg Oral  q1800  . calcium carbonate  1,250 mg Oral Q breakfast  . cycloSPORINE  1 drop Both Eyes BID  . doxepin  25 mg Oral QHS  . famotidine  20 mg Oral BID  . fentaNYL  100 mcg Transdermal Q48H  . lisinopril  40 mg Oral BID  . magnesium oxide  400 mg Oral Daily  . multivitamin with minerals  1 tablet Oral Daily  . niacin  500 mg Oral QHS  . pregabalin  100 mg Oral BID  . QUEtiapine  100 mg Oral QHS  . traZODone  100 mg Oral QHS  . venlafaxine XR  75 mg Oral Q breakfast  . vitamin C  500 mg Oral Daily  . vitamin E  1,000 Units Oral Daily   Continuous Infusions:   LOS: 1 day    Time spent: 63min    Domenic Polite, MD [facility] Pager [phone]  If 7PM-7AM, please contact night-coverage [email or web address] Password [email or web address] 06/02/2016, 1:23 PM

## 2016-06-02 NOTE — Significant Event (Signed)
Patient is being transfer to [phone], taken via wheelchair by unit's nurse tech [place]. VS stable prior to being transfer. Clothes, a cane, eye-glasses, and a pair of slippers are taken to new room. Patient's friend [place]) is called and updated at patient's requests. Report given to receiving RN.    Salma Walrond

## 2016-06-02 NOTE — Progress Notes (Signed)
qPhysical Therapy Treatment Patient Details Name: Michael Lutz MRN: [id number] DOB: Oct 11, 1945 Today's Date: 06/02/2016    History of Present Illness Pt adm with AMS. Pt on multiple sedating home meds. PMH - multiple back surgeries and chronic back pain.  PMH -HTN, depression, AAA repair, arthritis, bipolar, chf    PT Comments    Pt with much improved mentation. Expect pt will be close to his baseline for mobility at DC.   Follow Up Recommendations  Home health PT     Equipment Recommendations  None recommended by PT    Recommendations for Other Services       Precautions / Restrictions Precautions Precautions: Fall Restrictions Weight Bearing Restrictions: No    Mobility  Bed Mobility               General bed mobility comments: Pt sitting EOB  Transfers Overall transfer level: Needs assistance Equipment used: Quad cane Transfers: Sit to/from Stand Sit to Stand: Supervision         General transfer comment: Incr time and effort  Ambulation/Gait Ambulation/Gait assistance: Min guard;Supervision Ambulation Distance (Feet): 150 Feet Assistive device: Quad cane Gait Pattern/deviations: Step-through pattern;Trunk flexed;Wide base of support;Decreased step length - right;Decreased step length - left Gait velocity: decr Gait velocity interpretation: Below normal speed for age/gender General Gait Details: Assist for safety. Slightly unsteady but no loss of balance. Pt declined use of walker feeling more comfortable with cane which he usually uses at home   Stairs            Wheelchair Mobility    Modified Rankin (Stroke Patients Only)       Balance Overall balance assessment: Needs assistance Sitting-balance support: No upper extremity supported;Feet supported Sitting balance-Leahy Scale: Good     Standing balance support: No upper extremity supported Standing balance-Leahy Scale: Fair                              Cognition  Arousal/Alertness: Awake/alert Behavior During Therapy: WFL for tasks assessed/performed Overall Cognitive Status: Within Functional Limits for tasks assessed                                        Exercises      General Comments        Pertinent Vitals/Pain Pain Assessment: Faces Faces Pain Scale: Hurts a little bit Pain Location: generalized Pain Descriptors / Indicators: Grimacing Pain Intervention(s): Limited activity within patient's tolerance    Home Living                      Prior Function            PT Goals (current goals can now be found in the care plan section) Progress towards PT goals: Progressing toward goals    Frequency    Min 3X/week      PT Plan Current plan remains appropriate    Co-evaluation             End of Session   Activity Tolerance: Patient tolerated treatment well Patient left: in chair;with call bell/phone within reach (in w/c for transfer to other unit) Nurse Communication: Mobility status PT Visit Diagnosis: Unsteadiness on feet (R26.81);Muscle weakness (generalized) (M62.81)     Time: 8250-5397 PT Time Calculation (min) (ACUTE ONLY): 17 min  Charges:  $Gait Training: 8-22  mins                    G Codes:       [facility] PT [place] 06/02/2016, 3:01 PM

## 2016-06-03 DIAGNOSIS — I509 Heart failure, unspecified: Secondary | ICD-10-CM

## 2016-06-03 MED ORDER — PROMETHAZINE HCL 25 MG/ML IJ SOLN
12.5000 mg | Freq: Once | INTRAMUSCULAR | Status: AC
  Administered 2016-06-03: 12.5 mg via INTRAVENOUS
  Filled 2016-06-03: qty 1

## 2016-06-03 MED ORDER — CALCIUM CARBONATE ANTACID 500 MG PO CHEW
1.0000 | CHEWABLE_TABLET | Freq: Three times a day (TID) | ORAL | Status: DC | PRN
  Administered 2016-06-03 – 2016-06-04 (×2): 200 mg via ORAL
  Filled 2016-06-03 (×2): qty 1

## 2016-06-03 MED ORDER — BISACODYL 10 MG RE SUPP
10.0000 mg | Freq: Once | RECTAL | Status: AC
  Administered 2016-06-03: 10 mg via RECTAL
  Filled 2016-06-03: qty 1

## 2016-06-03 MED ORDER — POLYETHYLENE GLYCOL 3350 17 G PO PACK
17.0000 g | PACK | Freq: Every day | ORAL | Status: DC
  Administered 2016-06-03 – 2016-06-05 (×3): 17 g via ORAL
  Filled 2016-06-03 (×3): qty 1

## 2016-06-03 MED ORDER — LISINOPRIL 20 MG PO TABS
20.0000 mg | ORAL_TABLET | Freq: Every day | ORAL | Status: DC
  Administered 2016-06-04 – 2016-06-05 (×2): 20 mg via ORAL
  Filled 2016-06-03 (×2): qty 1

## 2016-06-03 MED ORDER — PROMETHAZINE HCL 25 MG/ML IJ SOLN
12.5000 mg | Freq: Four times a day (QID) | INTRAMUSCULAR | Status: DC | PRN
  Administered 2016-06-03 – 2016-06-05 (×6): 12.5 mg via INTRAVENOUS
  Filled 2016-06-03 (×7): qty 1

## 2016-06-03 MED ORDER — ONDANSETRON HCL 4 MG/2ML IJ SOLN
4.0000 mg | Freq: Four times a day (QID) | INTRAMUSCULAR | Status: DC | PRN
Start: 2016-06-03 — End: 2016-06-05
  Administered 2016-06-03 – 2016-06-05 (×4): 4 mg via INTRAVENOUS
  Filled 2016-06-03 (×4): qty 2

## 2016-06-03 NOTE — Progress Notes (Addendum)
PROGRESS NOTE    Michael Lutz  RJJ:[id number] DOB: Jul 13, 1945 DOA: 05/31/2016 PCP: Tawanna Solo, MD Brief Narrative: [name] admitted with drowsiness/confusion, he had a Colonoscopy day prior to admission. CT abd unremarkable, :Labs, urine, ETOH level, CT head all unremarkable. Encephalopathy was related to Polypharmacy, pt is on an alarmingly high doses of 10 or more sedating meds including  Assessment & Plan: 1.  Acute encephalopathy -resolved -CT abd unremarkable, Labs, urine, ETOH level, CT head all unremarkable -suspect this is related to Polypharmacy, pt is on an alarmingly high doses of 10 or more sedating meds including Fentanyl 161mcg, Lyrica 223mg  TID(above max dose of 450mg ), Trazodone 400mg  daily, Seroquel 250mg  QD, Effexor 225mg  QD, Robaxin 750mg  TID, Meclizine 25mg  , Doxepin 50mg  QHS, Benadryl 25mg  QID PRN, Klonopin 0.25mg  BID PRN -I have cut down doses of Lyrica, Trazodone, Seroquel, Effexor, RObaxin, Doxepin  -Blood Cx negative -ambulate with PT  2. Nausea/Vomiting -suspect due to mild withdrawal, give all am meds -IVF -supportive care/Zofran/phenergan  3. Hypertension -stable, on Accupril and amlodipine.  4. Chronic low back pain  -on Duragesic patch, followed by pain clinic  5. History of depression  -cut down home meds as above.  6. History of abdominal artery aneurysm status post repair  - CT abdomen benign  7. Enlarged paratracheal lymph node -needs follow-up as outpatient.  DVT prophylaxis: SCDs-due to low platelets Code Status: Full code.  Family Communication: No family at the bedside.  Disposition Plan: Home tomorrow if stable   Consultants:    Subjective: Has severe nausea today  Objective: Vitals:   06/02/16 1400 06/02/16 1500 06/02/16 2053 06/03/16 0431  BP:   135/84 (!) 144/50  Pulse:   65 60  Resp: 18 15 20 20   Temp:   98.4 F (36.9 C) 98.4 F (36.9 C)  TempSrc:      SpO2:  100% 94% 93%  Weight:      Height:         Intake/Output Summary (Last 24 hours) at 06/03/16 1302 Last data filed at 06/02/16 2326  Gross per 24 hour  Intake              480 ml  Output              670 ml  Net             -190 ml   Filed Weights   06/01/16 0200  Weight: 109.9 kg (242 lb 4.8 oz)    Examination:  General exam: AAOx3, no distress Respiratory system: Clear to auscultation. Respiratory effort normal. Cardiovascular system: S1 & S2 heard, RRR. No JVD, murmurs, rubs, gallops or clicks Gastrointestinal system: Abdomen is nondistended, soft and nontender. Normal bowel sounds heard. Central nervous system: Alert and oriented. No focal neurological deficits. Extremities: Symmetric 5 x 5 power. Skin: No rashes, lesions or ulcers Psychiatry: Judgement and insight appear normal. Mood & affect appropriate.     Data Reviewed: I have personally reviewed following labs and imaging studies  CBC:  Recent Labs Lab 05/31/16 1849 05/31/16 1905 06/01/16 0110 06/02/16 0153  WBC 9.6  --  10.1 8.7  HGB 16.9 17.0 17.4* 15.5  HCT 50.0 50.0 50.1 46.2  MCV 84.2  --  84.1 85.1  PLT 131*  --  130* 016*   Basic Metabolic Panel:  Recent Labs Lab 05/31/16 1849 05/31/16 1905 06/01/16 0110 06/02/16 0153  NA 140 141 140 138  K 4.1 4.4 3.7 3.1*  CL 105 107 106 104  CO2 21*  --  17* 23  GLUCOSE 93 93 94 83  BUN 13 17 15  22*  CREATININE 0.96 0.90 1.01 1.07  CALCIUM 10.5*  --  10.2 9.1   GFR: Estimated Creatinine Clearance: 81 mL/min (by C-G formula based on SCr of 1.07 mg/dL). Liver Function Tests:  Recent Labs Lab 05/31/16 2033 06/01/16 0110  AST 41 46*  ALT 22 23  ALKPHOS 77 74  BILITOT 1.4* 1.4*  PROT 7.4 7.4  ALBUMIN 4.2 4.1    Recent Labs Lab 06/01/16 1027  LIPASE 13    Recent Labs Lab 05/31/16 2033  AMMONIA 25   Coagulation Profile:  Recent Labs Lab 05/31/16 1849  INR 1.10   Cardiac Enzymes: No results for input(s): CKTOTAL, CKMB, CKMBINDEX, TROPONINI in the last 168 hours. BNP  (last 3 results) No results for input(s): PROBNP in the last 8760 hours. HbA1C: No results for input(s): HGBA1C in the last 72 hours. CBG: No results for input(s): GLUCAP in the last 168 hours. Lipid Profile: No results for input(s): CHOL, HDL, LDLCALC, TRIG, CHOLHDL, LDLDIRECT in the last 72 hours. Thyroid Function Tests: No results for input(s): TSH, T4TOTAL, FREET4, T3FREE, THYROIDAB in the last 72 hours. Anemia Panel: No results for input(s): VITAMINB12, FOLATE, FERRITIN, TIBC, IRON, RETICCTPCT in the last 72 hours. Urine analysis:    Component Value Date/Time   COLORURINE STRAW (A) 06/01/2016 0119   APPEARANCEUR CLEAR 06/01/2016 0119   LABSPEC 1.012 06/01/2016 0119   PHURINE 6.0 06/01/2016 0119   GLUCOSEU NEGATIVE 06/01/2016 0119   HGBUR NEGATIVE 06/01/2016 0119   BILIRUBINUR NEGATIVE 06/01/2016 0119   KETONESUR NEGATIVE 06/01/2016 0119   PROTEINUR NEGATIVE 06/01/2016 0119   UROBILINOGEN 0.2 07/13/2011 1406   NITRITE NEGATIVE 06/01/2016 0119   LEUKOCYTESUR NEGATIVE 06/01/2016 0119   Sepsis Labs: @LABRCNTIP (procalcitonin:4,lacticidven:4)  ) Recent Results (from the past 240 hour(s))  Blood culture (routine x 2)     Status: None (Preliminary result)   Collection Time: 05/31/16  8:50 PM  Result Value Ref Range Status   Specimen Description BLOOD RIGHT HAND  Final   Special Requests IN PEDIATRIC BOTTLE 2CC  Final   Culture NO GROWTH 3 DAYS  Final   Report Status PENDING  Incomplete  Blood culture (routine x 2)     Status: None (Preliminary result)   Collection Time: 05/31/16  9:02 PM  Result Value Ref Range Status   Specimen Description BLOOD LEFT HAND  Final   Special Requests IN PEDIATRIC BOTTLE 2CC  Final   Culture NO GROWTH 3 DAYS  Final   Report Status PENDING  Incomplete  MRSA PCR Screening     Status: None   Collection Time: 06/01/16  2:05 AM  Result Value Ref Range Status   MRSA by PCR NEGATIVE NEGATIVE Final    Comment:        The GeneXpert MRSA Assay  (FDA approved for NASAL specimens only), is one component of a comprehensive MRSA colonization surveillance program. It is not intended to diagnose MRSA infection nor to guide or monitor treatment for MRSA infections.          Radiology Studies: No results found.      Scheduled Meds: . amLODipine  10 mg Oral Daily  . aspirin  162 mg Oral Daily  . atorvastatin  80 mg Oral q1800  . bisacodyl  10 mg Rectal Once  . calcium carbonate  1,250 mg Oral Q breakfast  . cycloSPORINE  1 drop Both Eyes BID  .  doxepin  25 mg Oral QHS  . famotidine  20 mg Oral BID  . fentaNYL  100 mcg Transdermal Q48H  . [START ON 06/04/2016] lisinopril  20 mg Oral Daily  . magnesium oxide  400 mg Oral Daily  . multivitamin with minerals  1 tablet Oral Daily  . niacin  500 mg Oral QHS  . polyethylene glycol  17 g Oral Daily  . pregabalin  100 mg Oral BID  . promethazine  12.5 mg Intravenous Once  . QUEtiapine  100 mg Oral QHS  . traZODone  100 mg Oral QHS  . venlafaxine XR  75 mg Oral Q breakfast  . vitamin C  500 mg Oral Daily  . vitamin E  1,000 Units Oral Daily   Continuous Infusions:   LOS: 2 days    Time spent: 8min    Domenic Polite, MD [facility] Pager [phone]  If 7PM-7AM, please contact night-coverage [email or web address] Password [email or web address] 06/03/2016, 1:02 PM

## 2016-06-04 DIAGNOSIS — I10 Essential (primary) hypertension: Secondary | ICD-10-CM

## 2016-06-04 LAB — COMPREHENSIVE METABOLIC PANEL
ALT: 28 U/L (ref 17–63)
AST: 31 U/L (ref 15–41)
Albumin: 3.5 g/dL (ref 3.5–5.0)
Alkaline Phosphatase: 67 U/L (ref 38–126)
Anion gap: 8 (ref 5–15)
BUN: 26 mg/dL — ABNORMAL HIGH (ref 6–20)
CO2: 27 mmol/L (ref 22–32)
Calcium: 9.2 mg/dL (ref 8.9–10.3)
Chloride: 104 mmol/L (ref 101–111)
Creatinine, Ser: 1.03 mg/dL (ref 0.61–1.24)
GFR calc Af Amer: >60 mL/min (ref >60)
GFR calc non Af Amer: >60 mL/min (ref >60)
Glucose, Bld: 94 mg/dL (ref 65–99)
Potassium: 3.9 mmol/L (ref 3.5–5.1)
Sodium: 139 mmol/L (ref 135–145)
Total Bilirubin: 0.8 mg/dL (ref 0.3–1.2)
Total Protein: 6.5 g/dL (ref 6.5–8.1)

## 2016-06-04 LAB — CBC
HCT: 45.9 % (ref 39.0–52.0)
Hemoglobin: 15.1 g/dL (ref 13.0–17.0)
MCH: 28.4 pg (ref 26.0–34.0)
MCHC: 32.9 g/dL (ref 30.0–36.0)
MCV: 86.3 fL (ref 78.0–100.0)
Platelets: 116 10*3/uL — ABNORMAL LOW (ref 150–400)
RBC: 5.32 MIL/uL (ref 4.22–5.81)
RDW: 15.4 % (ref 11.5–15.5)
WBC: 9.1 10*3/uL (ref 4.0–10.5)

## 2016-06-04 MED ORDER — KETOROLAC TROMETHAMINE 15 MG/ML IJ SOLN
15.0000 mg | Freq: Once | INTRAMUSCULAR | Status: AC
  Administered 2016-06-04: 15 mg via INTRAVENOUS
  Filled 2016-06-04: qty 1

## 2016-06-04 MED ORDER — SENNOSIDES-DOCUSATE SODIUM 8.6-50 MG PO TABS
1.0000 | ORAL_TABLET | Freq: Two times a day (BID) | ORAL | Status: DC
  Administered 2016-06-04 – 2016-06-05 (×3): 1 via ORAL
  Filled 2016-06-04 (×3): qty 1

## 2016-06-04 MED ORDER — BISACODYL 10 MG RE SUPP
10.0000 mg | Freq: Once | RECTAL | Status: AC
  Administered 2016-06-04: 10 mg via RECTAL
  Filled 2016-06-04: qty 1

## 2016-06-04 MED ORDER — CLONAZEPAM 0.5 MG PO TABS
0.2500 mg | ORAL_TABLET | Freq: Two times a day (BID) | ORAL | Status: DC
  Administered 2016-06-04 – 2016-06-05 (×3): 0.25 mg via ORAL
  Filled 2016-06-04 (×2): qty 1

## 2016-06-04 NOTE — Progress Notes (Signed)
Pt says tylenol is not helping with his pain .Pt says can he have some Motrin. MD on call was notified.  He called back and advised to give one time Toradol and advised pt to follow up with day MD. Pt was informed. Will continue to monitor.

## 2016-06-04 NOTE — Progress Notes (Signed)
PROGRESS NOTE    TRONG GOSLING  HYW:[id number] DOB: 06/26/1945 DOA: 05/31/2016 PCP: Tawanna Solo, MD Brief Narrative: [name] admitted with drowsiness/confusion, he had a Colonoscopy day prior to admission. CT abd unremarkable, :Labs, urine, ETOH level, CT head all unremarkable. Encephalopathy was related to Polypharmacy, pt is on an alarmingly high doses of 10 or more sedating meds including  Assessment & Plan: 1.  Acute encephalopathy -resolved, back to baseline, AAOx3, appropriate -CT abd unremarkable, Labs, urine, ETOH level, CT head all unremarkable -suspect this is related to Polypharmacy, pt is on an alarmingly high doses of 10 or more sedating meds including Fentanyl 132mcg, Lyrica 223mg  TID(above max dose of 450mg ), Trazodone 400mg  daily, Seroquel 250mg  QD, Effexor 225mg  QD, Robaxin 750mg  TID, Meclizine 25mg  , Doxepin 50mg  QHS, Benadryl 25mg  QID PRN, Klonopin 0.25mg  BID PRN -I have cut down doses of Lyrica, Trazodone, Seroquel, Effexor, RObaxin, Doxepin  -Blood Cx negative -ambulating with PT  2. Nausea/Vomiting -improving -suspect due to mild withdrawal, give all am meds, didn't get clonopin last 2days, resumed -constipation-contributing to, added Mirapex, Senokot and suppository -supportive care/Zofran/phenergan  3. Hypertension -stable, on Accupril and amlodipine.  4. Chronic low back pain  -on Duragesic patch, followed by pain clinic  5. History of depression  -cut down home meds as above.  6. History of abdominal artery aneurysm status post repair  - CT abdomen benign  7. Enlarged paratracheal lymph node -needs follow-up as outpatient.  DVT prophylaxis: SCDs-due to low platelets Code Status: Full code.  Family Communication: No family at the bedside.  Disposition Plan: Home tomorrow if stable   Consultants:    Subjective: Still with nausea but improving, hasn't had a bowel movement in 4-5 days since his colonoscopy  Objective: Vitals:   06/03/16  2108 06/03/16 2354 06/04/16 0547 06/04/16 0903  BP: (!) 160/81 137/73 (!) 176/88 (!) 164/78  Pulse: 69 69 63   Resp: 20  19   Temp: 98.2 F (36.8 C)  98.2 F (36.8 C)   TempSrc:      SpO2: 99% 96% 100%   Weight:      Height:        Intake/Output Summary (Last 24 hours) at 06/04/16 1132 Last data filed at 06/04/16 1054  Gross per 24 hour  Intake             1200 ml  Output              950 ml  Net              250 ml   Filed Weights   06/01/16 0200  Weight: 109.9 kg (242 lb 4.8 oz)    Examination:  General exam: AAOx3, no distress Respiratory system: Clear to auscultation. Respiratory effort normal. Cardiovascular system: S1 & S2 heard, RRR. No JVD, murmurs, rubs, gallops or clicks Gastrointestinal system: Abdomen is nondistended, soft and nontender. Normal bowel sounds heard. Central nervous system: Alert and oriented. No focal neurological deficits. Extremities: Symmetric 5 x 5 power. Skin: No rashes, lesions or ulcers Psychiatry: Judgement and insight appear normal. Mood & affect appropriate.     Data Reviewed: I have personally reviewed following labs and imaging studies  CBC:  Recent Labs Lab 05/31/16 1849 05/31/16 1905 06/01/16 0110 06/02/16 0153 06/04/16 0408  WBC 9.6  --  10.1 8.7 9.1  HGB 16.9 17.0 17.4* 15.5 15.1  HCT 50.0 50.0 50.1 46.2 45.9  MCV 84.2  --  84.1 85.1 86.3  PLT 131*  --  130* 114* 381*   Basic Metabolic Panel:  Recent Labs Lab 05/31/16 1849 05/31/16 1905 06/01/16 0110 06/02/16 0153 06/04/16 0408  NA 140 141 140 138 139  K 4.1 4.4 3.7 3.1* 3.9  CL 105 107 106 104 104  CO2 21*  --  17* 23 27  GLUCOSE 93 93 94 83 94  BUN 13 17 15  22* 26*  CREATININE 0.96 0.90 1.01 1.07 1.03  CALCIUM 10.5*  --  10.2 9.1 9.2   GFR: Estimated Creatinine Clearance: 84.1 mL/min (by C-G formula based on SCr of 1.03 mg/dL). Liver Function Tests:  Recent Labs Lab 05/31/16 2033 06/01/16 0110 06/04/16 0408  AST 41 46* 31  ALT 22 23 28     ALKPHOS 77 74 67  BILITOT 1.4* 1.4* 0.8  PROT 7.4 7.4 6.5  ALBUMIN 4.2 4.1 3.5    Recent Labs Lab 06/01/16 1027  LIPASE 13    Recent Labs Lab 05/31/16 2033  AMMONIA 25   Coagulation Profile:  Recent Labs Lab 05/31/16 1849  INR 1.10   Cardiac Enzymes: No results for input(s): CKTOTAL, CKMB, CKMBINDEX, TROPONINI in the last 168 hours. BNP (last 3 results) No results for input(s): PROBNP in the last 8760 hours. HbA1C: No results for input(s): HGBA1C in the last 72 hours. CBG: No results for input(s): GLUCAP in the last 168 hours. Lipid Profile: No results for input(s): CHOL, HDL, LDLCALC, TRIG, CHOLHDL, LDLDIRECT in the last 72 hours. Thyroid Function Tests: No results for input(s): TSH, T4TOTAL, FREET4, T3FREE, THYROIDAB in the last 72 hours. Anemia Panel: No results for input(s): VITAMINB12, FOLATE, FERRITIN, TIBC, IRON, RETICCTPCT in the last 72 hours. Urine analysis:    Component Value Date/Time   COLORURINE STRAW (A) 06/01/2016 0119   APPEARANCEUR CLEAR 06/01/2016 0119   LABSPEC 1.012 06/01/2016 0119   PHURINE 6.0 06/01/2016 0119   GLUCOSEU NEGATIVE 06/01/2016 0119   HGBUR NEGATIVE 06/01/2016 0119   BILIRUBINUR NEGATIVE 06/01/2016 0119   KETONESUR NEGATIVE 06/01/2016 0119   PROTEINUR NEGATIVE 06/01/2016 0119   UROBILINOGEN 0.2 07/13/2011 1406   NITRITE NEGATIVE 06/01/2016 0119   LEUKOCYTESUR NEGATIVE 06/01/2016 0119   Sepsis Labs: @LABRCNTIP (procalcitonin:4,lacticidven:4)  ) Recent Results (from the past 240 hour(s))  Blood culture (routine x 2)     Status: None (Preliminary result)   Collection Time: 05/31/16  8:50 PM  Result Value Ref Range Status   Specimen Description BLOOD RIGHT HAND  Final   Special Requests IN PEDIATRIC BOTTLE 2CC  Final   Culture NO GROWTH 3 DAYS  Final   Report Status PENDING  Incomplete  Blood culture (routine x 2)     Status: None (Preliminary result)   Collection Time: 05/31/16  9:02 PM  Result Value Ref Range Status    Specimen Description BLOOD LEFT HAND  Final   Special Requests IN PEDIATRIC BOTTLE 2CC  Final   Culture NO GROWTH 3 DAYS  Final   Report Status PENDING  Incomplete  MRSA PCR Screening     Status: None   Collection Time: 06/01/16  2:05 AM  Result Value Ref Range Status   MRSA by PCR NEGATIVE NEGATIVE Final    Comment:        The GeneXpert MRSA Assay (FDA approved for NASAL specimens only), is one component of a comprehensive MRSA colonization surveillance program. It is not intended to diagnose MRSA infection nor to guide or monitor treatment for MRSA infections.          Radiology Studies: No results found.  Scheduled Meds: . amLODipine  10 mg Oral Daily  . aspirin  162 mg Oral Daily  . atorvastatin  80 mg Oral q1800  . calcium carbonate  1,250 mg Oral Q breakfast  . clonazePAM  0.25 mg Oral BID  . cycloSPORINE  1 drop Both Eyes BID  . doxepin  25 mg Oral QHS  . famotidine  20 mg Oral BID  . fentaNYL  100 mcg Transdermal Q48H  . lisinopril  20 mg Oral Daily  . magnesium oxide  400 mg Oral Daily  . multivitamin with minerals  1 tablet Oral Daily  . niacin  500 mg Oral QHS  . polyethylene glycol  17 g Oral Daily  . pregabalin  100 mg Oral BID  . QUEtiapine  100 mg Oral QHS  . senna-docusate  1 tablet Oral BID  . traZODone  100 mg Oral QHS  . venlafaxine XR  75 mg Oral Q breakfast  . vitamin C  500 mg Oral Daily  . vitamin E  1,000 Units Oral Daily   Continuous Infusions:   LOS: 3 days    Time spent: 34min    Domenic Polite, MD [facility] Pager [phone]  If 7PM-7AM, please contact night-coverage [email or web address] Password [facility] 06/04/2016, 11:32 AM

## 2016-06-04 NOTE — Evaluation (Signed)
Occupational Therapy Evaluation Patient Details Name: WALID HAIG MRN: [id number] DOB: 1945-04-16 Today's Date: 06/04/2016    History of Present Illness Pt adm with AMS. Pt on multiple sedating home meds. PMH - multiple back surgeries and chronic back pain.  PMH -HTN, depression, AAA repair, arthritis, bipolar, chf   Clinical Impression   Pt reports he was independent with ADL PTA. Currently pt overall min guard for ADL and functional mobility. Pt limited today by fatigue and c/o nausea; seems to be mildly confused at this times with decreased short term memory and repeating himself throughout session multiple times. Pt planning to d/c home with intermittent supervision from friends. Recommending HHOT for follow up to maximize independence and safety with ADL and functional mobility upon return home. Pt would benefit from continued skilled OT to address established goals.    Follow Up Recommendations  Home health OT;Supervision - Intermittent    Equipment Recommendations  None recommended by OT    Recommendations for Other Services       Precautions / Restrictions Precautions Precautions: Fall Restrictions Weight Bearing Restrictions: No      Mobility Bed Mobility Overal bed mobility: Modified Independent                Transfers Overall transfer level: Needs assistance Equipment used: None Transfers: Sit to/from Stand Sit to Stand: Min guard         General transfer comment: Mild unsteadiness, no physical assist required    Balance Overall balance assessment: Needs assistance Sitting-balance support: Feet supported;No upper extremity supported Sitting balance-Leahy Scale: Good     Standing balance support: Bilateral upper extremity supported Standing balance-Leahy Scale: Fair                             ADL either performed or assessed with clinical judgement   ADL Overall ADL's : Needs assistance/impaired Eating/Feeding: Set up;Sitting    Grooming: Set up;Supervision/safety;Sitting;Oral care   Upper Body Bathing: Supervision/ safety;Set up;Sitting   Lower Body Bathing: Min guard;Sit to/from stand   Upper Body Dressing : Set up;Supervision/safety;Sitting   Lower Body Dressing: Min guard;Sit to/from stand   Toilet Transfer: Min guard;Ambulation Toilet Transfer Details (indicate cue type and reason): Simulated by functional mobility in room         Functional mobility during ADLs: Min guard General ADL Comments: Poor standing tolerance and activity tolerance; reports fatigue today.     Vision         Perception     Praxis      Pertinent Vitals/Pain Pain Assessment: Faces Faces Pain Scale: Hurts a little bit Pain Location: stomach/nausea Pain Descriptors / Indicators: Other (Comment) (nauseated) Pain Intervention(s): Limited activity within patient's tolerance;Monitored during session;Other (comment) (pt requesting nausea meds-RN notified)     Hand Dominance Left   Extremity/Trunk Assessment Upper Extremity Assessment Upper Extremity Assessment: Overall WFL for tasks assessed   Lower Extremity Assessment Lower Extremity Assessment: Defer to PT evaluation       Communication Communication Communication: No difficulties   Cognition Arousal/Alertness: Awake/alert Behavior During Therapy: Flat affect Overall Cognitive Status: No family/caregiver present to determine baseline cognitive functioning                                 General Comments: At times pt with decreased short term memory. Continues repeating that he is cold.   General Comments  Exercises     Shoulder Instructions      Home Living Family/patient expects to be discharged to:: Private residence Living Arrangements: Alone Available Help at Discharge: Friend(s);Available PRN/intermittently Type of Home: House Home Access: Ramped entrance     Home Layout: One level     Bathroom Shower/Tub: Tub/shower  unit;Curtain   Biochemist, clinical: Standard     Home Equipment: Cane - quad;Walker - 2 wheels;Shower seat;Grab bars - toilet          Prior Functioning/Environment Level of Independence: Independent with assistive device(s)        Comments: Amb with quad cane        OT Problem List: Decreased strength;Impaired balance (sitting and/or standing);Decreased cognition;Decreased safety awareness;Obesity      OT Treatment/Interventions: Self-care/ADL training;Energy conservation;DME and/or AE instruction;Therapeutic activities;Patient/family education;Balance training    OT Goals(Current goals can be found in the care plan section) Acute Rehab OT Goals Patient Stated Goal: feel better OT Goal Formulation: With patient Time For Goal Achievement: 06/18/16 Potential to Achieve Goals: Good ADL Goals Pt Will Perform Grooming: with modified independence;standing Pt Will Transfer to Toilet: with modified independence;ambulating;regular height toilet Pt Will Perform Toileting - Clothing Manipulation and hygiene: with modified independence;sit to/from stand Pt Will Perform Tub/Shower Transfer: Tub transfer;with modified independence;ambulating;shower seat Additional ADL Goal #1: Pt will independently verbally recall 3 energy conservation strategies.  OT Frequency: Min 2X/week   Barriers to D/C: Decreased caregiver support  lives alone       Co-evaluation              End of Session Nurse Communication: Other (comment) (pt requesting nausea meds)  Activity Tolerance: Patient limited by fatigue Patient left: in bed;with call bell/phone within reach  OT Visit Diagnosis: Unsteadiness on feet (R26.81);Muscle weakness (generalized) (M62.81)                Time: 5248-1859 OT Time Calculation (min): 12 min Charges:  OT General Charges $OT Visit: 1 Procedure OT Evaluation $OT Eval Moderate Complexity: 1 Procedure G-Codes:     Gissell Barra A. Ulice Brilliant, M.S., OTR/L Pager:  [place] 06/04/2016, 4:23 PM

## 2016-06-05 DIAGNOSIS — R479 Unspecified speech disturbances: Secondary | ICD-10-CM

## 2016-06-05 LAB — CULTURE, BLOOD (ROUTINE X 2)
Culture: NO GROWTH
Culture: NO GROWTH

## 2016-06-05 MED ORDER — TRAZODONE HCL 100 MG PO TABS: 100.0000 mg | ORAL_TABLET | Freq: Every day | ORAL | Status: AC

## 2016-06-05 MED ORDER — PREGABALIN 100 MG PO CAPS: 100.0000 mg | ORAL_CAPSULE | Freq: Two times a day (BID) | ORAL | 0 refills | Status: DC

## 2016-06-05 MED ORDER — VENLAFAXINE HCL ER 75 MG PO CP24: 75.0000 mg | ORAL_CAPSULE | Freq: Every day | ORAL | Status: DC

## 2016-06-05 MED ORDER — DOXEPIN HCL 25 MG PO CAPS: 25.0000 mg | ORAL_CAPSULE | Freq: Every day | ORAL | Status: DC

## 2016-06-05 MED ORDER — SENNOSIDES-DOCUSATE SODIUM 8.6-50 MG PO TABS: 1.0000 | ORAL_TABLET | Freq: Two times a day (BID) | ORAL | 1 refills | Status: DC

## 2016-06-05 MED ORDER — QUETIAPINE FUMARATE 100 MG PO TABS: 100.0000 mg | ORAL_TABLET | Freq: Every day | ORAL | Status: AC

## 2016-06-05 MED ORDER — POLYETHYLENE GLYCOL 3350 17 G PO PACK: 17.0000 g | PACK | Freq: Two times a day (BID) | ORAL | 0 refills | Status: DC

## 2016-06-05 NOTE — Progress Notes (Signed)
PT Cancellation Note  Patient Details Name: Michael Lutz MRN: [id number] DOB: 1945/08/28   Cancelled Treatment:    Reason Eval/Treat Not Completed: Patient declined, no reason specified (Pt denies any need for mobility and reports he is at baseline.  PLan to d/c home this pm .  Nursing in room reviewing d/c paper work.  Pt denies any concerns.  )   Janson Lamar Eli Hose 06/05/2016, 11:36 AM Governor Rooks, PTA pager [phone]

## 2016-06-05 NOTE — Care Management Note (Signed)
Case Management Note  Patient Details  Name: Michael Lutz MRN: [id number] Date of Birth: 27-Mar-1945  Subjective/Objective:                 Admitted with Acute encephalopathy.   Action/Plan: Plan is to d/c to home. Pt states he has support bar throughout his home and outside his home . Decline home health services. States family and friends will help if need once d/c.  Expected Discharge Date:  06/05/16               Expected Discharge Plan:  [place]  In-House Referral:     Discharge planning Services  CM Consult  Post Acute Care Choice:    Choice offered to: patient  DME Arranged:    DME Agency:     HH Arranged:  PT, OT [place] Agency:  [place] (referral made with Butch Penny @ [phone]) After speaking with [facility] rep. pt learned he would have a copay for home therapy and declined therapy.  Status of Service:  Completed, signed off  If discussed at [place] of Stay Meetings, dates discussed:    Additional Comments:  Sharin Mons, RN 06/05/2016, 11:57 AM

## 2016-06-05 NOTE — Discharge Summary (Signed)
Physician Discharge Summary  Michael Lutz HQI:[id number] DOB: 24-Jul-1945 DOA: 05/31/2016  PCP: Tawanna Solo, MD  Admit date: 05/31/2016 Discharge date: 06/05/2016  Time spent: 35 minutes  Pain medicine Dr.Phillips Psychiatrist: Dr.Plovsky  Recommendations for Outpatient Follow-up:  1. PLEASE EXERCISE CAUTION with Mr.Budnick's medications, he was on 10 SEDATING medicines at very high doses prior to admission, we have cut down several doses this admission 2. Enlarged paratracheal lymph node needs follow up  Discharge Diagnoses:  Principal Problem:   Acute encephalopathy   Confusion   Chronic low back pain   Abdominal aortic aneurysm (HCC)   Essential hypertension   Nausea and Vomiting   Enlarged paratracheal lymph node needs FU  Discharge Condition: stable  Diet recommendation: low sodium diet  Filed Weights   06/01/16 0200  Weight: 109.9 kg (242 lb 4.8 oz)    History of present illness:  [name] admitted with drowsiness/confusion, he had a Colonoscopy day prior to admission. CT abd unremarkable, :Labs, urine, ETOH level, CT head all unremarkable. Encephalopathy was related to Polypharmacy, pt is on an alarmingly high doses of 10 or more sedating meds   Hospital Course:  1.  Acute encephalopathy -resolved, back to baseline, AAOx3, appropriate -CT abd unremarkable, Labs, urine, ETOH level, CT head all unremarkable -suspect this is related to Polypharmacy, pt is on an alarmingly high doses of 10 or more sedating meds including Fentanyl 111mcg, Lyrica 223mg  TID(above max dose of 450mg ), Trazodone 400mg  daily, Seroquel 250mg  QD, Effexor 225mg  QD, Robaxin 750mg  TID, Meclizine 25mg  , Doxepin 50mg  QHS, Benadryl 25mg  QID PRN, Klonopin 0.25mg  BID PRN -I have cut down doses of Lyrica, Trazodone, Seroquel, Effexor, RObaxin, Doxepin  -Blood Cx negative -ambulating with PT, home health recommended, back to baseline now  2. Nausea/Vomiting -related to polypharmacy -suspect due to  mild withdrawal, improved and tolerating diet -constipation-also contributing, continue Miralax, Senokot and suppository -above workup was unremarkable  3. Hypertension -stable, on Accupril and amlodipine.  4. Chronic low back pain  -on Duragesic patch, followed by pain clinic  5. History of depression  -cut down home meds as above.  6. History of abdominal artery aneurysm status post repair  - CT abdomen benign  7. Enlarged paratracheal lymph node -needs follow-up as outpatient.   Discharge Exam: Vitals:   06/05/16 0554 06/05/16 0823  BP: 139/83 136/81  Pulse: 62 63  Resp: 18   Temp: 97.8 F (36.6 C)     General: AAOx3 Cardiovascular: S1S2/RRR Respiratory: CTAB  Discharge Instructions   Discharge Instructions    Diet - low sodium heart healthy    Complete by:  As directed    Increase activity slowly    Complete by:  As directed      Current Discharge Medication List    START taking these medications   Details  polyethylene glycol (MIRALAX / GLYCOLAX) packet Take 17 g by mouth 2 (two) times daily. Qty: 14 each, Refills: 0    senna-docusate (SENOKOT-S) 8.6-50 MG tablet Take 1 tablet by mouth 2 (two) times daily. Qty: 10 tablet, Refills: 1      CONTINUE these medications which have CHANGED   Details  doxepin (SINEQUAN) 25 MG capsule Take 1 capsule (25 mg total) by mouth at bedtime.    pregabalin (LYRICA) 100 MG capsule Take 1 capsule (100 mg total) by mouth 2 (two) times daily. Qty: 60 capsule, Refills: 0    QUEtiapine (SEROQUEL) 100 MG tablet Take 1 tablet (100 mg total) by mouth at bedtime.  traZODone (DESYREL) 100 MG tablet Take 1 tablet (100 mg total) by mouth at bedtime.    venlafaxine XR (EFFEXOR-XR) 75 MG 24 hr capsule Take 1 capsule (75 mg total) by mouth daily with breakfast.      CONTINUE these medications which have NOT CHANGED   Details  clonazePAM (KLONOPIN) 0.5 MG tablet Take 0.25 mg by mouth every 12 (twelve) hours as needed  for anxiety.     fentaNYL (DURAGESIC - DOSED MCG/HR) 100 MCG/HR Place 100 mcg onto the skin every other day.     5-Hydroxytryptophan (5-HTP) 100 MG CAPS Take 100 mg by mouth daily.    amLODipine (NORVASC) 10 MG tablet Take 10 mg by mouth daily.     Ascorbic Acid (VITAMIN C) 1000 MG tablet Take 500 mg by mouth daily.     aspirin 325 MG tablet Take 162 mg by mouth daily.     atorvastatin (LIPITOR) 80 MG tablet Take 80 mg by mouth daily.    b complex vitamins tablet Take 1 tablet by mouth every morning.    calcium carbonate (OS-CAL) 600 MG TABS Take 600 mg by mouth daily.      cholecalciferol (VITAMIN D) 1000 UNITS tablet Take 2,000 Units by mouth every morning.     DHEA 50 MG CAPS Take 50 mg by mouth daily.    furosemide (LASIX) 40 MG tablet Take 80 mg by mouth as needed. For fluid retention    ibuprofen (ADVIL,MOTRIN) 200 MG tablet Take 200 mg by mouth every 6 (six) hours as needed. For pain    Lysine 1000 MG TABS Take 1,000 mg by mouth daily.    magnesium oxide (MAG-OX) 400 MG tablet Take 400 mg by mouth daily.      Multiple Vitamin (MULTIVITAMIN) tablet Take 1 tablet by mouth daily.      niacin (NIASPAN) 500 MG CR tablet Take 500 mg by mouth at bedtime.    Potassium (GNP POTASSIUM) 99 MG TABS     quinapril (ACCUPRIL) 40 MG tablet Take 80 mg by mouth daily.     ranitidine (ZANTAC) 75 MG tablet Take 75 mg by mouth daily as needed for heartburn.    RESTASIS 0.05 % ophthalmic emulsion Place 1 drop into both eyes 2 (two) times daily.     simethicone (MYLICON) 660 MG chewable tablet Chew 125 mg by mouth as needed for flatulence.    vitamin E 1000 UNIT capsule Take 1,000 Units by mouth daily.      STOP taking these medications     methocarbamol (ROBAXIN) 750 MG tablet      diphenhydrAMINE (BENADRYL) 25 mg capsule      meclizine (ANTIVERT) 25 MG tablet      pseudoephedrine (SUDAFED) 30 MG tablet        No Known Allergies Follow-up Information    Tawanna Solo,  MD. Schedule an appointment as soon as possible for a visit in 1 week(s).   Specialty:  Family Medicine Contact information: [place] [address] [phone]            The results of significant diagnostics from this hospitalization (including imaging, microbiology, ancillary and laboratory) are listed below for reference.    Significant Diagnostic Studies: Ct Head Wo Contrast  Result Date: 05/31/2016 CLINICAL DATA:  Headache. Neck and back pain. Chest pain. Confusion beginning this morning. EXAM: CT HEAD WITHOUT CONTRAST TECHNIQUE: Contiguous axial images were obtained from the base of the skull through the vertex without intravenous contrast. COMPARISON:  None. FINDINGS: Brain: Moderate generalized atrophy is stable. Prominence of the extra-axial spaces, asymmetric on the left, is also stable. No acute cortical infarct, hemorrhage, or mass lesion is present. The ventricles are proportionate to the degree of atrophy. The basal ganglia and insular ribbon are intact. The brainstem and cerebellum are unremarkable. Vascular: Atherosclerotic calcifications are present within the cavernous internal carotid arteries bilaterally. No focal hyperdense vessel is present. There is moderate density of the vascularity diffusely compatible with a relatively hemo concentrated state. Skull: No focal lytic or blastic lesions are present. The calvarium is intact. Sinuses/Orbits: The paranasal sinuses are clear. Wall thickening in the maxillary sinuses bilaterally, right greater than left, suggests a history of chronic disease. There is no active disease. Minimal fluid is present in the inferior right mastoid air cells the mastoid air cells and middle ear cavities are otherwise clear. No obstructing lesion is present. IMPRESSION: 1. Moderate generalized atrophy with slight progression since the prior exam. 2. No acute intracranial abnormality. 3. Atherosclerosis. Electronically Signed   By:  San Morelle M.D.   On: 05/31/2016 20:14   Dg Chest Portable 1 View  Result Date: 05/31/2016 CLINICAL DATA:  Colonoscopy yesterday. Today with shortness of breath, disoriented, weakness, nausea. EXAM: PORTABLE CHEST 1 VIEW COMPARISON:  Chest x-rays dated 03/02/2016 and 02/16/2012. FINDINGS: Heart size and mediastinal contours appear stable. There is new central pulmonary vascular congestion and bilateral pulmonary edema. Suspect underlying pulmonary artery hypertension as well. No pleural effusion or pneumothorax seen. No acute or suspicious osseous finding. Fixation rods appear grossly stable in position within the thoracic spine. IMPRESSION: 1. New central pulmonary vascular congestion and bilateral pulmonary edema suggesting CHF/volume overload. 2.  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 Electronically Signed   By: Franki Cabot M.D.   On: 05/31/2016 18:28   Ct Angio Chest/abd/pel For Dissection W And/or Wo Contrast  Result Date: 05/31/2016 CLINICAL DATA:  Acute onset of headache, neck pain, back pain, chest pain, shortness of breath and confusion. Initial encounter. EXAM: CT ANGIOGRAPHY CHEST, ABDOMEN AND PELVIS TECHNIQUE: Multidetector CT imaging through the chest, abdomen and pelvis was performed using the standard protocol during bolus administration of intravenous contrast. Multiplanar reconstructed images and MIPs were obtained and reviewed to evaluate the vascular anatomy. CONTRAST:  100 mL of Isovue 370 IV contrast COMPARISON:  Chest radiograph performed earlier today at 6:03 p.m., CTA of the abdomen and pelvis performed 01/08/2014, and CTA of the chest performed 07/28/2011 FINDINGS: CTA CHEST FINDINGS Cardiovascular: Scattered calcification is noted along the aortic arch and descending thoracic aorta. There is no evidence of aneurysmal dilatation. There is no evidence of aortic dissection. The great vessels are grossly unremarkable in appearance, aside from minimal calcification.  There is no evidence of significant pulmonary embolus. Diffuse coronary artery calcifications are seen. Mediastinum/Nodes: An enlarged 2.1 cm right paratracheal node is seen. No additional mediastinal lymphadenopathy is appreciated. No pericardial effusion is identified. The thyroid gland is grossly unremarkable in appearance. No axillary lymphadenopathy is seen. Lungs/Pleura: Bilateral emphysematous change is noted. No focal consolidation, pleural effusion or pneumothorax is seen. No masses are identified. Musculoskeletal: No acute osseous abnormalities are seen. The visualized musculature is grossly unremarkable. Review of the MIP images confirms the above findings. CTA ABDOMEN AND PELVIS FINDINGS VASCULAR Aorta: An aortoiliac stent graft is fully patent. Scattered calcification is seen along the abdominal aorta and its branches. Celiac: Minimal calcification is noted at the origin of the celiac trunk. Diffuse calcification is seen along the splenic artery. SMA:  The superior mesenteric artery remains patent, with minimal calcification at the origin of the superior mesenteric artery. Renals: Mild calcification is noted along the proximal left renal artery. The renal arteries are otherwise unremarkable. IMA: The inferior mesenteric artery is not visualized. Inflow: Scattered calcification is seen along the common, internal and external iliac arteries bilaterally. Evaluation of the arterial system distal to the common femoral arteries is limited due to limitations in the timing of the contrast bolus. Scattered calcification is seen along the superficial femoral arteries bilaterally, along the popliteal arteries, and extending distally into both calves. The vasculature is not well assessed at the level of the calves. The profunda femoris arteries appear grossly intact bilaterally. Veins: Visualized venous structures are grossly unremarkable, though difficult to fully assess given the phase of contrast enhancement.  Review of the MIP images confirms the above findings. NON-VASCULAR Hepatobiliary: The mildly nodular contour of the liver raises concern for hepatic cirrhosis. Clips are seen at the gallbladder fossa. The common bile duct is normal in caliber. Pancreas: The pancreas is within normal limits. Spleen: The spleen is unremarkable in appearance. Adrenals/Urinary Tract: There is nodularity about both adrenal glands, measuring up to 3.0 cm on the left. This appears relatively stable from 2013 and is likely benign. Numerous bilateral renal cysts are seen. Nonspecific perinephric stranding is noted bilaterally. A nonobstructing 5 mm stone is noted at the interpole region of the left kidney. No hydronephrosis is seen. No obstructing renal stones are identified. Stomach/Bowel: The stomach is unremarkable in appearance. The small bowel is within normal limits. The appendix is not visualized; there is no evidence for appendicitis. Scattered diverticulosis is noted along the ascending colon. The colon is otherwise unremarkable. Lymphatic: No retroperitoneal or pelvic sidewall lymphadenopathy is seen. Reproductive: The bladder is mildly distended and grossly unremarkable. The prostate remains normal in size, with minimal calcification. Other: The visualized portions of the lower extremities are grossly unremarkable in appearance. No knee joint effusions are seen. Vague soft tissue edema is noted tracking about both ankles. No focal fluid collections are seen. Musculoskeletal: No acute osseous abnormalities are identified. Thoracic spinal stimulation leads are noted. The patient is status post thoracolumbar spinal fusion. The visualized musculature is unremarkable in appearance. Review of the MIP images confirms the above findings. IMPRESSION: 1. Limited evaluation of arterial flow to the lower extremities due to limitations in the timing of the contrast bolus. The arterial system remains fully patent to at least the level of the  common femoral arteries bilaterally. 2. Aortoiliac stent graft is fully patent. Scattered aortic atherosclerosis noted. 3. No evidence of aortic dissection. No evidence of aneurysmal dilatation. 4. No evidence of significant pulmonary embolus. 5. Vague soft tissue edema noted tracking about both ankles. Lower extremities are otherwise grossly unremarkable, though the toes are not imaged on this study, and the vasculature of the lower extremities is not well assessed, aside from scattered calcific atherosclerotic disease. 6. Enlarged 2.1 cm right paratracheal node noted, of uncertain significance. This is somewhat more prominent than in 2013. 7. Diffuse coronary artery calcifications seen. 8. Bilateral emphysema noted. 9. Mild changes of hepatic cirrhosis. 10. Nodularity about both adrenal glands appears relatively stable from 2013 and is likely benign. 11. Numerous bilateral renal cysts. Nonobstructing 5 mm stone at the interpole region of the left kidney. 12. Scattered diverticulosis along the ascending colon, without evidence of diverticulitis. Electronically Signed   By: Garald Balding M.D.   On: 05/31/2016 21:17    Microbiology: Recent Results (from  the past 240 hour(s))  Blood culture (routine x 2)     Status: None (Preliminary result)   Collection Time: 05/31/16  8:50 PM  Result Value Ref Range Status   Specimen Description BLOOD RIGHT HAND  Final   Special Requests IN PEDIATRIC BOTTLE 2CC  Final   Culture NO GROWTH 4 DAYS  Final   Report Status PENDING  Incomplete  Blood culture (routine x 2)     Status: None (Preliminary result)   Collection Time: 05/31/16  9:02 PM  Result Value Ref Range Status   Specimen Description BLOOD LEFT HAND  Final   Special Requests IN PEDIATRIC BOTTLE 2CC  Final   Culture NO GROWTH 4 DAYS  Final   Report Status PENDING  Incomplete  MRSA PCR Screening     Status: None   Collection Time: 06/01/16  2:05 AM  Result Value Ref Range Status   MRSA by PCR NEGATIVE  NEGATIVE Final    Comment:        The GeneXpert MRSA Assay (FDA approved for NASAL specimens only), is one component of a comprehensive MRSA colonization surveillance program. It is not intended to diagnose MRSA infection nor to guide or monitor treatment for MRSA infections.      Labs: Basic Metabolic Panel:  Recent Labs Lab 05/31/16 1849 05/31/16 1905 06/01/16 0110 06/02/16 0153 06/04/16 0408  NA 140 141 140 138 139  K 4.1 4.4 3.7 3.1* 3.9  CL 105 107 106 104 104  CO2 21*  --  17* 23 27  GLUCOSE 93 93 94 83 94  BUN 13 17 15  22* 26*  CREATININE 0.96 0.90 1.01 1.07 1.03  CALCIUM 10.5*  --  10.2 9.1 9.2   Liver Function Tests:  Recent Labs Lab 05/31/16 2033 06/01/16 0110 06/04/16 0408  AST 41 46* 31  ALT 22 23 28   ALKPHOS 77 74 67  BILITOT 1.4* 1.4* 0.8  PROT 7.4 7.4 6.5  ALBUMIN 4.2 4.1 3.5    Recent Labs Lab 06/01/16 1027  LIPASE 13    Recent Labs Lab 05/31/16 2033  AMMONIA 25   CBC:  Recent Labs Lab 05/31/16 1849 05/31/16 1905 06/01/16 0110 06/02/16 0153 06/04/16 0408  WBC 9.6  --  10.1 8.7 9.1  HGB 16.9 17.0 17.4* 15.5 15.1  HCT 50.0 50.0 50.1 46.2 45.9  MCV 84.2  --  84.1 85.1 86.3  PLT 131*  --  130* 114* 116*   Cardiac Enzymes: No results for input(s): CKTOTAL, CKMB, CKMBINDEX, TROPONINI in the last 168 hours. BNP: BNP (last 3 results)  Recent Labs  05/31/16 2052  BNP 169.6*    ProBNP (last 3 results) No results for input(s): PROBNP in the last 8760 hours.  CBG: No results for input(s): GLUCAP in the last 168 hours.     SignedDomenic Polite MD.  [facility] 06/05/2016, 9:42 AM

## 2016-06-06 NOTE — Consult Note (Signed)
           [facility] Primary Care Navigator  06/06/2016  Michael Lutz 06-09-1945 [id number]   Wentto see patient today at the bedside to identify possible discharge needs but staffreports that he had beendischarged.  Patient was discharged home with home health services yesterday afternoon.  Primary care provider's office called (Bonnie)to notify of patient's discharge and possible need for post hospital follow-up and transition of care.  Horris Latino notified of MD note on exercising CAUTION with Mr.Davidovich's medications (he was on 10 SEDATING medicines at very high doses prior to admission and several doses had been cut down on this admission ); he needs follow up for enlarged lymph node.  Made aware to refer patient to [facility] care management if deemed appropriate for services (especially [facility] pharmacy for medication management).  For questions, please contact:  Dannielle Huh, BSN, RN- [facility] Primary Care Navigator  Telephone: [phone] [place]

## 2016-06-08 DIAGNOSIS — R079 Chest pain, unspecified: Secondary | ICD-10-CM | POA: Diagnosis not present

## 2016-06-08 DIAGNOSIS — G894 Chronic pain syndrome: Secondary | ICD-10-CM | POA: Diagnosis not present

## 2016-06-08 DIAGNOSIS — M546 Pain in thoracic spine: Secondary | ICD-10-CM | POA: Diagnosis not present

## 2016-06-08 DIAGNOSIS — Z79891 Long term (current) use of opiate analgesic: Secondary | ICD-10-CM | POA: Diagnosis not present

## 2016-06-12 DIAGNOSIS — E7800 Pure hypercholesterolemia, unspecified: Secondary | ICD-10-CM | POA: Diagnosis not present

## 2016-06-12 DIAGNOSIS — Z6841 Body Mass Index (BMI) 40.0 and over, adult: Secondary | ICD-10-CM | POA: Diagnosis not present

## 2016-06-12 DIAGNOSIS — R972 Elevated prostate specific antigen [PSA]: Secondary | ICD-10-CM | POA: Diagnosis not present

## 2016-06-12 DIAGNOSIS — M4327 Fusion of spine, lumbosacral region: Secondary | ICD-10-CM | POA: Diagnosis not present

## 2016-06-12 DIAGNOSIS — I10 Essential (primary) hypertension: Secondary | ICD-10-CM | POA: Diagnosis not present

## 2016-06-12 DIAGNOSIS — I2510 Atherosclerotic heart disease of native coronary artery without angina pectoris: Secondary | ICD-10-CM | POA: Diagnosis not present

## 2016-06-12 DIAGNOSIS — R599 Enlarged lymph nodes, unspecified: Secondary | ICD-10-CM | POA: Diagnosis not present

## 2016-06-12 DIAGNOSIS — G9340 Encephalopathy, unspecified: Secondary | ICD-10-CM | POA: Diagnosis not present

## 2016-06-12 DIAGNOSIS — F39 Unspecified mood [affective] disorder: Secondary | ICD-10-CM | POA: Diagnosis not present

## 2016-06-21 DIAGNOSIS — I809 Phlebitis and thrombophlebitis of unspecified site: Secondary | ICD-10-CM | POA: Diagnosis not present

## 2016-07-11 DIAGNOSIS — Z79891 Long term (current) use of opiate analgesic: Secondary | ICD-10-CM | POA: Diagnosis not present

## 2016-07-11 DIAGNOSIS — G894 Chronic pain syndrome: Secondary | ICD-10-CM | POA: Diagnosis not present

## 2016-07-11 DIAGNOSIS — M961 Postlaminectomy syndrome, not elsewhere classified: Secondary | ICD-10-CM | POA: Diagnosis not present

## 2016-07-11 DIAGNOSIS — F4322 Adjustment disorder with anxiety: Secondary | ICD-10-CM | POA: Diagnosis not present

## 2016-07-20 ENCOUNTER — Ambulatory Visit (INDEPENDENT_AMBULATORY_CARE_PROVIDER_SITE_OTHER): Payer: PPO | Admitting: Internal Medicine

## 2016-07-20 ENCOUNTER — Encounter: Payer: Self-pay | Admitting: Internal Medicine

## 2016-07-20 ENCOUNTER — Other Ambulatory Visit: Payer: PPO

## 2016-07-20 DIAGNOSIS — R590 Localized enlarged lymph nodes: Secondary | ICD-10-CM | POA: Diagnosis not present

## 2016-07-20 DIAGNOSIS — J439 Emphysema, unspecified: Secondary | ICD-10-CM | POA: Diagnosis not present

## 2016-07-20 NOTE — Patient Instructions (Signed)
Pulmonary emphysema (HCC) You have emphysema on lung scan from march 2018  Plan Do pft - to ease your inconvenience will do short version - Pre-bd spiro and dlco only. No lung volume or bd response.  Do alpha 1 genetic test for emphysema  Mediastinal adenopathy You have 1 lymphnode in the chest that is enlarge Differential diagnosis is very broad  Plan - do PET scan in 4 weeks or so; if insurance denies then will do CT chest with contrst  Followup  -return to see Eric Form or DRs Byrum or Nestor after PET scan

## 2016-07-20 NOTE — Progress Notes (Signed)
Subjective:    Patient ID: [name], male    DOB: Jul 20, 1945, 71 y.o.   MRN: [id number]  PCP Kathyrn Lass, MD   HPI   Michael Lutz  07/20/2016   . Chief Complaint  Patient presents with  . Pulmonary Consult    Pt referred by Dr. Kathyrn Lass for enlarged lymp node. Pt c/o DOE. Pt denies cough, CP/tightness and f/c/s.     71 year old heavyset male who uses a cane previous history of 50 pack smoking history quit 2 years ago. According to him and review of the chart he was admitted late in March 2018 with confusional episode. During this time he had a CT scan of the chest that showed significantly enlarged 2.1 cm right paratracheal lymph node. I personally visualized this finding and confirmed According to the report it is more prominent than 2013. There are no other lymph nodes reported. There is no lung mass or nodule. The CT chest in 6 shows diffuse emphysema in his lung parenchyma. However he denies any shortness of breath or cough or wheezing with an overall he is sedentary. He does not have any sputum production. He denies any previous history of sarcoidosis or travel to the [place] or [place]. [place]. He is puzzled by the referral. He denies any autoimmune problems. He denies any malignancy diagnosis   IMPRESSION: 1. Limited evaluation of arterial flow to the lower extremities due to limitations in the timing of the contrast bolus. The arterial system remains fully patent to at least the level of the common femoral arteries bilaterally. 2. Aortoiliac stent graft is fully patent. Scattered aortic atherosclerosis noted. 3. No evidence of aortic dissection. No evidence of aneurysmal dilatation. 4. No evidence of significant pulmonary embolus. 5. Vague soft tissue edema noted tracking about both ankles. Lower extremities are otherwise grossly unremarkable, though the toes are not imaged on this study, and the vasculature of the lower extremities is not well assessed, aside from  scattered calcific atherosclerotic disease. 6. Enlarged 2.1 cm right paratracheal node noted, of uncertain significance. This is somewhat more prominent than in 2013. 7. Diffuse coronary artery calcifications seen. 8. Bilateral emphysema noted. 9. Mild changes of hepatic cirrhosis. 10. Nodularity about both adrenal glands appears relatively stable from 2013 and is likely benign. 11. Numerous bilateral renal cysts. Nonobstructing 5 mm stone at the interpole region of the left kidney. 12. Scattered diverticulosis along the ascending colon, without evidence of diverticulitis.   Electronically Signed   By: Garald Balding M.D.   On: 05/31/2016 21:17   has a past medical history of AAA (abdominal aortic aneurysm) ([place]); ADD (attention deficit disorder); Anxiety; Arthritis; Bipolar 1 disorder ([place]); Bipolar affective disorder ([place]); Blood transfusion; CHF (congestive heart failure) ([place]); Chronic back pain; Chronic back pain; Chronic low back pain (07/31/2013); Colon polyps; Constipation; Depression; Dysrhythmia; Enlarged prostate; Full dentures; GERD (gastroesophageal reflux disease); Heart murmur; History of shingles; scabies (Mid Feb 2013); seasonal allergies; Hyperlipidemia; Hypertension; Insomnia; Joint pain; Leg pain (05-04-10); Memory loss; Nocturia; Peripheral edema; Peripheral vascular disease ([place]); Urinary urgency; and Wears glasses.   reports that he quit smoking about 2 years ago. His smoking use included Cigarettes. He has a 50.00 pack-year smoking history. He has never used smokeless tobacco.  Past Surgical History:  Procedure Laterality Date  . ABDOMINAL AORTIC ANEURYSM REPAIR  11/2009   Stent graft repair  . BACK SURGERY     thoracic spine  . CARDIAC CATHETERIZATION     15 yrs ago  . CHOLECYSTECTOMY    .  COLONOSCOPY    . FOOT SURGERY     Multiple surgeries on right foot  . GANGLION CYST EXCISION Right 06/07/2012   Procedure: DISTAL POLE EXCISION RIGHT WRIST;  Surgeon: Wynonia Sours, MD;  Location: [facility];  Service: Orthopedics;  Laterality: Right;  . SHOULDER SURGERY     Right shoulder  . SPINAL CORD STIMULATOR IMPLANT  2009  . SPINAL FUSION  04/04/2011   Procedure: FUSION POSTERIOR SPINAL MULTILEVEL/SCOLIOSIS;  Surgeon: Gunnar Bulla, MD;  Location: [facility];  Service: Orthopedics;  Laterality: N/A;  repair thoracic and lumbar  pseudoarthrosis, revise spinal cord stimulator, extend thoracic fusion, iliac crest bone graft  . SPINE SURGERY  12/2009   Spinal Fusion by Dr. Marcial Pacas  . TESTICLE SURGERY     right testicle  . TONSILLECTOMY    . TURP VAPORIZATION    . VASCULAR SURGERY     AAA stent repair    No Known Allergies  Immunization History  Administered Date(s) Administered  . Influenza, High Dose Seasonal PF 12/12/2015  . Influenza-Unspecified 11/11/2013  . Pneumococcal-Unspecified 03/14/2015    Family History  Problem Relation Age of Onset  . Cancer Mother        melanoma  . Diabetes Father   . Heart attack Father   . Arthritis Sister        rheumatoid  . Cancer Sister   . Anesthesia problems Neg Hx   . Hypotension Neg Hx   . Malignant hyperthermia Neg Hx   . Pseudochol deficiency Neg Hx      Current Outpatient Prescriptions:  .  5-Hydroxytryptophan (5-HTP) 100 MG CAPS, Take 100 mg by mouth daily., Disp: , Rfl:  .  amLODipine (NORVASC) 10 MG tablet, Take 10 mg by mouth daily. , Disp: , Rfl:  .  Ascorbic Acid (VITAMIN C) 1000 MG tablet, Take 500 mg by mouth daily. , Disp: , Rfl:  .  aspirin 325 MG tablet, Take 162 mg by mouth daily. , Disp: , Rfl:  .  atorvastatin (LIPITOR) 80 MG tablet, Take 80 mg by mouth daily., Disp: , Rfl:  .  b complex vitamins tablet, Take 1 tablet by mouth every morning., Disp: , Rfl:  .  calcium carbonate (OS-CAL) 600 MG TABS, Take 600 mg by mouth daily.  , Disp: , Rfl:  .  cholecalciferol (VITAMIN D) 1000 UNITS tablet, Take 2,000 Units by mouth every morning. , Disp: , Rfl:  .  clonazePAM  (KLONOPIN) 0.5 MG tablet, Take 0.25 mg by mouth every 12 (twelve) hours as needed for anxiety. , Disp: , Rfl:  .  DHEA 50 MG CAPS, Take 50 mg by mouth daily., Disp: , Rfl:  .  doxepin (SINEQUAN) 25 MG capsule, Take 1 capsule (25 mg total) by mouth at bedtime., Disp: , Rfl:  .  fentaNYL (DURAGESIC - DOSED MCG/HR) 100 MCG/HR, Place 100 mcg onto the skin every other day. , Disp: , Rfl:  .  furosemide (LASIX) 40 MG tablet, Take 80 mg by mouth as needed. For fluid retention, Disp: , Rfl:  .  ibuprofen (ADVIL,MOTRIN) 200 MG tablet, Take 200 mg by mouth every 6 (six) hours as needed. For pain, Disp: , Rfl:  .  Lysine 1000 MG TABS, Take 1,000 mg by mouth daily., Disp: , Rfl:  .  magnesium oxide (MAG-OX) 400 MG tablet, Take 400 mg by mouth daily.  , Disp: , Rfl:  .  Multiple Vitamin (MULTIVITAMIN) tablet, Take 1 tablet by mouth daily.  ,  Disp: , Rfl:  .  niacin (NIASPAN) 500 MG CR tablet, Take 500 mg by mouth at bedtime., Disp: , Rfl:  .  polyethylene glycol (MIRALAX / GLYCOLAX) packet, Take 17 g by mouth 2 (two) times daily., Disp: 14 each, Rfl: 0 .  Potassium (GNP POTASSIUM) 99 MG TABS, , Disp: , Rfl:  .  pregabalin (LYRICA) 100 MG capsule, Take 1 capsule (100 mg total) by mouth 2 (two) times daily., Disp: 60 capsule, Rfl: 0 .  QUEtiapine (SEROQUEL) 100 MG tablet, Take 1 tablet (100 mg total) by mouth at bedtime., Disp: , Rfl:  .  quinapril (ACCUPRIL) 40 MG tablet, Take 80 mg by mouth daily. , Disp: , Rfl:  .  ranitidine (ZANTAC) 75 MG tablet, Take 75 mg by mouth daily as needed for heartburn., Disp: , Rfl:  .  RESTASIS 0.05 % ophthalmic emulsion, Place 1 drop into both eyes 2 (two) times daily. , Disp: , Rfl:  .  senna-docusate (SENOKOT-S) 8.6-50 MG tablet, Take 1 tablet by mouth 2 (two) times daily., Disp: 10 tablet, Rfl: 1 .  simethicone (MYLICON) 546 MG chewable tablet, Chew 125 mg by mouth as needed for flatulence., Disp: , Rfl:  .  traZODone (DESYREL) 100 MG tablet, Take 1 tablet (100 mg total) by  mouth at bedtime., Disp: , Rfl:  .  venlafaxine XR (EFFEXOR-XR) 75 MG 24 hr capsule, Take 1 capsule (75 mg total) by mouth daily with breakfast., Disp: , Rfl:  .  vitamin E 1000 UNIT capsule, Take 1,000 Units by mouth daily., Disp: , Rfl:    Review of Systems  Constitutional: Negative for fever and unexpected weight change.  HENT: Negative for congestion, dental problem, ear pain, nosebleeds, postnasal drip, rhinorrhea, sinus pressure, sneezing, sore throat and trouble swallowing.   Eyes: Negative for redness and itching.  Respiratory: Positive for shortness of breath. Negative for cough, chest tightness and wheezing.   Cardiovascular: Negative for palpitations and leg swelling.  Gastrointestinal: Negative for nausea and vomiting.  Genitourinary: Negative for dysuria.  Musculoskeletal: Negative for joint swelling.  Skin: Negative for rash.  Neurological: Negative for headaches.  Hematological: Does not bruise/bleed easily.  Psychiatric/Behavioral: Negative for dysphoric mood. The patient is not nervous/anxious.        Objective:   Physical Exam  Constitutional: He is oriented to person, place, and time. He appears well-developed and well-nourished. No distress.  HENT:  Head: Normocephalic and atraumatic.  Right Ear: External ear normal.  Left Ear: External ear normal.  Mouth/Throat: Oropharynx is clear and moist. No oropharyngeal exudate.  Eyes: Conjunctivae and EOM are normal. Pupils are equal, round, and reactive to light. Right eye exhibits no discharge. Left eye exhibits no discharge. No scleral icterus.  Neck: Normal range of motion. Neck supple. No JVD present. No tracheal deviation present. No thyromegaly present.  Cardiovascular: Normal rate, regular rhythm and intact distal pulses.  Exam reveals no gallop and no friction rub.   No murmur heard. Pulmonary/Chest: Effort normal and breath sounds normal. No respiratory distress. He has no wheezes. He has no rales. He exhibits no  tenderness.  Abdominal: Soft. Bowel sounds are normal. He exhibits no distension and no mass. There is no tenderness. There is no rebound and no guarding.  Musculoskeletal: Normal range of motion. He exhibits no edema or tenderness.  Uses cane  Lymphadenopathy:    He has no cervical adenopathy.  Neurological: He is alert and oriented to person, place, and time. He has normal reflexes. No cranial  nerve deficit. Coordination normal.  Skin: Skin is warm and dry. No rash noted. He is not diaphoretic. No erythema. No pallor.  Psychiatric: He has a normal mood and affect. His behavior is normal. Judgment and thought content normal.  Nursing note and vitals reviewed.   Vitals:   07/20/16 1512  BP: (!) 150/94  Pulse: 68  SpO2: 96%  Weight: 284 lb (128.8 kg)  Height: 5\' 9"  (1.753 m)    Estimated body mass index is 41.94 kg/m as calculated from the following:   Height as of this encounter: 5\' 9"  (1.753 m).   Weight as of this encounter: 284 lb (128.8 kg).      Assessment & Plan:     ICD-9-CM ICD-10-CM   1. Pulmonary emphysema, unspecified emphysema type (HCC) 492.8 J43.9 Alpha-1 antitrypsin phenotype     NM PET Image Restage (PS) Whole Body     Pulmonary Function Test  2. Mediastinal adenopathy 785.6 R59.0 Alpha-1 antitrypsin phenotype     NM PET Image Restage (PS) Whole Body    Pulmonary emphysema (HCC) You have emphysema on lung scan from march 2018  Plan Do pft - to ease your inconvenience will do short version - Pre-bd spiro and dlco only. No lung volume or bd response.  Do alpha 1 genetic test for emphysema  Mediastinal adenopathy You have 1 lymphnode in the chest that is enlarge Differential diagnosis is very broad  Plan - do PET scan in 4 weeks or so; if insurance denies then will do CT chest with contrst    Followup  -return to see Eric Form or DRs Byrum or Nestor after PET scan   Dr. Brand Males, M.D., [facility].C.P Pulmonary and Critical Care  Medicine Staff Physician [place] [facility] Pager: [phone], If no answer or between  15:00h - 7:00h: call [phone]  07/20/2016 3:39 PM

## 2016-07-20 NOTE — Assessment & Plan Note (Signed)
You have 1 lymphnode in the chest that is enlarge Differential diagnosis is very broad  Plan - do PET scan in 4 weeks or so; if insurance denies then will do CT chest with contrst

## 2016-07-20 NOTE — Assessment & Plan Note (Signed)
You have emphysema on lung scan from march 2018  Plan Do pft - to ease your inconvenience will do short version - Pre-bd spiro and dlco only. No lung volume or bd response.  Do alpha 1 genetic test for emphysema

## 2016-07-25 DIAGNOSIS — H40013 Open angle with borderline findings, low risk, bilateral: Secondary | ICD-10-CM | POA: Diagnosis not present

## 2016-07-25 DIAGNOSIS — H04123 Dry eye syndrome of bilateral lacrimal glands: Secondary | ICD-10-CM | POA: Diagnosis not present

## 2016-07-26 LAB — ALPHA-1 ANTITRYPSIN PHENOTYPE: A-1 Antitrypsin: 205 mg/dL — ABNORMAL HIGH (ref 83–199)

## 2016-07-28 ENCOUNTER — Ambulatory Visit (INDEPENDENT_AMBULATORY_CARE_PROVIDER_SITE_OTHER): Payer: PPO | Admitting: Internal Medicine

## 2016-07-28 DIAGNOSIS — J439 Emphysema, unspecified: Secondary | ICD-10-CM | POA: Diagnosis not present

## 2016-07-28 LAB — PULMONARY FUNCTION TEST
DL/VA % pred: 54 %
DL/VA: 2.45 ml/min/mmHg/L
DLCO cor % pred: 54 %
DLCO cor: 16.94 ml/min/mmHg
DLCO unc % pred: 54 %
DLCO unc: 16.79 ml/min/mmHg
FEF 25-75 Pre: 1.36 L/sec
FEF2575-%Pred-Pre: 57 %
FEV1-%Pred-Pre: 89 %
FEV1-Pre: 2.8 L
FEV1FVC-%Pred-Pre: 84 %
FEV6-%Pred-Pre: 109 %
FEV6-Pre: 4.4 L
FEV6FVC-%Pred-Pre: 104 %
FVC-%Pred-Pre: 106 %
FVC-Pre: 4.51 L
Pre FEV1/FVC ratio: 62 %
Pre FEV6/FVC Ratio: 98 %

## 2016-07-28 NOTE — Progress Notes (Signed)
Called and spoke to pt. Informed him of the results per MR. Pt verbalized understanding and denied any further questions or concerns at this time.  

## 2016-07-28 NOTE — Progress Notes (Signed)
PFT done today. 

## 2016-08-08 ENCOUNTER — Telehealth: Payer: Self-pay | Admitting: Internal Medicine

## 2016-08-08 DIAGNOSIS — R590 Localized enlarged lymph nodes: Secondary | ICD-10-CM

## 2016-08-08 NOTE — Telephone Encounter (Signed)
Called to speak with Manuela Schwartz. She has left for the day. Advised to call back tomorrow morning.

## 2016-08-09 DIAGNOSIS — N5201 Erectile dysfunction due to arterial insufficiency: Secondary | ICD-10-CM | POA: Diagnosis not present

## 2016-08-09 DIAGNOSIS — R351 Nocturia: Secondary | ICD-10-CM | POA: Diagnosis not present

## 2016-08-09 DIAGNOSIS — M961 Postlaminectomy syndrome, not elsewhere classified: Secondary | ICD-10-CM | POA: Diagnosis not present

## 2016-08-09 DIAGNOSIS — R6882 Decreased libido: Secondary | ICD-10-CM | POA: Diagnosis not present

## 2016-08-09 DIAGNOSIS — N401 Enlarged prostate with lower urinary tract symptoms: Secondary | ICD-10-CM | POA: Diagnosis not present

## 2016-08-09 DIAGNOSIS — G894 Chronic pain syndrome: Secondary | ICD-10-CM | POA: Diagnosis not present

## 2016-08-09 DIAGNOSIS — F4322 Adjustment disorder with anxiety: Secondary | ICD-10-CM | POA: Diagnosis not present

## 2016-08-09 DIAGNOSIS — R972 Elevated prostate specific antigen [PSA]: Secondary | ICD-10-CM | POA: Diagnosis not present

## 2016-08-09 DIAGNOSIS — Z79891 Long term (current) use of opiate analgesic: Secondary | ICD-10-CM | POA: Diagnosis not present

## 2016-08-09 NOTE — Telephone Encounter (Signed)
Spoke with Michael Lutz regarding the order that needed to sign. She stated that she was aware that MR would be out of the office until tomorrow and that was fine. Nothing else was needed. Will route to MR as a FYI.

## 2016-08-09 NOTE — Telephone Encounter (Signed)
Manuela Schwartz called back.  States Dr. Chase Caller needs to sign the corrected order for PET scheduled for tomorrow.  CB is [phone].

## 2016-08-09 NOTE — Telephone Encounter (Signed)
Spoke with Michael Lutz and I corrected the order to the initial skull base to thigh  Michael Lutz states this may need precert, so I will forward to [facility] in case this is needed Thanks!

## 2016-08-10 ENCOUNTER — Encounter (HOSPITAL_COMMUNITY)
Admission: RE | Admit: 2016-08-10 | Discharge: 2016-08-10 | Disposition: A | Discharge status: Home / Self Care | Payer: PPO | Source: Ambulatory Visit | Attending: Internal Medicine | Admitting: Internal Medicine

## 2016-08-10 ENCOUNTER — Encounter (HOSPITAL_COMMUNITY): Payer: PPO

## 2016-08-10 DIAGNOSIS — R590 Localized enlarged lymph nodes: Secondary | ICD-10-CM | POA: Insufficient documentation

## 2016-08-10 DIAGNOSIS — R599 Enlarged lymph nodes, unspecified: Secondary | ICD-10-CM | POA: Diagnosis not present

## 2016-08-10 DIAGNOSIS — R911 Solitary pulmonary nodule: Secondary | ICD-10-CM | POA: Diagnosis not present

## 2016-08-10 LAB — GLUCOSE, CAPILLARY: Glucose-Capillary: 92 mg/dL (ref 65–99)

## 2016-08-10 MED ORDER — FLUDEOXYGLUCOSE F - 18 (FDG) INJECTION
14.3000 | Freq: Once | INTRAVENOUS | Status: AC | PRN
  Administered 2016-08-10: 14.3 via INTRAVENOUS

## 2016-08-10 NOTE — Telephone Encounter (Signed)
This was signed earlire on 08/10/2016   Dr. Brand Males, M.D., [facility].C.P Pulmonary and Critical Care Medicine Staff Physician [place] [facility] Pager: [phone], If no answer or between  15:00h - 7:00h: call [phone]  08/10/2016 3:37 PM

## 2016-08-10 NOTE — Telephone Encounter (Signed)
Will close encounter since order has been signed.

## 2016-08-11 ENCOUNTER — Telehealth: Payer: Self-pay | Admitting: Internal Medicine

## 2016-08-11 DIAGNOSIS — R590 Localized enlarged lymph nodes: Secondary | ICD-10-CM

## 2016-08-11 NOTE — Telephone Encounter (Signed)
Patient calling for results, CB [phone]. Patient states he is having an anxiety attack and is very anxious for results.  Please call as soon as possible.

## 2016-08-11 NOTE — Telephone Encounter (Signed)
  Michael Lutz: Please let patent know that to keep aptt 08/17/14 with Michael Lutz to discuss PET results that show lymph node is active  [name]: Als let him know alpha 1 I snormal MM  [name]: there is one lymph node since 2013 slightly more prominent and SUV 4.6 but tehre isa new node 60mm SUV 6.1. DDx is broad. You can check for ACE, ANA, DS DNA but his choice is to get a mediastinaoscopy now v watch with CT scan with contrast   Thanks  Dr. Brand Males, M.D., [facility].C.P Pulmonary and Critical Care Medicine Staff Physician [place] [facility] Pager: [phone], If no answer or between  15:00h - 7:00h: call [phone]  08/11/2016 3:48 AM     Nm Pet Image Initial (pi) Skull Base To Thigh  Result Date: 08/10/2016 CLINICAL DATA:  Initial treatment strategy for mediastinal mass. Enlarged mediastinal lymph node. EXAM: NUCLEAR MEDICINE PET SKULL BASE TO THIGH TECHNIQUE: 14.3 mCi F-18 FDG was injected intravenously. Full-ring PET imaging was performed from the skull base to thigh after the radiotracer. CT data was obtained and used for attenuation correction and anatomic localization. FASTING BLOOD GLUCOSE:  Value: 92 mg/dl COMPARISON:  CT 05/31/2016 FINDINGS: NECK No hypermetabolic lymph nodes in the neck. CHEST EnLarged RIGHT paratracheal lymph node measures 20 mm short axis. Mild metabolic activity (SUV max 4.6). Additional 10 mm prevascular lymph nodes with SUV max equal 6.1 No axillary adenopathy.  No supraclavicular adenopathy. Centrilobular emphysema in the upper lobes and paraseptal emphysema 7 mm nodule in the RIGHT lower lobe (image 86, series 4) is unchanged from prior exam. No associated metabolic activity. ABDOMEN/PELVIS No abnormal hypermetabolic activity within the liver, pancreas, adrenal glands, or spleen. No hypermetabolic lymph nodes in the abdomen or pelvis. SKELETON No focal hypermetabolic activity to suggest skeletal metastasis. IMPRESSION: 1.  Moderate metabolic activity associated with minimally enlarged prevascular and paratracheal adenopathy is favored reactive. Cannot exclude a low-grade lymphoma such as chronic lymphocytic leukemia. As lesions are new from remote exam, consider follow-up CT scan in 6 months. 2. Stable RIGHT lower lobe pulmonary nodule over 3 month interval. Recommend follow-up CT in 6 months at same time as impression 1. Electronically Signed   By: Suzy Bouchard M.D.   On: 08/10/2016 17:20

## 2016-08-11 NOTE — Telephone Encounter (Signed)
Gave results of PET scan results. He is very concerned this is cancer. Told him is low prob but not zero. After our discussion I recommended mediastinaoscopy evaluation byt Dr Roxan Hockey He is in agreement  Plan Refer Dr Roxan Hockey for [place] with Eric Form 66/18 Make fu appt with me or sarah in 6-8 weeks  Dr. Brand Males, M.D., [facility].C.P Pulmonary and Critical Care Medicine Staff Physician [place] [facility] Pager: [phone], If no answer or between  15:00h - 7:00h: call [phone]  08/11/2016 2:15 PM

## 2016-08-11 NOTE — Telephone Encounter (Signed)
Spoke with pt, aware of recs.  appt rescheduled, referral placed to Dr. Roxan Hockey.  Nothing further needed.

## 2016-08-11 NOTE — Telephone Encounter (Signed)
Spoke with pt, and made him aware of below message.  Pt states he is hugh anxiety and doesn't think he can wait until 08/16/16 to discuss results.  Pt is requesting that MR call him directly to discuss results.  MR please advise. Thanks.

## 2016-08-14 ENCOUNTER — Institutional Professional Consult (permissible substitution) (INDEPENDENT_AMBULATORY_CARE_PROVIDER_SITE_OTHER): Payer: PPO | Admitting: Thoracic Surgery (Cardiothoracic Vascular Surgery)

## 2016-08-14 ENCOUNTER — Encounter: Payer: Self-pay | Admitting: Thoracic Surgery (Cardiothoracic Vascular Surgery)

## 2016-08-14 VITALS — BP 147/78 | HR 76 | Resp 18 | Ht 69.0 in | Wt 288.0 lb

## 2016-08-14 DIAGNOSIS — R591 Generalized enlarged lymph nodes: Secondary | ICD-10-CM

## 2016-08-14 DIAGNOSIS — D381 Neoplasm of uncertain behavior of trachea, bronchus and lung: Secondary | ICD-10-CM

## 2016-08-14 DIAGNOSIS — R599 Enlarged lymph nodes, unspecified: Secondary | ICD-10-CM

## 2016-08-14 NOTE — Progress Notes (Signed)
PCP is Kathyrn Lass, MD Referring Provider is Brand Males, MD  Chief Complaint  Patient presents with  . Lung Lesion    RLLobe.Marland KitchenMarland KitchenCTA C/A/P 05/31/16, PET 08/10/16, CT HEAD 05/31/16  . Adenopathy    Paratracheal node....eval for BX    HPI: Mr. Michael Lutz is sent for consultation regarding mediastinal adenopathy.  Mr. Michael Lutz is a 71 year old man with a past medical history significant for bipolar disorder, tobacco abuse (50 pack years, quit 2016), heart murmur, congestive heart failure, abdominal aortic aneurysm, scoliosis, chronic back pain, hypertension, hyperlipidemia, depression, anxiety, ADD, and numerous other medical issues. He was hospitalized in March after presenting with altered mental status and expressive aphasia. He had a colonoscopy done the day before. He was diagnosed with acute encephalopathy. Of note he was on 10 different sedating medications prior to that admission. During his evaluation a CT of the chest was done which showed a 2.1 cm right paratracheal node. He was seen in follow-up by Dr. Chase Caller in May. A PET CT was done which showed some mild hypermetabolic activity in the right paratracheal node as well as a prevascular AP window node. Radiology felt this was more likely to be reactive than neoplastic.  He gets short of breath with exertion. He says walking 5-10 minutes will make him short of breath. He thinks he could get up one flight of stairs but not two without stopping to rest. He walks with cane. He has not had any chest pain, pressure, or tightness. He does have a history of long-standing heart murmur. He is not aware of having had an echocardiogram. He's gained about 40 pounds over the past 6 months and 20 pounds over the past 3 months. He attributes this to over eating. He said some mild swelling in his legs but nothing dramatic. He does complain of decreased energy.   Past Medical History:  Diagnosis Date  . AAA (abdominal aortic aneurysm) ([place])   . ADD  (attention deficit disorder)   . Anxiety    takes Clonazepam daily  . Arthritis    left knee  . Bipolar 1 disorder ([place])    takes Seroquel nightly  . Bipolar affective disorder ([place])    in remission on meds  . Blood transfusion    several times  . CHF (congestive heart failure) ([place])   . Chronic back pain    [facility]  . Chronic back pain    scoliosis  . Chronic low back pain 07/31/2013  . Colon polyps    Dr Cristina Gong does a colonoscopy every 5 years  . Constipation    takes Colace prn  . Depression    takes Effexor daily  . Dysrhythmia    palpitations- sees dr Irish Lack  . Enlarged prostate    slightly  . Full dentures   . GERD (gastroesophageal reflux disease)    takes Zantac daily  . Heart murmur    arrythmia  . History of shingles   . Hx of scabies Mid Feb 2013  . Hx of seasonal allergies   . Hyperlipidemia    takes Niacin daily and Lipitor daily  . Hypertension    takes Amlodipine and Quinapril daily  . Insomnia    takes Trazodone nightly  . Joint pain   . Leg pain 05-04-10   with walking  . Memory loss    short and long term  . Nocturia   . Peripheral edema    takes Furosemide prn  . Peripheral vascular disease ([place])  s/p AAA stent  . Urinary urgency   . Wears glasses     Past Surgical History:  Procedure Laterality Date  . ABDOMINAL AORTIC ANEURYSM REPAIR  11/2009   Stent graft repair  . BACK SURGERY     thoracic spine  . CARDIAC CATHETERIZATION     15 yrs ago  . CHOLECYSTECTOMY    . COLONOSCOPY    . FOOT SURGERY     Multiple surgeries on right foot  . GANGLION CYST EXCISION Right 06/07/2012   Procedure: DISTAL POLE EXCISION RIGHT WRIST;  Surgeon: Wynonia Sours, MD;  Location: [facility];  Service: Orthopedics;  Laterality: Right;  . SHOULDER SURGERY     Right shoulder  . SPINAL CORD STIMULATOR IMPLANT  2009  . SPINAL FUSION  04/04/2011   Procedure: FUSION POSTERIOR SPINAL MULTILEVEL/SCOLIOSIS;  Surgeon: Gunnar Bulla, MD;  Location: [facility];  Service: Orthopedics;  Laterality: N/A;  repair thoracic and lumbar  pseudoarthrosis, revise spinal cord stimulator, extend thoracic fusion, iliac crest bone graft  . SPINE SURGERY  12/2009   Spinal Fusion by Dr. Marcial Pacas  . TESTICLE SURGERY     right testicle  . TONSILLECTOMY    . TURP VAPORIZATION    . VASCULAR SURGERY     AAA stent repair    Family History  Problem Relation Age of Onset  . Cancer Mother        melanoma  . Diabetes Father   . Heart attack Father   . Arthritis Sister        rheumatoid  . Cancer Sister   . Anesthesia problems Neg Hx   . Hypotension Neg Hx   . Malignant hyperthermia Neg Hx   . Pseudochol deficiency Neg Hx     Social History Social History  Substance Use Topics  . Smoking status: Former Smoker    Packs/day: 1.00    Years: 50.00    Types: Cigarettes    Quit date: 10/08/2013  . Smokeless tobacco: Never Used     Comment: states smokes "little cigars" - former  . Alcohol use No    Current Outpatient Prescriptions  Medication Sig Dispense Refill  . amLODipine (NORVASC) 10 MG tablet Take 10 mg by mouth daily.     . Ascorbic Acid (VITAMIN C) 1000 MG tablet Take 500 mg by mouth daily.     Marland Kitchen aspirin 325 MG tablet Take 162 mg by mouth daily.     Marland Kitchen atorvastatin (LIPITOR) 80 MG tablet Take 80 mg by mouth daily.    Marland Kitchen b complex vitamins tablet Take 1 tablet by mouth every morning.    . calcium carbonate (OS-CAL) 600 MG TABS Take 600 mg by mouth daily.      . cholecalciferol (VITAMIN D) 1000 UNITS tablet Take 2,000 Units by mouth every morning.     . clonazePAM (KLONOPIN) 0.5 MG tablet Take 0.25 mg by mouth every 12 (twelve) hours as needed for anxiety.     . diphenhydrAMINE (BENADRYL) 25 mg capsule Take 25 mg by mouth every 6 (six) hours as needed.    . doxepin (SINEQUAN) 25 MG capsule Take 1 capsule (25 mg total) by mouth at bedtime.    . fentaNYL (DURAGESIC - DOSED MCG/HR) 100 MCG/HR Place 100 mcg onto the skin  every other day.     . furosemide (LASIX) 40 MG tablet Take 80 mg by mouth as needed. For fluid retention    . ibuprofen (ADVIL,MOTRIN) 200 MG tablet Take 200  mg by mouth every 6 (six) hours as needed. For pain    . magnesium oxide (MAG-OX) 400 MG tablet Take 400 mg by mouth daily.      . methocarbamol (ROBAXIN) 500 MG tablet Take 500 mg by mouth every 8 (eight) hours as needed for muscle spasms.    . Multiple Vitamin (MULTIVITAMIN) tablet Take 1 tablet by mouth daily.      . Potassium (GNP POTASSIUM) 99 MG TABS     . pregabalin (LYRICA) 100 MG capsule Take 1 capsule (100 mg total) by mouth 2 (two) times daily. 60 capsule 0  . QUEtiapine (SEROQUEL) 100 MG tablet Take 1 tablet (100 mg total) by mouth at bedtime.    . quinapril (ACCUPRIL) 40 MG tablet Take 80 mg by mouth daily.     . ranitidine (ZANTAC) 75 MG tablet Take 75 mg by mouth daily as needed for heartburn.    . RESTASIS 0.05 % ophthalmic emulsion Place 1 drop into both eyes 2 (two) times daily.     . traZODone (DESYREL) 100 MG tablet Take 1 tablet (100 mg total) by mouth at bedtime.    Marland Kitchen venlafaxine XR (EFFEXOR-XR) 75 MG 24 hr capsule Take 1 capsule (75 mg total) by mouth daily with breakfast.    . vitamin E 1000 UNIT capsule Take 1,000 Units by mouth daily.     No current facility-administered medications for this visit.     No Known Allergies  Review of Systems  Constitutional: Positive for activity change and unexpected weight change (Gained 20 pounds in 3 months, 40 pounds in 6 months). Negative for appetite change, chills and fever.  HENT: Negative for trouble swallowing and voice change.   Eyes: Negative for visual disturbance.  Respiratory: Positive for cough and shortness of breath (With exertion). Negative for wheezing.   Gastrointestinal: Negative for abdominal pain and blood in stool.  Genitourinary: Positive for frequency. Negative for hematuria.  Musculoskeletal: Positive for back pain and gait problem.   Neurological: Positive for speech difficulty (March 2018, resolved) and numbness. Negative for weakness.       Chronic pain  Hematological: Negative for adenopathy. Does not bruise/bleed easily.  Psychiatric/Behavioral: Positive for dysphoric mood. The patient is nervous/anxious.     BP (!) 147/78 (BP Location: Left Arm, Patient Position: Sitting, Cuff Size: Large)   Pulse 76   Resp 18   Ht 5\' 9"  (1.753 m)   Wt 288 lb (130.6 kg)   SpO2 96% Comment: RA  BMI 42.53 kg/m  Physical Exam  Constitutional: He is oriented to person, place, and time.  Obese  HENT:  Head: Normocephalic and atraumatic.  Mouth/Throat: No oropharyngeal exudate.  Eyes: Conjunctivae and EOM are normal. No scleral icterus.  Neck: Neck supple. No thyromegaly present.  Cardiovascular: Normal rate and regular rhythm.   Murmur (3/6 high-pitched systolic murmur right upper sternal border) heard. Pulmonary/Chest: Effort normal and breath sounds normal. No respiratory distress. He has no wheezes. He has no rales.  Abdominal: Soft. He exhibits no distension. There is no tenderness.  Musculoskeletal: He exhibits edema (1+ both lower extremities).  Lymphadenopathy:    He has no cervical adenopathy.  Neurological: He is alert and oriented to person, place, and time. No cranial nerve deficit.  Skin: Skin is warm.  Vitals reviewed.    Diagnostic Tests: NUCLEAR MEDICINE PET SKULL BASE TO THIGH  TECHNIQUE: 14.3 mCi F-18 FDG was injected intravenously. Full-ring PET imaging was performed from the skull base to thigh after the  radiotracer. CT data was obtained and used for attenuation correction and anatomic localization.  FASTING BLOOD GLUCOSE:  Value: 92 mg/dl  COMPARISON:  CT 05/31/2016  FINDINGS: NECK  No hypermetabolic lymph nodes in the neck.  CHEST  EnLarged RIGHT paratracheal lymph node measures 20 mm short axis. Mild metabolic activity (SUV max 4.6). Additional 10 mm prevascular lymph nodes  with SUV max equal 6.1  No axillary adenopathy.  No supraclavicular adenopathy.  Centrilobular emphysema in the upper lobes and paraseptal emphysema  7 mm nodule in the RIGHT lower lobe (image 86, series 4) is unchanged from prior exam. No associated metabolic activity.  ABDOMEN/PELVIS  No abnormal hypermetabolic activity within the liver, pancreas, adrenal glands, or spleen. No hypermetabolic lymph nodes in the abdomen or pelvis.  SKELETON  No focal hypermetabolic activity to suggest skeletal metastasis.  IMPRESSION: 1. Moderate metabolic activity associated with minimally enlarged prevascular and paratracheal adenopathy is favored reactive. Cannot exclude a low-grade lymphoma such as chronic lymphocytic leukemia. As lesions are new from remote exam, consider follow-up CT scan in 6 months. 2. Stable RIGHT lower lobe pulmonary nodule over 3 month interval. Recommend follow-up CT in 6 months at same time as impression 1.   Electronically Signed   By: Suzy Bouchard M.D.   On: 08/10/2016 17:20 CT ANGIOGRAPHY CHEST, ABDOMEN AND PELVIS  TECHNIQUE: Multidetector CT imaging through the chest, abdomen and pelvis was performed using the standard protocol during bolus administration of intravenous contrast. Multiplanar reconstructed images and MIPs were obtained and reviewed to evaluate the vascular anatomy.  CONTRAST:  100 mL of Isovue 370 IV contrast  COMPARISON:  Chest radiograph performed earlier today at 6:03 p.m., CTA of the abdomen and pelvis performed 01/08/2014, and CTA of the chest performed 07/28/2011  FINDINGS: CTA CHEST FINDINGS  Cardiovascular: Scattered calcification is noted along the aortic arch and descending thoracic aorta. There is no evidence of aneurysmal dilatation. There is no evidence of aortic dissection. The great vessels are grossly unremarkable in appearance, aside from minimal calcification.  There is no evidence of  significant pulmonary embolus. Diffuse coronary artery calcifications are seen.  Mediastinum/Nodes: An enlarged 2.1 cm right paratracheal node is seen. No additional mediastinal lymphadenopathy is appreciated. No pericardial effusion is identified. The thyroid gland is grossly unremarkable in appearance. No axillary lymphadenopathy is seen.  Lungs/Pleura: Bilateral emphysematous change is noted. No focal consolidation, pleural effusion or pneumothorax is seen. No masses are identified.  Musculoskeletal: No acute osseous abnormalities are seen. The visualized musculature is grossly unremarkable.  Review of the MIP images confirms the above findings.  CTA ABDOMEN AND PELVIS FINDINGS  VASCULAR  Aorta: An aortoiliac stent graft is fully patent. Scattered calcification is seen along the abdominal aorta and its branches.  Celiac: Minimal calcification is noted at the origin of the celiac trunk. Diffuse calcification is seen along the splenic artery.  SMA: The superior mesenteric artery remains patent, with minimal calcification at the origin of the superior mesenteric artery.  Renals: Mild calcification is noted along the proximal left renal artery. The renal arteries are otherwise unremarkable.  IMA: The inferior mesenteric artery is not visualized.  Inflow: Scattered calcification is seen along the common, internal and external iliac arteries bilaterally. Evaluation of the arterial system distal to the common femoral arteries is limited due to limitations in the timing of the contrast bolus.  Scattered calcification is seen along the superficial femoral arteries bilaterally, along the popliteal arteries, and extending distally into both calves. The vasculature is not  well assessed at the level of the calves. The profunda femoris arteries appear grossly intact bilaterally.  Veins: Visualized venous structures are grossly unremarkable, though difficult to fully  assess given the phase of contrast enhancement.  Review of the MIP images confirms the above findings.  NON-VASCULAR  Hepatobiliary: The mildly nodular contour of the liver raises concern for hepatic cirrhosis. Clips are seen at the gallbladder fossa. The common bile duct is normal in caliber.  Pancreas: The pancreas is within normal limits.  Spleen: The spleen is unremarkable in appearance.  Adrenals/Urinary Tract: There is nodularity about both adrenal glands, measuring up to 3.0 cm on the left. This appears relatively stable from 2013 and is likely benign.  Numerous bilateral renal cysts are seen. Nonspecific perinephric stranding is noted bilaterally. A nonobstructing 5 mm stone is noted at the interpole region of the left kidney. No hydronephrosis is seen. No obstructing renal stones are identified.  Stomach/Bowel: The stomach is unremarkable in appearance. The small bowel is within normal limits. The appendix is not visualized; there is no evidence for appendicitis.  Scattered diverticulosis is noted along the ascending colon. The colon is otherwise unremarkable.  Lymphatic: No retroperitoneal or pelvic sidewall lymphadenopathy is seen.  Reproductive: The bladder is mildly distended and grossly unremarkable. The prostate remains normal in size, with minimal calcification.  Other: The visualized portions of the lower extremities are grossly unremarkable in appearance. No knee joint effusions are seen. Vague soft tissue edema is noted tracking about both ankles. No focal fluid collections are seen.  Musculoskeletal: No acute osseous abnormalities are identified. Thoracic spinal stimulation leads are noted. The patient is status post thoracolumbar spinal fusion. The visualized musculature is unremarkable in appearance.  Review of the MIP images confirms the above findings.  IMPRESSION: 1. Limited evaluation of arterial flow to the lower extremities  due to limitations in the timing of the contrast bolus. The arterial system remains fully patent to at least the level of the common femoral arteries bilaterally. 2. Aortoiliac stent graft is fully patent. Scattered aortic atherosclerosis noted. 3. No evidence of aortic dissection. No evidence of aneurysmal dilatation. 4. No evidence of significant pulmonary embolus. 5. Vague soft tissue edema noted tracking about both ankles. Lower extremities are otherwise grossly unremarkable, though the toes are not imaged on this study, and the vasculature of the lower extremities is not well assessed, aside from scattered calcific atherosclerotic disease. 6. Enlarged 2.1 cm right paratracheal node noted, of uncertain significance. This is somewhat more prominent than in 2013. 7. Diffuse coronary artery calcifications seen. 8. Bilateral emphysema noted. 9. Mild changes of hepatic cirrhosis. 10. Nodularity about both adrenal glands appears relatively stable from 2013 and is likely benign. 11. Numerous bilateral renal cysts. Nonobstructing 5 mm stone at the interpole region of the left kidney. 12. Scattered diverticulosis along the ascending colon, without evidence of diverticulitis.   Electronically Signed   By: Garald Balding M.D.   On: 05/31/2016 21:17 I personally reviewed the PET/CT, the CT from March 2018 CT from February 2015 and CT from August 2012.I concur with the findings noted above.  Impression: Mr. Janis is a 71 year old man with multiple significant medical issues who was found to have some mild mediastinal adenopathy on the CT done in March during evaluation for altered mental status. Follow-up visit 2 months later showed no significant change in size of the right paratracheal and prevascular lymph nodes which were mildly hypermetabolic with SUVs of 4 and 6 respectively. Looking  back at older CT scans these nodes have not changed significantly in size over time. Some of the CTs  were done to evaluate spinal issues but the nodes are visible and were roughly the same size and shape back in 2015. Going back to 2012 the nodes have increased by several centimeters in size but have not changed morphology.  I discussed the differential diagnosis and potential options with Mr. Ruddick and his nephew who accompanied him. Unfortunately the nodes are not amenable to biopsy from the same approach. The right paratracheal node could be accessed by EBUS or mediastinoscopy while prevascular node would require a Chamberlain procedure or VATS.   I discussed possible mediastinoscopy with Mr. Needs. He understands the general nature of the procedure including the need for general anesthesia and the incision to be used. He understands this usually done on an outpatient basis. He understands it is a low risk procedure but when complications occur they can be serious or life-threatening. He understands the risk include bleeding, infection, recurrent nerve injury leading to hoarseness, esophageal injury, bleeding, and stroke. He was very concerned about the possibility of complications even with the understanding that they only occur in about 1% cases.  We also discussed continued radiographic observation. He understands there is no way we can definitively rule out cancer based on his results. However, even if there is malignant process going on, it appears to be an extremely slow moving one.  After a long discussion and weighing his options, he wishes to opt for radiographic observation.  Plan: Return in 3 months with CT chest  Melrose Nakayama, MD [facility] [phone]

## 2016-08-16 ENCOUNTER — Ambulatory Visit: Payer: PPO | Admitting: Acute Care

## 2016-08-22 ENCOUNTER — Encounter: Payer: Self-pay | Admitting: Adult Health

## 2016-08-22 ENCOUNTER — Ambulatory Visit (INDEPENDENT_AMBULATORY_CARE_PROVIDER_SITE_OTHER): Payer: PPO | Admitting: Adult Health

## 2016-08-22 DIAGNOSIS — J439 Emphysema, unspecified: Secondary | ICD-10-CM | POA: Diagnosis not present

## 2016-08-22 DIAGNOSIS — R590 Localized enlarged lymph nodes: Secondary | ICD-10-CM

## 2016-08-22 MED ORDER — BUDESONIDE-FORMOTEROL FUMARATE 80-4.5 MCG/ACT IN AERO: 2.0000 | INHALATION_SPRAY | Freq: Two times a day (BID) | RESPIRATORY_TRACT | 0 refills | Status: DC

## 2016-08-22 NOTE — Assessment & Plan Note (Signed)
Mild COPD  Trial of Symbifcort   Plan  Patient Instructions  Begin Symbicort 80 2 puffs Twice daily , rinse after use .  Follow up CT chest as recommended by Dr. Roxan Hockey.  follow up Dr. Chase Caller in 3 months and As needed   Please contact office for sooner follow up if symptoms do not improve or worsen or seek emergency care  Discuss with Dr. Sabra Heck (PCP ) regarding coronary atherosclerosis noted on recent CT chest .

## 2016-08-22 NOTE — Patient Instructions (Signed)
Begin Symbicort 80 2 puffs Twice daily , rinse after use .  Follow up CT chest as recommended by Dr. Roxan Hockey.  follow up Dr. Chase Caller in 3 months and As needed   Please contact office for sooner follow up if symptoms do not improve or worsen or seek emergency care  Discuss with Dr. Sabra Heck (PCP ) regarding coronary atherosclerosis noted on recent CT chest .

## 2016-08-22 NOTE — Progress Notes (Signed)
Patient seen in the office today and instructed on use of Symbicort 80/4.43mcg.  Patient expressed understanding and demonstrated technique. Parke Poisson, [facility] 08/22/16

## 2016-08-22 NOTE — Progress Notes (Signed)
@Patient  ID: Michael Lutz, male    DOB: Jun 16, 1945, 71 y.o.   MRN: [id number]  Chief Complaint  Patient presents with  . Follow-up    Emphysema    Referring provider: Kathyrn Lass, MD  HPI: 71 year old male former smoker seen for pulmonary consult 07/20/2016 for COPD and abnormal CT chest   TEST  CT chest 05/31/2016 showed bilateral emphysematous changes. 2.1 cm right paratracheal node.   PET scan 08/10/2016 showed moderate metabolic activity along the paratracheal adenopathy. Favored to be reactive. Stable right lower lobe pulmonary nodule.   08/22/2016 Follow up : Emphysema and abnormal CT chest Patient returns for a one-month follow-up. She was seen last visit for a pulmonary consult for COPD and abnormal CT chest. Patient had a CT chest done 05/31/2016 that showed emphysematous changes. A 2.1 cm right paratracheal node. Patient was set up for a PET scan that was done on 08/10/2016 that showed moderate metabolic activity along the right paratracheal adenopathy. Patient was referred to Dr. Roxan Hockey. Notes were reviewed with decision to  to follow this with a CT chest in 3 months. We went over his test results. And answered all his questions. Patient does get some intermittent cough. And has shortness of breath. He denies any hemoptysis, chest pain, orthopnea, PND, or increased leg swelling  Pulmonary function test done on 07/28/2016 showed mild COPD with a FEV1 89%, ratio 62, FVC 106%, DLCO 54%.  No Known Allergies  Immunization History  Administered Date(s) Administered  . Influenza, High Dose Seasonal PF 12/12/2015  . Influenza-Unspecified 11/11/2013  . Pneumococcal-Unspecified 03/14/2015    Past Medical History:  Diagnosis Date  . AAA (abdominal aortic aneurysm) ([place])   . ADD (attention deficit disorder)   . Anxiety    takes Clonazepam daily  . Arthritis    left knee  . Bipolar 1 disorder ([place])    takes Seroquel nightly  . Bipolar affective disorder ([place])    in  remission on meds  . Blood transfusion    several times  . CHF (congestive heart failure) ([place])   . Chronic back pain    [facility]  . Chronic back pain    scoliosis  . Chronic low back pain 07/31/2013  . Colon polyps    Dr Cristina Gong does a colonoscopy every 5 years  . Constipation    takes Colace prn  . Depression    takes Effexor daily  . Dysrhythmia    palpitations- sees dr Irish Lack  . Enlarged prostate    slightly  . Full dentures   . GERD (gastroesophageal reflux disease)    takes Zantac daily  . Heart murmur    arrythmia  . History of shingles   . Hx of scabies Mid Feb 2013  . Hx of seasonal allergies   . Hyperlipidemia    takes Niacin daily and Lipitor daily  . Hypertension    takes Amlodipine and Quinapril daily  . Insomnia    takes Trazodone nightly  . Joint pain   . Leg pain 05-04-10   with walking  . Memory loss    short and long term  . Nocturia   . Peripheral edema    takes Furosemide prn  . Peripheral vascular disease ([place])    s/p AAA stent  . Urinary urgency   . Wears glasses     Tobacco History: History  Smoking Status  . Former Smoker  . Packs/day: 1.00  . Years: 50.00  . Types: Cigarettes  .  Quit date: 10/08/2013  Smokeless Tobacco  . Never Used    Comment: states smokes "little cigars" - former   Counseling given: Not Answered   Outpatient Encounter Prescriptions as of 08/22/2016  Medication Sig  . amLODipine (NORVASC) 10 MG tablet Take 10 mg by mouth daily.   . Ascorbic Acid (VITAMIN C) 1000 MG tablet Take 500 mg by mouth daily.   Marland Kitchen aspirin 325 MG tablet Take 162 mg by mouth daily.   Marland Kitchen atorvastatin (LIPITOR) 80 MG tablet Take 80 mg by mouth daily.  Marland Kitchen b complex vitamins tablet Take 1 tablet by mouth every morning.  . calcium carbonate (OS-CAL) 600 MG TABS Take 600 mg by mouth daily.    . cholecalciferol (VITAMIN D) 1000 UNITS tablet Take 2,000 Units by mouth every morning.   . clonazePAM (KLONOPIN) 0.5 MG tablet  Take 0.5 mg by mouth every 8 (eight) hours as needed for anxiety.   . diphenhydrAMINE (BENADRYL) 25 mg capsule Take 25 mg by mouth every 6 (six) hours as needed.  . doxepin (SINEQUAN) 25 MG capsule Take 1 capsule (25 mg total) by mouth at bedtime.  . fentaNYL (DURAGESIC - DOSED MCG/HR) 100 MCG/HR Place 100 mcg onto the skin every other day.   . furosemide (LASIX) 40 MG tablet Take 80 mg by mouth as needed. For fluid retention  . ibuprofen (ADVIL,MOTRIN) 200 MG tablet Take 200 mg by mouth every 6 (six) hours as needed. For pain  . magnesium oxide (MAG-OX) 400 MG tablet Take 400 mg by mouth daily.    . methocarbamol (ROBAXIN) 500 MG tablet Take 500 mg by mouth every 8 (eight) hours as needed for muscle spasms.  . Multiple Vitamin (MULTIVITAMIN) tablet Take 1 tablet by mouth daily.    . Potassium (GNP POTASSIUM) 99 MG TABS   . pregabalin (LYRICA) 100 MG capsule Take 1 capsule (100 mg total) by mouth 2 (two) times daily. (Patient taking differently: Take 150 mg by mouth 2 (two) times daily. )  . QUEtiapine (SEROQUEL) 100 MG tablet Take 1 tablet (100 mg total) by mouth at bedtime.  . quinapril (ACCUPRIL) 40 MG tablet Take 80 mg by mouth daily.   . ranitidine (ZANTAC) 75 MG tablet Take 75 mg by mouth daily as needed for heartburn.  . RESTASIS 0.05 % ophthalmic emulsion Place 1 drop into both eyes 2 (two) times daily.   . tamsulosin (FLOMAX) 0.4 MG CAPS capsule Take 0.4 mg by mouth daily.  . traZODone (DESYREL) 100 MG tablet Take 1 tablet (100 mg total) by mouth at bedtime.  Marland Kitchen venlafaxine XR (EFFEXOR-XR) 75 MG 24 hr capsule Take 1 capsule (75 mg total) by mouth daily with breakfast.  . vitamin E 1000 UNIT capsule Take 1,000 Units by mouth daily.  . budesonide-formoterol (SYMBICORT) 80-4.5 MCG/ACT inhaler Inhale 2 puffs into the lungs 2 (two) times daily.   No facility-administered encounter medications on file as of 08/22/2016.      Review of Systems  Constitutional:   No  weight loss, night  sweats,  Fevers, chills,  +fatigue, or  lassitude.  HEENT:   No headaches,  Difficulty swallowing,  Tooth/dental problems, or  Sore throat,                No sneezing, itching, ear ache, nasal congestion, post nasal drip,   CV:  No chest pain,  Orthopnea, PND, swelling in lower extremities, anasarca, dizziness, palpitations, syncope.   GI  No heartburn, indigestion, abdominal pain, nausea, vomiting, diarrhea,  change in bowel habits, loss of appetite, bloody stools.   Resp: No chest wall deformity  Skin: no rash or lesions.  GU: no dysuria, change in color of urine, no urgency or frequency.  No flank pain, no hematuria   MS:  No joint pain or swelling.  No decreased range of motion.  No back pain.    Physical Exam  BP 114/74 (BP Location: Left Arm, Cuff Size: Normal)   Pulse 61   Ht 5\' 9"  (1.753 m)   Wt 289 lb 3.2 oz (131.2 kg)   SpO2 96%   BMI 42.71 kg/m   GEN: A/Ox3; pleasant , NAD, obese    HEENT:  [place]/AT,  EACs-clear, TMs-wnl, NOSE-clear, THROAT-clear, no lesions, no postnasal drip or exudate noted.   NECK:  Supple w/ fair ROM; no JVD; normal carotid impulses w/o bruits; no thyromegaly or nodules palpated; no lymphadenopathy.    RESP  Clear  P & A; w/o, wheezes/ rales/ or rhonchi. no accessory muscle use, no dullness to percussion  CARD:  RRR, no m/r/g, tr  peripheral edema, pulses intact, no cyanosis or clubbing.  GI:   Soft & nt; nml bowel sounds; no organomegaly or masses detected.   Musco: Warm bil, no deformities or joint swelling noted.   Neuro: alert, no focal deficits noted.    Skin: Warm, no lesions or rashes    Lab Results:  CBC    Component Value Date/Time   WBC 9.1 06/04/2016 0408   RBC 5.32 06/04/2016 0408   HGB 15.1 06/04/2016 0408   HCT 45.9 06/04/2016 0408   PLT 116 (L) 06/04/2016 0408   MCV 86.3 06/04/2016 0408   MCH 28.4 06/04/2016 0408   MCHC 32.9 06/04/2016 0408   RDW 15.4 06/04/2016 0408   LYMPHSABS 2.0 07/28/2011 1640   MONOABS  0.5 07/28/2011 1640   EOSABS 0.5 07/28/2011 1640   BASOSABS 0.1 07/28/2011 1640    BMET    Component Value Date/Time   NA 139 06/04/2016 0408   K 3.9 06/04/2016 0408   CL 104 06/04/2016 0408   CO2 27 06/04/2016 0408   GLUCOSE 94 06/04/2016 0408   BUN 26 (H) 06/04/2016 0408   CREATININE 1.03 06/04/2016 0408   CREATININE 0.80 01/07/2014 1611   CALCIUM 9.2 06/04/2016 0408   GFRNONAA >60 06/04/2016 0408   GFRAA >60 06/04/2016 0408    BNP    Component Value Date/Time   BNP 169.6 (H) 05/31/2016 2052    ProBNP No results found for: PROBNP  Imaging: Nm Pet Image Initial (pi) Skull Base To Thigh  Result Date: 08/10/2016 CLINICAL DATA:  Initial treatment strategy for mediastinal mass. Enlarged mediastinal lymph node. EXAM: NUCLEAR MEDICINE PET SKULL BASE TO THIGH TECHNIQUE: 14.3 mCi F-18 FDG was injected intravenously. Full-ring PET imaging was performed from the skull base to thigh after the radiotracer. CT data was obtained and used for attenuation correction and anatomic localization. FASTING BLOOD GLUCOSE:  Value: 92 mg/dl COMPARISON:  CT 05/31/2016 FINDINGS: NECK No hypermetabolic lymph nodes in the neck. CHEST EnLarged RIGHT paratracheal lymph node measures 20 mm short axis. Mild metabolic activity (SUV max 4.6). Additional 10 mm prevascular lymph nodes with SUV max equal 6.1 No axillary adenopathy.  No supraclavicular adenopathy. Centrilobular emphysema in the upper lobes and paraseptal emphysema 7 mm nodule in the RIGHT lower lobe (image 86, series 4) is unchanged from prior exam. No associated metabolic activity. ABDOMEN/PELVIS No abnormal hypermetabolic activity within the liver, pancreas, adrenal glands, or spleen. No  hypermetabolic lymph nodes in the abdomen or pelvis. SKELETON No focal hypermetabolic activity to suggest skeletal metastasis. IMPRESSION: 1. Moderate metabolic activity associated with minimally enlarged prevascular and paratracheal adenopathy is favored reactive.  Cannot exclude a low-grade lymphoma such as chronic lymphocytic leukemia. As lesions are new from remote exam, consider follow-up CT scan in 6 months. 2. Stable RIGHT lower lobe pulmonary nodule over 3 month interval. Recommend follow-up CT in 6 months at same time as impression 1. Electronically Signed   By: Suzy Bouchard M.D.   On: 08/10/2016 17:20     Assessment & Plan:   Mediastinal adenopathy Cont follow up with CTS w/ follow up CT chest in 3 months .   Pulmonary emphysema (HCC) Mild COPD  Trial of Symbifcort   Plan  Patient Instructions  Begin Symbicort 80 2 puffs Twice daily , rinse after use .  Follow up CT chest as recommended by Dr. Roxan Hockey.  follow up Dr. Chase Caller in 3 months and As needed   Please contact office for sooner follow up if symptoms do not improve or worsen or seek emergency care  Discuss with Dr. Sabra Heck (PCP ) regarding coronary atherosclerosis noted on recent CT chest .        Rexene Edison, NP 08/22/2016

## 2016-08-22 NOTE — Assessment & Plan Note (Signed)
Cont follow up with CTS w/ follow up CT chest in 3 months .

## 2016-09-07 DIAGNOSIS — G894 Chronic pain syndrome: Secondary | ICD-10-CM | POA: Diagnosis not present

## 2016-09-07 DIAGNOSIS — Z79891 Long term (current) use of opiate analgesic: Secondary | ICD-10-CM | POA: Diagnosis not present

## 2016-09-07 DIAGNOSIS — F4322 Adjustment disorder with anxiety: Secondary | ICD-10-CM | POA: Diagnosis not present

## 2016-09-07 DIAGNOSIS — M961 Postlaminectomy syndrome, not elsewhere classified: Secondary | ICD-10-CM | POA: Diagnosis not present

## 2016-09-15 DIAGNOSIS — F3111 Bipolar disorder, current episode manic without psychotic features, mild: Secondary | ICD-10-CM | POA: Diagnosis not present

## 2016-09-21 ENCOUNTER — Ambulatory Visit: Payer: PPO | Admitting: Acute Care

## 2016-09-21 ENCOUNTER — Ambulatory Visit: Payer: PPO | Admitting: Adult Health

## 2016-10-05 DIAGNOSIS — G894 Chronic pain syndrome: Secondary | ICD-10-CM | POA: Diagnosis not present

## 2016-10-05 DIAGNOSIS — Z79891 Long term (current) use of opiate analgesic: Secondary | ICD-10-CM | POA: Diagnosis not present

## 2016-10-05 DIAGNOSIS — F4322 Adjustment disorder with anxiety: Secondary | ICD-10-CM | POA: Diagnosis not present

## 2016-10-05 DIAGNOSIS — M961 Postlaminectomy syndrome, not elsewhere classified: Secondary | ICD-10-CM | POA: Diagnosis not present

## 2016-10-16 DIAGNOSIS — Z4542 Encounter for adjustment and management of neuropacemaker (brain) (peripheral nerve) (spinal cord): Secondary | ICD-10-CM | POA: Diagnosis not present

## 2016-11-02 DIAGNOSIS — G894 Chronic pain syndrome: Secondary | ICD-10-CM | POA: Diagnosis not present

## 2016-11-02 DIAGNOSIS — M961 Postlaminectomy syndrome, not elsewhere classified: Secondary | ICD-10-CM | POA: Diagnosis not present

## 2016-11-02 DIAGNOSIS — M62838 Other muscle spasm: Secondary | ICD-10-CM | POA: Diagnosis not present

## 2016-11-02 DIAGNOSIS — Z79891 Long term (current) use of opiate analgesic: Secondary | ICD-10-CM | POA: Diagnosis not present

## 2016-11-03 ENCOUNTER — Other Ambulatory Visit: Payer: Self-pay | Admitting: Thoracic Surgery (Cardiothoracic Vascular Surgery)

## 2016-11-03 DIAGNOSIS — R590 Localized enlarged lymph nodes: Secondary | ICD-10-CM

## 2016-11-09 DIAGNOSIS — R351 Nocturia: Secondary | ICD-10-CM | POA: Diagnosis not present

## 2016-11-09 DIAGNOSIS — N401 Enlarged prostate with lower urinary tract symptoms: Secondary | ICD-10-CM | POA: Diagnosis not present

## 2016-11-09 DIAGNOSIS — R972 Elevated prostate specific antigen [PSA]: Secondary | ICD-10-CM | POA: Diagnosis not present

## 2016-11-09 DIAGNOSIS — R3121 Asymptomatic microscopic hematuria: Secondary | ICD-10-CM | POA: Diagnosis not present

## 2016-11-10 DIAGNOSIS — Z4542 Encounter for adjustment and management of neuropacemaker (brain) (peripheral nerve) (spinal cord): Secondary | ICD-10-CM | POA: Diagnosis not present

## 2016-11-10 DIAGNOSIS — T85193A Other mechanical complication of implanted electronic neurostimulator, generator, initial encounter: Secondary | ICD-10-CM | POA: Diagnosis not present

## 2016-11-10 DIAGNOSIS — T85192A Other mechanical complication of implanted electronic neurostimulator (electrode) of spinal cord, initial encounter: Secondary | ICD-10-CM | POA: Diagnosis not present

## 2016-11-28 ENCOUNTER — Ambulatory Visit: Payer: PPO | Admitting: Thoracic Surgery (Cardiothoracic Vascular Surgery)

## 2016-11-28 ENCOUNTER — Other Ambulatory Visit: Payer: PPO

## 2016-11-30 ENCOUNTER — Ambulatory Visit: Payer: PPO | Admitting: Internal Medicine

## 2016-12-11 DIAGNOSIS — F3111 Bipolar disorder, current episode manic without psychotic features, mild: Secondary | ICD-10-CM | POA: Diagnosis not present

## 2016-12-13 ENCOUNTER — Encounter: Payer: Self-pay | Admitting: Acute Care

## 2016-12-13 ENCOUNTER — Ambulatory Visit (INDEPENDENT_AMBULATORY_CARE_PROVIDER_SITE_OTHER): Payer: PPO | Admitting: Acute Care

## 2016-12-13 DIAGNOSIS — R911 Solitary pulmonary nodule: Secondary | ICD-10-CM | POA: Diagnosis not present

## 2016-12-13 DIAGNOSIS — J439 Emphysema, unspecified: Secondary | ICD-10-CM | POA: Diagnosis not present

## 2016-12-13 MED ORDER — BUDESONIDE-FORMOTEROL FUMARATE 80-4.5 MCG/ACT IN AERO: 2.0000 | INHALATION_SPRAY | Freq: Two times a day (BID) | RESPIRATORY_TRACT | 5 refills | Status: DC

## 2016-12-13 MED ORDER — BUDESONIDE-FORMOTEROL FUMARATE 80-4.5 MCG/ACT IN AERO: 2.0000 | INHALATION_SPRAY | Freq: Two times a day (BID) | RESPIRATORY_TRACT | 0 refills | Status: DC

## 2016-12-13 NOTE — Assessment & Plan Note (Signed)
Due for follow up CT Chest this month Plan We will schedule followup CT Chest scan this month.( Afternoon appointment) Follow up with Dr.Hendrickson after CT scan. Follow up with Dr. Chase Caller or Judson Roch NP after CT scan. Please contact office for sooner follow up if symptoms do not improve or worsen or seek emergency care

## 2016-12-13 NOTE — Assessment & Plan Note (Signed)
Mild COPD Plan: We will call in a prescription for Symbicort 2 puffs twice daily. This is your maintenance medication. Use this every day without fail. Rinse mouth after use. We will schedule followup CT Chest scan this month.( Afternoon appointment) Work on weight loss. Follow up with Dr.Hendrickson after CT scan. Follow up with Dr. Chase Caller or Judson Roch NP after CT scan. Consider sleep apnea testing in future. Please contact office for sooner follow up if symptoms do not improve or worsen or seek emergency care

## 2016-12-13 NOTE — Progress Notes (Signed)
History of Present Illness Michael Lutz is a 71 y.o. male  former smoker seen for pulmonary consult 07/20/2016 for COPD and abnormal CT chest . He is followed by Dr. Chase Caller.  Patient had a CT chest done 05/31/2016 during a hospital admission for acute encephalopathy   that showed emphysematous changes. A 2.1 cm right paratracheal node with mild mediastinal adenopathy . He was seen by Dr. Chase Caller for a pulmonary consult 07/2016. Patient had a  PET scan that was done 08/10/2016 which  showed moderate metabolic activity along the right paratracheal adenopathy. Patient was referred to Dr. Roxan Hockey. Notes were reviewed with decision to  follow this with a CT chest in 3 months. ( Due 11/2016).   12/13/2016 3 month Follow up COPD/ Pulmonary Nodule: Pt. Presents for follow up.He has not had his 3 month follow up CT scan done. He  does not have 3 month follow up appointment with Dr. Roxan Hockey, which was part of Dr. Leonarda Salon plan. We will schedule CT scan  today.He presents today for follow up. He states he has some shortness of breath, but that it is not too bad. He has not been using his Symbicort daily. He states he has only been using it as needed. We reviewed the difference between maintenance and rescue inhalers.He states he has not had cough. He does not have cough or secretions. He states he knows he needs to lose weight.He denies fever, chest pain, orthopnea or hemoptysis.   Test Results: CT chest 05/31/2016 showed bilateral emphysematous changes. 2.1 cm right paratracheal node.  PET scan 08/10/2016 showed moderate metabolic activity along the paratracheal adenopathy. Favored to be reactive. Stable right lower lobe pulmonary nodule.  Pulmonary function test done on 07/28/2016 showed mild COPD with a FEV1 89%, ratio 62, FVC 106%, DLCO 54%.  CBC Latest Ref Rng & Units 06/04/2016 06/02/2016 06/01/2016  WBC 4.0 - 10.5 K/uL 9.1 8.7 10.1  Hemoglobin 13.0 - 17.0 g/dL 15.1 15.5 17.4(H)    Hematocrit 39.0 - 52.0 % 45.9 46.2 50.1  Platelets 150 - 400 K/uL 116(L) 114(L) 130(L)    BMP Latest Ref Rng & Units 06/04/2016 06/02/2016 06/01/2016  Glucose 65 - 99 mg/dL 94 83 94  BUN 6 - 20 mg/dL 26(H) 22(H) 15  Creatinine 0.61 - 1.24 mg/dL 1.03 1.07 1.01  Sodium 135 - 145 mmol/L 139 138 140  Potassium 3.5 - 5.1 mmol/L 3.9 3.1(L) 3.7  Chloride 101 - 111 mmol/L 104 104 106  CO2 22 - 32 mmol/L 27 23 17(L)  Calcium 8.9 - 10.3 mg/dL 9.2 9.1 10.2    BNP    Component Value Date/Time   BNP 169.6 (H) 05/31/2016 2052     PFT    Component Value Date/Time   FEV1PRE 2.80 07/28/2016 1453   FVCPRE 4.51 07/28/2016 1453   DLCOUNC 16.79 07/28/2016 1453   PREFEV1FVCRT 62 07/28/2016 1453      Past medical hx Past Medical History:  Diagnosis Date  . AAA (abdominal aortic aneurysm) ([place])   . ADD (attention deficit disorder)   . Anxiety    takes Clonazepam daily  . Arthritis    left knee  . Bipolar 1 disorder ([place])    takes Seroquel nightly  . Bipolar affective disorder ([place])    in remission on meds  . Blood transfusion    several times  . CHF (congestive heart failure) ([place])   . Chronic back pain    [facility]  . Chronic back pain  scoliosis  . Chronic low back pain 07/31/2013  . Colon polyps    Dr Cristina Gong does a colonoscopy every 5 years  . Constipation    takes Colace prn  . Depression    takes Effexor daily  . Dysrhythmia    palpitations- sees dr Irish Lack  . Enlarged prostate    slightly  . Full dentures   . GERD (gastroesophageal reflux disease)    takes Zantac daily  . Heart murmur    arrythmia  . History of shingles   . Hx of scabies Mid Feb 2013  . Hx of seasonal allergies   . Hyperlipidemia    takes Niacin daily and Lipitor daily  . Hypertension    takes Amlodipine and Quinapril daily  . Insomnia    takes Trazodone nightly  . Joint pain   . Leg pain 05-04-10   with walking  . Memory loss    short and long term  . Nocturia    . Peripheral edema    takes Furosemide prn  . Peripheral vascular disease ([place])    s/p AAA stent  . Urinary urgency   . Wears glasses      Social History  Substance Use Topics  . Smoking status: Former Smoker    Packs/day: 1.00    Years: 50.00    Types: Cigarettes    Quit date: 10/08/2013  . Smokeless tobacco: Never Used     Comment: states smokes "little cigars" - former  . Alcohol use No    Michael Lutz reports that he quit smoking about 3 years ago. His smoking use included Cigarettes. He has a 50.00 pack-year smoking history. He has never used smokeless tobacco. He reports that he does not drink alcohol or use drugs.  Tobacco Cessation: Former smoker with a 50 pack year history quit 2015  Past surgical hx, Family hx, Social hx all reviewed.  Current Outpatient Prescriptions on File Prior to Visit  Medication Sig  . amLODipine (NORVASC) 10 MG tablet Take 10 mg by mouth daily.   . Ascorbic Acid (VITAMIN C) 1000 MG tablet Take 500 mg by mouth daily.   Marland Kitchen aspirin 325 MG tablet Take 162 mg by mouth daily.   Marland Kitchen atorvastatin (LIPITOR) 80 MG tablet Take 80 mg by mouth daily.  Marland Kitchen b complex vitamins tablet Take 1 tablet by mouth every morning.  . calcium carbonate (OS-CAL) 600 MG TABS Take 600 mg by mouth daily.    . cholecalciferol (VITAMIN D) 1000 UNITS tablet Take 2,000 Units by mouth every morning.   . clonazePAM (KLONOPIN) 0.5 MG tablet Take 0.5 mg by mouth every 8 (eight) hours as needed for anxiety.   . diphenhydrAMINE (BENADRYL) 25 mg capsule Take 25 mg by mouth every 6 (six) hours as needed.  . doxepin (SINEQUAN) 25 MG capsule Take 1 capsule (25 mg total) by mouth at bedtime.  . fentaNYL (DURAGESIC - DOSED MCG/HR) 100 MCG/HR Place 100 mcg onto the skin every other day.   . furosemide (LASIX) 40 MG tablet Take 80 mg by mouth as needed. For fluid retention  . ibuprofen (ADVIL,MOTRIN) 200 MG tablet Take 200 mg by mouth every 6 (six) hours as needed. For pain  . magnesium oxide  (MAG-OX) 400 MG tablet Take 400 mg by mouth daily.    . methocarbamol (ROBAXIN) 500 MG tablet Take 500 mg by mouth every 8 (eight) hours as needed for muscle spasms.  . Multiple Vitamin (MULTIVITAMIN) tablet Take 1 tablet by mouth daily.    Marland Kitchen  Potassium (GNP POTASSIUM) 99 MG TABS   . pregabalin (LYRICA) 100 MG capsule Take 1 capsule (100 mg total) by mouth 2 (two) times daily. (Patient taking differently: Take 150 mg by mouth 2 (two) times daily. )  . QUEtiapine (SEROQUEL) 100 MG tablet Take 1 tablet (100 mg total) by mouth at bedtime.  . quinapril (ACCUPRIL) 40 MG tablet Take 80 mg by mouth daily.   . ranitidine (ZANTAC) 75 MG tablet Take 75 mg by mouth daily as needed for heartburn.  . RESTASIS 0.05 % ophthalmic emulsion Place 1 drop into both eyes 2 (two) times daily.   . tamsulosin (FLOMAX) 0.4 MG CAPS capsule Take 0.4 mg by mouth daily.  . traZODone (DESYREL) 100 MG tablet Take 1 tablet (100 mg total) by mouth at bedtime.  Marland Kitchen venlafaxine XR (EFFEXOR-XR) 75 MG 24 hr capsule Take 1 capsule (75 mg total) by mouth daily with breakfast.  . vitamin E 1000 UNIT capsule Take 1,000 Units by mouth daily.   No current facility-administered medications on file prior to visit.      No Known Allergies  Review Of Systems:  Constitutional:   No  weight loss, night sweats,  Fevers, chills, fatigue, or  lassitude.  HEENT:   No headaches,  Difficulty swallowing,  Tooth/dental problems, or  Sore throat,                No sneezing, itching, ear ache, nasal congestion, post nasal drip,   CV:  No chest pain,  Orthopnea, PND, swelling in lower extremities, anasarca, dizziness, palpitations, syncope.   GI  No heartburn, indigestion, abdominal pain, nausea, vomiting, diarrhea, change in bowel habits, loss of appetite, bloody stools.   Resp: + shortness of breath with exertion less at rest.  No excess mucus, no productive cough,  No non-productive cough,  No coughing up of blood.  No change in color of mucus.   No wheezing.  No chest wall deformity  Skin: no rash or lesions.  GU: no dysuria, change in color of urine, no urgency or frequency.  No flank pain, no hematuria   MS:  No joint pain or swelling.  No decreased range of motion.  No back pain.  Psych:  No change in mood or affect. No depression or anxiety.  No memory loss.   Vital Signs BP 138/90 (BP Location: Left Arm, Cuff Size: Normal)   Pulse 74   Ht 5\' 9"  (1.753 m)   Wt 292 lb 9.6 oz (132.7 kg)   SpO2 95%   BMI 43.21 kg/m    Physical Exam:  General- No distress,  A&Ox3, pleasant ENT: No sinus tenderness, TM clear, pale nasal mucosa, no oral exudate,no post nasal drip, no LAN Cardiac: S1, S2, regular rate and rhythm, no murmur Chest: No wheeze/ rales/ dullness; no accessory muscle use, no nasal flaring, no sternal retractions Abd.: Soft Non-tender, obese Ext: No clubbing cyanosis, edema Neuro:  normal strength, deconditioned at baseline Skin: No rashes, warm and dry Psych: normal mood and behavior   Assessment/Plan  Pulmonary emphysema (HCC) Mild COPD Plan: We will call in a prescription for Symbicort 2 puffs twice daily. This is your maintenance medication. Use this every day without fail. Rinse mouth after use. We will schedule followup CT Chest scan this month.( Afternoon appointment) Work on weight loss. Follow up with Dr.Hendrickson after CT scan. Follow up with Dr. Chase Caller or Judson Roch NP after CT scan. Consider sleep apnea testing in future. Please contact office for sooner follow  up if symptoms do not improve or worsen or seek emergency care    Pulmonary nodule Due for follow up CT Chest this month Plan We will schedule followup CT Chest scan this month.( Afternoon appointment) Follow up with Dr.Hendrickson after CT scan. Follow up with Dr. Chase Caller or Judson Roch NP after CT scan. Please contact office for sooner follow up if symptoms do not improve or worsen or seek emergency care      Magdalen Spatz,  NP 12/13/2016  5:23 PM

## 2016-12-13 NOTE — Patient Instructions (Addendum)
It is nice to meet you today. We will call in a prescription for Symbicort 2 puffs twice daily. This is your maintenance medication. Use this every day without fail. Rinse mouth after use. We will schedule followup CT Chest scan this month.( Afternoon appointment) Work on weight loss. Follow up with Dr.Hendrickson after CT scan. Follow up with Dr. Chase Caller or Judson Roch NP after CT scan. Consider sleep apnea testing in future. Please contact office for sooner follow up if symptoms do not improve or worsen or seek emergency care

## 2017-01-01 DIAGNOSIS — G894 Chronic pain syndrome: Secondary | ICD-10-CM | POA: Diagnosis not present

## 2017-01-01 DIAGNOSIS — M961 Postlaminectomy syndrome, not elsewhere classified: Secondary | ICD-10-CM | POA: Diagnosis not present

## 2017-01-01 DIAGNOSIS — Z79891 Long term (current) use of opiate analgesic: Secondary | ICD-10-CM | POA: Diagnosis not present

## 2017-01-01 DIAGNOSIS — M62838 Other muscle spasm: Secondary | ICD-10-CM | POA: Diagnosis not present

## 2017-01-29 DIAGNOSIS — F3111 Bipolar disorder, current episode manic without psychotic features, mild: Secondary | ICD-10-CM | POA: Diagnosis not present

## 2017-02-27 DIAGNOSIS — Z79891 Long term (current) use of opiate analgesic: Secondary | ICD-10-CM | POA: Diagnosis not present

## 2017-02-27 DIAGNOSIS — G894 Chronic pain syndrome: Secondary | ICD-10-CM | POA: Diagnosis not present

## 2017-02-27 DIAGNOSIS — M62838 Other muscle spasm: Secondary | ICD-10-CM | POA: Diagnosis not present

## 2017-02-27 DIAGNOSIS — M961 Postlaminectomy syndrome, not elsewhere classified: Secondary | ICD-10-CM | POA: Diagnosis not present

## 2017-03-02 DIAGNOSIS — F3111 Bipolar disorder, current episode manic without psychotic features, mild: Secondary | ICD-10-CM | POA: Diagnosis not present

## 2017-04-10 ENCOUNTER — Other Ambulatory Visit: Payer: Self-pay

## 2017-04-10 DIAGNOSIS — I6529 Occlusion and stenosis of unspecified carotid artery: Secondary | ICD-10-CM

## 2017-04-10 DIAGNOSIS — I714 Abdominal aortic aneurysm, without rupture, unspecified: Secondary | ICD-10-CM

## 2017-04-11 ENCOUNTER — Other Ambulatory Visit (HOSPITAL_COMMUNITY): Payer: PPO

## 2017-04-11 ENCOUNTER — Ambulatory Visit: Payer: PPO | Admitting: Family

## 2017-04-11 ENCOUNTER — Encounter (HOSPITAL_COMMUNITY): Payer: PPO

## 2017-04-17 ENCOUNTER — Ambulatory Visit (HOSPITAL_COMMUNITY): Payer: PPO

## 2017-04-17 ENCOUNTER — Ambulatory Visit (HOSPITAL_COMMUNITY): Payer: PPO | Attending: Family

## 2017-04-17 ENCOUNTER — Ambulatory Visit: Payer: PPO | Admitting: Family

## 2017-04-17 DIAGNOSIS — Z79891 Long term (current) use of opiate analgesic: Secondary | ICD-10-CM | POA: Diagnosis not present

## 2017-04-17 DIAGNOSIS — M62838 Other muscle spasm: Secondary | ICD-10-CM | POA: Diagnosis not present

## 2017-04-17 DIAGNOSIS — M961 Postlaminectomy syndrome, not elsewhere classified: Secondary | ICD-10-CM | POA: Diagnosis not present

## 2017-04-17 DIAGNOSIS — G894 Chronic pain syndrome: Secondary | ICD-10-CM | POA: Diagnosis not present

## 2017-05-28 ENCOUNTER — Other Ambulatory Visit: Payer: Self-pay | Admitting: Acute Care

## 2017-06-12 DIAGNOSIS — M961 Postlaminectomy syndrome, not elsewhere classified: Secondary | ICD-10-CM | POA: Diagnosis not present

## 2017-06-12 DIAGNOSIS — M62838 Other muscle spasm: Secondary | ICD-10-CM | POA: Diagnosis not present

## 2017-06-12 DIAGNOSIS — Z79891 Long term (current) use of opiate analgesic: Secondary | ICD-10-CM | POA: Diagnosis not present

## 2017-06-12 DIAGNOSIS — G894 Chronic pain syndrome: Secondary | ICD-10-CM | POA: Diagnosis not present

## 2017-07-03 DIAGNOSIS — G894 Chronic pain syndrome: Secondary | ICD-10-CM | POA: Diagnosis not present

## 2017-07-03 DIAGNOSIS — Z87891 Personal history of nicotine dependence: Secondary | ICD-10-CM | POA: Diagnosis not present

## 2017-07-03 DIAGNOSIS — M8588 Other specified disorders of bone density and structure, other site: Secondary | ICD-10-CM | POA: Diagnosis not present

## 2017-07-03 DIAGNOSIS — M79604 Pain in right leg: Secondary | ICD-10-CM | POA: Diagnosis not present

## 2017-07-03 DIAGNOSIS — F319 Bipolar disorder, unspecified: Secondary | ICD-10-CM | POA: Diagnosis not present

## 2017-07-06 ENCOUNTER — Other Ambulatory Visit: Payer: Self-pay | Admitting: Family Medicine

## 2017-07-06 DIAGNOSIS — Z87891 Personal history of nicotine dependence: Secondary | ICD-10-CM

## 2017-07-12 ENCOUNTER — Ambulatory Visit
Admission: RE | Admit: 2017-07-12 | Discharge: 2017-07-12 | Disposition: A | Discharge status: Home / Self Care | Payer: PPO | Source: Ambulatory Visit | Attending: Family Medicine | Admitting: Family Medicine

## 2017-07-12 DIAGNOSIS — Z87891 Personal history of nicotine dependence: Secondary | ICD-10-CM

## 2017-07-12 DIAGNOSIS — F17210 Nicotine dependence, cigarettes, uncomplicated: Secondary | ICD-10-CM | POA: Diagnosis not present

## 2017-07-16 DIAGNOSIS — M545 Low back pain: Secondary | ICD-10-CM | POA: Diagnosis not present

## 2017-07-16 DIAGNOSIS — M25551 Pain in right hip: Secondary | ICD-10-CM | POA: Diagnosis not present

## 2017-07-16 DIAGNOSIS — M7061 Trochanteric bursitis, right hip: Secondary | ICD-10-CM | POA: Diagnosis not present

## 2017-07-16 DIAGNOSIS — M7062 Trochanteric bursitis, left hip: Secondary | ICD-10-CM | POA: Diagnosis not present

## 2017-07-24 DIAGNOSIS — H40013 Open angle with borderline findings, low risk, bilateral: Secondary | ICD-10-CM | POA: Diagnosis not present

## 2017-07-24 DIAGNOSIS — H25013 Cortical age-related cataract, bilateral: Secondary | ICD-10-CM | POA: Diagnosis not present

## 2017-07-24 DIAGNOSIS — H2513 Age-related nuclear cataract, bilateral: Secondary | ICD-10-CM | POA: Diagnosis not present

## 2017-07-24 DIAGNOSIS — H04123 Dry eye syndrome of bilateral lacrimal glands: Secondary | ICD-10-CM | POA: Diagnosis not present

## 2017-07-30 DIAGNOSIS — M7062 Trochanteric bursitis, left hip: Secondary | ICD-10-CM | POA: Diagnosis not present

## 2017-07-30 DIAGNOSIS — M79675 Pain in left toe(s): Secondary | ICD-10-CM | POA: Diagnosis not present

## 2017-07-30 DIAGNOSIS — M7061 Trochanteric bursitis, right hip: Secondary | ICD-10-CM | POA: Diagnosis not present

## 2017-07-31 ENCOUNTER — Other Ambulatory Visit: Payer: Self-pay | Admitting: Acute Care

## 2017-08-07 DIAGNOSIS — G894 Chronic pain syndrome: Secondary | ICD-10-CM | POA: Diagnosis not present

## 2017-08-07 DIAGNOSIS — M62838 Other muscle spasm: Secondary | ICD-10-CM | POA: Diagnosis not present

## 2017-08-07 DIAGNOSIS — Z79891 Long term (current) use of opiate analgesic: Secondary | ICD-10-CM | POA: Diagnosis not present

## 2017-08-07 DIAGNOSIS — M961 Postlaminectomy syndrome, not elsewhere classified: Secondary | ICD-10-CM | POA: Diagnosis not present

## 2017-08-13 DIAGNOSIS — F3111 Bipolar disorder, current episode manic without psychotic features, mild: Secondary | ICD-10-CM | POA: Diagnosis not present

## 2017-08-16 DIAGNOSIS — H2513 Age-related nuclear cataract, bilateral: Secondary | ICD-10-CM | POA: Diagnosis not present

## 2017-08-16 DIAGNOSIS — H2511 Age-related nuclear cataract, right eye: Secondary | ICD-10-CM | POA: Diagnosis not present

## 2017-08-16 DIAGNOSIS — H25013 Cortical age-related cataract, bilateral: Secondary | ICD-10-CM | POA: Diagnosis not present

## 2017-08-29 DIAGNOSIS — M7061 Trochanteric bursitis, right hip: Secondary | ICD-10-CM | POA: Diagnosis not present

## 2017-08-29 DIAGNOSIS — M25551 Pain in right hip: Secondary | ICD-10-CM | POA: Diagnosis not present

## 2017-09-19 DIAGNOSIS — H25811 Combined forms of age-related cataract, right eye: Secondary | ICD-10-CM | POA: Diagnosis not present

## 2017-09-19 DIAGNOSIS — H2511 Age-related nuclear cataract, right eye: Secondary | ICD-10-CM | POA: Diagnosis not present

## 2017-10-09 DIAGNOSIS — Z79891 Long term (current) use of opiate analgesic: Secondary | ICD-10-CM | POA: Diagnosis not present

## 2017-10-09 DIAGNOSIS — G894 Chronic pain syndrome: Secondary | ICD-10-CM | POA: Diagnosis not present

## 2017-10-09 DIAGNOSIS — M961 Postlaminectomy syndrome, not elsewhere classified: Secondary | ICD-10-CM | POA: Diagnosis not present

## 2017-10-09 DIAGNOSIS — M62838 Other muscle spasm: Secondary | ICD-10-CM | POA: Diagnosis not present

## 2017-10-16 DIAGNOSIS — H25012 Cortical age-related cataract, left eye: Secondary | ICD-10-CM | POA: Diagnosis not present

## 2017-10-16 DIAGNOSIS — H2512 Age-related nuclear cataract, left eye: Secondary | ICD-10-CM | POA: Diagnosis not present

## 2017-11-30 DIAGNOSIS — F3111 Bipolar disorder, current episode manic without psychotic features, mild: Secondary | ICD-10-CM | POA: Diagnosis not present

## 2017-12-06 DIAGNOSIS — M62838 Other muscle spasm: Secondary | ICD-10-CM | POA: Diagnosis not present

## 2017-12-06 DIAGNOSIS — M961 Postlaminectomy syndrome, not elsewhere classified: Secondary | ICD-10-CM | POA: Diagnosis not present

## 2017-12-06 DIAGNOSIS — G894 Chronic pain syndrome: Secondary | ICD-10-CM | POA: Diagnosis not present

## 2017-12-06 DIAGNOSIS — Z79891 Long term (current) use of opiate analgesic: Secondary | ICD-10-CM | POA: Diagnosis not present

## 2017-12-25 DIAGNOSIS — I2510 Atherosclerotic heart disease of native coronary artery without angina pectoris: Secondary | ICD-10-CM | POA: Diagnosis not present

## 2017-12-25 DIAGNOSIS — R600 Localized edema: Secondary | ICD-10-CM | POA: Diagnosis not present

## 2017-12-25 DIAGNOSIS — E7800 Pure hypercholesterolemia, unspecified: Secondary | ICD-10-CM | POA: Diagnosis not present

## 2017-12-25 DIAGNOSIS — I714 Abdominal aortic aneurysm, without rupture: Secondary | ICD-10-CM | POA: Diagnosis not present

## 2017-12-25 DIAGNOSIS — I10 Essential (primary) hypertension: Secondary | ICD-10-CM | POA: Diagnosis not present

## 2017-12-25 DIAGNOSIS — J439 Emphysema, unspecified: Secondary | ICD-10-CM | POA: Diagnosis not present

## 2017-12-25 DIAGNOSIS — G894 Chronic pain syndrome: Secondary | ICD-10-CM | POA: Diagnosis not present

## 2017-12-25 DIAGNOSIS — R972 Elevated prostate specific antigen [PSA]: Secondary | ICD-10-CM | POA: Diagnosis not present

## 2017-12-25 DIAGNOSIS — F319 Bipolar disorder, unspecified: Secondary | ICD-10-CM | POA: Diagnosis not present

## 2017-12-25 DIAGNOSIS — Z131 Encounter for screening for diabetes mellitus: Secondary | ICD-10-CM | POA: Diagnosis not present

## 2018-02-06 DIAGNOSIS — Z79891 Long term (current) use of opiate analgesic: Secondary | ICD-10-CM | POA: Diagnosis not present

## 2018-02-06 DIAGNOSIS — M62838 Other muscle spasm: Secondary | ICD-10-CM | POA: Diagnosis not present

## 2018-02-06 DIAGNOSIS — G894 Chronic pain syndrome: Secondary | ICD-10-CM | POA: Diagnosis not present

## 2018-02-06 DIAGNOSIS — M961 Postlaminectomy syndrome, not elsewhere classified: Secondary | ICD-10-CM | POA: Diagnosis not present

## 2018-04-04 DIAGNOSIS — M961 Postlaminectomy syndrome, not elsewhere classified: Secondary | ICD-10-CM | POA: Diagnosis not present

## 2018-04-04 DIAGNOSIS — G894 Chronic pain syndrome: Secondary | ICD-10-CM | POA: Diagnosis not present

## 2018-04-04 DIAGNOSIS — Z79891 Long term (current) use of opiate analgesic: Secondary | ICD-10-CM | POA: Diagnosis not present

## 2018-04-04 DIAGNOSIS — M62838 Other muscle spasm: Secondary | ICD-10-CM | POA: Diagnosis not present

## 2018-04-25 ENCOUNTER — Other Ambulatory Visit (HOSPITAL_COMMUNITY): Payer: Self-pay | Admitting: Psychiatry

## 2018-04-26 DIAGNOSIS — F3111 Bipolar disorder, current episode manic without psychotic features, mild: Secondary | ICD-10-CM | POA: Diagnosis not present

## 2018-04-29 ENCOUNTER — Other Ambulatory Visit: Payer: Self-pay

## 2018-04-29 NOTE — Patient Outreach (Addendum)
[facility] [facility]) Care Management  04/29/2018  Michael Lutz April 13, 1945 [id number]   Referral Date: 04/29/2018 Referral Source: HTA conceirge Referral Reason: assist with mental health co-pays, free counseling   Outreach Attempt: no answer.  Unable to leave a voice message.   Plan: RN CM will attempt patient again within 4 business days and send letter.   Jone Baseman, RN, MSN [facility] Care Management Care Management Coordinator Direct Line [phone] Toll Free: [phone]  Fax: [phone]

## 2018-04-30 ENCOUNTER — Other Ambulatory Visit: Payer: Self-pay

## 2018-04-30 NOTE — Patient Outreach (Signed)
[place] [facility]) Care Management  04/30/2018  Michael Lutz 01/31/1946 [id number]   Referral Date: 04/29/2018 Referral Source: HTA concierge Referral Reason: assist with mental health co-pays, free counseling   Outreach Attempt: no answer.  Unable to leave a voice message.   Plan: RN CM will attempt patient again within 4 business days.  Jone Baseman, RN, MSN [place] Management Care Management Coordinator Direct Line [phone] Cell [phone] Toll Free: [phone]  Fax: [phone]

## 2018-05-02 ENCOUNTER — Other Ambulatory Visit: Payer: Self-pay

## 2018-05-02 NOTE — Patient Outreach (Signed)
[place] [facility]) Care Management  05/02/2018  Michael Lutz 02-Sep-1945 [id number]   Referral Date: 04/29/2018 Referral Source: HTA conceirge Referral Reason: assist with mental health co-pays, free counseling   Outreach Attempt: no answer.  Unable to leave a voice message.   Plan: RN CM will wait return call.  If no return call will close case.  Jone Baseman, RN, MSN [facility] Management Care Management Coordinator Direct Line [phone] Cell [phone] Toll Free: [phone]  Fax: [phone]

## 2018-05-12 ENCOUNTER — Other Ambulatory Visit (HOSPITAL_COMMUNITY): Payer: Self-pay | Admitting: Psychiatry

## 2018-05-13 ENCOUNTER — Other Ambulatory Visit: Payer: Self-pay

## 2018-05-13 NOTE — Patient Outreach (Signed)
[facility]) Care Management  05/13/2018  Michael Lutz Oct 03, 1945 [id number]   Referral Date: 05/13/2018 Referral Source:nurseline Referral Reason: Has bad cold and wondered if he could go see his sister   Outreach Attempt: no answer. Unable to leave a message.   Plan: If no return call will close case.    Jone Baseman, RN, MSN [facility] Care Management Care Management Coordinator Direct Line [phone] Toll Free: [phone]  Fax: [phone]

## 2018-05-14 ENCOUNTER — Other Ambulatory Visit: Payer: Self-pay

## 2018-05-14 NOTE — Patient Outreach (Signed)
[facility]) Care Management  05/14/2018  YVETTE ROARK 1945-12-15 [id number]   Multiple attempts to establish contact with patient without success. No response from letter mailed to patient.   Plan: RN CM will close case at this time.   Jone Baseman, RN, MSN [place] Management Care Management Coordinator Direct Line [phone] Cell [phone] Toll Free: [phone]  Fax: [phone]

## 2018-06-03 DIAGNOSIS — M62838 Other muscle spasm: Secondary | ICD-10-CM | POA: Diagnosis not present

## 2018-06-03 DIAGNOSIS — M961 Postlaminectomy syndrome, not elsewhere classified: Secondary | ICD-10-CM | POA: Diagnosis not present

## 2018-06-03 DIAGNOSIS — G894 Chronic pain syndrome: Secondary | ICD-10-CM | POA: Diagnosis not present

## 2018-06-03 DIAGNOSIS — Z79891 Long term (current) use of opiate analgesic: Secondary | ICD-10-CM | POA: Diagnosis not present

## 2018-06-21 ENCOUNTER — Other Ambulatory Visit (HOSPITAL_COMMUNITY): Payer: Self-pay | Admitting: Psychiatry

## 2018-06-22 ENCOUNTER — Other Ambulatory Visit (HOSPITAL_COMMUNITY): Payer: Self-pay | Admitting: Psychiatry

## 2018-06-24 DIAGNOSIS — R404 Transient alteration of awareness: Secondary | ICD-10-CM | POA: Diagnosis not present

## 2018-06-28 ENCOUNTER — Other Ambulatory Visit (HOSPITAL_COMMUNITY): Payer: Self-pay | Admitting: Psychiatry

## 2018-07-12 DIAGNOSIS — F17210 Nicotine dependence, cigarettes, uncomplicated: Secondary | ICD-10-CM | POA: Diagnosis not present

## 2018-07-12 DIAGNOSIS — Z125 Encounter for screening for malignant neoplasm of prostate: Secondary | ICD-10-CM | POA: Diagnosis not present

## 2018-07-12 DIAGNOSIS — Z8679 Personal history of other diseases of the circulatory system: Secondary | ICD-10-CM | POA: Diagnosis not present

## 2018-07-12 DIAGNOSIS — Z6838 Body mass index (BMI) 38.0-38.9, adult: Secondary | ICD-10-CM | POA: Diagnosis not present

## 2018-07-12 DIAGNOSIS — M5136 Other intervertebral disc degeneration, lumbar region: Secondary | ICD-10-CM | POA: Diagnosis not present

## 2018-07-12 DIAGNOSIS — I10 Essential (primary) hypertension: Secondary | ICD-10-CM | POA: Diagnosis not present

## 2018-07-12 DIAGNOSIS — R3914 Feeling of incomplete bladder emptying: Secondary | ICD-10-CM | POA: Diagnosis not present

## 2018-07-12 DIAGNOSIS — Z0000 Encounter for general adult medical examination without abnormal findings: Secondary | ICD-10-CM | POA: Diagnosis not present

## 2018-07-12 DIAGNOSIS — N401 Enlarged prostate with lower urinary tract symptoms: Secondary | ICD-10-CM | POA: Diagnosis not present

## 2018-07-12 DIAGNOSIS — E7800 Pure hypercholesterolemia, unspecified: Secondary | ICD-10-CM | POA: Diagnosis not present

## 2018-07-15 ENCOUNTER — Other Ambulatory Visit: Payer: Self-pay | Admitting: Family Medicine

## 2018-07-15 DIAGNOSIS — F17210 Nicotine dependence, cigarettes, uncomplicated: Secondary | ICD-10-CM

## 2018-08-01 DIAGNOSIS — G894 Chronic pain syndrome: Secondary | ICD-10-CM | POA: Diagnosis not present

## 2018-08-01 DIAGNOSIS — M961 Postlaminectomy syndrome, not elsewhere classified: Secondary | ICD-10-CM | POA: Diagnosis not present

## 2018-08-01 DIAGNOSIS — M25512 Pain in left shoulder: Secondary | ICD-10-CM | POA: Diagnosis not present

## 2018-08-01 DIAGNOSIS — Z79891 Long term (current) use of opiate analgesic: Secondary | ICD-10-CM | POA: Diagnosis not present

## 2018-08-21 DIAGNOSIS — N401 Enlarged prostate with lower urinary tract symptoms: Secondary | ICD-10-CM | POA: Diagnosis not present

## 2018-08-21 DIAGNOSIS — R351 Nocturia: Secondary | ICD-10-CM | POA: Diagnosis not present

## 2018-08-21 DIAGNOSIS — R3121 Asymptomatic microscopic hematuria: Secondary | ICD-10-CM | POA: Diagnosis not present

## 2018-08-21 DIAGNOSIS — R972 Elevated prostate specific antigen [PSA]: Secondary | ICD-10-CM | POA: Diagnosis not present

## 2018-08-26 IMAGING — CT CT CHEST LUNG CANCER SCREENING LOW DOSE W/O CM
1 of 4 series · 4 of 40 positions shown, 5 images · non-contrast
Comparison: CTA of 05/31/2016. PET 08/10/2016. No prior screening
CT.

CLINICAL DATA: Forty-five pack-year smoking history. Current
smoker. No history of primary malignancy.

EXAM:
CT CHEST WITHOUT CONTRAST LOW-DOSE FOR LUNG CANCER SCREENING
TECHNIQUE: Multidetector CT imaging of the chest was performed following the
standard protocol without IV contrast.

[Series 6: super d · axial · 0.90mm/px · z∈[-304,-251]mm · 4 of 89 slices shown, 5 images]
[im 18/89  mediastinal]
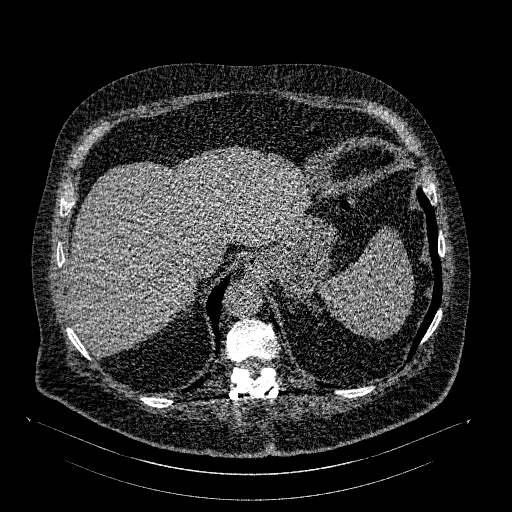
[im 18/89  lung]
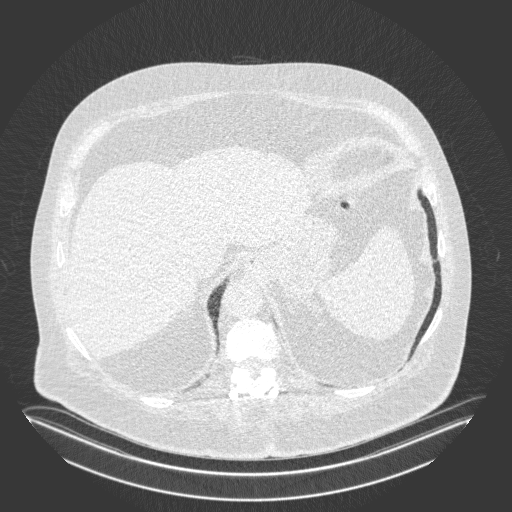
[im 36/89  lung]
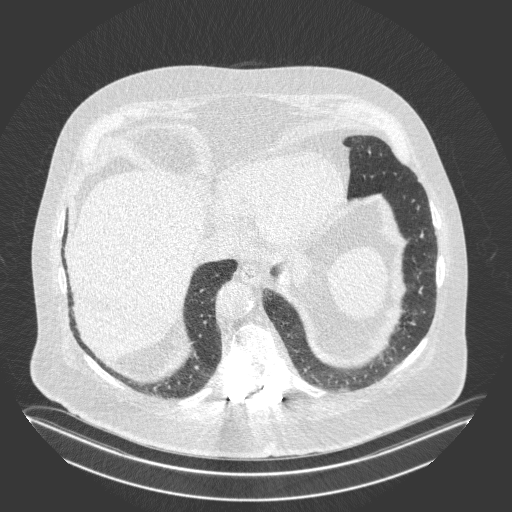
[im 53/89  lung]
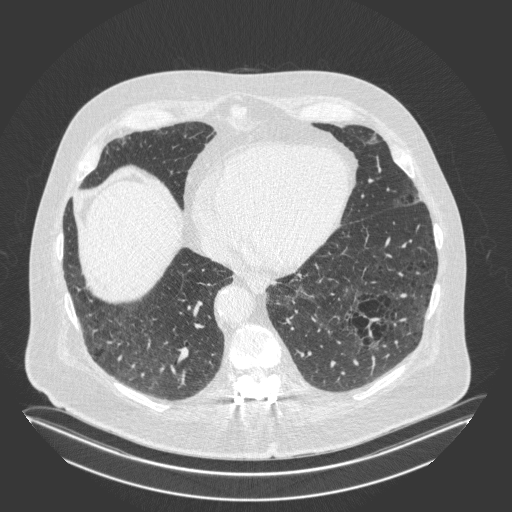
[im 71/89  lung]
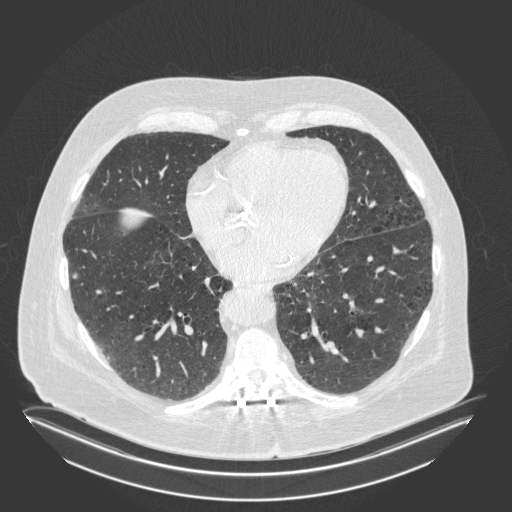

[4 of 40 positions shown; findings below may reference images not displayed]

FINDINGS: Cardiovascular: Aortic and branch vessel atherosclerosis. Tortuous
thoracic aorta. Normal heart size with minimal anterior pericardial
fluid or thickening. Multivessel coronary artery atherosclerosis.
Aortic valve calcifications.

Mediastinum/Nodes: Precarinal node is similarly enlarged, including
at 1.9 cm on image [DATE]. A 10 mm prevascular node is also not
significantly changed. Hilar regions poorly evaluated without
intravenous contrast.

Lungs/Pleura: No pleural fluid. Moderate bullous type emphysema.
Isolated right lower lobe pulmonary nodule measures volume derived
equivalent diameter 9.3 mm on image 199/3. This is similar to
05/31/2016.

Upper Abdomen: Cholecystectomy. Normal imaged portions of the
spleen, stomach, pancreas. Left greater than right adrenal
thickening and nodularity, similar. Suboptimally evaluated secondary
to beam hardening artifact from thoracolumbar spine fixation.
Bilateral fluid density renal lesions are likely cysts. Abdominal
aortic and prominent splenic artery atherosclerotic calcifications.

Musculoskeletal: Thoracolumbar spine fixation. Dorsal spinal
stimulator. Moderate thoracic spondylosis.
IMPRESSION: 1. Lung-RADS 2, benign appearance or behavior. Continue annual
screening with low-dose chest CT without contrast in 12 months.
2. Aortic atherosclerosis (O4FTI-OD9.9), coronary artery
atherosclerosis and emphysema (O4FTI-EXR.Q).
3. Aortic valvular calcifications. Consider echocardiography to
evaluate for valvular dysfunction.
4. Similar thoracic adenopathy. Most likely reactive. Indolent
lymphoproliferative process could look similar. Recommend attention
on follow-up.

## 2018-09-06 DIAGNOSIS — Z1159 Encounter for screening for other viral diseases: Secondary | ICD-10-CM | POA: Diagnosis not present

## 2018-09-09 DIAGNOSIS — F3111 Bipolar disorder, current episode manic without psychotic features, mild: Secondary | ICD-10-CM | POA: Diagnosis not present

## 2018-09-18 ENCOUNTER — Ambulatory Visit: Payer: PPO | Admitting: Family

## 2018-09-18 ENCOUNTER — Encounter (HOSPITAL_COMMUNITY): Payer: PPO

## 2018-09-18 ENCOUNTER — Ambulatory Visit (HOSPITAL_COMMUNITY): Payer: PPO

## 2018-09-19 ENCOUNTER — Other Ambulatory Visit: Payer: Self-pay

## 2018-09-19 DIAGNOSIS — I6529 Occlusion and stenosis of unspecified carotid artery: Secondary | ICD-10-CM

## 2018-09-19 DIAGNOSIS — I714 Abdominal aortic aneurysm, without rupture, unspecified: Secondary | ICD-10-CM

## 2018-09-19 NOTE — Progress Notes (Signed)
car

## 2018-09-25 ENCOUNTER — Ambulatory Visit: Payer: PPO | Admitting: Family

## 2018-09-25 ENCOUNTER — Encounter: Payer: Self-pay | Admitting: Family

## 2018-09-25 ENCOUNTER — Inpatient Hospital Stay (HOSPITAL_COMMUNITY): Admission: RE | Admit: 2018-09-25 | Payer: PPO | Source: Ambulatory Visit

## 2018-09-30 DIAGNOSIS — G894 Chronic pain syndrome: Secondary | ICD-10-CM | POA: Diagnosis not present

## 2018-09-30 DIAGNOSIS — Z79891 Long term (current) use of opiate analgesic: Secondary | ICD-10-CM | POA: Diagnosis not present

## 2018-09-30 DIAGNOSIS — M25512 Pain in left shoulder: Secondary | ICD-10-CM | POA: Diagnosis not present

## 2018-09-30 DIAGNOSIS — M961 Postlaminectomy syndrome, not elsewhere classified: Secondary | ICD-10-CM | POA: Diagnosis not present

## 2018-10-01 DIAGNOSIS — Z79891 Long term (current) use of opiate analgesic: Secondary | ICD-10-CM | POA: Diagnosis not present

## 2018-10-01 DIAGNOSIS — G894 Chronic pain syndrome: Secondary | ICD-10-CM | POA: Diagnosis not present

## 2018-11-01 ENCOUNTER — Ambulatory Visit
Admission: RE | Admit: 2018-11-01 | Discharge: 2018-11-01 | Disposition: A | Discharge status: Home / Self Care | Payer: PPO | Source: Ambulatory Visit | Attending: Family Medicine | Admitting: Family Medicine

## 2018-11-01 ENCOUNTER — Other Ambulatory Visit: Payer: Self-pay

## 2018-11-01 DIAGNOSIS — F17210 Nicotine dependence, cigarettes, uncomplicated: Secondary | ICD-10-CM | POA: Diagnosis not present

## 2018-11-05 DIAGNOSIS — M79671 Pain in right foot: Secondary | ICD-10-CM | POA: Diagnosis not present

## 2018-11-05 DIAGNOSIS — M79672 Pain in left foot: Secondary | ICD-10-CM | POA: Diagnosis not present

## 2018-11-08 DIAGNOSIS — M79671 Pain in right foot: Secondary | ICD-10-CM | POA: Diagnosis not present

## 2018-11-08 DIAGNOSIS — M79672 Pain in left foot: Secondary | ICD-10-CM | POA: Diagnosis not present

## 2018-11-11 ENCOUNTER — Other Ambulatory Visit: Payer: Self-pay | Admitting: Orthopaedic Surgery

## 2018-11-11 DIAGNOSIS — M79671 Pain in right foot: Secondary | ICD-10-CM

## 2018-11-15 ENCOUNTER — Ambulatory Visit
Admission: RE | Admit: 2018-11-15 | Discharge: 2018-11-15 | Disposition: A | Discharge status: Home / Self Care | Payer: PPO | Source: Ambulatory Visit | Attending: Orthopaedic Surgery | Admitting: Orthopaedic Surgery

## 2018-11-15 DIAGNOSIS — S92321A Displaced fracture of second metatarsal bone, right foot, initial encounter for closed fracture: Secondary | ICD-10-CM | POA: Diagnosis not present

## 2018-11-15 DIAGNOSIS — M79671 Pain in right foot: Secondary | ICD-10-CM

## 2018-11-20 DIAGNOSIS — M79671 Pain in right foot: Secondary | ICD-10-CM | POA: Diagnosis not present

## 2018-11-25 DIAGNOSIS — E7800 Pure hypercholesterolemia, unspecified: Secondary | ICD-10-CM | POA: Diagnosis not present

## 2018-11-25 DIAGNOSIS — R972 Elevated prostate specific antigen [PSA]: Secondary | ICD-10-CM | POA: Diagnosis not present

## 2018-11-25 DIAGNOSIS — I10 Essential (primary) hypertension: Secondary | ICD-10-CM | POA: Diagnosis not present

## 2018-11-25 DIAGNOSIS — B351 Tinea unguium: Secondary | ICD-10-CM | POA: Diagnosis not present

## 2018-11-25 DIAGNOSIS — Z79899 Other long term (current) drug therapy: Secondary | ICD-10-CM | POA: Diagnosis not present

## 2018-11-25 DIAGNOSIS — M8580 Other specified disorders of bone density and structure, unspecified site: Secondary | ICD-10-CM | POA: Diagnosis not present

## 2018-11-25 DIAGNOSIS — F17210 Nicotine dependence, cigarettes, uncomplicated: Secondary | ICD-10-CM | POA: Diagnosis not present

## 2018-11-26 DIAGNOSIS — M654 Radial styloid tenosynovitis [de Quervain]: Secondary | ICD-10-CM | POA: Diagnosis not present

## 2018-11-28 DIAGNOSIS — G894 Chronic pain syndrome: Secondary | ICD-10-CM | POA: Diagnosis not present

## 2018-11-28 DIAGNOSIS — Z79891 Long term (current) use of opiate analgesic: Secondary | ICD-10-CM | POA: Diagnosis not present

## 2018-11-28 DIAGNOSIS — M25512 Pain in left shoulder: Secondary | ICD-10-CM | POA: Diagnosis not present

## 2018-11-28 DIAGNOSIS — M961 Postlaminectomy syndrome, not elsewhere classified: Secondary | ICD-10-CM | POA: Diagnosis not present

## 2018-12-02 DIAGNOSIS — E7800 Pure hypercholesterolemia, unspecified: Secondary | ICD-10-CM | POA: Diagnosis not present

## 2018-12-02 DIAGNOSIS — R972 Elevated prostate specific antigen [PSA]: Secondary | ICD-10-CM | POA: Diagnosis not present

## 2018-12-02 DIAGNOSIS — M859 Disorder of bone density and structure, unspecified: Secondary | ICD-10-CM | POA: Diagnosis not present

## 2018-12-02 DIAGNOSIS — I10 Essential (primary) hypertension: Secondary | ICD-10-CM | POA: Diagnosis not present

## 2018-12-02 DIAGNOSIS — B351 Tinea unguium: Secondary | ICD-10-CM | POA: Diagnosis not present

## 2018-12-02 DIAGNOSIS — F17210 Nicotine dependence, cigarettes, uncomplicated: Secondary | ICD-10-CM | POA: Diagnosis not present

## 2018-12-02 DIAGNOSIS — Z79899 Other long term (current) drug therapy: Secondary | ICD-10-CM | POA: Diagnosis not present

## 2018-12-03 ENCOUNTER — Other Ambulatory Visit: Payer: Self-pay

## 2018-12-03 ENCOUNTER — Telehealth (HOSPITAL_COMMUNITY): Payer: Self-pay | Admitting: Rehabilitation

## 2018-12-03 DIAGNOSIS — I6529 Occlusion and stenosis of unspecified carotid artery: Secondary | ICD-10-CM

## 2018-12-03 DIAGNOSIS — I714 Abdominal aortic aneurysm, without rupture, unspecified: Secondary | ICD-10-CM

## 2018-12-03 NOTE — Telephone Encounter (Signed)

## 2018-12-04 ENCOUNTER — Other Ambulatory Visit: Payer: Self-pay

## 2018-12-04 ENCOUNTER — Ambulatory Visit (INDEPENDENT_AMBULATORY_CARE_PROVIDER_SITE_OTHER)
Admission: RE | Admit: 2018-12-04 | Discharge: 2018-12-04 | Disposition: A | Discharge status: Home / Self Care | Payer: PPO | Source: Ambulatory Visit | Attending: Family | Admitting: Family

## 2018-12-04 ENCOUNTER — Ambulatory Visit (HOSPITAL_COMMUNITY)
Admission: RE | Admit: 2018-12-04 | Discharge: 2018-12-04 | Disposition: A | Discharge status: Home / Self Care | Payer: PPO | Source: Ambulatory Visit | Attending: Family | Admitting: Family

## 2018-12-04 ENCOUNTER — Ambulatory Visit: Payer: PPO | Admitting: Family

## 2018-12-04 ENCOUNTER — Encounter: Payer: Self-pay | Admitting: Family

## 2018-12-04 VITALS — BP 175/93 | HR 76 | Temp 98.9°F | Resp 16 | Ht 68.0 in | Wt 261.0 lb

## 2018-12-04 DIAGNOSIS — I714 Abdominal aortic aneurysm, without rupture, unspecified: Secondary | ICD-10-CM

## 2018-12-04 DIAGNOSIS — I6529 Occlusion and stenosis of unspecified carotid artery: Secondary | ICD-10-CM

## 2018-12-04 DIAGNOSIS — F17290 Nicotine dependence, other tobacco product, uncomplicated: Secondary | ICD-10-CM | POA: Diagnosis not present

## 2018-12-04 DIAGNOSIS — I6523 Occlusion and stenosis of bilateral carotid arteries: Secondary | ICD-10-CM

## 2018-12-04 DIAGNOSIS — Z95828 Presence of other vascular implants and grafts: Secondary | ICD-10-CM

## 2018-12-04 NOTE — Progress Notes (Signed)
[facility] [place]  CC: Follow up s/p Endovascular Repair of Abdominal Aortic Aneurysm    History of Present Illness  Michael Lutz is a 73 y.o. (1945-08-23) male who is s/p Gore Excluder aneurysm stent graft repair in September 2011 by Dr. Oneida Alar.   The patient denies new abdominal or back pain. He has chronic back pain from spine issues.   The patient's atherosclerotic risk factors include former tobacco use, current secondhand smoke exposure, hyperlipidemia, hypertension. These are all currently stable and followed by his primary care physician. His aneurysm diameter was approximately 5 cm preoperatively.   He has had 3 back surgeries for scoliosis.  He takes Lyrica for "nerve problem" in both legs.  Pt states he knows about his cardiac murmur, states he does not see a cardiologist. He denies dyspnea or chest pain.  He fell in August 2020, hyperextended his right wrist, and states he fracture 2 left toes. He is wearing a left stabilization boot and right wrist splint.   He denies any history of TIA or stroke.  Diabetic: No Tobacco use: current cigar smoker, started smoking in 1966   Past Medical History:  Diagnosis Date  . AAA (abdominal aortic aneurysm) ([place])   . ADD (attention deficit disorder)   . Anxiety    takes Clonazepam daily  . Arthritis    left knee  . Bipolar 1 disorder ([place])    takes Seroquel nightly  . Bipolar affective disorder ([place])    in remission on meds  . Blood transfusion    several times  . CHF (congestive heart failure) ([place])   . Chronic back pain    [facility]  . Chronic back pain    scoliosis  . Chronic low back pain 07/31/2013  . Colon polyps    Dr Cristina Gong does a colonoscopy every 5 years  . Constipation    takes Colace prn  . Depression    takes Effexor daily  . Dysrhythmia    palpitations- sees dr Irish Lack  . Enlarged prostate    slightly  . Full dentures   . GERD  (gastroesophageal reflux disease)    takes Zantac daily  . Heart murmur    arrythmia  . History of shingles   . Hx of scabies Mid Feb 2013  . Hx of seasonal allergies   . Hyperlipidemia    takes Niacin daily and Lipitor daily  . Hypertension    takes Amlodipine and Quinapril daily  . Insomnia    takes Trazodone nightly  . Joint pain   . Leg pain 05-04-10   with walking  . Memory loss    short and long term  . Nocturia   . Peripheral edema    takes Furosemide prn  . Peripheral vascular disease ([place])    s/p AAA stent  . Urinary urgency   . Wears glasses    Past Surgical History:  Procedure Laterality Date  . ABDOMINAL AORTIC ANEURYSM REPAIR  11/2009   Stent graft repair  . BACK SURGERY     thoracic spine  . CARDIAC CATHETERIZATION     15 yrs ago  . CHOLECYSTECTOMY    . COLONOSCOPY    . FOOT SURGERY     Multiple surgeries on right foot  . GANGLION CYST EXCISION Right 06/07/2012   Procedure: DISTAL POLE EXCISION RIGHT WRIST;  Surgeon: Wynonia Sours, MD;  Location: [place];  Service: Orthopedics;  Laterality: Right;  . SHOULDER SURGERY  Right shoulder  . SPINAL CORD STIMULATOR IMPLANT  2009  . SPINAL FUSION  04/04/2011   Procedure: FUSION POSTERIOR SPINAL MULTILEVEL/SCOLIOSIS;  Surgeon: Gunnar Bulla, MD;  Location: [place];  Service: Orthopedics;  Laterality: N/A;  repair thoracic and lumbar  pseudoarthrosis, revise spinal cord stimulator, extend thoracic fusion, iliac crest bone graft  . SPINE SURGERY  12/2009   Spinal Fusion by Dr. Marcial Pacas  . TESTICLE SURGERY     right testicle  . TONSILLECTOMY    . TURP VAPORIZATION    . VASCULAR SURGERY     AAA stent repair   Social History Social History   Tobacco Use  . Smoking status: Former Smoker    Packs/day: 1.00    Years: 50.00    Pack years: 50.00    Types: Cigarettes    Quit date: 10/08/2013    Years since quitting: 5.1  . Smokeless tobacco: Never Used  . Tobacco comment: states smokes "little  cigars" - former  Substance Use Topics  . Alcohol use: No    Alcohol/week: 0.0 standard drinks  . Drug use: No   Family History Family History  Problem Relation Age of Onset  . Cancer Mother        melanoma  . Diabetes Father   . Heart attack Father   . Arthritis Sister        rheumatoid  . Cancer Sister   . Anesthesia problems Neg Hx   . Hypotension Neg Hx   . Malignant hyperthermia Neg Hx   . Pseudochol deficiency Neg Hx    Current Outpatient Medications on File Prior to Visit  Medication Sig Dispense Refill  . amLODipine (NORVASC) 10 MG tablet Take 10 mg by mouth daily.     . Ascorbic Acid (VITAMIN C) 1000 MG tablet Take 500 mg by mouth daily.     Marland Kitchen aspirin 325 MG tablet Take 162 mg by mouth daily.     Marland Kitchen atorvastatin (LIPITOR) 80 MG tablet Take 80 mg by mouth daily.    Marland Kitchen b complex vitamins tablet Take 1 tablet by mouth every morning.    . calcium carbonate (OS-CAL) 600 MG TABS Take 600 mg by mouth daily.      . cholecalciferol (VITAMIN D) 1000 UNITS tablet Take 2,000 Units by mouth every morning.     . clonazePAM (KLONOPIN) 0.5 MG tablet Take 0.5 mg by mouth every 8 (eight) hours as needed for anxiety.     . diphenhydrAMINE (BENADRYL) 25 mg capsule Take 25 mg by mouth every 6 (six) hours as needed.    . doxepin (SINEQUAN) 25 MG capsule Take 1 capsule (25 mg total) by mouth at bedtime.    . fentaNYL (DURAGESIC - DOSED MCG/HR) 100 MCG/HR Place 100 mcg onto the skin every other day.     . furosemide (LASIX) 40 MG tablet Take 80 mg by mouth as needed. For fluid retention    . ibuprofen (ADVIL,MOTRIN) 200 MG tablet Take 200 mg by mouth every 6 (six) hours as needed. For pain    . magnesium oxide (MAG-OX) 400 MG tablet Take 400 mg by mouth daily.      . methocarbamol (ROBAXIN) 500 MG tablet Take 500 mg by mouth every 8 (eight) hours as needed for muscle spasms.    . Multiple Vitamin (MULTIVITAMIN) tablet Take 1 tablet by mouth daily.      . Potassium (GNP POTASSIUM) 99 MG TABS      . pregabalin (LYRICA) 100 MG capsule Take  1 capsule (100 mg total) by mouth 2 (two) times daily. (Patient taking differently: Take 150 mg by mouth 2 (two) times daily. ) 60 capsule 0  . QUEtiapine (SEROQUEL) 100 MG tablet Take 1 tablet (100 mg total) by mouth at bedtime.    . quinapril (ACCUPRIL) 40 MG tablet Take 80 mg by mouth daily.     . ranitidine (ZANTAC) 75 MG tablet Take 75 mg by mouth daily as needed for heartburn.    . RESTASIS 0.05 % ophthalmic emulsion Place 1 drop into both eyes 2 (two) times daily.     . SYMBICORT 80-4.5 MCG/ACT inhaler TAKE 2 PUFFS BY MOUTH TWICE A DAY 10.2 Inhaler 0  . SYMBICORT 80-4.5 MCG/ACT inhaler TAKE 2 PUFFS BY MOUTH TWICE A DAY 10.2 g 0  . tamsulosin (FLOMAX) 0.4 MG CAPS capsule Take 0.4 mg by mouth daily.    . traZODone (DESYREL) 100 MG tablet Take 1 tablet (100 mg total) by mouth at bedtime.    Marland Kitchen venlafaxine XR (EFFEXOR-XR) 75 MG 24 hr capsule Take 1 capsule (75 mg total) by mouth daily with breakfast.    . vitamin E 1000 UNIT capsule Take 1,000 Units by mouth daily.     No current facility-administered medications on file prior to visit.    No Known Allergies   ROS: See HPI for pertinent positives and negatives.  Physical Examination  Vitals:   12/04/18 1411  BP: (!) 175/93  Pulse: 76  Resp: 16  Temp: 98.9 F (37.2 C)  TempSrc: Temporal  SpO2: 95%  Weight: 261 lb (118.4 kg)  Height: 5\' 8"  (1.727 m)   Body mass index is 39.68 kg/m.  General: A&O x 3, WD, obese male. Ambulates with quad cane HEENT: No gross abnormalities  Pulmonary: Sym exp, respirations are non labored, fiar air movement in all fields, no rales, rhonchi, or wheezes Cardiac: Regular rhythm and rate, no murmur appreciated  Vascular: Vessel Right Left  Radial 3+Palpable 1+Palpable  Carotid  without bruit  without bruit  Aorta Not palpable N/A  Femoral Not Palpable seated, obese Not Palpable  Popliteal Not palpable Not palpable  PT Not Palpable Stabilization boot  in place  DP Not Palpable Stabilization boot in place   Gastrointestinal: soft, NTND, -G/R, - HSM, - palpable masses, - CVAT B. Musculoskeletal: M/S 5/5 throughout, extremities without ischemic changes. Splint in place right wrist, stabilization boot in place left foot Skin: No rashes, no ulcers, no cellulitis.   Neurologic: Pain and light touch intact in extremities, Motor exam as listed above. CN 2-12 grossly intact except some hearing loss Psychiatric: Normal thought content, mood appropriate for clinical situation.    DATA  Carotid Duplex (12-04-18): Right Carotid: Velocities in the right ICA are consistent with a 1-39% stenosis. Left Carotid: Velocities in the left ICA are consistent with a 1-39% stenosis. Vertebrals:  Bilateral vertebral arteries demonstrate antegrade flow. Subclavians: Normal flow hemodynamics were seen in bilateral subclavian arteries.  EVAR Duplex   Previous (Date: 04-09-16) EVAR Duplex  Technically difficult exam due to overlying bowel gas.   AAA sac size: 3.0 cm; right CIA: 1.8 cm; Left CIA: 1.6 cm (not well visualized)  no endoleak detected   Same as CTA in 2015  Previous duplex performed on 01-22-2015 not valid due to limited visualization    Current (12-04-18): Endovascular Aortic Repair (EVAR): +----------+----------------+-------------------+-------------------+           Diameter AP (cm)Diameter Trans (cm)Velocities (cm/sec) +----------+----------------+-------------------+-------------------+ Aorta     2.80  2.40               46                  +----------+----------------+-------------------+-------------------+ Right Limb1.39            1.63               58                  +----------+----------------+-------------------+-------------------+ Left Limb 1.54            1.65               67                  +----------+----------------+-------------------+-------------------+ Summary: IVC/Iliac: Patent  endovascular abdominal aorta with a maximum diameter of 2.8 cms, limited visability due to body habitus.   CTA Abd/Pelvis Duplex (Date: 01-08-14) Pelvis: Tortuous atherosclerotic iliac vessels. Stent graft terminates in the common iliac arteries bilaterally. No iliac aneurysm or occlusive process. Visualized common femoral, proximal profunda femoral, superficial femoral arteries are patent. No pelvic free fluid, fluid collection, hemorrhage, abscess or adenopathy. No inguinal abnormality or significant hernia. Urinary bladder unremarkable. Postop changes of the lumbar spine and pelvis. IMPRESSION: Patent aortic stent graft repair of the previous infrarenal abdominal aortic aneurysm. Interval complete retraction of the native aneurysm sac around the stent graft. No residual measurable native aneurysm demonstrated. Negative for endoleak or retroperitoneal abnormality.    Medical Decision Making  Michael Lutz is a 73 y.o. male who is s/p Gore Excluder aneurysm stent graft repair in September 2011 by Dr. Oneida Alar.    Pt is asymptomatic with stable sac size (no sac).  I discussed with the patient the importance of surveillance of the endograft.  The next endograft duplex will be scheduled for 12 months. Will also check ABI's, asymptomatic non palpable pedal pulses.   The patient will follow up with [place] in 12 months with these studies.  Carotid duplex in 3 years.   Over 3 minutes was spent counseling patient re smoking cessation, and patient was given several free resources re smoking cessation.  I emphasized the importance of maximal medical management including strict control of blood pressure, blood glucose, and lipid levels, antiplatelet agents, obtaining regular exercise, and cessation of smoking.   Thank you for allowing [place] to participate in this patient's care.  Clemon Chambers, RN, MSN, FNP-C [facility] [place] Office: [phone]  Clinic  Physician: Laqueta Due  12/04/2018, 2:14 PM

## 2018-12-04 NOTE — Patient Instructions (Addendum)
Before your next abdominal ultrasound:  Avoid gas forming foods and beverages the day before the test.   Take two Extra-Strength Gas-X capsules at bedtime the night before the test. Take another two Extra-Strength Gas-X capsules in the middle of the night if you get up to the restroom, if not, first thing in the morning with water.  Do not chew gum.     Steps to Quit Smoking Smoking tobacco is the leading cause of preventable death. It can affect almost every organ in the body. Smoking puts you and people around you at risk for many serious, long-lasting (chronic) diseases. Quitting smoking can be hard, but it is one of the best things that you can do for your health. It is never too late to quit. How do I get ready to quit? When you decide to quit smoking, make a plan to help you succeed. Before you quit:  Pick a date to quit. Set a date within the next 2 weeks to give you time to prepare.  Write down the reasons why you are quitting. Keep this list in places where you will see it often.  Tell your family, friends, and co-workers that you are quitting. Their support is important.  Talk with your doctor about the choices that may help you quit.  Find out if your health insurance will pay for these treatments.  Know the people, places, things, and activities that make you want to smoke (triggers). Avoid them. What first steps can I take to quit smoking?  Throw away all cigarettes at home, at work, and in your car.  Throw away the things that you use when you smoke, such as ashtrays and lighters.  Clean your car. Make sure to empty the ashtray.  Clean your home, including curtains and carpets. What can I do to help me quit smoking? Talk with your doctor about taking medicines and seeing a counselor at the same time. You are more likely to succeed when you do both.  If you are pregnant or breastfeeding, talk with your doctor about counseling or other ways to quit smoking. Do not  take medicine to help you quit smoking unless your doctor tells you to do so. To quit smoking: Quit right away  Quit smoking totally, instead of slowly cutting back on how much you smoke over a period of time.  Go to counseling. You are more likely to quit if you go to counseling sessions regularly. Take medicine You may take medicines to help you quit. Some medicines need a prescription, and some you can buy over-the-counter. Some medicines may contain a drug called nicotine to replace the nicotine in cigarettes. Medicines may:  Help you to stop having the desire to smoke (cravings).  Help to stop the problems that come when you stop smoking (withdrawal symptoms). Your doctor may ask you to use:  Nicotine patches, gum, or lozenges.  Nicotine inhalers or sprays.  Non-nicotine medicine that is taken by mouth. Find resources Find resources and other ways to help you quit smoking and remain smoke-free after you quit. These resources are most helpful when you use them often. They include:  Online chats with a counselor.  Phone quitlines.  Printed self-help materials.  Support groups or group counseling.  Text messaging programs.  Mobile phone apps. Use apps on your mobile phone or tablet that can help you stick to your quit plan. There are many free apps for mobile phones and tablets as well as websites. Examples include Quit   Guide from the CDC and smokefree.gov  What things can I do to make it easier to quit?   Talk to your family and friends. Ask them to support and encourage you.  Call a phone quitline (1-800-QUIT-NOW), reach out to support groups, or work with a counselor.  Ask people who smoke to not smoke around you.  Avoid places that make you want to smoke, such as: ? Bars. ? Parties. ? Smoke-break areas at work.  Spend time with people who do not smoke.  Lower the stress in your life. Stress can make you want to smoke. Try these things to help your  stress: ? Getting regular exercise. ? Doing deep-breathing exercises. ? Doing yoga. ? Meditating. ? Doing a body scan. To do this, close your eyes, focus on one area of your body at a time from head to toe. Notice which parts of your body are tense. Try to relax the muscles in those areas. How will I feel when I quit smoking? Day 1 to 3 weeks Within the first 24 hours, you may start to have some problems that come from quitting tobacco. These problems are very bad 2-3 days after you quit, but they do not often last for more than 2-3 weeks. You may get these symptoms:  Mood swings.  Feeling restless, nervous, angry, or annoyed.  Trouble concentrating.  Dizziness.  Strong desire for high-sugar foods and nicotine.  Weight gain.  Trouble pooping (constipation).  Feeling like you may vomit (nausea).  Coughing or a sore throat.  Changes in how the medicines that you take for other issues work in your body.  Depression.  Trouble sleeping (insomnia). Week 3 and afterward After the first 2-3 weeks of quitting, you may start to notice more positive results, such as:  Better sense of smell and taste.  Less coughing and sore throat.  Slower heart rate.  Lower blood pressure.  Clearer skin.  Better breathing.  Fewer sick days. Quitting smoking can be hard. Do not give up if you fail the first time. Some people need to try a few times before they succeed. Do your best to stick to your quit plan, and talk with your doctor if you have any questions or concerns. Summary  Smoking tobacco is the leading cause of preventable death. Quitting smoking can be hard, but it is one of the best things that you can do for your health.  When you decide to quit smoking, make a plan to help you succeed.  Quit smoking right away, not slowly over a period of time.  When you start quitting, seek help from your doctor, family, or friends. This information is not intended to replace advice  given to you by your health care provider. Make sure you discuss any questions you have with your health care provider. Document Released: 12/24/2008 Document Revised: 05/17/2018 Document Reviewed: 05/18/2018 Elsevier Patient Education  2020 Elsevier Inc.    

## 2018-12-18 DIAGNOSIS — M79672 Pain in left foot: Secondary | ICD-10-CM | POA: Diagnosis not present

## 2018-12-18 DIAGNOSIS — I739 Peripheral vascular disease, unspecified: Secondary | ICD-10-CM | POA: Diagnosis not present

## 2018-12-18 DIAGNOSIS — M19071 Primary osteoarthritis, right ankle and foot: Secondary | ICD-10-CM | POA: Diagnosis not present

## 2018-12-18 DIAGNOSIS — M5136 Other intervertebral disc degeneration, lumbar region: Secondary | ICD-10-CM | POA: Diagnosis not present

## 2018-12-19 DIAGNOSIS — M654 Radial styloid tenosynovitis [de Quervain]: Secondary | ICD-10-CM | POA: Diagnosis not present

## 2018-12-24 ENCOUNTER — Other Ambulatory Visit: Payer: Self-pay | Admitting: Family Medicine

## 2018-12-24 DIAGNOSIS — I739 Peripheral vascular disease, unspecified: Secondary | ICD-10-CM

## 2018-12-25 DIAGNOSIS — Z20828 Contact with and (suspected) exposure to other viral communicable diseases: Secondary | ICD-10-CM | POA: Diagnosis not present

## 2019-01-20 DIAGNOSIS — Z79899 Other long term (current) drug therapy: Secondary | ICD-10-CM | POA: Diagnosis not present

## 2019-01-27 DIAGNOSIS — M961 Postlaminectomy syndrome, not elsewhere classified: Secondary | ICD-10-CM | POA: Diagnosis not present

## 2019-01-27 DIAGNOSIS — Z79891 Long term (current) use of opiate analgesic: Secondary | ICD-10-CM | POA: Diagnosis not present

## 2019-01-27 DIAGNOSIS — G894 Chronic pain syndrome: Secondary | ICD-10-CM | POA: Diagnosis not present

## 2019-01-27 DIAGNOSIS — M25512 Pain in left shoulder: Secondary | ICD-10-CM | POA: Diagnosis not present

## 2019-02-13 DIAGNOSIS — F3111 Bipolar disorder, current episode manic without psychotic features, mild: Secondary | ICD-10-CM | POA: Diagnosis not present

## 2019-02-24 DIAGNOSIS — M25512 Pain in left shoulder: Secondary | ICD-10-CM | POA: Diagnosis not present

## 2019-02-24 DIAGNOSIS — Z79891 Long term (current) use of opiate analgesic: Secondary | ICD-10-CM | POA: Diagnosis not present

## 2019-02-24 DIAGNOSIS — G894 Chronic pain syndrome: Secondary | ICD-10-CM | POA: Diagnosis not present

## 2019-02-24 DIAGNOSIS — M961 Postlaminectomy syndrome, not elsewhere classified: Secondary | ICD-10-CM | POA: Diagnosis not present

## 2019-04-02 DIAGNOSIS — N401 Enlarged prostate with lower urinary tract symptoms: Secondary | ICD-10-CM | POA: Diagnosis not present

## 2019-04-02 DIAGNOSIS — R3915 Urgency of urination: Secondary | ICD-10-CM | POA: Diagnosis not present

## 2019-04-02 DIAGNOSIS — R972 Elevated prostate specific antigen [PSA]: Secondary | ICD-10-CM | POA: Diagnosis not present

## 2019-04-02 DIAGNOSIS — R3121 Asymptomatic microscopic hematuria: Secondary | ICD-10-CM | POA: Diagnosis not present

## 2019-04-08 ENCOUNTER — Ambulatory Visit (HOSPITAL_COMMUNITY): Admission: RE | Admit: 2019-04-08 | Payer: PPO | Source: Ambulatory Visit

## 2019-04-24 DIAGNOSIS — Z79891 Long term (current) use of opiate analgesic: Secondary | ICD-10-CM | POA: Diagnosis not present

## 2019-04-24 DIAGNOSIS — M25512 Pain in left shoulder: Secondary | ICD-10-CM | POA: Diagnosis not present

## 2019-04-24 DIAGNOSIS — M961 Postlaminectomy syndrome, not elsewhere classified: Secondary | ICD-10-CM | POA: Diagnosis not present

## 2019-04-24 DIAGNOSIS — G894 Chronic pain syndrome: Secondary | ICD-10-CM | POA: Diagnosis not present

## 2019-05-05 DIAGNOSIS — F3111 Bipolar disorder, current episode manic without psychotic features, mild: Secondary | ICD-10-CM | POA: Diagnosis not present

## 2019-05-21 ENCOUNTER — Emergency Department (HOSPITAL_COMMUNITY): Admission: EM | Admit: 2019-05-21 | Discharge: 2019-05-21 | Discharge status: Absent without leave | Payer: PPO

## 2019-05-21 ENCOUNTER — Ambulatory Visit (HOSPITAL_COMMUNITY)
Admission: RE | Admit: 2019-05-21 | Discharge: 2019-05-21 | Disposition: A | Discharge status: Home / Self Care | Payer: PPO | Source: Ambulatory Visit | Attending: Family Medicine | Admitting: Family Medicine

## 2019-05-21 ENCOUNTER — Other Ambulatory Visit: Payer: Self-pay

## 2019-05-21 DIAGNOSIS — I739 Peripheral vascular disease, unspecified: Secondary | ICD-10-CM | POA: Diagnosis not present

## 2019-05-21 NOTE — Progress Notes (Signed)
ABI completed. Refer to "CV Proc" under chart review to view preliminary results.  05/21/2019 2:38 PM Kelby Aline., MHA, RVT, RDCS, RDMS

## 2019-05-28 DIAGNOSIS — C61 Malignant neoplasm of prostate: Secondary | ICD-10-CM | POA: Diagnosis not present

## 2019-05-28 DIAGNOSIS — R972 Elevated prostate specific antigen [PSA]: Secondary | ICD-10-CM | POA: Diagnosis not present

## 2019-06-04 DIAGNOSIS — I2510 Atherosclerotic heart disease of native coronary artery without angina pectoris: Secondary | ICD-10-CM | POA: Diagnosis not present

## 2019-06-04 DIAGNOSIS — N401 Enlarged prostate with lower urinary tract symptoms: Secondary | ICD-10-CM | POA: Diagnosis not present

## 2019-06-04 DIAGNOSIS — C61 Malignant neoplasm of prostate: Secondary | ICD-10-CM | POA: Diagnosis not present

## 2019-06-04 DIAGNOSIS — F319 Bipolar disorder, unspecified: Secondary | ICD-10-CM | POA: Diagnosis not present

## 2019-06-04 DIAGNOSIS — I10 Essential (primary) hypertension: Secondary | ICD-10-CM | POA: Diagnosis not present

## 2019-06-04 DIAGNOSIS — J439 Emphysema, unspecified: Secondary | ICD-10-CM | POA: Diagnosis not present

## 2019-06-04 DIAGNOSIS — M8580 Other specified disorders of bone density and structure, unspecified site: Secondary | ICD-10-CM | POA: Diagnosis not present

## 2019-06-04 DIAGNOSIS — E7800 Pure hypercholesterolemia, unspecified: Secondary | ICD-10-CM | POA: Diagnosis not present

## 2019-06-19 DIAGNOSIS — Z79891 Long term (current) use of opiate analgesic: Secondary | ICD-10-CM | POA: Diagnosis not present

## 2019-06-19 DIAGNOSIS — M961 Postlaminectomy syndrome, not elsewhere classified: Secondary | ICD-10-CM | POA: Diagnosis not present

## 2019-06-19 DIAGNOSIS — G894 Chronic pain syndrome: Secondary | ICD-10-CM | POA: Diagnosis not present

## 2019-06-19 DIAGNOSIS — M25512 Pain in left shoulder: Secondary | ICD-10-CM | POA: Diagnosis not present

## 2019-06-23 NOTE — Progress Notes (Signed)
GU Location of Tumor / Histology: prostatic adenocarcinoma  If Prostate Cancer, Gleason Score is (4 + 3) and PSA is (7.76) on 04/03/2019. Prostate volume: 41.39 grams.  Rashawd Hildebrandt Niebla presented in April 2009, PSA was below 2. He has had an up and down trend in his PSA before. His father has a hx of prostate ca.  Biopsies of prostate (if applicable) revealed:    Past/Anticipated interventions by urology, if any: prostate biopsy, referral for consideration of external beam radiotherapy  Past/Anticipated interventions by medical oncology, if any: no  Weight changes, if any: denies  Bowel/Bladder complaints, if any: IPSS 7. SHIM 2. Denies dysuria or hematuria. Reports a hx of urinary leakage that resolved with flomax but has returned in the last six months.    Nausea/Vomiting, if any: denies  Pain issues, if any:  denies  SAFETY ISSUES:  Prior radiation? denies  Pacemaker/ICD? denies  Possible current pregnancy? no, male patient  Is the patient on methotrexate? denies  Current Complaints / other details:  74 year old male. Single. Retired. Sister with hx of breast ca. Father with hx of prostate ca. Maternal GM with hx of breast ca.

## 2019-06-24 ENCOUNTER — Encounter: Payer: Self-pay | Admitting: Radiation Oncology

## 2019-06-24 ENCOUNTER — Ambulatory Visit
Admission: RE | Admit: 2019-06-24 | Discharge: 2019-06-24 | Disposition: A | Discharge status: Home / Self Care | Payer: PPO | Source: Ambulatory Visit | Attending: Radiation Oncology | Admitting: Radiation Oncology

## 2019-06-24 ENCOUNTER — Other Ambulatory Visit: Payer: Self-pay

## 2019-06-24 VITALS — Ht 68.0 in | Wt 260.0 lb

## 2019-06-24 DIAGNOSIS — R972 Elevated prostate specific antigen [PSA]: Secondary | ICD-10-CM | POA: Diagnosis not present

## 2019-06-24 DIAGNOSIS — C61 Malignant neoplasm of prostate: Secondary | ICD-10-CM

## 2019-06-24 DIAGNOSIS — Z8042 Family history of malignant neoplasm of prostate: Secondary | ICD-10-CM | POA: Diagnosis not present

## 2019-06-24 HISTORY — DX: Malignant neoplasm of prostate: C61

## 2019-06-24 NOTE — Progress Notes (Signed)
See progress note under physician encounter. 

## 2019-06-24 NOTE — Progress Notes (Signed)
Radiation Oncology         [phone] ________________________________  Initial Outpatient Consultation - Conducted via Telephone due to current COVID-19 concerns for limiting patient exposure  Name: Michael Lutz MRN: [id number]  Date: 06/24/2019  DOB: 1946/01/02  [id number], Lattie Haw, MD  Franchot Gallo, MD   REFERRING PHYSICIAN: Franchot Gallo, MD  DIAGNOSIS: 74 y.o. gentleman with Stage T1c adenocarcinoma of the prostate with Gleason score of 4+3, and PSA of 7.76.    ICD-10-CM   1. Malignant neoplasm of prostate ([place])  C61     HISTORY OF PRESENT ILLNESS: Michael Lutz is a 74 y.o. male with a diagnosis of prostate cancer. He is an established patient with [facility] for history of BPH with BOO and s/p TURP in 07/1996.  He developed recurrent symptoms of obstruction and microscopic hematuria noted in 2018 and was started on Flomax which he has continued taking daily and has had good control of LUTS since that time.  He has a history of fluctuating PSA over the years with an average PSA between 4-5. His last PSA in 10/2016 was 4.02. More recently, he was referred back to Dr. Diona Fanti on 08/21/2018 after his PCP, Dr. Sabra Heck, noted an elevated PSA of 11.8 on 07/12/2018. A repeat PSA performed at that visit had decreased to 7.43 and digital rectal exam remained without concerning findings.  The decision was to continue to monitor the PSA and a follow up on 04/03/2019 remained elevated at 7.76. The patient proceeded to transrectal ultrasound with 12 biopsies of the prostate on 05/28/2019.  The prostate volume measured 41.39 cc.  Out of 12 core biopsies, 2 were positive.  The maximum Gleason score was 4+3, and this was seen in the right mid. Gleason 3+4 was seen in the right mid lateral  The patient reviewed the biopsy results with his urologist and he has kindly been referred today for discussion of potential radiation treatment options.  PREVIOUS RADIATION THERAPY: No  PAST MEDICAL  HISTORY:  Past Medical History:  Diagnosis Date  . AAA (abdominal aortic aneurysm) ([place])   . ADD (attention deficit disorder)   . Anxiety    takes Clonazepam daily  . Arthritis    left knee  . Bipolar 1 disorder ([place])    takes Seroquel nightly  . Bipolar affective disorder ([place])    in remission on meds  . Blood transfusion    several times  . CHF (congestive heart failure) ([place])   . Chronic back pain    [facility]  . Chronic back pain    scoliosis  . Chronic low back pain 07/31/2013  . Colon polyps    Dr Cristina Gong does a colonoscopy every 5 years  . Constipation    takes Colace prn  . Depression    takes Effexor daily  . Dysrhythmia    palpitations- sees dr Irish Lack  . Enlarged prostate    slightly  . Full dentures   . GERD (gastroesophageal reflux disease)    takes Zantac daily  . Heart murmur    arrythmia  . History of shingles   . Hx of scabies Mid Feb 2013  . Hx of seasonal allergies   . Hyperlipidemia    takes Niacin daily and Lipitor daily  . Hypertension    takes Amlodipine and Quinapril daily  . Insomnia    takes Trazodone nightly  . Joint pain   . Leg pain 05-04-10   with walking  . Memory loss  short and long term  . Nocturia   . Peripheral edema    takes Furosemide prn  . Peripheral vascular disease ([place])    s/p AAA stent  . Prostate cancer ([place])   . Urinary urgency   . Wears glasses       PAST SURGICAL HISTORY: Past Surgical History:  Procedure Laterality Date  . ABDOMINAL AORTIC ANEURYSM REPAIR  11/2009   Stent graft repair  . BACK SURGERY     thoracic spine  . CARDIAC CATHETERIZATION     15 yrs ago  . CHOLECYSTECTOMY    . COLONOSCOPY    . FOOT SURGERY     Multiple surgeries on right foot  . GANGLION CYST EXCISION Right 06/07/2012   Procedure: DISTAL POLE EXCISION RIGHT WRIST;  Surgeon: Wynonia Sours, MD;  Location: [place];  Service: Orthopedics;  Laterality: Right;  . SHOULDER SURGERY      Right shoulder  . SPINAL CORD STIMULATOR IMPLANT  2009  . SPINAL FUSION  04/04/2011   Procedure: FUSION POSTERIOR SPINAL MULTILEVEL/SCOLIOSIS;  Surgeon: Gunnar Bulla, MD;  Location: [place];  Service: Orthopedics;  Laterality: N/A;  repair thoracic and lumbar  pseudoarthrosis, revise spinal cord stimulator, extend thoracic fusion, iliac crest bone graft  . SPINE SURGERY  12/2009   Spinal Fusion by Dr. Marcial Pacas  . TESTICLE SURGERY     right testicle  . TONSILLECTOMY    . TURP VAPORIZATION    . VASCULAR SURGERY     AAA stent repair    FAMILY HISTORY:  Family History  Problem Relation Age of Onset  . Melanoma Mother   . Diabetes Father   . Heart attack Father   . Prostate cancer Father   . Arthritis Sister        rheumatoid  . Breast cancer Sister   . Breast cancer Maternal Grandmother   . Anesthesia problems Neg Hx   . Hypotension Neg Hx   . Malignant hyperthermia Neg Hx   . Pseudochol deficiency Neg Hx   . Pancreatic cancer Neg Hx   . Colon cancer Neg Hx     SOCIAL HISTORY:  Social History   Socioeconomic History  . Marital status: Single    Spouse name: Not on file  . Number of children: Not on file  . Years of education: Not on file  . Highest education level: Not on file  Occupational History  . Occupation: Retired  Tobacco Use  . Smoking status: Former Smoker    Packs/day: 1.00    Years: 50.00    Pack years: 50.00    Types: Cigarettes    Quit date: 10/08/2013    Years since quitting: 5.7  . Smokeless tobacco: Never Used  . Tobacco comment: states smokes "little cigars" - former  Substance and Sexual Activity  . Alcohol use: No    Alcohol/week: 0.0 standard drinks  . Drug use: No  . Sexual activity: Not Currently  Other Topics Concern  . Not on file  Social History Narrative  . Not on file   Social Determinants of Health   Financial Resource Strain:   . Difficulty of Paying Living Expenses:   Food Insecurity:   . Worried About Charity fundraiser in  the Last Year:   . Arboriculturist in the Last Year:   Transportation Needs:   . Film/video editor (Medical):   Marland Kitchen Lack of Transportation (Non-Medical):   Physical Activity:   . Days of  Exercise per Week:   . Minutes of Exercise per Session:   Stress:   . Feeling of Stress :   Social Connections:   . Frequency of Communication with Friends and Family:   . Frequency of Social Gatherings with Friends and Family:   . Attends Religious Services:   . Active Member of Clubs or Organizations:   . Attends Archivist Meetings:   Marland Kitchen Marital Status:   Intimate Partner Violence:   . Fear of Current or Ex-Partner:   . Emotionally Abused:   Marland Kitchen Physically Abused:   . Sexually Abused:     ALLERGIES: Patient has no known allergies.  MEDICATIONS:  Current Outpatient Medications  Medication Sig Dispense Refill  . amLODipine (NORVASC) 10 MG tablet Take 10 mg by mouth daily.     . Ascorbic Acid (VITAMIN C) 1000 MG tablet Take 500 mg by mouth daily.     Marland Kitchen aspirin 325 MG tablet Take 162 mg by mouth daily.     Marland Kitchen atorvastatin (LIPITOR) 80 MG tablet Take 80 mg by mouth daily.    Marland Kitchen b complex vitamins tablet Take 1 tablet by mouth every morning.    . calcium carbonate (OS-CAL) 600 MG TABS Take 600 mg by mouth daily.      . cholecalciferol (VITAMIN D) 1000 UNITS tablet Take 2,000 Units by mouth every morning.     . clonazePAM (KLONOPIN) 0.5 MG tablet Take 0.5 mg by mouth every 8 (eight) hours as needed for anxiety.     . diphenhydrAMINE (BENADRYL) 25 mg capsule Take 25 mg by mouth every 6 (six) hours as needed.    . fentaNYL (DURAGESIC - DOSED MCG/HR) 100 MCG/HR Place 75 mcg onto the skin every other day.     . furosemide (LASIX) 40 MG tablet Take 80 mg by mouth as needed. For fluid retention    . ibuprofen (ADVIL,MOTRIN) 200 MG tablet Take 200 mg by mouth every 6 (six) hours as needed. For pain    . magnesium oxide (MAG-OX) 400 MG tablet Take 400 mg by mouth daily.      . methocarbamol  (ROBAXIN) 500 MG tablet Take 500 mg by mouth every 8 (eight) hours as needed for muscle spasms.    . Multiple Vitamin (MULTIVITAMIN) tablet Take 1 tablet by mouth daily.      . Potassium (GNP POTASSIUM) 99 MG TABS     . pregabalin (LYRICA) 100 MG capsule Take 1 capsule (100 mg total) by mouth 2 (two) times daily. (Patient taking differently: Take 150 mg by mouth 2 (two) times daily. ) 60 capsule 0  . QUEtiapine (SEROQUEL) 100 MG tablet Take 1 tablet (100 mg total) by mouth at bedtime.    . quinapril (ACCUPRIL) 40 MG tablet Take 80 mg by mouth daily.     . ranitidine (ZANTAC) 75 MG tablet Take 75 mg by mouth daily as needed for heartburn.    . RESTASIS 0.05 % ophthalmic emulsion Place 1 drop into both eyes 2 (two) times daily.     . tamsulosin (FLOMAX) 0.4 MG CAPS capsule Take 0.4 mg by mouth daily.    . traZODone (DESYREL) 100 MG tablet Take 1 tablet (100 mg total) by mouth at bedtime.    Marland Kitchen venlafaxine XR (EFFEXOR-XR) 75 MG 24 hr capsule Take 1 capsule (75 mg total) by mouth daily with breakfast.    . vitamin E 1000 UNIT capsule Take 1,000 Units by mouth daily.     No current facility-administered medications  for this encounter.    REVIEW OF SYSTEMS:  On review of systems, the patient reports that he is doing well overall. He denies any chest pain, shortness of breath, cough, fevers, chills, night sweats, unintended weight changes. He denies any bowel disturbances, and denies abdominal pain, nausea or vomiting. He denies any new musculoskeletal or joint aches or pains. His IPSS was 7, indicating mild urinary symptoms. He reports a history of urinary leakage that had previously resolved with flomax but returned in the last 6 months. His SHIM was 2, indicating he has severe erectile dysfunction. A complete review of systems is obtained and is otherwise negative.    PHYSICAL EXAM:  Wt Readings from Last 3 Encounters:  06/24/19 260 lb (117.9 kg)  12/04/18 261 lb (118.4 kg)  12/13/16 292 lb 9.6 oz  (132.7 kg)   Temp Readings from Last 3 Encounters:  12/04/18 98.9 F (37.2 C) (Temporal)  06/05/16 97.8 F (36.6 C)  04/10/16 97.5 F (36.4 C) (Oral)   BP Readings from Last 3 Encounters:  12/04/18 (!) 175/93  12/13/16 138/90  08/22/16 114/74   Pulse Readings from Last 3 Encounters:  12/04/18 76  12/13/16 74  08/22/16 61   Pain Assessment Pain Score: 0-No pain/10  Physical exam not performed in light of telephone consult visit format.   KPS = 90  100 - Normal; no complaints; no evidence of disease. 90   - Able to carry on normal activity; minor signs or symptoms of disease. 80   - Normal activity with effort; some signs or symptoms of disease. 47   - Cares for self; unable to carry on normal activity or to do active work. 60   - Requires occasional assistance, but is able to care for most of his personal needs. 50   - Requires considerable assistance and frequent medical care. 1   - Disabled; requires special care and assistance. 53   - Severely disabled; hospital admission is indicated although death not imminent. 34   - Very sick; hospital admission necessary; active supportive treatment necessary. 10   - Moribund; fatal processes progressing rapidly. 0     - Dead  Karnofsky DA, Abelmann [place], Craver LS and Burchenal [facility] [phone]) The use of the nitrogen mustards in the palliative treatment of carcinoma: with particular reference to bronchogenic carcinoma Cancer 1 634-56  LABORATORY DATA:  Lab Results  Component Value Date   WBC 9.1 06/04/2016   HGB 15.1 06/04/2016   HCT 45.9 06/04/2016   MCV 86.3 06/04/2016   PLT 116 (L) 06/04/2016   Lab Results  Component Value Date   NA 139 06/04/2016   K 3.9 06/04/2016   CL 104 06/04/2016   CO2 27 06/04/2016   Lab Results  Component Value Date   ALT 28 06/04/2016   AST 31 06/04/2016   ALKPHOS 67 06/04/2016   BILITOT 0.8 06/04/2016     RADIOGRAPHY: No results found.    IMPRESSION/PLAN: This visit was conducted via  Telephone to spare the patient unnecessary potential exposure in the healthcare setting during the current COVID-19 pandemic. 1. 74 y.o. gentleman with Stage T1c adenocarcinoma of the prostate with Gleason Score of 4+3, and PSA of 7.76. We discussed the patient's workup and outlined the nature of prostate cancer in this setting. The patient's T stage, Gleason's score, and PSA put him into the unfavorable intermediate risk group. Accordingly, he is eligible for a variety of potential treatment options including brachytherapy, 5.5 weeks of external radiation, or  prostatectomy. We discussed the available radiation techniques, and focused on the details and logistics of delivery. The patient is not felt to be an ideal candidate for brachytherapy given his history of obstructive symptoms. We discussed and outlined the risks, benefits, short and long-term effects associated with radiotherapy and compared and contrasted these with prostatectomy. We discussed the role of SpaceOAR in reducing the rectal toxicity associated with radiotherapy.  He was encouraged to ask questions that were answered to his stated satisfaction.  At the end of the conversation, the patient is interested in moving forward with 5.5 weeks of external beam radiotherapy. We will share our discussion with Dr. Diona Fanti and make arrangements for fiducial markers and SpaceOAR gel placement, first available, prior to simulation, to reduce rectal toxicity from radiotherapy. The patient appears to have a good understanding of his disease and our treatment recommendations which are of curative intent and is in agreement with the stated plan.  Therefore, we will move forward with treatment planning accordingly, in anticipation of beginning IMRT in the near future.  Given current concerns for patient exposure during the COVID-19 pandemic, this encounter was conducted via telephone. The patient was notified in advance and was offered a MyChart meeting to  allow for face to face communication but unfortunately reported that he did not have the appropriate resources/technology to support such a visit and instead preferred to proceed with telephone consult. The patient has given verbal consent for this type of encounter. The time spent during this encounter was 60 minutes. The attendants for this meeting include Tyler Pita MD, Ashlyn Bruning PA-C, [place], and patient, Michael Lutz. During the encounter, Tyler Pita MD, Ashlyn Bruning PA-C, and scribe, Wilburn Mylar were located at [place].  Patient, Michael Lutz was located at home.    Nicholos Johns, PA-C    Tyler Pita, MD  [facility] Direct Dial: [phone]  Fax: [phone] .com  Skype  LinkedIn  This document serves as a record of services personally performed by Tyler Pita, MD and Freeman Caldron, PA-C. It was created on their behalf by Wilburn Mylar, a trained medical scribe. The creation of this record is based on the scribe's personal observations and the provider's statements to them. This document has been checked and approved by the attending provider.

## 2019-07-01 ENCOUNTER — Encounter: Payer: Self-pay | Admitting: Medical Oncology

## 2019-07-01 NOTE — Progress Notes (Signed)
Called to introduce myself as the prostate nurse navigator and discuss my role. I was unable to meet him 4/13, when he consulted with Dr. Tammi Klippel. I requested a return call to further discuss my role and see if I can answer questions or be of assistance to him.

## 2019-07-02 DIAGNOSIS — Z79899 Other long term (current) drug therapy: Secondary | ICD-10-CM | POA: Diagnosis not present

## 2019-07-08 ENCOUNTER — Telehealth: Payer: Self-pay | Admitting: *Deleted

## 2019-07-08 NOTE — Telephone Encounter (Signed)
Called patient to ask question, lvm for a return call 

## 2019-07-11 DIAGNOSIS — M8580 Other specified disorders of bone density and structure, unspecified site: Secondary | ICD-10-CM | POA: Diagnosis not present

## 2019-07-11 DIAGNOSIS — C61 Malignant neoplasm of prostate: Secondary | ICD-10-CM | POA: Diagnosis not present

## 2019-07-11 DIAGNOSIS — N401 Enlarged prostate with lower urinary tract symptoms: Secondary | ICD-10-CM | POA: Diagnosis not present

## 2019-07-11 DIAGNOSIS — J439 Emphysema, unspecified: Secondary | ICD-10-CM | POA: Diagnosis not present

## 2019-07-11 DIAGNOSIS — F319 Bipolar disorder, unspecified: Secondary | ICD-10-CM | POA: Diagnosis not present

## 2019-07-11 DIAGNOSIS — E7800 Pure hypercholesterolemia, unspecified: Secondary | ICD-10-CM | POA: Diagnosis not present

## 2019-07-11 DIAGNOSIS — I10 Essential (primary) hypertension: Secondary | ICD-10-CM | POA: Diagnosis not present

## 2019-07-11 DIAGNOSIS — I2510 Atherosclerotic heart disease of native coronary artery without angina pectoris: Secondary | ICD-10-CM | POA: Diagnosis not present

## 2019-07-24 ENCOUNTER — Telehealth: Payer: Self-pay | Admitting: *Deleted

## 2019-07-24 NOTE — Telephone Encounter (Signed)
Called patient to ask question, lvm for a return call 

## 2019-07-28 DIAGNOSIS — F3111 Bipolar disorder, current episode manic without psychotic features, mild: Secondary | ICD-10-CM | POA: Diagnosis not present

## 2019-07-30 ENCOUNTER — Encounter: Payer: Self-pay | Admitting: Urology

## 2019-07-30 ENCOUNTER — Encounter (HOSPITAL_COMMUNITY): Payer: Self-pay | Admitting: Radiology

## 2019-07-30 DIAGNOSIS — Z1211 Encounter for screening for malignant neoplasm of colon: Secondary | ICD-10-CM | POA: Diagnosis not present

## 2019-07-30 NOTE — Progress Notes (Signed)
Patient has a spinal cord stimulator - brand is NOVI and model is [id number]. Per Horris Latino in MRI, this patient can not have an MRI, because of this spinal cord stimulator.  Nicholos Johns, MMS, PA-C [place] at [place]: [phone]  Fax: [phone]

## 2019-08-04 DIAGNOSIS — M8580 Other specified disorders of bone density and structure, unspecified site: Secondary | ICD-10-CM | POA: Diagnosis not present

## 2019-08-04 DIAGNOSIS — J439 Emphysema, unspecified: Secondary | ICD-10-CM | POA: Diagnosis not present

## 2019-08-04 DIAGNOSIS — F319 Bipolar disorder, unspecified: Secondary | ICD-10-CM | POA: Diagnosis not present

## 2019-08-04 DIAGNOSIS — I2510 Atherosclerotic heart disease of native coronary artery without angina pectoris: Secondary | ICD-10-CM | POA: Diagnosis not present

## 2019-08-04 DIAGNOSIS — N401 Enlarged prostate with lower urinary tract symptoms: Secondary | ICD-10-CM | POA: Diagnosis not present

## 2019-08-04 DIAGNOSIS — I10 Essential (primary) hypertension: Secondary | ICD-10-CM | POA: Diagnosis not present

## 2019-08-04 DIAGNOSIS — C61 Malignant neoplasm of prostate: Secondary | ICD-10-CM | POA: Diagnosis not present

## 2019-08-04 DIAGNOSIS — E7800 Pure hypercholesterolemia, unspecified: Secondary | ICD-10-CM | POA: Diagnosis not present

## 2019-08-14 DIAGNOSIS — I10 Essential (primary) hypertension: Secondary | ICD-10-CM | POA: Diagnosis not present

## 2019-08-14 DIAGNOSIS — I2510 Atherosclerotic heart disease of native coronary artery without angina pectoris: Secondary | ICD-10-CM | POA: Diagnosis not present

## 2019-08-14 DIAGNOSIS — E7800 Pure hypercholesterolemia, unspecified: Secondary | ICD-10-CM | POA: Diagnosis not present

## 2019-08-14 DIAGNOSIS — J439 Emphysema, unspecified: Secondary | ICD-10-CM | POA: Diagnosis not present

## 2019-08-14 DIAGNOSIS — C61 Malignant neoplasm of prostate: Secondary | ICD-10-CM | POA: Diagnosis not present

## 2019-08-14 DIAGNOSIS — M8580 Other specified disorders of bone density and structure, unspecified site: Secondary | ICD-10-CM | POA: Diagnosis not present

## 2019-08-14 DIAGNOSIS — F319 Bipolar disorder, unspecified: Secondary | ICD-10-CM | POA: Diagnosis not present

## 2019-08-14 DIAGNOSIS — N401 Enlarged prostate with lower urinary tract symptoms: Secondary | ICD-10-CM | POA: Diagnosis not present

## 2019-08-18 ENCOUNTER — Telehealth: Payer: Self-pay | Admitting: Radiation Oncology

## 2019-08-18 NOTE — Telephone Encounter (Signed)
Received voicemail message from patient requesting this office call-in/prescribe Levaquin and diazepam needed for fiducial marker and spaceoar placement on 08/29/19. Patient requested follow up caller leave a voicemail message. Phoned patient back and left message as requested. Explained Levaquin and diazepam if warranted would come from physician at [facility]. Provided number to [facility]. Reminded patient of CT simulation appt at this office on 09/02/19 at 1000. Provided address to [facility]. Provided direct number for follow up needs or questions.

## 2019-08-19 ENCOUNTER — Telehealth: Payer: Self-pay | Admitting: *Deleted

## 2019-08-19 DIAGNOSIS — Z961 Presence of intraocular lens: Secondary | ICD-10-CM | POA: Diagnosis not present

## 2019-08-19 DIAGNOSIS — H35033 Hypertensive retinopathy, bilateral: Secondary | ICD-10-CM | POA: Diagnosis not present

## 2019-08-19 DIAGNOSIS — H2512 Age-related nuclear cataract, left eye: Secondary | ICD-10-CM | POA: Diagnosis not present

## 2019-08-19 DIAGNOSIS — H40013 Open angle with borderline findings, low risk, bilateral: Secondary | ICD-10-CM | POA: Diagnosis not present

## 2019-08-19 NOTE — Telephone Encounter (Signed)
RETURNED PATIENT'S PHONE CALL, SPOKE WITH PATIENT. ?

## 2019-08-25 ENCOUNTER — Telehealth: Payer: Self-pay | Admitting: *Deleted

## 2019-08-25 NOTE — Telephone Encounter (Signed)
Called patient to inform that an MRI will not be needed due to his spinal cord stimulator, lvm for a return call

## 2019-08-26 ENCOUNTER — Encounter: Payer: Self-pay | Admitting: Urology

## 2019-08-26 NOTE — Progress Notes (Addendum)
Per MRI tech, this patient cannot have an MRI due to his specific spinal cord stimulator- [employer] [place] 5562-1.    Nicholos Johns, MMS, PA-C [place] at [place]: [phone]  Fax: [phone]

## 2019-09-02 ENCOUNTER — Ambulatory Visit: Payer: PPO | Admitting: Radiation Oncology

## 2019-09-02 ENCOUNTER — Other Ambulatory Visit: Payer: Self-pay | Admitting: Urology

## 2019-09-03 ENCOUNTER — Other Ambulatory Visit: Payer: Self-pay

## 2019-09-03 ENCOUNTER — Encounter (HOSPITAL_COMMUNITY)
Admission: RE | Admit: 2019-09-03 | Discharge: 2019-09-03 | Disposition: A | Discharge status: Home / Self Care | Payer: PPO | Source: Ambulatory Visit | Attending: Urology | Admitting: Urology

## 2019-09-03 ENCOUNTER — Encounter (HOSPITAL_COMMUNITY): Payer: Self-pay

## 2019-09-03 DIAGNOSIS — I4510 Unspecified right bundle-branch block: Secondary | ICD-10-CM | POA: Insufficient documentation

## 2019-09-03 DIAGNOSIS — I498 Other specified cardiac arrhythmias: Secondary | ICD-10-CM | POA: Insufficient documentation

## 2019-09-03 DIAGNOSIS — Z01818 Encounter for other preprocedural examination: Secondary | ICD-10-CM | POA: Insufficient documentation

## 2019-09-03 DIAGNOSIS — I440 Atrioventricular block, first degree: Secondary | ICD-10-CM | POA: Insufficient documentation

## 2019-09-03 NOTE — Patient Instructions (Addendum)
DUE TO COVID-19 ONLY ONE VISITOR IS ALLOWED TO COME WITH YOU AND STAY IN THE WAITING ROOM ONLY DURING PRE OP AND PROCEDURE DAY OF SURGERY. THE 1 VISITOR MAY VISIT WITH YOU AFTER SURGERY IN YOUR PRIVATE ROOM DURING VISITING HOURS ONLY!  YOU NEED TO HAVE A COVID 19 TEST ON: 09/04/19 @  12:40 pm , THIS TEST MUST BE DONE BEFORE SURGERY, COME  [place], [address] , [id number].  ([place]) ONCE YOUR COVID TEST IS COMPLETED, PLEASE BEGIN THE QUARANTINE INSTRUCTIONS AS OUTLINED IN YOUR HANDOUT.                Shafter Jupin Schwegel    Your procedure is scheduled on: 09/08/19   Report to [facility] Main  Entrance   Report to admitting at: 6:50 AM     Call this number if you have problems the morning of surgery [phone]    Remember: Do not eat food or drink liquids :After Midnight.   BRUSH YOUR TEETH MORNING OF SURGERY AND RINSE YOUR MOUTH OUT, NO CHEWING GUM CANDY OR MINTS.     Take these medicines the morning of surgery with A SIP OF WATER: amlodipine,wellbutrin,famotidine,flomax,lamisil,venlafaxine.Clonazepam as needed.Eye drops as usual.  PLEASE BRING THE REMOTE FOR THE SPINAL CORD STIMULATOR.                               You may not have any metal on your body including hair pins and              piercings  Do not wear jewelry, lotions, powders or perfumes, deodorant             Men may shave face and neck.   Do not bring valuables to the hospital. [facility].  Contacts, dentures or bridgework may not be worn into surgery.  Leave suitcase in the car. After surgery it may be brought to your room.     Patients discharged the day of surgery will not be allowed to drive home. IF YOU ARE HAVING SURGERY AND GOING HOME THE SAME DAY, YOU MUST HAVE AN ADULT TO DRIVE YOU HOME AND BE WITH YOU FOR 24 HOURS. YOU MAY GO HOME BY TAXI OR UBER OR ORTHERWISE, BUT AN ADULT MUST ACCOMPANY YOU HOME AND STAY WITH YOU FOR 24  HOURS.  Name and phone number of your driver:  Special Instructions: N/A              Please read over the following fact sheets you were given: _____________________________________________________________________ [facility] - Preparing for Surgery Before surgery, you can play an important role.  Because skin is not sterile, your skin needs to be as free of germs as possible.  You can reduce the number of germs on your skin by washing with CHG (chlorahexidine gluconate) soap before surgery.  CHG is an antiseptic cleaner which kills germs and bonds with the skin to continue killing germs even after washing. Please DO NOT use if you have an allergy to CHG or antibacterial soaps.  If your skin becomes reddened/irritated stop using the CHG and inform your nurse when you arrive at Short Stay. Do not shave (including legs and underarms) for at least 48 hours prior to the first CHG shower.  You may shave your face/neck. Please follow  these instructions carefully:  1.  Shower with CHG Soap the night before surgery and the  morning of Surgery.  2.  If you choose to wash your hair, wash your hair first as usual with your  normal  shampoo.  3.  After you shampoo, rinse your hair and body thoroughly to remove the  shampoo.                           4.  Use CHG as you would any other liquid soap.  You can apply chg directly  to the skin and wash                       Gently with a scrungie or clean washcloth.  5.  Apply the CHG Soap to your body ONLY FROM THE NECK DOWN.   Do not use on face/ open                           Wound or open sores. Avoid contact with eyes, ears mouth and genitals (private parts).                       Wash face,  Genitals (private parts) with your normal soap.             6.  Wash thoroughly, paying special attention to the area where your surgery  will be performed.  7.  Thoroughly rinse your body with warm water from the neck down.  8.  DO NOT shower/wash with your normal soap  after using and rinsing off  the CHG Soap.                9.  Pat yourself dry with a clean towel.            10.  Wear clean pajamas.            11.  Place clean sheets on your bed the night of your first shower and do not  sleep with pets. Day of Surgery : Do not apply any lotions/deodorants the morning of surgery.  Please wear clean clothes to the hospital/surgery center.  FAILURE TO FOLLOW THESE INSTRUCTIONS MAY RESULT IN THE CANCELLATION OF YOUR SURGERY PATIENT SIGNATURE_________________________________  NURSE SIGNATURE__________________________________  ________________________________________________________________________

## 2019-09-03 NOTE — Progress Notes (Addendum)
COVID Vaccine Completed:yes Date COVID Vaccine completed:06/24/19 COVID vaccine manufacturer: *Pfizer    [employer]   PCP - Dr. Kathyrn Lass Cardiologist - No  Chest x-ray -  EKG -  Stress Test -  ECHO -  Cardiac Cath -   Sleep Study -  CPAP -   Fasting Blood Sugar -  Checks Blood Sugar _____ times a day  Blood Thinner Instructions:By Dr. Diona Fanti. Aspirin Instructions:To stop 5 days before Last Dose:  Anesthesia review: Hx: HTN,AAA,CHF,SPINAL CORD STIMULATOR (pt. Was instructed to bring the remote the day of surgery)  Patient denies shortness of breath, fever, cough and chest pain at PAT appointment   Patient verbalized understanding of instructions that were given to them at the PAT appointment. Patient was also instructed that they will need to review over the PAT instructions again at home before surgery.

## 2019-09-04 ENCOUNTER — Other Ambulatory Visit (HOSPITAL_COMMUNITY)
Admission: RE | Admit: 2019-09-04 | Discharge: 2019-09-04 | Disposition: A | Discharge status: Home / Self Care | Payer: PPO | Source: Ambulatory Visit | Attending: Urology | Admitting: Urology

## 2019-09-04 ENCOUNTER — Encounter (HOSPITAL_COMMUNITY)
Admission: RE | Admit: 2019-09-04 | Discharge: 2019-09-04 | Disposition: A | Discharge status: Home / Self Care | Payer: PPO | Source: Ambulatory Visit | Attending: Urology | Admitting: Urology

## 2019-09-04 ENCOUNTER — Telehealth: Payer: Self-pay | Admitting: *Deleted

## 2019-09-04 ENCOUNTER — Other Ambulatory Visit: Payer: Self-pay

## 2019-09-04 DIAGNOSIS — H2512 Age-related nuclear cataract, left eye: Secondary | ICD-10-CM | POA: Diagnosis not present

## 2019-09-04 DIAGNOSIS — Z20822 Contact with and (suspected) exposure to covid-19: Secondary | ICD-10-CM | POA: Insufficient documentation

## 2019-09-04 DIAGNOSIS — Z01812 Encounter for preprocedural laboratory examination: Secondary | ICD-10-CM | POA: Diagnosis not present

## 2019-09-04 DIAGNOSIS — H26491 Other secondary cataract, right eye: Secondary | ICD-10-CM | POA: Diagnosis not present

## 2019-09-04 DIAGNOSIS — Z01818 Encounter for other preprocedural examination: Secondary | ICD-10-CM | POA: Diagnosis not present

## 2019-09-04 DIAGNOSIS — I440 Atrioventricular block, first degree: Secondary | ICD-10-CM | POA: Diagnosis not present

## 2019-09-04 DIAGNOSIS — H25012 Cortical age-related cataract, left eye: Secondary | ICD-10-CM | POA: Diagnosis not present

## 2019-09-04 DIAGNOSIS — I4510 Unspecified right bundle-branch block: Secondary | ICD-10-CM | POA: Diagnosis not present

## 2019-09-04 DIAGNOSIS — H40013 Open angle with borderline findings, low risk, bilateral: Secondary | ICD-10-CM | POA: Diagnosis not present

## 2019-09-04 DIAGNOSIS — I498 Other specified cardiac arrhythmias: Secondary | ICD-10-CM | POA: Diagnosis not present

## 2019-09-04 LAB — BASIC METABOLIC PANEL
Anion gap: 9 (ref 5–15)
BUN: 24 mg/dL — ABNORMAL HIGH (ref 8–23)
CO2: 24 mmol/L (ref 22–32)
Calcium: 8.9 mg/dL (ref 8.9–10.3)
Chloride: 106 mmol/L (ref 98–111)
Creatinine, Ser: 1 mg/dL (ref 0.61–1.24)
GFR calc Af Amer: >60 mL/min (ref >60)
GFR calc non Af Amer: >60 mL/min (ref >60)
Glucose, Bld: 87 mg/dL (ref 70–99)
Potassium: 4.5 mmol/L (ref 3.5–5.1)
Sodium: 139 mmol/L (ref 135–145)

## 2019-09-04 LAB — CBC
HCT: 50.6 % (ref 39.0–52.0)
Hemoglobin: 16 g/dL (ref 13.0–17.0)
MCH: 28 pg (ref 26.0–34.0)
MCHC: 31.6 g/dL (ref 30.0–36.0)
MCV: 88.5 fL (ref 80.0–100.0)
Platelets: 142 10*3/uL — ABNORMAL LOW (ref 150–400)
RBC: 5.72 MIL/uL (ref 4.22–5.81)
RDW: 16.4 % — ABNORMAL HIGH (ref 11.5–15.5)
WBC: 6.9 10*3/uL (ref 4.0–10.5)
nRBC: 0 % (ref 0.0–0.2)

## 2019-09-04 LAB — SARS CORONAVIRUS 2 (TAT 6-24 HRS): SARS Coronavirus 2: NEGATIVE

## 2019-09-04 NOTE — Telephone Encounter (Signed)
CALLED PATIENT TO INFORM OF SIM BEING SCHEDULED FOR 09-23-19, LVM FOR A RETURN CALL

## 2019-09-05 ENCOUNTER — Ambulatory Visit: Payer: PPO | Admitting: Radiation Oncology

## 2019-09-06 NOTE — H&P (Signed)
H&P  Chief Complaint: PCa  History of Present Illness: 74 yo male presents for transperineal placement of fiducial markers and Space OAR  Past Medical History:  Diagnosis Date  . AAA (abdominal aortic aneurysm) ([place])   . ADD (attention deficit disorder)   . Anxiety    takes Clonazepam daily  . Arthritis    left knee  . Bipolar 1 disorder ([place])    takes Seroquel nightly  . Bipolar affective disorder ([place])    in remission on meds  . Blood transfusion    several times  . CHF (congestive heart failure) ([place])   . Chronic back pain    [facility]  . Chronic back pain    scoliosis  . Chronic low back pain 07/31/2013  . Colon polyps    Dr Cristina Gong does a colonoscopy every 5 years  . Constipation    takes Colace prn  . Depression    takes Effexor daily  . Dysrhythmia    palpitations- sees dr Irish Lack  . Enlarged prostate    slightly  . Full dentures   . GERD (gastroesophageal reflux disease)    takes Zantac daily  . Heart murmur    arrythmia  . History of shingles   . Hx of scabies Mid Feb 2013  . Hx of seasonal allergies   . Hyperlipidemia    takes Niacin daily and Lipitor daily  . Hypertension    takes Amlodipine and Quinapril daily  . Insomnia    takes Trazodone nightly  . Joint pain   . Leg pain 05-04-10   with walking  . Memory loss    short and long term  . Nocturia   . Peripheral edema    takes Furosemide prn  . Peripheral vascular disease ([place])    s/p AAA stent  . Prostate cancer ([place])   . Urinary urgency   . Wears glasses     Past Surgical History:  Procedure Laterality Date  . ABDOMINAL AORTIC ANEURYSM REPAIR  11/2009   Stent graft repair  . BACK SURGERY     thoracic spine  . CARDIAC CATHETERIZATION     15 yrs ago  . CHOLECYSTECTOMY    . COLONOSCOPY    . FOOT SURGERY     Multiple surgeries on right foot  . GANGLION CYST EXCISION Right 06/07/2012   Procedure: DISTAL POLE EXCISION RIGHT WRIST;  Surgeon: Wynonia Sours, MD;   Location: [facility];  Service: Orthopedics;  Laterality: Right;  . SHOULDER SURGERY     Right shoulder  . SPINAL CORD STIMULATOR IMPLANT  2009   NOVI [id number]  MRI UNSAFE  . SPINAL FUSION  04/04/2011   Procedure: FUSION POSTERIOR SPINAL MULTILEVEL/SCOLIOSIS;  Surgeon: Gunnar Bulla, MD;  Location: [facility];  Service: Orthopedics;  Laterality: N/A;  repair thoracic and lumbar  pseudoarthrosis, revise spinal cord stimulator, extend thoracic fusion, iliac crest bone graft  . SPINE SURGERY  12/2009   Spinal Fusion by Dr. Marcial Pacas  . TESTICLE SURGERY     right testicle  . TONSILLECTOMY    . TURP VAPORIZATION    . VASCULAR SURGERY     AAA stent repair    Home Medications:  Allergies as of 09/06/2019   No Known Allergies     Medication List    Notice   Cannot display discharge medications because the patient has not yet been admitted.     Allergies: No Known Allergies  Family History  Problem Relation Age  of Onset  . Melanoma Mother   . Diabetes Father   . Heart attack Father   . Prostate cancer Father   . Arthritis Sister        rheumatoid  . Breast cancer Sister   . Breast cancer Maternal Grandmother   . Anesthesia problems Neg Hx   . Hypotension Neg Hx   . Malignant hyperthermia Neg Hx   . Pseudochol deficiency Neg Hx   . Pancreatic cancer Neg Hx   . Colon cancer Neg Hx     Social History:  reports that he quit smoking about 5 years ago. His smoking use included cigarettes. He has a 50.00 pack-year smoking history. He has never used smokeless tobacco. He reports that he does not drink alcohol and does not use drugs.  ROS: A complete review of systems was performed.  All systems are negative except for pertinent findings as noted.  Physical Exam:  Vital signs in last 24 hours: There were no vitals taken for this visit. Constitutional:  Alert and oriented, No acute distress Cardiovascular: Regular rate  Respiratory: Normal respiratory effort GI: Abdomen  is soft, nontender, nondistended, no abdominal masses. No CVAT.  Genitourinary: Normal male phallus, testes are descended bilaterally and non-tender and without masses, scrotum is normal in appearance without lesions or masses, perineum is normal on inspection. Lymphatic: No lymphadenopathy Neurologic: Grossly intact, no focal deficits Psychiatric: Normal mood and affect  Laboratory Data:  Recent Labs    09/04/19 1213  WBC 6.9  HGB 16.0  HCT 50.6  PLT 142*    Recent Labs    09/04/19 1213  NA 139  K 4.5  CL 106  GLUCOSE 87  BUN 24*  CALCIUM 8.9  CREATININE 1.00     No results found for this or any previous visit (from the past 24 hour(s)). Recent Results (from the past 240 hour(s))  SARS CORONAVIRUS 2 (TAT 6-24 HRS) Nasopharyngeal Nasopharyngeal Swab     Status: None   Collection Time: 09/04/19 11:33 AM   Specimen: Nasopharyngeal Swab  Result Value Ref Range Status   SARS Coronavirus 2 NEGATIVE NEGATIVE Final    Comment: (NOTE) SARS-CoV-2 target nucleic acids are NOT DETECTED.  The SARS-CoV-2 RNA is generally detectable in upper and lower respiratory specimens during the acute phase of infection. Negative results do not preclude SARS-CoV-2 infection, do not rule out co-infections with other pathogens, and should not be used as the sole basis for treatment or other patient management decisions. Negative results must be combined with clinical observations, patient history, and epidemiological information. The expected result is Negative.  Fact Sheet for Patients: SugarRoll.be  Fact Sheet for Healthcare Providers: https://www.woods-mathews.com/  This test is not yet approved or cleared by the [place] FDA and  has been authorized for detection and/or diagnosis of SARS-CoV-2 by FDA under an Emergency Use Authorization (EUA). This EUA will remain  in effect (meaning this test can be used) for the duration of the COVID-19  declaration under Se ction 564(b)(1) of the Act, 21 U.S.C. section 360bbb-3(b)(1), unless the authorization is terminated or revoked sooner.  Performed at [facility], [place] [address] [place], [place] [address]     Renal Function: Recent Labs    09/04/19 1213  CREATININE 1.00   Estimated Creatinine Clearance: 83.5 mL/min (by C-G formula based on SCr of 1 mg/dL).  Radiologic Imaging: No results found.  Impression/Assessment:  PCa, soon to start EBRT  Plan:  Transperineal placement of fiducial markers and  SpaceOAR

## 2019-09-07 MED ORDER — GENTAMICIN SULFATE 40 MG/ML IJ SOLN
5.0000 mg/kg | INTRAVENOUS | Status: AC
  Administered 2019-09-08: 450 mg via INTRAVENOUS
  Filled 2019-09-07: qty 11.25

## 2019-09-07 NOTE — Anesthesia Preprocedure Evaluation (Addendum)
Anesthesia Evaluation  Patient identified by MRN, date of birth, ID band Patient awake    Reviewed: Allergy & Precautions, NPO status , Patient's Chart, lab work & pertinent test results  History of Anesthesia Complications Negative for: history of anesthetic complications  Airway Mallampati: III       Dental  (+) Edentulous Upper, Edentulous Lower   Pulmonary COPD, former smoker,    Pulmonary exam normal        Cardiovascular hypertension, Pt. on medications + Peripheral Vascular Disease (s/p EVAR) and +CHF  Normal cardiovascular exam     Neuro/Psych Anxiety Depression Bipolar Disorder Spinal cord stimulator in place    GI/Hepatic Neg liver ROS, GERD  Medicated and Controlled,  Endo/Other  Morbid obesity  Renal/GU negative Renal ROS   Prostate ca    Musculoskeletal  (+) Arthritis  (on chronic fentanyl patch 50mcg/hr),   Abdominal   Peds  Hematology negative hematology ROS (+)   Anesthesia Other Findings Day of surgery medications reviewed with patient.  Reproductive/Obstetrics negative OB ROS                            Anesthesia Physical Anesthesia Plan  ASA: III  Anesthesia Plan: General   Post-op Pain Management:    Induction: Intravenous  PONV Risk Score and Plan: 2 and Treatment may vary due to age or medical condition, Ondansetron and Dexamethasone  Airway Management Planned: LMA  Additional Equipment: None  Intra-op Plan:   Post-operative Plan: Extubation in OR  Informed Consent: I have reviewed the patients History and Physical, chart, labs and discussed the procedure including the risks, benefits and alternatives for the proposed anesthesia with the patient or authorized representative who has indicated his/her understanding and acceptance.     Dental advisory given  Plan Discussed with: CRNA  Anesthesia Plan Comments:       Anesthesia Quick  Evaluation

## 2019-09-08 ENCOUNTER — Ambulatory Visit (HOSPITAL_COMMUNITY)
Admission: RE | Admit: 2019-09-08 | Discharge: 2019-09-08 | Disposition: A | Discharge status: Home / Self Care | Payer: PPO | Attending: Urology | Admitting: Urology

## 2019-09-08 ENCOUNTER — Ambulatory Visit (HOSPITAL_COMMUNITY): Payer: PPO | Admitting: Physician Assistant

## 2019-09-08 ENCOUNTER — Encounter (HOSPITAL_COMMUNITY)
Admission: RE | Disposition: A | Discharge status: Home / Self Care | Payer: Self-pay | Source: Home / Self Care | Attending: Urology

## 2019-09-08 ENCOUNTER — Other Ambulatory Visit: Payer: Self-pay

## 2019-09-08 ENCOUNTER — Encounter (HOSPITAL_COMMUNITY): Payer: Self-pay | Admitting: Urology

## 2019-09-08 ENCOUNTER — Ambulatory Visit (HOSPITAL_COMMUNITY): Payer: PPO | Admitting: Anesthesiology

## 2019-09-08 DIAGNOSIS — G8929 Other chronic pain: Secondary | ICD-10-CM | POA: Diagnosis not present

## 2019-09-08 DIAGNOSIS — N400 Enlarged prostate without lower urinary tract symptoms: Secondary | ICD-10-CM | POA: Diagnosis not present

## 2019-09-08 DIAGNOSIS — E785 Hyperlipidemia, unspecified: Secondary | ICD-10-CM | POA: Diagnosis not present

## 2019-09-08 DIAGNOSIS — G4700 Insomnia, unspecified: Secondary | ICD-10-CM | POA: Insufficient documentation

## 2019-09-08 DIAGNOSIS — Z8261 Family history of arthritis: Secondary | ICD-10-CM | POA: Insufficient documentation

## 2019-09-08 DIAGNOSIS — I509 Heart failure, unspecified: Secondary | ICD-10-CM | POA: Insufficient documentation

## 2019-09-08 DIAGNOSIS — Z8249 Family history of ischemic heart disease and other diseases of the circulatory system: Secondary | ICD-10-CM | POA: Insufficient documentation

## 2019-09-08 DIAGNOSIS — I110 Hypertensive heart disease with heart failure: Secondary | ICD-10-CM | POA: Diagnosis not present

## 2019-09-08 DIAGNOSIS — K219 Gastro-esophageal reflux disease without esophagitis: Secondary | ICD-10-CM | POA: Diagnosis not present

## 2019-09-08 DIAGNOSIS — C61 Malignant neoplasm of prostate: Secondary | ICD-10-CM | POA: Diagnosis not present

## 2019-09-08 DIAGNOSIS — F319 Bipolar disorder, unspecified: Secondary | ICD-10-CM | POA: Insufficient documentation

## 2019-09-08 DIAGNOSIS — F419 Anxiety disorder, unspecified: Secondary | ICD-10-CM | POA: Diagnosis not present

## 2019-09-08 DIAGNOSIS — I739 Peripheral vascular disease, unspecified: Secondary | ICD-10-CM | POA: Insufficient documentation

## 2019-09-08 DIAGNOSIS — M549 Dorsalgia, unspecified: Secondary | ICD-10-CM | POA: Diagnosis not present

## 2019-09-08 DIAGNOSIS — J449 Chronic obstructive pulmonary disease, unspecified: Secondary | ICD-10-CM | POA: Insufficient documentation

## 2019-09-08 DIAGNOSIS — Z87891 Personal history of nicotine dependence: Secondary | ICD-10-CM | POA: Diagnosis not present

## 2019-09-08 DIAGNOSIS — M1712 Unilateral primary osteoarthritis, left knee: Secondary | ICD-10-CM | POA: Insufficient documentation

## 2019-09-08 HISTORY — PX: GOLD SEED IMPLANT: SHX6343

## 2019-09-08 HISTORY — PX: SPACE OAR INSTILLATION: SHX6769

## 2019-09-08 SURGERY — INSERTION, GOLD SEEDS
Anesthesia: Monitor Anesthesia Care

## 2019-09-08 MED ORDER — DEXAMETHASONE SODIUM PHOSPHATE 10 MG/ML IJ SOLN
INTRAMUSCULAR | Status: DC | PRN
  Administered 2019-09-08: 5 mg via INTRAVENOUS

## 2019-09-08 MED ORDER — FENTANYL CITRATE (PF) 100 MCG/2ML IJ SOLN
INTRAMUSCULAR | Status: AC
  Filled 2019-09-08: qty 2

## 2019-09-08 MED ORDER — LIDOCAINE 2% (20 MG/ML) 5 ML SYRINGE
INTRAMUSCULAR | Status: DC | PRN
  Administered 2019-09-08: 100 mg via INTRAVENOUS

## 2019-09-08 MED ORDER — ACETAMINOPHEN 500 MG PO TABS
1000.0000 mg | ORAL_TABLET | Freq: Once | ORAL | Status: AC
  Administered 2019-09-08: 1000 mg via ORAL
  Filled 2019-09-08: qty 2

## 2019-09-08 MED ORDER — FENTANYL CITRATE (PF) 100 MCG/2ML IJ SOLN: 25.0000 ug | INTRAMUSCULAR | Status: DC | PRN

## 2019-09-08 MED ORDER — PROPOFOL 10 MG/ML IV BOLUS
INTRAVENOUS | Status: AC
  Filled 2019-09-08: qty 20

## 2019-09-08 MED ORDER — SODIUM CHLORIDE (PF) 0.9 % IJ SOLN
INTRAMUSCULAR | Status: AC
  Filled 2019-09-08: qty 10

## 2019-09-08 MED ORDER — ORAL CARE MOUTH RINSE: 15.0000 mL | Freq: Once | OROMUCOSAL | Status: AC

## 2019-09-08 MED ORDER — OXYCODONE HCL 5 MG PO TABS: 5.0000 mg | ORAL_TABLET | Freq: Once | ORAL | Status: DC | PRN

## 2019-09-08 MED ORDER — PROMETHAZINE HCL 25 MG/ML IJ SOLN: 6.2500 mg | INTRAMUSCULAR | Status: DC | PRN

## 2019-09-08 MED ORDER — LACTATED RINGERS IV SOLN: INTRAVENOUS | Status: DC

## 2019-09-08 MED ORDER — OXYCODONE HCL 5 MG/5ML PO SOLN: 5.0000 mg | Freq: Once | ORAL | Status: DC | PRN

## 2019-09-08 MED ORDER — CHLORHEXIDINE GLUCONATE 0.12 % MT SOLN
15.0000 mL | Freq: Once | OROMUCOSAL | Status: AC
  Administered 2019-09-08: 15 mL via OROMUCOSAL

## 2019-09-08 MED ORDER — PROPOFOL 10 MG/ML IV BOLUS
INTRAVENOUS | Status: DC | PRN
  Administered 2019-09-08: 170 mg via INTRAVENOUS

## 2019-09-08 MED ORDER — EPHEDRINE SULFATE-NACL 50-0.9 MG/10ML-% IV SOSY
PREFILLED_SYRINGE | INTRAVENOUS | Status: DC | PRN
  Administered 2019-09-08: 5 mg via INTRAVENOUS

## 2019-09-08 MED ORDER — ONDANSETRON HCL 4 MG/2ML IJ SOLN
INTRAMUSCULAR | Status: DC | PRN
  Administered 2019-09-08: 4 mg via INTRAVENOUS

## 2019-09-08 MED ORDER — FENTANYL CITRATE (PF) 100 MCG/2ML IJ SOLN
INTRAMUSCULAR | Status: DC | PRN
  Administered 2019-09-08: 100 ug via INTRAVENOUS

## 2019-09-08 SURGICAL SUPPLY — 19 items
DRAPE SHEET LG 3/4 BI-LAMINATE (DRAPES) ×2 IMPLANT
DRSG TEGADERM 4X4.75 (GAUZE/BANDAGES/DRESSINGS) ×2 IMPLANT
DRSG TEGADERM 8X12 (GAUZE/BANDAGES/DRESSINGS) ×2 IMPLANT
GLOVE BIO SURGEON STRL SZ7.5 (GLOVE) ×2 IMPLANT
GLOVE BIO SURGEON STRL SZ8 (GLOVE) ×2 IMPLANT
GLOVE ECLIPSE 8.0 STRL XLNG CF (GLOVE) ×2 IMPLANT
GOWN STRL REUS W/TWL LRG LVL3 (GOWN DISPOSABLE) ×2 IMPLANT
GOWN STRL REUS W/TWL XL LVL3 (GOWN DISPOSABLE) ×2 IMPLANT
IMPL SPACEOAR VUE SYSTEM (Spacer) ×1 IMPLANT
IMPLANT SPACEOAR VUE SYSTEM (Spacer) ×2 IMPLANT
KIT TURNOVER KIT A (KITS) ×2 IMPLANT
MARKER GOLD PRELOAD 1.2X3 (Urological Implant) ×1 IMPLANT
PACK CYSTO (CUSTOM PROCEDURE TRAY) IMPLANT
PAD PREP 24X48 CUFFED NSTRL (MISCELLANEOUS) ×2 IMPLANT
PAD TELFA 2X3 NADH STRL (GAUZE/BANDAGES/DRESSINGS) ×4 IMPLANT
PENCIL SMOKE EVACUATOR (MISCELLANEOUS) IMPLANT
SEED GOLD PRELOAD 1.2X3 (Urological Implant) ×2 IMPLANT
SURGILUBE 2OZ TUBE FLIPTOP (MISCELLANEOUS) ×2 IMPLANT
UNDERPAD 30X36 HEAVY ABSORB (UNDERPADS AND DIAPERS) ×2 IMPLANT

## 2019-09-08 NOTE — Op Note (Signed)
Preoperative diagnosis: Prostate cancer  Postoperative diagnosis:  Same   Principle procedure:  Placement of fiducial markers, SpaceOAR  Surgeon:  Iosefa Weintraub    Anesthesia:  General with LMA   Complications: None  Indications:  Prostate cancer   Description of procedure: The patient was properly identified in the holding area.    He was taken to the operating room where general anesthetic was administered with a LMA. He was placed in the lithotomy position.  Genitalia were retracted anteriorly   With tape.  Time-out was performed.  Perineum was prepped.  Transrectal ultrasound probe was placed.  The prostate was imaged both in transverse and sagittal dimensions.  3 fiducial markers were placed with ultrasound guidance, 1 in the right base of the prostate, 1 in the right apex of the prostate, 1 in the left lateral prostate in the mid gland.  Following this, spinal needle was placed in the area between Denonvillier's fascia  and the rectum.  Once localized , 5 mL of saline was utilized to identify proper positioning.   Once the needle was properly position, the SpaceOAR  Was injected.  Adequate dissection was identified both in transverse and sagittal dimensions.  The needle was then removed and the procedure terminated.  The patient was awakened and taken to the PACU in stable condition.

## 2019-09-08 NOTE — Discharge Instructions (Signed)
Report any issues with urination  You can return to normal activities starting on Tuesday.    Follow-up with Radiation Oncology as scheduled

## 2019-09-08 NOTE — Transfer of Care (Signed)
Immediate Anesthesia Transfer of Care Note  Patient: Michael Lutz  Procedure(s) Performed: GOLD SEED IMPLANT (N/A ) SPACE OAR INSTILLATION (N/A )  Patient Location: PACU  Anesthesia Type:General  Level of Consciousness: sedated, patient cooperative and responds to stimulation  Airway & Oxygen Therapy: Patient Spontanous Breathing and Patient connected to face mask oxygen  Post-op Assessment: Report given to RN and Post -op Vital signs reviewed and stable  Post vital signs: Reviewed and stable  Last Vitals:  Vitals Value Taken Time  BP 128/77 09/08/19 0938  Temp 36.4 C 09/08/19 0938  Pulse 66 09/08/19 0940  Resp 15 09/08/19 0940  SpO2 97 % 09/08/19 0940  Vitals shown include unvalidated device data.  Last Pain:  Vitals:   09/08/19 0703  TempSrc: Oral         Complications: No complications documented.

## 2019-09-08 NOTE — Anesthesia Postprocedure Evaluation (Signed)
Anesthesia Post Note  Patient: Michael Lutz  Procedure(s) Performed: GOLD SEED IMPLANT (N/A ) SPACE OAR INSTILLATION (N/A )     Patient location during evaluation: PACU Anesthesia Type: General Level of consciousness: awake and alert and oriented Pain management: pain level controlled Vital Signs Assessment: post-procedure vital signs reviewed and stable Respiratory status: spontaneous breathing, nonlabored ventilation and respiratory function stable Cardiovascular status: blood pressure returned to baseline Postop Assessment: no apparent nausea or vomiting Anesthetic complications: no   No complications documented.  Last Vitals:  Vitals:   09/08/19 1000 09/08/19 1015  BP: 102/64 111/75  Pulse: 69 66  Resp: 14 10  Temp:  36.9 C  SpO2: (!) 87% 93%    Last Pain:  Vitals:   09/08/19 1015  TempSrc:   PainSc: 0-No pain                 Brennan Bailey

## 2019-09-08 NOTE — Anesthesia Procedure Notes (Signed)
Procedure Name: LMA Insertion Date/Time: 09/08/2019 9:05 AM Performed by: Gean Maidens, CRNA Pre-anesthesia Checklist: Patient identified Patient Re-evaluated:Patient Re-evaluated prior to induction Oxygen Delivery Method: Circle system utilized Preoxygenation: Pre-oxygenation with 100% oxygen Induction Type: IV induction LMA: LMA inserted LMA Size: 5.0 Number of attempts: 1 Placement Confirmation: positive ETCO2 and breath sounds checked- equal and bilateral Tube secured with: Tape Dental Injury: Teeth and Oropharynx as per pre-operative assessment  Comments: Placed by Frutoso Chase

## 2019-09-08 NOTE — Interval H&P Note (Signed)
History and Physical Interval Note:  09/08/2019 8:19 AM  Michael Lutz  has presented today for surgery, with the diagnosis of PROSTATE CANCER.  The various methods of treatment have been discussed with the patient and family. After consideration of risks, benefits and other options for treatment, the patient has consented to  Procedure(s) with comments: GOLD SEED IMPLANT (N/A) - 30 MINS SPACE OAR INSTILLATION (N/A) as a surgical intervention.  The patient's history has been reviewed, patient examined, no change in status, stable for surgery.  I have reviewed the patient's chart and labs.  Questions were answered to the patient's satisfaction.     Michael Lutz Michael Lutz

## 2019-09-09 ENCOUNTER — Encounter (HOSPITAL_COMMUNITY): Payer: Self-pay | Admitting: Urology

## 2019-09-09 DIAGNOSIS — M961 Postlaminectomy syndrome, not elsewhere classified: Secondary | ICD-10-CM | POA: Diagnosis not present

## 2019-09-09 DIAGNOSIS — Z79891 Long term (current) use of opiate analgesic: Secondary | ICD-10-CM | POA: Diagnosis not present

## 2019-09-09 DIAGNOSIS — M62838 Other muscle spasm: Secondary | ICD-10-CM | POA: Diagnosis not present

## 2019-09-09 DIAGNOSIS — G894 Chronic pain syndrome: Secondary | ICD-10-CM | POA: Diagnosis not present

## 2019-09-22 ENCOUNTER — Telehealth: Payer: Self-pay | Admitting: *Deleted

## 2019-09-22 NOTE — Telephone Encounter (Signed)
CALLED PATIENT TO REMIND OF SIM APPT. FOR 09-23-19 - ARRIVAL TIME- 2:45 PM @ [facility], SPOKE WITH PATIENT AND HE IS AWARE OF THIS APPT.

## 2019-09-23 ENCOUNTER — Ambulatory Visit
Admission: RE | Admit: 2019-09-23 | Discharge: 2019-09-23 | Disposition: A | Discharge status: Home / Self Care | Payer: PPO | Source: Ambulatory Visit | Attending: Radiation Oncology | Admitting: Radiation Oncology

## 2019-09-23 ENCOUNTER — Other Ambulatory Visit: Payer: Self-pay

## 2019-09-23 DIAGNOSIS — C61 Malignant neoplasm of prostate: Secondary | ICD-10-CM

## 2019-09-23 NOTE — Progress Notes (Signed)
  Radiation Oncology         [phone] ________________________________  Name: Michael Lutz MRN: [id number]  Date: 09/23/2019  DOB: [birth year]  SIMULATION AND TREATMENT PLANNING NOTE    ICD-10-CM   1. Malignant neoplasm of prostate ([place])  C61     DIAGNOSIS:  74 y.o. gentleman with Stage T1c adenocarcinoma of the prostate with Gleason score of 4+3, and PSA of 7.76  NARRATIVE:  The patient was brought to the [facility].  Identity was confirmed.  All relevant records and images related to the planned course of therapy were reviewed.  The patient freely provided informed written consent to proceed with treatment after reviewing the details related to the planned course of therapy. The consent form was witnessed and verified by the simulation staff.  Then, the patient was set-up in a stable reproducible supine position for radiation therapy.  A vacuum lock pillow device was custom fabricated to position his legs in a reproducible immobilized position.  Then, I performed a urethrogram under sterile conditions to identify the prostatic apex.  CT images were obtained.  Surface markings were placed.  The CT images were loaded into the planning software.  Then the prostate target and avoidance structures including the rectum, bladder, bowel and hips were contoured.  Treatment planning then occurred.  The radiation prescription was entered and confirmed.  A total of one complex treatment devices was fabricated. I have requested : Intensity Modulated Radiotherapy (IMRT) is medically necessary for this case for the following reason:  Rectal sparing.Marland Kitchen  PLAN:  The patient will receive 70 Gy in 28 fractions.  ________________________________  Sheral Apley Tammi Klippel, M.D.   This document serves as a record of services personally performed by Tyler Pita, MD. It was created on his behalf by Jacqualyn Posey, a trained medical scribe. The creation of this record is based on the scribe's personal  observations and the provider's statements to them. This document has been checked and approved by the attending provider.

## 2019-09-29 DIAGNOSIS — E7800 Pure hypercholesterolemia, unspecified: Secondary | ICD-10-CM | POA: Diagnosis not present

## 2019-09-29 DIAGNOSIS — J439 Emphysema, unspecified: Secondary | ICD-10-CM | POA: Diagnosis not present

## 2019-09-29 DIAGNOSIS — I2510 Atherosclerotic heart disease of native coronary artery without angina pectoris: Secondary | ICD-10-CM | POA: Diagnosis not present

## 2019-09-29 DIAGNOSIS — N401 Enlarged prostate with lower urinary tract symptoms: Secondary | ICD-10-CM | POA: Diagnosis not present

## 2019-09-29 DIAGNOSIS — C61 Malignant neoplasm of prostate: Secondary | ICD-10-CM | POA: Diagnosis not present

## 2019-09-29 DIAGNOSIS — I10 Essential (primary) hypertension: Secondary | ICD-10-CM | POA: Diagnosis not present

## 2019-09-29 DIAGNOSIS — F319 Bipolar disorder, unspecified: Secondary | ICD-10-CM | POA: Diagnosis not present

## 2019-09-29 DIAGNOSIS — M8580 Other specified disorders of bone density and structure, unspecified site: Secondary | ICD-10-CM | POA: Diagnosis not present

## 2019-09-30 DIAGNOSIS — C61 Malignant neoplasm of prostate: Secondary | ICD-10-CM | POA: Diagnosis not present

## 2019-10-01 DIAGNOSIS — H2512 Age-related nuclear cataract, left eye: Secondary | ICD-10-CM | POA: Diagnosis not present

## 2019-10-01 DIAGNOSIS — H25012 Cortical age-related cataract, left eye: Secondary | ICD-10-CM | POA: Diagnosis not present

## 2019-10-02 DIAGNOSIS — C61 Malignant neoplasm of prostate: Secondary | ICD-10-CM | POA: Diagnosis not present

## 2019-10-06 ENCOUNTER — Ambulatory Visit
Admission: RE | Admit: 2019-10-06 | Discharge: 2019-10-06 | Disposition: A | Discharge status: Home / Self Care | Payer: PPO | Source: Ambulatory Visit | Attending: Radiation Oncology | Admitting: Radiation Oncology

## 2019-10-06 ENCOUNTER — Other Ambulatory Visit: Payer: Self-pay

## 2019-10-06 DIAGNOSIS — C61 Malignant neoplasm of prostate: Secondary | ICD-10-CM | POA: Diagnosis not present

## 2019-10-07 ENCOUNTER — Ambulatory Visit
Admission: RE | Admit: 2019-10-07 | Discharge: 2019-10-07 | Disposition: A | Discharge status: Home / Self Care | Payer: PPO | Source: Ambulatory Visit | Attending: Radiation Oncology | Admitting: Radiation Oncology

## 2019-10-07 ENCOUNTER — Other Ambulatory Visit: Payer: Self-pay

## 2019-10-07 DIAGNOSIS — C61 Malignant neoplasm of prostate: Secondary | ICD-10-CM | POA: Diagnosis not present

## 2019-10-08 ENCOUNTER — Ambulatory Visit
Admission: RE | Admit: 2019-10-08 | Discharge: 2019-10-08 | Disposition: A | Discharge status: Home / Self Care | Payer: PPO | Source: Ambulatory Visit | Attending: Radiation Oncology | Admitting: Radiation Oncology

## 2019-10-08 ENCOUNTER — Other Ambulatory Visit: Payer: Self-pay

## 2019-10-08 DIAGNOSIS — C61 Malignant neoplasm of prostate: Secondary | ICD-10-CM | POA: Diagnosis not present

## 2019-10-09 ENCOUNTER — Other Ambulatory Visit: Payer: Self-pay

## 2019-10-09 ENCOUNTER — Ambulatory Visit
Admission: RE | Admit: 2019-10-09 | Discharge: 2019-10-09 | Disposition: A | Discharge status: Home / Self Care | Payer: PPO | Source: Ambulatory Visit | Attending: Radiation Oncology | Admitting: Radiation Oncology

## 2019-10-09 DIAGNOSIS — C61 Malignant neoplasm of prostate: Secondary | ICD-10-CM | POA: Diagnosis not present

## 2019-10-10 ENCOUNTER — Other Ambulatory Visit: Payer: Self-pay

## 2019-10-10 ENCOUNTER — Ambulatory Visit
Admission: RE | Admit: 2019-10-10 | Discharge: 2019-10-10 | Disposition: A | Discharge status: Home / Self Care | Payer: PPO | Source: Ambulatory Visit | Attending: Radiation Oncology | Admitting: Radiation Oncology

## 2019-10-10 DIAGNOSIS — C61 Malignant neoplasm of prostate: Secondary | ICD-10-CM | POA: Diagnosis not present

## 2019-10-13 ENCOUNTER — Other Ambulatory Visit: Payer: Self-pay

## 2019-10-13 ENCOUNTER — Ambulatory Visit
Admission: RE | Admit: 2019-10-13 | Discharge: 2019-10-13 | Disposition: A | Discharge status: Home / Self Care | Payer: PPO | Source: Ambulatory Visit | Attending: Radiation Oncology | Admitting: Radiation Oncology

## 2019-10-13 DIAGNOSIS — C61 Malignant neoplasm of prostate: Secondary | ICD-10-CM | POA: Diagnosis not present

## 2019-10-13 DIAGNOSIS — R300 Dysuria: Secondary | ICD-10-CM | POA: Diagnosis not present

## 2019-10-14 ENCOUNTER — Other Ambulatory Visit: Payer: Self-pay

## 2019-10-14 ENCOUNTER — Ambulatory Visit
Admission: RE | Admit: 2019-10-14 | Discharge: 2019-10-14 | Disposition: A | Discharge status: Home / Self Care | Payer: PPO | Source: Ambulatory Visit | Attending: Radiation Oncology | Admitting: Radiation Oncology

## 2019-10-14 ENCOUNTER — Telehealth: Payer: Self-pay | Admitting: Radiation Oncology

## 2019-10-14 DIAGNOSIS — C61 Malignant neoplasm of prostate: Secondary | ICD-10-CM | POA: Diagnosis not present

## 2019-10-14 NOTE — Telephone Encounter (Signed)
Emailed signed [place] to [email or web address]

## 2019-10-15 ENCOUNTER — Ambulatory Visit
Admission: RE | Admit: 2019-10-15 | Discharge: 2019-10-15 | Disposition: A | Discharge status: Home / Self Care | Payer: PPO | Source: Ambulatory Visit | Attending: Radiation Oncology | Admitting: Radiation Oncology

## 2019-10-15 ENCOUNTER — Other Ambulatory Visit: Payer: Self-pay

## 2019-10-15 DIAGNOSIS — C61 Malignant neoplasm of prostate: Secondary | ICD-10-CM | POA: Diagnosis not present

## 2019-10-16 ENCOUNTER — Other Ambulatory Visit: Payer: Self-pay

## 2019-10-16 ENCOUNTER — Ambulatory Visit
Admission: RE | Admit: 2019-10-16 | Discharge: 2019-10-16 | Disposition: A | Discharge status: Home / Self Care | Payer: PPO | Source: Ambulatory Visit | Attending: Radiation Oncology | Admitting: Radiation Oncology

## 2019-10-16 DIAGNOSIS — C61 Malignant neoplasm of prostate: Secondary | ICD-10-CM | POA: Diagnosis not present

## 2019-10-17 ENCOUNTER — Ambulatory Visit
Admission: RE | Admit: 2019-10-17 | Discharge: 2019-10-17 | Disposition: A | Discharge status: Home / Self Care | Payer: PPO | Source: Ambulatory Visit | Attending: Radiation Oncology | Admitting: Radiation Oncology

## 2019-10-17 DIAGNOSIS — C61 Malignant neoplasm of prostate: Secondary | ICD-10-CM | POA: Diagnosis not present

## 2019-10-20 ENCOUNTER — Other Ambulatory Visit: Payer: Self-pay

## 2019-10-20 ENCOUNTER — Ambulatory Visit
Admission: RE | Admit: 2019-10-20 | Discharge: 2019-10-20 | Disposition: A | Discharge status: Home / Self Care | Payer: PPO | Source: Ambulatory Visit | Attending: Radiation Oncology | Admitting: Radiation Oncology

## 2019-10-20 DIAGNOSIS — C61 Malignant neoplasm of prostate: Secondary | ICD-10-CM | POA: Diagnosis not present

## 2019-10-21 ENCOUNTER — Ambulatory Visit
Admission: RE | Admit: 2019-10-21 | Discharge: 2019-10-21 | Disposition: A | Discharge status: Home / Self Care | Payer: PPO | Source: Ambulatory Visit | Attending: Radiation Oncology | Admitting: Radiation Oncology

## 2019-10-21 ENCOUNTER — Other Ambulatory Visit: Payer: Self-pay

## 2019-10-21 DIAGNOSIS — C61 Malignant neoplasm of prostate: Secondary | ICD-10-CM | POA: Diagnosis not present

## 2019-10-22 ENCOUNTER — Other Ambulatory Visit: Payer: Self-pay

## 2019-10-22 ENCOUNTER — Ambulatory Visit
Admission: RE | Admit: 2019-10-22 | Discharge: 2019-10-22 | Disposition: A | Discharge status: Home / Self Care | Payer: PPO | Source: Ambulatory Visit | Attending: Radiation Oncology | Admitting: Radiation Oncology

## 2019-10-22 DIAGNOSIS — C61 Malignant neoplasm of prostate: Secondary | ICD-10-CM | POA: Diagnosis not present

## 2019-10-23 ENCOUNTER — Ambulatory Visit
Admission: RE | Admit: 2019-10-23 | Discharge: 2019-10-23 | Disposition: A | Discharge status: Home / Self Care | Payer: PPO | Source: Ambulatory Visit | Attending: Radiation Oncology | Admitting: Radiation Oncology

## 2019-10-23 ENCOUNTER — Other Ambulatory Visit: Payer: Self-pay

## 2019-10-23 DIAGNOSIS — C61 Malignant neoplasm of prostate: Secondary | ICD-10-CM | POA: Diagnosis not present

## 2019-10-24 ENCOUNTER — Other Ambulatory Visit: Payer: Self-pay

## 2019-10-24 ENCOUNTER — Ambulatory Visit
Admission: RE | Admit: 2019-10-24 | Discharge: 2019-10-24 | Disposition: A | Discharge status: Home / Self Care | Payer: PPO | Source: Ambulatory Visit | Attending: Radiation Oncology | Admitting: Radiation Oncology

## 2019-10-24 ENCOUNTER — Other Ambulatory Visit: Payer: Self-pay | Admitting: Radiation Oncology

## 2019-10-24 DIAGNOSIS — C61 Malignant neoplasm of prostate: Secondary | ICD-10-CM | POA: Diagnosis not present

## 2019-10-24 DIAGNOSIS — R300 Dysuria: Secondary | ICD-10-CM

## 2019-10-24 MED ORDER — CIPROFLOXACIN HCL 500 MG PO TABS: 500.0000 mg | ORAL_TABLET | Freq: Two times a day (BID) | ORAL | 0 refills | Status: DC

## 2019-10-27 ENCOUNTER — Ambulatory Visit
Admission: RE | Admit: 2019-10-27 | Discharge: 2019-10-27 | Disposition: A | Discharge status: Home / Self Care | Payer: PPO | Source: Ambulatory Visit | Attending: Radiation Oncology | Admitting: Radiation Oncology

## 2019-10-27 ENCOUNTER — Other Ambulatory Visit: Payer: Self-pay

## 2019-10-27 DIAGNOSIS — C61 Malignant neoplasm of prostate: Secondary | ICD-10-CM | POA: Diagnosis not present

## 2019-10-27 DIAGNOSIS — R300 Dysuria: Secondary | ICD-10-CM

## 2019-10-27 LAB — URINALYSIS, COMPLETE (UACMP) WITH MICROSCOPIC
Bacteria, UA: NONE SEEN
Bilirubin Urine: NEGATIVE
Glucose, UA: NEGATIVE mg/dL
Hgb urine dipstick: NEGATIVE
Ketones, ur: NEGATIVE mg/dL
Nitrite: NEGATIVE
Protein, ur: 30 mg/dL — AB
Specific Gravity, Urine: 1.02 (ref 1.005–1.030)
pH: 5 (ref 5.0–8.0)

## 2019-10-28 ENCOUNTER — Telehealth: Payer: Self-pay

## 2019-10-28 ENCOUNTER — Other Ambulatory Visit: Payer: Self-pay

## 2019-10-28 ENCOUNTER — Ambulatory Visit
Admission: RE | Admit: 2019-10-28 | Discharge: 2019-10-28 | Disposition: A | Discharge status: Home / Self Care | Payer: PPO | Source: Ambulatory Visit | Attending: Radiation Oncology | Admitting: Radiation Oncology

## 2019-10-28 ENCOUNTER — Other Ambulatory Visit: Payer: Self-pay | Admitting: Urology

## 2019-10-28 DIAGNOSIS — C61 Malignant neoplasm of prostate: Secondary | ICD-10-CM | POA: Diagnosis not present

## 2019-10-28 LAB — URINE CULTURE: Culture: NO GROWTH

## 2019-10-28 MED ORDER — SULFAMETHOXAZOLE-TRIMETHOPRIM 800-160 MG PO TABS: 1.0000 | ORAL_TABLET | Freq: Two times a day (BID) | ORAL | 0 refills | Status: AC

## 2019-10-28 NOTE — Telephone Encounter (Signed)
Called patient and left VM to let him know that given results from his UA/culture, Dr. Tammi Klippel would like him to start on an empiric antibiotic. Informed him that Napier Field sent a prescription for Bactrim to [facility] [address] and that he should start it as soon as possible and take it until completed. Advised him that he could also pick up OTC-Azo to help with dysuria symptoms, but the most important thing was to finish full course of antibiotics. Provided direct call back number should he have any other questions/concerns.

## 2019-10-28 NOTE — Progress Notes (Signed)
Call Pharmacy this morning to see if Septra DS would be ok for the patient to take since he has a Fentanyl patch.Pharmacist states that it would be ok. Notified Ashlyn . Also notifed the patient that he could pick the prescription up from the pharmacy later today.

## 2019-10-29 ENCOUNTER — Ambulatory Visit
Admission: RE | Admit: 2019-10-29 | Discharge: 2019-10-29 | Disposition: A | Discharge status: Home / Self Care | Payer: PPO | Source: Ambulatory Visit | Attending: Radiation Oncology | Admitting: Radiation Oncology

## 2019-10-29 ENCOUNTER — Other Ambulatory Visit: Payer: Self-pay

## 2019-10-29 DIAGNOSIS — C61 Malignant neoplasm of prostate: Secondary | ICD-10-CM | POA: Diagnosis not present

## 2019-10-30 ENCOUNTER — Ambulatory Visit
Admission: RE | Admit: 2019-10-30 | Discharge: 2019-10-30 | Disposition: A | Discharge status: Home / Self Care | Payer: PPO | Source: Ambulatory Visit | Attending: Radiation Oncology | Admitting: Radiation Oncology

## 2019-10-30 ENCOUNTER — Other Ambulatory Visit: Payer: Self-pay

## 2019-10-30 DIAGNOSIS — C61 Malignant neoplasm of prostate: Secondary | ICD-10-CM | POA: Diagnosis not present

## 2019-10-31 ENCOUNTER — Ambulatory Visit
Admission: RE | Admit: 2019-10-31 | Discharge: 2019-10-31 | Disposition: A | Discharge status: Home / Self Care | Payer: PPO | Source: Ambulatory Visit | Attending: Radiation Oncology | Admitting: Radiation Oncology

## 2019-10-31 ENCOUNTER — Other Ambulatory Visit: Payer: Self-pay

## 2019-10-31 DIAGNOSIS — C61 Malignant neoplasm of prostate: Secondary | ICD-10-CM | POA: Diagnosis not present

## 2019-10-31 DIAGNOSIS — H26491 Other secondary cataract, right eye: Secondary | ICD-10-CM | POA: Diagnosis not present

## 2019-11-03 ENCOUNTER — Other Ambulatory Visit: Payer: Self-pay

## 2019-11-03 ENCOUNTER — Ambulatory Visit
Admission: RE | Admit: 2019-11-03 | Discharge: 2019-11-03 | Disposition: A | Discharge status: Home / Self Care | Payer: PPO | Source: Ambulatory Visit | Attending: Radiation Oncology | Admitting: Radiation Oncology

## 2019-11-03 ENCOUNTER — Telehealth: Payer: Self-pay | Admitting: Radiation Oncology

## 2019-11-03 DIAGNOSIS — Z79891 Long term (current) use of opiate analgesic: Secondary | ICD-10-CM | POA: Diagnosis not present

## 2019-11-03 DIAGNOSIS — C61 Malignant neoplasm of prostate: Secondary | ICD-10-CM | POA: Diagnosis not present

## 2019-11-03 DIAGNOSIS — G894 Chronic pain syndrome: Secondary | ICD-10-CM | POA: Diagnosis not present

## 2019-11-03 DIAGNOSIS — M961 Postlaminectomy syndrome, not elsewhere classified: Secondary | ICD-10-CM | POA: Diagnosis not present

## 2019-11-03 DIAGNOSIS — M62838 Other muscle spasm: Secondary | ICD-10-CM | POA: Diagnosis not present

## 2019-11-03 NOTE — Telephone Encounter (Signed)
Received voicemail message from patient requesting return call. Phoned patient back to inquire. Patient reports dysuria with two tablets of antibiotics left. Patient denies taking AZO. Patient states, "I forgot to pick that (AZO) up." Encouraged patient to pick up over the counter AZO and begin taking three times per day. Explained if relief wasn't felt with AZO after 48 hours to contact his urologist for an appointment. Patient verbalized understanding of all reviewed and appreciation for the return call.

## 2019-11-04 ENCOUNTER — Other Ambulatory Visit: Payer: Self-pay

## 2019-11-04 ENCOUNTER — Ambulatory Visit
Admission: RE | Admit: 2019-11-04 | Discharge: 2019-11-04 | Disposition: A | Discharge status: Home / Self Care | Payer: PPO | Source: Ambulatory Visit | Attending: Radiation Oncology | Admitting: Radiation Oncology

## 2019-11-04 ENCOUNTER — Telehealth: Payer: Self-pay | Admitting: Radiation Oncology

## 2019-11-04 DIAGNOSIS — C61 Malignant neoplasm of prostate: Secondary | ICD-10-CM | POA: Diagnosis not present

## 2019-11-04 NOTE — Telephone Encounter (Signed)
Received voicemail message from patient requesting return call. Phoned patient to inquire. Patient reports taking third dose of AZO with breakfast. Patient states, "the box says I shouldn't take this more than 2 days." Reinforced that the warning is for those that aren't under the care of a provider. Reinforced we have ruled out a UTI and are confident the radiation is the source of his discomfort with urination making it safe to take the AZO for more than 2 days. Patient questions how long he can take the [place]. Explained AZO may be needed for up to three weeks s/p completion of radiation therapy. Patient questions if he should reduce or increase his water intake. Reinforced the more water he drinks the more dilute his urine will be making the discomfort of voiding less. Patient verbalized understanding of all reviewed.

## 2019-11-05 ENCOUNTER — Other Ambulatory Visit: Payer: Self-pay

## 2019-11-05 ENCOUNTER — Ambulatory Visit
Admission: RE | Admit: 2019-11-05 | Discharge: 2019-11-05 | Disposition: A | Discharge status: Home / Self Care | Payer: PPO | Source: Ambulatory Visit | Attending: Radiation Oncology | Admitting: Radiation Oncology

## 2019-11-05 DIAGNOSIS — C61 Malignant neoplasm of prostate: Secondary | ICD-10-CM | POA: Diagnosis not present

## 2019-11-06 ENCOUNTER — Ambulatory Visit
Admission: RE | Admit: 2019-11-06 | Discharge: 2019-11-06 | Disposition: A | Discharge status: Home / Self Care | Payer: PPO | Source: Ambulatory Visit | Attending: Radiation Oncology | Admitting: Radiation Oncology

## 2019-11-06 ENCOUNTER — Other Ambulatory Visit: Payer: Self-pay

## 2019-11-06 DIAGNOSIS — C61 Malignant neoplasm of prostate: Secondary | ICD-10-CM | POA: Diagnosis not present

## 2019-11-07 ENCOUNTER — Other Ambulatory Visit: Payer: Self-pay

## 2019-11-07 ENCOUNTER — Other Ambulatory Visit: Payer: Self-pay | Admitting: Radiation Oncology

## 2019-11-07 ENCOUNTER — Ambulatory Visit
Admission: RE | Admit: 2019-11-07 | Discharge: 2019-11-07 | Disposition: A | Discharge status: Home / Self Care | Payer: PPO | Source: Ambulatory Visit | Attending: Radiation Oncology | Admitting: Radiation Oncology

## 2019-11-07 DIAGNOSIS — C61 Malignant neoplasm of prostate: Secondary | ICD-10-CM | POA: Diagnosis not present

## 2019-11-07 MED ORDER — SULFAMETHOXAZOLE-TRIMETHOPRIM 800-160 MG PO TABS
1.0000 | ORAL_TABLET | Freq: Two times a day (BID) | ORAL | 0 refills | Status: DC
Start: 2019-11-07 — End: 2020-09-08

## 2019-11-10 ENCOUNTER — Other Ambulatory Visit: Payer: Self-pay

## 2019-11-10 ENCOUNTER — Ambulatory Visit
Admission: RE | Admit: 2019-11-10 | Discharge: 2019-11-10 | Disposition: A | Discharge status: Home / Self Care | Payer: PPO | Source: Ambulatory Visit | Attending: Radiation Oncology | Admitting: Radiation Oncology

## 2019-11-10 DIAGNOSIS — C61 Malignant neoplasm of prostate: Secondary | ICD-10-CM | POA: Diagnosis not present

## 2019-11-11 ENCOUNTER — Other Ambulatory Visit: Payer: Self-pay

## 2019-11-11 ENCOUNTER — Ambulatory Visit
Admission: RE | Admit: 2019-11-11 | Discharge: 2019-11-11 | Disposition: A | Discharge status: Home / Self Care | Payer: PPO | Source: Ambulatory Visit | Attending: Radiation Oncology | Admitting: Radiation Oncology

## 2019-11-11 DIAGNOSIS — C61 Malignant neoplasm of prostate: Secondary | ICD-10-CM | POA: Diagnosis not present

## 2019-11-11 DIAGNOSIS — I10 Essential (primary) hypertension: Secondary | ICD-10-CM | POA: Diagnosis not present

## 2019-11-11 DIAGNOSIS — I2510 Atherosclerotic heart disease of native coronary artery without angina pectoris: Secondary | ICD-10-CM | POA: Diagnosis not present

## 2019-11-11 DIAGNOSIS — M8580 Other specified disorders of bone density and structure, unspecified site: Secondary | ICD-10-CM | POA: Diagnosis not present

## 2019-11-11 DIAGNOSIS — J439 Emphysema, unspecified: Secondary | ICD-10-CM | POA: Diagnosis not present

## 2019-11-11 DIAGNOSIS — E7800 Pure hypercholesterolemia, unspecified: Secondary | ICD-10-CM | POA: Diagnosis not present

## 2019-11-11 DIAGNOSIS — N401 Enlarged prostate with lower urinary tract symptoms: Secondary | ICD-10-CM | POA: Diagnosis not present

## 2019-11-11 DIAGNOSIS — F319 Bipolar disorder, unspecified: Secondary | ICD-10-CM | POA: Diagnosis not present

## 2019-11-12 ENCOUNTER — Other Ambulatory Visit: Payer: Self-pay

## 2019-11-12 ENCOUNTER — Ambulatory Visit
Admission: RE | Admit: 2019-11-12 | Discharge: 2019-11-12 | Disposition: A | Discharge status: Home / Self Care | Payer: PPO | Source: Ambulatory Visit | Attending: Radiation Oncology | Admitting: Radiation Oncology

## 2019-11-12 DIAGNOSIS — R300 Dysuria: Secondary | ICD-10-CM | POA: Diagnosis not present

## 2019-11-12 DIAGNOSIS — C61 Malignant neoplasm of prostate: Secondary | ICD-10-CM | POA: Insufficient documentation

## 2019-12-01 DIAGNOSIS — I10 Essential (primary) hypertension: Secondary | ICD-10-CM | POA: Diagnosis not present

## 2019-12-01 DIAGNOSIS — J439 Emphysema, unspecified: Secondary | ICD-10-CM | POA: Diagnosis not present

## 2019-12-01 DIAGNOSIS — I2510 Atherosclerotic heart disease of native coronary artery without angina pectoris: Secondary | ICD-10-CM | POA: Diagnosis not present

## 2019-12-01 DIAGNOSIS — C61 Malignant neoplasm of prostate: Secondary | ICD-10-CM | POA: Diagnosis not present

## 2019-12-01 DIAGNOSIS — N401 Enlarged prostate with lower urinary tract symptoms: Secondary | ICD-10-CM | POA: Diagnosis not present

## 2019-12-01 DIAGNOSIS — M8580 Other specified disorders of bone density and structure, unspecified site: Secondary | ICD-10-CM | POA: Diagnosis not present

## 2019-12-01 DIAGNOSIS — E7800 Pure hypercholesterolemia, unspecified: Secondary | ICD-10-CM | POA: Diagnosis not present

## 2019-12-01 DIAGNOSIS — F319 Bipolar disorder, unspecified: Secondary | ICD-10-CM | POA: Diagnosis not present

## 2019-12-16 ENCOUNTER — Telehealth: Payer: Self-pay

## 2019-12-16 NOTE — Telephone Encounter (Signed)
Left voicemail message for patient to call back in regards to telephone appointment with Freeman Caldron PA on 12/18/19 at 1:00pm. Called to review meaningful use, AUA and prostate questions. TM

## 2019-12-16 NOTE — Telephone Encounter (Signed)
Left patient a voicemail message to call back in regards to telephone visit with Ashlyn Bruning PA on 12/18/19 at 1:00pm. Called to review meaningful use, AUA and prostate questions. TM

## 2019-12-17 ENCOUNTER — Ambulatory Visit: Payer: Self-pay | Admitting: Urology

## 2019-12-18 ENCOUNTER — Ambulatory Visit
Admission: RE | Admit: 2019-12-18 | Discharge: 2019-12-18 | Disposition: A | Discharge status: Home / Self Care | Payer: PPO | Source: Ambulatory Visit | Attending: Urology | Admitting: Urology

## 2019-12-18 ENCOUNTER — Encounter: Payer: Self-pay | Admitting: Urology

## 2019-12-18 ENCOUNTER — Other Ambulatory Visit: Payer: Self-pay

## 2019-12-18 DIAGNOSIS — C61 Malignant neoplasm of prostate: Secondary | ICD-10-CM

## 2019-12-18 NOTE — Progress Notes (Signed)
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  The patient was notified in advance and gave permission to proceed with this visit format. He tolerated radiation treatment relatively well.  He experienced some moderate urinary irritation and modest fatigue. During the course of treatment he developed severe dysuria, UUI and rectal burning/pressure. This was managed with AZO and increased dose of Flomax with significant improvement. He was also given a course of abx that he completed as prescribed. Urine culture returned no growth. By completion of treatment, the dysuria and urgency with leakage had resolved and the flow of stream had improved with nocturia x1 only.                              On review of systems, the patient states that he is doing well in general.  He reports gradual improvement in the LUTS, particularly over the past 2 weeks.  He has continued taking Flomax as prescribed and specifically denies any further dysuria, leakage, straining to void, incomplete emptying or gross hematuria.  He continues with mild increased frequency and urgency but significantly improved which he is quite pleased with.  His current IPSS score is 14, indicating mild to moderate urinary symptoms.  He denies any abdominal pain, nausea, vomiting or diarrhea.  He has continued with  some intermittent constipation but is managing this with MiraLAX and/or Senokot as needed.  He reports a healthy appetite and is maintaining his weight.  Overall, he is quite pleased with his progress to date.  ALLERGIES:  has No Known Allergies.  Meds: Current Outpatient Medications  Medication Sig Dispense Refill  . acetaminophen (TYLENOL) 500 MG tablet Take 500-1,000 mg by mouth every 6 (six) hours as needed (for pain.).    Marland Kitchen amLODipine (NORVASC) 10 MG tablet Take 10 mg by mouth daily.     . Ascorbic Acid (VITAMIN C) 1000 MG tablet Take 1,000 mg by mouth daily.     Marland Kitchen aspirin 325 MG tablet Take 162.5 mg by mouth daily.     Marland Kitchen atorvastatin (LIPITOR) 40 MG tablet Take 40 mg by mouth daily.    Marland Kitchen b complex vitamins tablet Take 1 tablet by mouth daily.     Marland Kitchen buPROPion (WELLBUTRIN XL) 150 MG 24 hr tablet Take 450 mg by mouth daily.    . Calcium Carb-Cholecalciferol (CALCIUM 600+D3 PO) Take 2 tablets by mouth every evening.    . cholecalciferol (VITAMIN D) 1000 UNITS tablet Take 1,000 Units by mouth daily.     . clonazePAM (KLONOPIN) 1 MG tablet Take 1 mg by mouth See admin instructions. Take 1 tablet (1 mg) by mouth daily if needed for anxiety & take 1 tablet (1 mg) by mouth scheduled at bedtime.    . diphenhydrAMINE (BENADRYL) 25  mg capsule Take 25 mg by mouth every 6 (six) hours as needed for itching or allergies.     . famotidine (PEPCID) 20 MG tablet Take 20 mg by mouth daily.    . fentaNYL (DURAGESIC) 75 MCG/HR Place 1 patch onto the skin every other day.    . furosemide (LASIX) 40 MG tablet Take 80 mg by mouth daily as needed (fluid retention.).     Marland Kitchen gabapentin (NEURONTIN) 300 MG capsule Take 600 mg by mouth at bedtime.    Marland Kitchen ibuprofen (ADVIL,MOTRIN) 200 MG tablet Take 200-400 mg by mouth every 8 (eight) hours as needed (pain.). For pain     . magnesium oxide (MAG-OX) 400 MG tablet Take 800 mg by mouth every evening.    . methocarbamol (ROBAXIN) 750 MG tablet Take 750 mg by mouth every 8  (eight) hours.    . Multiple Vitamin (MULTIVITAMIN WITH MINERALS) TABS tablet Take 1 tablet by mouth daily.    Marland Kitchen nystatin (MYCOSTATIN/NYSTOP) powder Apply 1 application topically 2 (two) times daily as needed (skin irritation/rash.).     Marland Kitchen Omega-3 Fatty Acids (FISH OIL) 1200 MG CAPS Take 2,400 mg by mouth every evening.    . Potassium (GNP POTASSIUM) 99 MG TABS Take 198 mg by mouth every evening.     . pregabalin (LYRICA) 150 MG capsule Take 300 mg by mouth at bedtime.    . pseudoephedrine (SUDAFED) 30 MG tablet Take 60 mg by mouth every 4 (four) hours as needed for congestion.    . QUEtiapine (SEROQUEL) 100 MG tablet Take 1 tablet (100 mg total) by mouth at bedtime.    . quinapril (ACCUPRIL) 40 MG tablet Take 80 mg by mouth daily.     . tamsulosin (FLOMAX) 0.4 MG CAPS capsule Take 0.4 mg by mouth daily.    . traZODone (DESYREL) 100 MG tablet Take 1 tablet (100 mg total) by mouth at bedtime.    . Venlafaxine HCl 75 MG TB24 Take 225 mg by mouth daily.    . vitamin E 180 MG (400 UNITS) capsule Take 400 Units by mouth daily.    . RESTASIS 0.05 % ophthalmic emulsion Place 1 drop into both eyes 2 (two) times daily as needed (dry/irritated eyes.).  (Patient not taking: Reported on 12/18/2019)    . sulfamethoxazole-trimethoprim (BACTRIM DS) 800-160 MG tablet Take 1 tablet by mouth 2 (two) times daily. (Patient not taking: Reported on 12/18/2019) 14 tablet 0  . terbinafine (LAMISIL) 250 MG tablet Take 250 mg by mouth daily. (Patient not taking: Reported on 12/18/2019)     No current facility-administered medications for this encounter.    Physical Findings:  vitals were not taken for this visit.   /Unable to assess due to telephone follow up visit format.  Lab Findings: Lab Results  Component Value Date   WBC 6.9 09/04/2019   HGB 16.0 09/04/2019   HCT 50.6 09/04/2019   MCV 88.5 09/04/2019   PLT 142 (L) 09/04/2019     Radiographic Findings: No results found.  Impression/Plan: 1.  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 Diona Fanti on 01/23/20. He understands what to expect with regards to PSA monitoring going forward. I will look forward to following his response to treatment via correspondence with urology, and would be happy to continue to participate in his care  if clinically indicated. I talked to the patient about what to expect in the future, including his risk for erectile dysfunction and rectal bleeding. I encouraged him to call or return to the office if he has any questions regarding his previous radiation or possible radiation side effects. He was comfortable with this plan and will follow up as needed.    Nicholos Johns, PA-C

## 2019-12-18 NOTE — Progress Notes (Signed)
Patients meaningful use is done . Patient is aware that this is a phone visit. Patient states that he empties his bladder most of the time. Reports nocturia x3. Denies any issues with his bowels. Denies any leakage,dysuria or hematuria.States that he has urgency sometime.States that he will see his urologist in the next two months not sure of date. IPPS score was 14

## 2019-12-18 NOTE — Progress Notes (Signed)
  Radiation Oncology         [phone]) [phone] ________________________________  Name: Michael Lutz MRN: [id number]  Date: 12/18/2019  DOB: 1945/11/16  End of Treatment Note  Diagnosis:   73 y.o. gentleman with Stage T1c adenocarcinoma of the prostate with Gleason score of 4+3, and PSA of 7.76     Indication for treatment:  Curative, Definitive Radiotherapy       Radiation treatment dates:   10/06/19 - 11/12/19  Site/dose:   The prostate was treated to 70 Gy in 28 fractions of 2.5 Gy  Beams/energy:   The patient was treated with IMRT using volumetric arc therapy delivering 6 MV X-rays to clockwise and counterclockwise circumferential arcs with a 90 degree collimator offset to avoid dose scalloping.  Image guidance was performed with daily cone beam CT prior to each fraction to align to gold markers in the prostate and assure proper bladder and rectal fill volumes.  Immobilization was achieved with BodyFix custom mold.  Narrative: The patient tolerated radiation treatment relatively well.   He experienced some moderate urinary irritation and modest fatigue.  During the course of treatment he developed severe dysuria, UUI and rectal burning/pressure. This was managed with AZO and increased dose of Flomax with significant improvement. He was also given a course of abx that he completed as prescribed. Urine culture returned no growth. By completion of treatment, the dysuria and urgency with leakage had resolved and the flow of stream had improved with nocturia x1 only.  Plan: The patient has completed radiation treatment. He will return to radiation oncology clinic for routine followup in one month. I advised him to call or return sooner if he has any questions or concerns related to his recovery or treatment. ________________________________  Sheral Apley. Tammi Klippel, M.D.

## 2019-12-22 DIAGNOSIS — I2510 Atherosclerotic heart disease of native coronary artery without angina pectoris: Secondary | ICD-10-CM | POA: Diagnosis not present

## 2019-12-22 DIAGNOSIS — F319 Bipolar disorder, unspecified: Secondary | ICD-10-CM | POA: Diagnosis not present

## 2019-12-22 DIAGNOSIS — E7800 Pure hypercholesterolemia, unspecified: Secondary | ICD-10-CM | POA: Diagnosis not present

## 2019-12-22 DIAGNOSIS — M8580 Other specified disorders of bone density and structure, unspecified site: Secondary | ICD-10-CM | POA: Diagnosis not present

## 2019-12-22 DIAGNOSIS — J439 Emphysema, unspecified: Secondary | ICD-10-CM | POA: Diagnosis not present

## 2019-12-22 DIAGNOSIS — I10 Essential (primary) hypertension: Secondary | ICD-10-CM | POA: Diagnosis not present

## 2019-12-22 DIAGNOSIS — N401 Enlarged prostate with lower urinary tract symptoms: Secondary | ICD-10-CM | POA: Diagnosis not present

## 2019-12-22 DIAGNOSIS — C61 Malignant neoplasm of prostate: Secondary | ICD-10-CM | POA: Diagnosis not present

## 2019-12-29 DIAGNOSIS — M62838 Other muscle spasm: Secondary | ICD-10-CM | POA: Diagnosis not present

## 2019-12-29 DIAGNOSIS — M961 Postlaminectomy syndrome, not elsewhere classified: Secondary | ICD-10-CM | POA: Diagnosis not present

## 2019-12-29 DIAGNOSIS — Z79891 Long term (current) use of opiate analgesic: Secondary | ICD-10-CM | POA: Diagnosis not present

## 2019-12-29 DIAGNOSIS — G894 Chronic pain syndrome: Secondary | ICD-10-CM | POA: Diagnosis not present

## 2020-01-20 ENCOUNTER — Other Ambulatory Visit: Payer: Self-pay | Admitting: Family Medicine

## 2020-01-20 DIAGNOSIS — Z87891 Personal history of nicotine dependence: Secondary | ICD-10-CM

## 2020-01-21 ENCOUNTER — Other Ambulatory Visit: Payer: Self-pay

## 2020-01-21 DIAGNOSIS — I739 Peripheral vascular disease, unspecified: Secondary | ICD-10-CM

## 2020-01-21 DIAGNOSIS — I10 Essential (primary) hypertension: Secondary | ICD-10-CM | POA: Diagnosis not present

## 2020-01-21 DIAGNOSIS — N401 Enlarged prostate with lower urinary tract symptoms: Secondary | ICD-10-CM | POA: Diagnosis not present

## 2020-01-21 DIAGNOSIS — J439 Emphysema, unspecified: Secondary | ICD-10-CM | POA: Diagnosis not present

## 2020-01-21 DIAGNOSIS — E7800 Pure hypercholesterolemia, unspecified: Secondary | ICD-10-CM | POA: Diagnosis not present

## 2020-01-21 DIAGNOSIS — M8580 Other specified disorders of bone density and structure, unspecified site: Secondary | ICD-10-CM | POA: Diagnosis not present

## 2020-01-21 DIAGNOSIS — I2510 Atherosclerotic heart disease of native coronary artery without angina pectoris: Secondary | ICD-10-CM | POA: Diagnosis not present

## 2020-01-21 DIAGNOSIS — F319 Bipolar disorder, unspecified: Secondary | ICD-10-CM | POA: Diagnosis not present

## 2020-01-21 DIAGNOSIS — C61 Malignant neoplasm of prostate: Secondary | ICD-10-CM | POA: Diagnosis not present

## 2020-01-21 DIAGNOSIS — I714 Abdominal aortic aneurysm, without rupture, unspecified: Secondary | ICD-10-CM

## 2020-01-23 DIAGNOSIS — Z8546 Personal history of malignant neoplasm of prostate: Secondary | ICD-10-CM | POA: Diagnosis not present

## 2020-02-03 ENCOUNTER — Ambulatory Visit (HOSPITAL_COMMUNITY)
Admission: RE | Admit: 2020-02-03 | Discharge: 2020-02-03 | Disposition: A | Discharge status: Home / Self Care | Payer: PPO | Source: Ambulatory Visit | Attending: Physician Assistant | Admitting: Physician Assistant

## 2020-02-03 ENCOUNTER — Ambulatory Visit (INDEPENDENT_AMBULATORY_CARE_PROVIDER_SITE_OTHER): Payer: PPO | Admitting: Physician Assistant

## 2020-02-03 ENCOUNTER — Ambulatory Visit (INDEPENDENT_AMBULATORY_CARE_PROVIDER_SITE_OTHER)
Admission: RE | Admit: 2020-02-03 | Discharge: 2020-02-03 | Disposition: A | Discharge status: Home / Self Care | Payer: PPO | Source: Ambulatory Visit | Attending: Physician Assistant | Admitting: Physician Assistant

## 2020-02-03 ENCOUNTER — Other Ambulatory Visit: Payer: Self-pay

## 2020-02-03 VITALS — BP 135/83 | HR 69 | Temp 98.0°F | Ht 68.0 in | Wt 276.6 lb

## 2020-02-03 DIAGNOSIS — I739 Peripheral vascular disease, unspecified: Secondary | ICD-10-CM | POA: Diagnosis not present

## 2020-02-03 DIAGNOSIS — I714 Abdominal aortic aneurysm, without rupture, unspecified: Secondary | ICD-10-CM

## 2020-02-03 DIAGNOSIS — Z95828 Presence of other vascular implants and grafts: Secondary | ICD-10-CM

## 2020-02-03 NOTE — Progress Notes (Signed)
HISTORY AND PHYSICAL     CC:  follow up. Requesting Provider:  Kathyrn Lass, MD  HPI: This is a 74 y.o. male who is here today for follow up for PAD.  He is s/p EVAR in September 2011 by Dr. Oneida Alar  Pt was last seen 12/04/2018 and at that time, he was doing well but did have chronic back pain from spine issues.   The pt returns today for follow up.  He states that he has some pain in his right calf and right foot at night.  He states it feels better to stomp his foot on the floor.  He denies any pain with walking.  He does not have the foot pain when he is sitting in his recliner.  He does have hx of right foot surgeries in the past.  He states he has had some sharp right sided, lower quadrant sharp abdominal pain the past month with bending over.  This is not continuous.    The pt is on a statin for cholesterol management.    The pt is on an aspirin.    Other AC:  none The pt is on CCB, ACEI  for hypertension.  The pt does not have diabetes. Tobacco hx:  former    Past Medical History:  Diagnosis Date  . AAA (abdominal aortic aneurysm) ([place])   . ADD (attention deficit disorder)   . Anxiety    takes Clonazepam daily  . Arthritis    left knee  . Bipolar 1 disorder ([place])    takes Seroquel nightly  . Bipolar affective disorder ([place])    in remission on meds  . Blood transfusion    several times  . CHF (congestive heart failure) ([place])   . Chronic back pain    [facility]  . Chronic back pain    scoliosis  . Chronic low back pain 07/31/2013  . Colon polyps    Dr Cristina Gong does a colonoscopy every 5 years  . Constipation    takes Colace prn  . Depression    takes Effexor daily  . Dysrhythmia    palpitations- sees dr Irish Lack  . Enlarged prostate    slightly  . Full dentures   . GERD (gastroesophageal reflux disease)    takes Zantac daily  . Heart murmur    arrythmia  . History of shingles   . Hx of scabies Mid Feb 2013  . Hx of seasonal allergies    . Hyperlipidemia    takes Niacin daily and Lipitor daily  . Hypertension    takes Amlodipine and Quinapril daily  . Insomnia    takes Trazodone nightly  . Joint pain   . Leg pain 05-04-10   with walking  . Memory loss    short and long term  . Nocturia   . Peripheral edema    takes Furosemide prn  . Peripheral vascular disease ([place])    s/p AAA stent  . Prostate cancer ([place])   . Urinary urgency   . Wears glasses     Past Surgical History:  Procedure Laterality Date  . ABDOMINAL AORTIC ANEURYSM REPAIR  11/2009   Stent graft repair  . BACK SURGERY     thoracic spine  . CARDIAC CATHETERIZATION     15 yrs ago  . CHOLECYSTECTOMY    . COLONOSCOPY    . FOOT SURGERY     Multiple surgeries on right foot  . GANGLION CYST EXCISION Right 06/07/2012   Procedure:  DISTAL POLE EXCISION RIGHT WRIST;  Surgeon: Wynonia Sours, MD;  Location: [facility];  Service: Orthopedics;  Laterality: Right;  . GOLD SEED IMPLANT N/A 09/08/2019   Procedure: GOLD SEED IMPLANT;  Surgeon: Franchot Gallo, MD;  Location: [facility];  Service: Urology;  Laterality: N/A;  30 MINS  . SHOULDER SURGERY     Right shoulder  . SPACE OAR INSTILLATION N/A 09/08/2019   Procedure: SPACE OAR INSTILLATION;  Surgeon: Franchot Gallo, MD;  Location: [facility];  Service: Urology;  Laterality: N/A;  . SPINAL CORD STIMULATOR IMPLANT  2009   NOVI [id number]  MRI UNSAFE  . SPINAL FUSION  04/04/2011   Procedure: FUSION POSTERIOR SPINAL MULTILEVEL/SCOLIOSIS;  Surgeon: Gunnar Bulla, MD;  Location: [facility];  Service: Orthopedics;  Laterality: N/A;  repair thoracic and lumbar  pseudoarthrosis, revise spinal cord stimulator, extend thoracic fusion, iliac crest bone graft  . SPINE SURGERY  12/2009   Spinal Fusion by Dr. Marcial Pacas  . TESTICLE SURGERY     right testicle  . TONSILLECTOMY    . TURP VAPORIZATION    . VASCULAR SURGERY     AAA stent repair    No Known Allergies  Current Outpatient Medications  Medication Sig  Dispense Refill  . acetaminophen (TYLENOL) 500 MG tablet Take 500-1,000 mg by mouth every 6 (six) hours as needed (for pain.).    Marland Kitchen amLODipine (NORVASC) 10 MG tablet Take 10 mg by mouth daily.     . Ascorbic Acid (VITAMIN C) 1000 MG tablet Take 1,000 mg by mouth daily.     Marland Kitchen aspirin 325 MG tablet Take 162.5 mg by mouth daily.     Marland Kitchen atorvastatin (LIPITOR) 40 MG tablet Take 40 mg by mouth daily.    Marland Kitchen b complex vitamins tablet Take 1 tablet by mouth daily.     Marland Kitchen buPROPion (WELLBUTRIN XL) 150 MG 24 hr tablet Take 450 mg by mouth daily.    . Calcium Carb-Cholecalciferol (CALCIUM 600+D3 PO) Take 2 tablets by mouth every evening.    . cholecalciferol (VITAMIN D) 1000 UNITS tablet Take 1,000 Units by mouth daily.     . clonazePAM (KLONOPIN) 1 MG tablet Take 1 mg by mouth See admin instructions. Take 1 tablet (1 mg) by mouth daily if needed for anxiety & take 1 tablet (1 mg) by mouth scheduled at bedtime.    . diphenhydrAMINE (BENADRYL) 25 mg capsule Take 25 mg by mouth every 6 (six) hours as needed for itching or allergies.     . famotidine (PEPCID) 20 MG tablet Take 20 mg by mouth daily.    . fentaNYL (DURAGESIC) 75 MCG/HR Place 1 patch onto the skin every other day.    . furosemide (LASIX) 40 MG tablet Take 80 mg by mouth daily as needed (fluid retention.).     Marland Kitchen gabapentin (NEURONTIN) 300 MG capsule Take 600 mg by mouth at bedtime.    Marland Kitchen ibuprofen (ADVIL,MOTRIN) 200 MG tablet Take 200-400 mg by mouth every 8 (eight) hours as needed (pain.). For pain     . magnesium oxide (MAG-OX) 400 MG tablet Take 800 mg by mouth every evening.    . methocarbamol (ROBAXIN) 750 MG tablet Take 750 mg by mouth every 8 (eight) hours.    . Multiple Vitamin (MULTIVITAMIN WITH MINERALS) TABS tablet Take 1 tablet by mouth daily.    Marland Kitchen nystatin (MYCOSTATIN/NYSTOP) powder Apply 1 application topically 2 (two) times daily as needed (skin irritation/rash.).     Marland Kitchen Omega-3 Fatty  Acids (FISH OIL) 1200 MG CAPS Take 2,400 mg by mouth  every evening.    . Potassium (GNP POTASSIUM) 99 MG TABS Take 198 mg by mouth every evening.     . pregabalin (LYRICA) 150 MG capsule Take 300 mg by mouth at bedtime.    . pseudoephedrine (SUDAFED) 30 MG tablet Take 60 mg by mouth every 4 (four) hours as needed for congestion.    . QUEtiapine (SEROQUEL) 100 MG tablet Take 1 tablet (100 mg total) by mouth at bedtime.    . quinapril (ACCUPRIL) 40 MG tablet Take 80 mg by mouth daily.     . RESTASIS 0.05 % ophthalmic emulsion Place 1 drop into both eyes 2 (two) times daily as needed (dry/irritated eyes.).  (Patient not taking: Reported on 12/18/2019)    . sulfamethoxazole-trimethoprim (BACTRIM DS) 800-160 MG tablet Take 1 tablet by mouth 2 (two) times daily. (Patient not taking: Reported on 12/18/2019) 14 tablet 0  . tamsulosin (FLOMAX) 0.4 MG CAPS capsule Take 0.4 mg by mouth daily.    Marland Kitchen terbinafine (LAMISIL) 250 MG tablet Take 250 mg by mouth daily. (Patient not taking: Reported on 12/18/2019)    . traZODone (DESYREL) 100 MG tablet Take 1 tablet (100 mg total) by mouth at bedtime.    . Venlafaxine HCl 75 MG TB24 Take 225 mg by mouth daily.    . vitamin E 180 MG (400 UNITS) capsule Take 400 Units by mouth daily.     No current facility-administered medications for this visit.    Family History  Problem Relation Age of Onset  . Melanoma Mother   . Diabetes Father   . Heart attack Father   . Prostate cancer Father   . Arthritis Sister        rheumatoid  . Breast cancer Sister   . Breast cancer Maternal Grandmother   . Anesthesia problems Neg Hx   . Hypotension Neg Hx   . Malignant hyperthermia Neg Hx   . Pseudochol deficiency Neg Hx   . Pancreatic cancer Neg Hx   . Colon cancer Neg Hx     Social History   Socioeconomic History  . Marital status: Single    Spouse name: Not on file  . Number of children: Not on file  . Years of education: Not on file  . Highest education level: Not on file  Occupational History  . Occupation:  Retired  Tobacco Use  . Smoking status: Former Smoker    Packs/day: 1.00    Years: 50.00    Pack years: 50.00    Types: Cigarettes    Quit date: 10/08/2013    Years since quitting: 6.3  . Smokeless tobacco: Never Used  . Tobacco comment: states smokes "little cigars" - former  Vaping Use  . Vaping Use: Never used  Substance and Sexual Activity  . Alcohol use: No    Alcohol/week: 0.0 standard drinks  . Drug use: No  . Sexual activity: Not Currently  Other Topics Concern  . Not on file  Social History Narrative  . Not on file   Social Determinants of Health   Financial Resource Strain:   . Difficulty of Paying Living Expenses: Not on file  Food Insecurity:   . Worried About Charity fundraiser in the Last Year: Not on file  . Ran Out of Food in the Last Year: Not on file  Transportation Needs:   . Lack of Transportation (Medical): Not on file  . Lack of Transportation (Non-Medical):  Not on file  Physical Activity:   . Days of Exercise per Week: Not on file  . Minutes of Exercise per Session: Not on file  Stress:   . Feeling of Stress : Not on file  Social Connections:   . Frequency of Communication with Friends and Family: Not on file  . Frequency of Social Gatherings with Friends and Family: Not on file  . Attends Religious Services: Not on file  . Active Member of Clubs or Organizations: Not on file  . Attends Archivist Meetings: Not on file  . Marital Status: Not on file  Intimate Partner Violence:   . Fear of Current or Ex-Partner: Not on file  . Emotionally Abused: Not on file  . Physically Abused: Not on file  . Sexually Abused: Not on file     REVIEW OF SYSTEMS:   [X]  denotes positive finding, [ ]  denotes negative finding Cardiac  Comments:  Chest pain or chest pressure:    Shortness of breath upon exertion: x   Short of breath when lying flat:    Irregular heart rhythm:        Vascular    Pain in calf, thigh, or hip brought on by  ambulation: x See HPI  Pain in feet at night that wakes you up from your sleep:  x See HPI  Blood clot in your veins:    Leg swelling:         Pulmonary    Oxygen at home:    Productive cough:  x   Wheezing:         Neurologic    Sudden weakness in arms or legs:     Sudden numbness in arms or legs:     Sudden onset of difficulty speaking or slurred speech:    Temporary loss of vision in one eye:     Problems with dizziness:         Gastrointestinal    Blood in stool:     Vomited blood:         Genitourinary    Burning when urinating:     Blood in urine:        Psychiatric    Major depression:         Hematologic    Bleeding problems:    Problems with blood clotting too easily:        Skin    Rashes or ulcers:        Constitutional    Fever or chills:      PHYSICAL EXAMINATION:  Today's Vitals   02/03/20 1019  BP: 135/83  Pulse: 69  Temp: 98 F (36.7 C)  TempSrc: Skin  SpO2: (!) 88%  Weight: 276 lb 9.6 oz (125.5 kg)  Height: 5\' 8"  (1.727 m)   Body mass index is 42.06 kg/m.   General:  WDWN in NAD; vital signs documented above Gait: Not observed HENT: WNL, normocephalic Pulmonary: normal non-labored breathing , without wheezing Cardiac: regular HR, without  Murmur; without carotid bruits Abdomen: soft, NT, no masses; aortic pulse is not palpable Skin: without rashes Vascular Exam/Pulses:  Right Left  Radial 2+ (normal) 2+ (normal)  Femoral Unable to palpate due to body habitus Unable to palpate due to body habitus  Popliteal Unable to palpate Unable to palpate  DP monophasic monophasic  PT monophasic monophasic  Peroneal monophasic Brisk biphasic    Extremities: without ischemic changes, without Gangrene , without cellulitis; without open wounds; +BLE with hemosiderin changes  bilaterally with left>right Musculoskeletal: no muscle wasting or atrophy  Neurologic: A&O X 3;  No focal weakness or paresthesias are detected Psychiatric:  The pt has  Normal affect.   Non-Invasive Vascular Imaging:   ABI's/TBI's on 02/03/2020: Right:  0.89/0.77 - Great toe pressure: 113 Left:  0.86/0.70 - Great toe pressure: 102  EVAR duplex 02/03/2020: Abdominal Aorta Findings:  +--------+-------+----------+----------+--------+--------+--------+  LocationAP (cm)Trans (cm)PSV (cm/s)WaveformThrombusComments  +--------+-------+----------+----------+--------+--------+--------+  Proximal3.66  3.22   68                  +--------+-------+----------+----------+--------+--------+--------+   Endovascular Aortic Repair (EVAR):  +----------+----------------+-------------------+-------------------+       Diameter AP (cm)Diameter Trans (cm)Velocities (cm/sec)  +----------+----------------+-------------------+-------------------+  Aorta   4.61      4.52        58           +----------+----------------+-------------------+-------------------+  Right Limb1.24      1.16        42           +----------+----------------+-------------------+-------------------+  Left Limb 1.54      1.71        41           +----------+----------------+-------------------+-------------------+   Previous ABI's/TBI's on 05/21/2019: Right:  1.03/0.98 - Great toe pressure: 142 Left:  1.10/1.08 - Great toe pressure:  156  Previous arterial duplex on 12/04/2018: Endovascular Aortic Repair (EVAR):  +----------+----------------+-------------------+-------------------+       Diameter AP (cm)Diameter Trans (cm)Velocities (cm/sec)  +----------+----------------+-------------------+-------------------+  Aorta   2.80      2.40        46           +----------+----------------+-------------------+-------------------+  Right Limb1.39      1.63        58            +----------+----------------+-------------------+-------------------+  Left Limb 1.54      1.65        67           +----------+----------------+-------------------+-------------------+   ASSESSMENT/PLAN:: 74 y.o. male here for follow up EVAR in September 2011 by Dr. Oneida Alar  AAA -Pt's duplex today shows increase in aortic diameter from 2.8 to 4.6cm.  He had some RLQ abdominal pain with bending over in the past month.  I am unable to reproduce this on palpation and this is not continuous.  If he was having pain from his aorta, I would expect this to be diffuse throughout his abdomen and constant. I discussed the pt's duplex with Dr. Carlis Abbott and will have pt get CTA of abdomen and pelvis and f/u in the next couple of weeks.  Dr. Oneida Alar does not have availability until late January.  I discussed with Dr. Stanford Breed and he will see him back and review the scans with him.   PAD -pt has c/o right foot and calf pain today at night.  He does not get these pains when he is sitting in his recliner or with walking.   His ABI is slightly decreased from back in March, but he still has good toe pressures.  I do not think this is related to blood flow.  He does not have any non healing wounds.    Carotid stenosis -pt had carotid duplex September 2020 that revealed 1-39% bilateral ICA stenosis.   -continue statin/asa  Leontine Locket, [facility] [phone]  Clinic MD:   Carlis Abbott

## 2020-02-04 ENCOUNTER — Other Ambulatory Visit: Payer: Self-pay

## 2020-02-04 DIAGNOSIS — I714 Abdominal aortic aneurysm, without rupture, unspecified: Secondary | ICD-10-CM

## 2020-02-09 ENCOUNTER — Ambulatory Visit
Admission: RE | Admit: 2020-02-09 | Discharge: 2020-02-09 | Disposition: A | Discharge status: Home / Self Care | Payer: PPO | Source: Ambulatory Visit | Attending: Family Medicine | Admitting: Family Medicine

## 2020-02-09 DIAGNOSIS — F17210 Nicotine dependence, cigarettes, uncomplicated: Secondary | ICD-10-CM | POA: Diagnosis not present

## 2020-02-09 DIAGNOSIS — J439 Emphysema, unspecified: Secondary | ICD-10-CM | POA: Diagnosis not present

## 2020-02-09 DIAGNOSIS — Z87891 Personal history of nicotine dependence: Secondary | ICD-10-CM

## 2020-02-09 DIAGNOSIS — Z8546 Personal history of malignant neoplasm of prostate: Secondary | ICD-10-CM | POA: Diagnosis not present

## 2020-02-09 DIAGNOSIS — I2510 Atherosclerotic heart disease of native coronary artery without angina pectoris: Secondary | ICD-10-CM | POA: Diagnosis not present

## 2020-02-17 ENCOUNTER — Telehealth: Payer: Self-pay

## 2020-02-17 NOTE — Telephone Encounter (Signed)
Left VM with patient confirming CT and f/u appt times.

## 2020-02-18 DIAGNOSIS — E7800 Pure hypercholesterolemia, unspecified: Secondary | ICD-10-CM | POA: Diagnosis not present

## 2020-02-18 DIAGNOSIS — F319 Bipolar disorder, unspecified: Secondary | ICD-10-CM | POA: Diagnosis not present

## 2020-02-18 DIAGNOSIS — I2510 Atherosclerotic heart disease of native coronary artery without angina pectoris: Secondary | ICD-10-CM | POA: Diagnosis not present

## 2020-02-18 DIAGNOSIS — M8580 Other specified disorders of bone density and structure, unspecified site: Secondary | ICD-10-CM | POA: Diagnosis not present

## 2020-02-18 DIAGNOSIS — J439 Emphysema, unspecified: Secondary | ICD-10-CM | POA: Diagnosis not present

## 2020-02-18 DIAGNOSIS — C61 Malignant neoplasm of prostate: Secondary | ICD-10-CM | POA: Diagnosis not present

## 2020-02-18 DIAGNOSIS — N401 Enlarged prostate with lower urinary tract symptoms: Secondary | ICD-10-CM | POA: Diagnosis not present

## 2020-02-18 DIAGNOSIS — I10 Essential (primary) hypertension: Secondary | ICD-10-CM | POA: Diagnosis not present

## 2020-02-20 ENCOUNTER — Ambulatory Visit (HOSPITAL_COMMUNITY)
Admission: RE | Admit: 2020-02-20 | Discharge: 2020-02-20 | Disposition: A | Discharge status: Home / Self Care | Payer: PPO | Source: Ambulatory Visit | Attending: Vascular Surgery | Admitting: Vascular Surgery

## 2020-02-20 ENCOUNTER — Other Ambulatory Visit: Payer: Self-pay

## 2020-02-20 DIAGNOSIS — I358 Other nonrheumatic aortic valve disorders: Secondary | ICD-10-CM | POA: Diagnosis not present

## 2020-02-20 DIAGNOSIS — I712 Thoracic aortic aneurysm, without rupture: Secondary | ICD-10-CM | POA: Diagnosis not present

## 2020-02-20 DIAGNOSIS — N400 Enlarged prostate without lower urinary tract symptoms: Secondary | ICD-10-CM | POA: Diagnosis not present

## 2020-02-20 DIAGNOSIS — I714 Abdominal aortic aneurysm, without rupture, unspecified: Secondary | ICD-10-CM

## 2020-02-20 MED ORDER — IOHEXOL 350 MG/ML SOLN: 75.0000 mL | Freq: Once | INTRAVENOUS | Status: DC | PRN

## 2020-02-20 MED ORDER — IOHEXOL 350 MG/ML SOLN
100.0000 mL | Freq: Once | INTRAVENOUS | Status: AC | PRN
  Administered 2020-02-20: 100 mL via INTRAVENOUS

## 2020-02-24 ENCOUNTER — Other Ambulatory Visit: Payer: Self-pay

## 2020-02-24 ENCOUNTER — Encounter: Payer: Self-pay | Admitting: Vascular Surgery

## 2020-02-24 ENCOUNTER — Ambulatory Visit (INDEPENDENT_AMBULATORY_CARE_PROVIDER_SITE_OTHER): Payer: PPO | Admitting: Vascular Surgery

## 2020-02-24 VITALS — BP 168/102 | HR 68 | Temp 98.1°F | Resp 20 | Ht 68.0 in | Wt 276.0 lb

## 2020-02-24 DIAGNOSIS — Z9889 Other specified postprocedural states: Secondary | ICD-10-CM | POA: Diagnosis not present

## 2020-02-24 DIAGNOSIS — Z8679 Personal history of other diseases of the circulatory system: Secondary | ICD-10-CM | POA: Diagnosis not present

## 2020-02-24 NOTE — Progress Notes (Signed)
HISTORY AND PHYSICAL     CC:  follow up. Requesting Provider:  Kathyrn Lass, MD  HPI: This is a 74 y.o. male who is here today for follow up for PAD.  He is s/p EVAR in September 2011 by Dr. Oneida Alar  Pt was last seen 12/04/2018 and at that time, he was doing well but did have chronic back pain from spine issues.   The pt returns today for follow up.  He states that he has some pain in his right calf and right foot at night.  He states it feels better to stomp his foot on the floor.  He denies any pain with walking.  He does not have the foot pain when he is sitting in his recliner.  He does have hx of right foot surgeries in the past.  He states he has had some sharp right sided, lower quadrant sharp abdominal pain the past month with bending over.  This is not continuous.    The pt is on a statin for cholesterol management.    The pt is on an aspirin.    Other AC:  none The pt is on CCB, ACEI  for hypertension.  The pt does not have diabetes. Tobacco hx:  former   INTERVAL HISTORY 02/24/20: returns with CTA A/P showing no aneurysm sac, no endoleak.    Past Medical History:  Diagnosis Date  . AAA (abdominal aortic aneurysm) ([place])   . ADD (attention deficit disorder)   . Anxiety    takes Clonazepam daily  . Arthritis    left knee  . Bipolar 1 disorder ([place])    takes Seroquel nightly  . Bipolar affective disorder ([place])    in remission on meds  . Blood transfusion    several times  . CHF (congestive heart failure) ([place])   . Chronic back pain    [facility]  . Chronic back pain    scoliosis  . Chronic low back pain 07/31/2013  . Colon polyps    Dr Cristina Gong does a colonoscopy every 5 years  . Constipation    takes Colace prn  . Depression    takes Effexor daily  . Dysrhythmia    palpitations- sees dr Irish Lack  . Enlarged prostate    slightly  . Full dentures   . GERD (gastroesophageal reflux disease)    takes Zantac daily  . Heart murmur     arrythmia  . History of shingles   . Hx of scabies Mid Feb 2013  . Hx of seasonal allergies   . Hyperlipidemia    takes Niacin daily and Lipitor daily  . Hypertension    takes Amlodipine and Quinapril daily  . Insomnia    takes Trazodone nightly  . Joint pain   . Leg pain 05-04-10   with walking  . Memory loss    short and long term  . Nocturia   . Peripheral edema    takes Furosemide prn  . Peripheral vascular disease ([place])    s/p AAA stent  . Prostate cancer ([place])   . Urinary urgency   . Wears glasses     Past Surgical History:  Procedure Laterality Date  . ABDOMINAL AORTIC ANEURYSM REPAIR  11/2009   Stent graft repair  . BACK SURGERY     thoracic spine  . CARDIAC CATHETERIZATION     15 yrs ago  . CHOLECYSTECTOMY    . COLONOSCOPY    . FOOT SURGERY  Multiple surgeries on right foot  . GANGLION CYST EXCISION Right 06/07/2012   Procedure: DISTAL POLE EXCISION RIGHT WRIST;  Surgeon: Wynonia Sours, MD;  Location: [facility];  Service: Orthopedics;  Laterality: Right;  . GOLD SEED IMPLANT N/A 09/08/2019   Procedure: GOLD SEED IMPLANT;  Surgeon: Franchot Gallo, MD;  Location: [facility];  Service: Urology;  Laterality: N/A;  30 MINS  . SHOULDER SURGERY     Right shoulder  . SPACE OAR INSTILLATION N/A 09/08/2019   Procedure: SPACE OAR INSTILLATION;  Surgeon: Franchot Gallo, MD;  Location: [facility];  Service: Urology;  Laterality: N/A;  . SPINAL CORD STIMULATOR IMPLANT  2009   NOVI [id number]  MRI UNSAFE  . SPINAL FUSION  04/04/2011   Procedure: FUSION POSTERIOR SPINAL MULTILEVEL/SCOLIOSIS;  Surgeon: Gunnar Bulla, MD;  Location: [facility];  Service: Orthopedics;  Laterality: N/A;  repair thoracic and lumbar  pseudoarthrosis, revise spinal cord stimulator, extend thoracic fusion, iliac crest bone graft  . SPINE SURGERY  12/2009   Spinal Fusion by Dr. Marcial Pacas  . TESTICLE SURGERY     right testicle  . TONSILLECTOMY    . TURP VAPORIZATION    . VASCULAR SURGERY      AAA stent repair    No Known Allergies  Current Outpatient Medications  Medication Sig Dispense Refill  . acetaminophen (TYLENOL) 500 MG tablet Take 500-1,000 mg by mouth every 6 (six) hours as needed (for pain.).    Marland Kitchen amLODipine (NORVASC) 10 MG tablet Take 10 mg by mouth daily.     . Ascorbic Acid (VITAMIN C) 1000 MG tablet Take 1,000 mg by mouth daily.     Marland Kitchen aspirin 325 MG tablet Take 162.5 mg by mouth daily.     Marland Kitchen atorvastatin (LIPITOR) 40 MG tablet Take 40 mg by mouth daily.    Marland Kitchen b complex vitamins tablet Take 1 tablet by mouth daily.     Marland Kitchen buPROPion (WELLBUTRIN XL) 150 MG 24 hr tablet Take 450 mg by mouth daily.    . Calcium Carb-Cholecalciferol (CALCIUM 600+D3 PO) Take 2 tablets by mouth every evening.    . cholecalciferol (VITAMIN D) 1000 UNITS tablet Take 1,000 Units by mouth daily.     . clonazePAM (KLONOPIN) 1 MG tablet Take 1 mg by mouth See admin instructions. Take 1 tablet (1 mg) by mouth daily if needed for anxiety & take 1 tablet (1 mg) by mouth scheduled at bedtime.    . diphenhydrAMINE (BENADRYL) 25 mg capsule Take 25 mg by mouth every 6 (six) hours as needed for itching or allergies.     . famotidine (PEPCID) 20 MG tablet Take 20 mg by mouth daily.    . fentaNYL (DURAGESIC) 75 MCG/HR Place 1 patch onto the skin every other day.    . furosemide (LASIX) 40 MG tablet Take 80 mg by mouth daily as needed (fluid retention.).     Marland Kitchen gabapentin (NEURONTIN) 300 MG capsule Take 600 mg by mouth at bedtime.    Marland Kitchen ibuprofen (ADVIL,MOTRIN) 200 MG tablet Take 200-400 mg by mouth every 8 (eight) hours as needed (pain.). For pain    . magnesium oxide (MAG-OX) 400 MG tablet Take 800 mg by mouth every evening.    . methocarbamol (ROBAXIN) 750 MG tablet Take 750 mg by mouth every 8 (eight) hours.    . Multiple Vitamin (MULTIVITAMIN WITH MINERALS) TABS tablet Take 1 tablet by mouth daily.    Marland Kitchen nystatin (MYCOSTATIN/NYSTOP) powder Apply 1 application topically 2 (  two) times daily as needed (skin  irritation/rash.).     Marland Kitchen Omega-3 Fatty Acids (FISH OIL) 1200 MG CAPS Take 2,400 mg by mouth every evening.    . Potassium 99 MG TABS Take 198 mg by mouth every evening.     . pregabalin (LYRICA) 150 MG capsule Take 300 mg by mouth at bedtime.    . pseudoephedrine (SUDAFED) 30 MG tablet Take 60 mg by mouth every 4 (four) hours as needed for congestion.    . QUEtiapine (SEROQUEL) 100 MG tablet Take 1 tablet (100 mg total) by mouth at bedtime.    . quinapril (ACCUPRIL) 40 MG tablet Take 80 mg by mouth daily.    . RESTASIS 0.05 % ophthalmic emulsion Place 1 drop into both eyes 2 (two) times daily as needed (dry/irritated eyes.).    Marland Kitchen sulfamethoxazole-trimethoprim (BACTRIM DS) 800-160 MG tablet Take 1 tablet by mouth 2 (two) times daily. 14 tablet 0  . tamsulosin (FLOMAX) 0.4 MG CAPS capsule Take 0.4 mg by mouth daily.    Marland Kitchen terbinafine (LAMISIL) 250 MG tablet Take 250 mg by mouth daily.    . traZODone (DESYREL) 100 MG tablet Take 1 tablet (100 mg total) by mouth at bedtime.    . Venlafaxine HCl 75 MG TB24 Take 225 mg by mouth daily.    . vitamin E 180 MG (400 UNITS) capsule Take 400 Units by mouth daily.     No current facility-administered medications for this visit.    Family History  Problem Relation Age of Onset  . Melanoma Mother   . Diabetes Father   . Heart attack Father   . Prostate cancer Father   . Arthritis Sister        rheumatoid  . Breast cancer Sister   . Breast cancer Maternal Grandmother   . Anesthesia problems Neg Hx   . Hypotension Neg Hx   . Malignant hyperthermia Neg Hx   . Pseudochol deficiency Neg Hx   . Pancreatic cancer Neg Hx   . Colon cancer Neg Hx     Social History   Socioeconomic History  . Marital status: Single    Spouse name: Not on file  . Number of children: Not on file  . Years of education: Not on file  . Highest education level: Not on file  Occupational History  . Occupation: Retired  Tobacco Use  . Smoking status: Former Smoker     Packs/day: 1.00    Years: 50.00    Pack years: 50.00    Types: Cigarettes    Quit date: 10/08/2013    Years since quitting: 6.3  . Smokeless tobacco: Never Used  . Tobacco comment: states smokes "little cigars" - former  Vaping Use  . Vaping Use: Never used  Substance and Sexual Activity  . Alcohol use: No    Alcohol/week: 0.0 standard drinks  . Drug use: No  . Sexual activity: Not Currently  Other Topics Concern  . Not on file  Social History Narrative  . Not on file   Social Determinants of Health   Financial Resource Strain: Not on file  Food Insecurity: Not on file  Transportation Needs: Not on file  Physical Activity: Not on file  Stress: Not on file  Social Connections: Not on file  Intimate Partner Violence: Not on file     REVIEW OF SYSTEMS:   [X]  denotes positive finding, [ ]  denotes negative finding Cardiac  Comments:  Chest pain or chest pressure:    Shortness  of breath upon exertion: x   Short of breath when lying flat:    Irregular heart rhythm:        Vascular    Pain in calf, thigh, or hip brought on by ambulation: x See HPI  Pain in feet at night that wakes you up from your sleep:  x See HPI  Blood clot in your veins:    Leg swelling:         Pulmonary    Oxygen at home:    Productive cough:  x   Wheezing:         Neurologic    Sudden weakness in arms or legs:     Sudden numbness in arms or legs:     Sudden onset of difficulty speaking or slurred speech:    Temporary loss of vision in one eye:     Problems with dizziness:         Gastrointestinal    Blood in stool:     Vomited blood:         Genitourinary    Burning when urinating:     Blood in urine:        Psychiatric    Major depression:         Hematologic    Bleeding problems:    Problems with blood clotting too easily:        Skin    Rashes or ulcers:        Constitutional    Fever or chills:      PHYSICAL EXAMINATION:  Today's Vitals   02/24/20 1328  BP: (!)  168/102  Pulse: 68  Resp: 20  Temp: 98.1 F (36.7 C)  SpO2: 95%  Weight: 276 lb (125.2 kg)  Height: 5\' 8"  (1.727 m)   Body mass index is 41.97 kg/m.   General:  WDWN in NAD; vital signs documented above Gait: Not observed HENT: WNL, normocephalic Pulmonary: normal non-labored breathing , without wheezing Cardiac: regular HR, without  Murmur; without carotid bruits Abdomen: soft, NT, no masses; aortic pulse is not palpable Skin: without rashes Vascular Exam/Pulses:  Right Left  Radial 2+ (normal) 2+ (normal)  Femoral Unable to palpate due to body habitus Unable to palpate due to body habitus  Popliteal Unable to palpate Unable to palpate  DP monophasic monophasic  PT monophasic monophasic  Peroneal monophasic Brisk biphasic    Extremities: without ischemic changes, without Gangrene , without cellulitis; without open wounds; +BLE with hemosiderin changes bilaterally with left>right Musculoskeletal: no muscle wasting or atrophy  Neurologic: A&O X 3;  No focal weakness or paresthesias are detected Psychiatric:  The pt has Normal affect.   Non-Invasive Vascular Imaging:   ABI's/TBI's on 02/03/2020: Right:  0.89/0.77 - Great toe pressure: 113 Left:  0.86/0.70 - Great toe pressure: 102  EVAR duplex 02/03/2020: Abdominal Aorta Findings:  +--------+-------+----------+----------+--------+--------+--------+  LocationAP (cm)Trans (cm)PSV (cm/s)WaveformThrombusComments  +--------+-------+----------+----------+--------+--------+--------+  Proximal3.66  3.22   68                  +--------+-------+----------+----------+--------+--------+--------+   Endovascular Aortic Repair (EVAR):  +----------+----------------+-------------------+-------------------+       Diameter AP (cm)Diameter Trans (cm)Velocities (cm/sec)  +----------+----------------+-------------------+-------------------+  Aorta   4.61      4.52        58            +----------+----------------+-------------------+-------------------+  Right Limb1.24      1.16        42           +----------+----------------+-------------------+-------------------+  Left Limb 1.54      1.71        41           +----------+----------------+-------------------+-------------------+   Previous ABI's/TBI's on 05/21/2019: Right:  1.03/0.98 - Great toe pressure: 142 Left:  1.10/1.08 - Great toe pressure:  156  Previous arterial duplex on 12/04/2018: Endovascular Aortic Repair (EVAR):  +----------+----------------+-------------------+-------------------+       Diameter AP (cm)Diameter Trans (cm)Velocities (cm/sec)  +----------+----------------+-------------------+-------------------+  Aorta   2.80      2.40        46           +----------+----------------+-------------------+-------------------+  Right Limb1.39      1.63        58           +----------+----------------+-------------------+-------------------+  Left Limb 1.54      1.65        67           +----------+----------------+-------------------+-------------------+   ASSESSMENT/PLAN:: 74 y.o. male here for follow up EVAR in September 2011 by Dr. Oneida Alar  AAA No evidence of endoleak or aneurysm sac enlargement as suggested by [place]. Continue surveillance annually with [place].   PAD -pt has c/o right foot and calf pain today at night.  He does not get these pains when he is sitting in his recliner or with walking.   His ABI is slightly decreased from back in March, but he still has good toe pressures.  I do not think this is related to blood flow.  He does not have any non healing wounds.    Carotid stenosis -pt had carotid duplex September 2020 that revealed 1-39% bilateral ICA stenosis.   -continue statin/asa  Leontine Locket, [facility] [phone]  Clinic MD:   Carlis Abbott

## 2020-02-25 DIAGNOSIS — R2689 Other abnormalities of gait and mobility: Secondary | ICD-10-CM | POA: Diagnosis not present

## 2020-02-25 DIAGNOSIS — M25561 Pain in right knee: Secondary | ICD-10-CM | POA: Diagnosis not present

## 2020-02-25 DIAGNOSIS — R296 Repeated falls: Secondary | ICD-10-CM | POA: Diagnosis not present

## 2020-03-02 DIAGNOSIS — M25561 Pain in right knee: Secondary | ICD-10-CM | POA: Diagnosis not present

## 2020-03-03 ENCOUNTER — Other Ambulatory Visit (HOSPITAL_COMMUNITY): Payer: Self-pay | Admitting: Family Medicine

## 2020-03-03 DIAGNOSIS — I700 Atherosclerosis of aorta: Secondary | ICD-10-CM

## 2020-03-03 DIAGNOSIS — I359 Nonrheumatic aortic valve disorder, unspecified: Secondary | ICD-10-CM

## 2020-03-09 DIAGNOSIS — Z79891 Long term (current) use of opiate analgesic: Secondary | ICD-10-CM | POA: Diagnosis not present

## 2020-03-09 DIAGNOSIS — M62838 Other muscle spasm: Secondary | ICD-10-CM | POA: Diagnosis not present

## 2020-03-09 DIAGNOSIS — M961 Postlaminectomy syndrome, not elsewhere classified: Secondary | ICD-10-CM | POA: Diagnosis not present

## 2020-03-09 DIAGNOSIS — G894 Chronic pain syndrome: Secondary | ICD-10-CM | POA: Diagnosis not present

## 2020-03-10 DIAGNOSIS — Z1389 Encounter for screening for other disorder: Secondary | ICD-10-CM | POA: Diagnosis not present

## 2020-03-10 DIAGNOSIS — Z0000 Encounter for general adult medical examination without abnormal findings: Secondary | ICD-10-CM | POA: Diagnosis not present

## 2020-03-10 DIAGNOSIS — Z23 Encounter for immunization: Secondary | ICD-10-CM | POA: Diagnosis not present

## 2020-03-11 DIAGNOSIS — G47419 Narcolepsy without cataplexy: Secondary | ICD-10-CM | POA: Insufficient documentation

## 2020-03-11 DIAGNOSIS — E7800 Pure hypercholesterolemia, unspecified: Secondary | ICD-10-CM | POA: Insufficient documentation

## 2020-03-11 DIAGNOSIS — G894 Chronic pain syndrome: Secondary | ICD-10-CM | POA: Insufficient documentation

## 2020-03-11 DIAGNOSIS — M4327 Fusion of spine, lumbosacral region: Secondary | ICD-10-CM | POA: Insufficient documentation

## 2020-03-11 DIAGNOSIS — Z860100 Personal history of colon polyps, unspecified: Secondary | ICD-10-CM | POA: Insufficient documentation

## 2020-03-11 DIAGNOSIS — Z87891 Personal history of nicotine dependence: Secondary | ICD-10-CM | POA: Insufficient documentation

## 2020-03-11 DIAGNOSIS — F319 Bipolar disorder, unspecified: Secondary | ICD-10-CM | POA: Insufficient documentation

## 2020-03-11 DIAGNOSIS — F17210 Nicotine dependence, cigarettes, uncomplicated: Secondary | ICD-10-CM | POA: Insufficient documentation

## 2020-03-11 DIAGNOSIS — M8580 Other specified disorders of bone density and structure, unspecified site: Secondary | ICD-10-CM | POA: Insufficient documentation

## 2020-03-11 DIAGNOSIS — Z9889 Other specified postprocedural states: Secondary | ICD-10-CM | POA: Insufficient documentation

## 2020-03-11 DIAGNOSIS — R296 Repeated falls: Secondary | ICD-10-CM | POA: Insufficient documentation

## 2020-03-11 DIAGNOSIS — N401 Enlarged prostate with lower urinary tract symptoms: Secondary | ICD-10-CM | POA: Insufficient documentation

## 2020-03-11 DIAGNOSIS — R413 Other amnesia: Secondary | ICD-10-CM | POA: Insufficient documentation

## 2020-03-11 DIAGNOSIS — F39 Unspecified mood [affective] disorder: Secondary | ICD-10-CM | POA: Insufficient documentation

## 2020-03-11 DIAGNOSIS — R2689 Other abnormalities of gait and mobility: Secondary | ICD-10-CM | POA: Insufficient documentation

## 2020-03-11 DIAGNOSIS — K5901 Slow transit constipation: Secondary | ICD-10-CM | POA: Insufficient documentation

## 2020-03-11 DIAGNOSIS — I700 Atherosclerosis of aorta: Secondary | ICD-10-CM | POA: Insufficient documentation

## 2020-03-11 DIAGNOSIS — M5136 Other intervertebral disc degeneration, lumbar region: Secondary | ICD-10-CM | POA: Insufficient documentation

## 2020-03-11 DIAGNOSIS — I359 Nonrheumatic aortic valve disorder, unspecified: Secondary | ICD-10-CM | POA: Insufficient documentation

## 2020-03-11 DIAGNOSIS — Z8679 Personal history of other diseases of the circulatory system: Secondary | ICD-10-CM | POA: Insufficient documentation

## 2020-03-11 DIAGNOSIS — R972 Elevated prostate specific antigen [PSA]: Secondary | ICD-10-CM | POA: Insufficient documentation

## 2020-03-12 DIAGNOSIS — H16142 Punctate keratitis, left eye: Secondary | ICD-10-CM | POA: Diagnosis not present

## 2020-03-15 ENCOUNTER — Ambulatory Visit: Payer: PPO | Admitting: Podiatry

## 2020-03-16 ENCOUNTER — Other Ambulatory Visit: Payer: Self-pay

## 2020-03-16 ENCOUNTER — Ambulatory Visit (HOSPITAL_COMMUNITY)
Admission: RE | Admit: 2020-03-16 | Discharge: 2020-03-16 | Disposition: A | Discharge status: Home / Self Care | Payer: PPO | Source: Ambulatory Visit | Attending: Family Medicine | Admitting: Family Medicine

## 2020-03-16 DIAGNOSIS — I700 Atherosclerosis of aorta: Secondary | ICD-10-CM | POA: Insufficient documentation

## 2020-03-16 DIAGNOSIS — I10 Essential (primary) hypertension: Secondary | ICD-10-CM | POA: Insufficient documentation

## 2020-03-16 DIAGNOSIS — I350 Nonrheumatic aortic (valve) stenosis: Secondary | ICD-10-CM | POA: Diagnosis not present

## 2020-03-16 DIAGNOSIS — I359 Nonrheumatic aortic valve disorder, unspecified: Secondary | ICD-10-CM

## 2020-03-16 DIAGNOSIS — E785 Hyperlipidemia, unspecified: Secondary | ICD-10-CM | POA: Insufficient documentation

## 2020-03-16 LAB — ECHOCARDIOGRAM COMPLETE
AR max vel: 2.16 cm2
AV Area VTI: 2.5 cm2
AV Area mean vel: 2.45 cm2
AV Mean grad: 17 mmHg
AV Peak grad: 32.5 mmHg
Ao pk vel: 2.85 m/s
Area-P 1/2: 1.28 cm2
S' Lateral: 3 cm

## 2020-03-17 DIAGNOSIS — M6281 Muscle weakness (generalized): Secondary | ICD-10-CM | POA: Diagnosis not present

## 2020-03-17 DIAGNOSIS — M2560 Stiffness of unspecified joint, not elsewhere classified: Secondary | ICD-10-CM | POA: Diagnosis not present

## 2020-03-17 DIAGNOSIS — R262 Difficulty in walking, not elsewhere classified: Secondary | ICD-10-CM | POA: Diagnosis not present

## 2020-03-17 DIAGNOSIS — R296 Repeated falls: Secondary | ICD-10-CM | POA: Diagnosis not present

## 2020-03-19 ENCOUNTER — Ambulatory Visit (INDEPENDENT_AMBULATORY_CARE_PROVIDER_SITE_OTHER): Payer: PPO | Admitting: Podiatry

## 2020-03-19 ENCOUNTER — Ambulatory Visit (INDEPENDENT_AMBULATORY_CARE_PROVIDER_SITE_OTHER): Payer: PPO

## 2020-03-19 ENCOUNTER — Other Ambulatory Visit: Payer: Self-pay

## 2020-03-19 DIAGNOSIS — M722 Plantar fascial fibromatosis: Secondary | ICD-10-CM | POA: Diagnosis not present

## 2020-03-19 DIAGNOSIS — M2041 Other hammer toe(s) (acquired), right foot: Secondary | ICD-10-CM

## 2020-03-19 DIAGNOSIS — M79671 Pain in right foot: Secondary | ICD-10-CM | POA: Diagnosis not present

## 2020-03-19 DIAGNOSIS — M79672 Pain in left foot: Secondary | ICD-10-CM | POA: Diagnosis not present

## 2020-03-19 DIAGNOSIS — B351 Tinea unguium: Secondary | ICD-10-CM | POA: Diagnosis not present

## 2020-03-19 DIAGNOSIS — M2042 Other hammer toe(s) (acquired), left foot: Secondary | ICD-10-CM | POA: Diagnosis not present

## 2020-03-22 ENCOUNTER — Encounter: Payer: Self-pay | Admitting: Podiatry

## 2020-03-22 DIAGNOSIS — M6281 Muscle weakness (generalized): Secondary | ICD-10-CM | POA: Diagnosis not present

## 2020-03-22 DIAGNOSIS — R296 Repeated falls: Secondary | ICD-10-CM | POA: Diagnosis not present

## 2020-03-22 DIAGNOSIS — M2560 Stiffness of unspecified joint, not elsewhere classified: Secondary | ICD-10-CM | POA: Diagnosis not present

## 2020-03-22 DIAGNOSIS — R262 Difficulty in walking, not elsewhere classified: Secondary | ICD-10-CM | POA: Diagnosis not present

## 2020-03-22 NOTE — Progress Notes (Signed)
  Subjective:  Patient ID: Michael Lutz, male    DOB: [birth year],  MRN: [id number]  Chief Complaint  Patient presents with  . Nail Problem    Toe nail fungus (bilateral)   . Foot Pain    Right foot pain.     75 y.o. male presents with the above complaint. History confirmed with patient.   Objective:  Physical Exam: warm, good capillary refill, no trophic changes or ulcerative lesions, normal DP and PT pulses and normal sensory exam. He has hammertoe contractures bilaterally. Pain on 5th toe PIPJ. Onychomycosis present bilaterally on the hallux. The left is worst and is painful and thick. He also has a mass in the plantar arch it is only intermittently painful  Radiographs: X-ray of both feet: hammertoe contractures noted with adductovarus rotation of the toes Assessment:   1. Foot pain, bilateral   2. Onychomycosis      Plan:  Patient was evaluated and treated and all questions answered.  Discussed the etiology and treatment options for the condition in detail with the patient. Educated patient on the topical and oral treatment options for mycotic nails. Recommended debridement of the nails today. Sharp and mechanical debridement performed of all painful and mycotic nails today. Nails debrided in length and thickness using a nail nipper and a mechanical burr to level of comfort. Discussed treatment options including appropriate shoe gear. Follow up as needed for painful nails.  Discussed surgical and non surgical treatment options for both hammertoes and the plantar fibroma. I do not think he would be able to maintain limited or non weightbearing on the foot to have excision on the mass. It is only intermittently painful. I recommend injection therapy with the mass which we will do at the time of surgery. He also has tried wider shoes and silicone pads which were not helpful for the painful toe. Additionally he would like the left great toenail removed permanently. We discussed all  the risks benefits and potential complications of the above. Informed consent was reviewed. Surgery will be scheduled at a mutually agreeable date.    Surgical plan:  Procedure: -right 5th toe PIPJ arthroplasty, left hallux permanent nail removal, injection of plantar mass with corticosteroid  Location: -[facility]  Anesthesia plan: -IV sedation w/ local anesthesia  Postoperative pain plan: - Tylenol 1000 mg every 6 hours, ibuprofen 600 mg every 6 hours, gabapentin 300 mg every 8 hours x5 days, oxycodone 5 mg 1-2 tabs every 6 hours only as needed  DVT prophylaxis: -none required  WB Restrictions / DME needs: -WBAT in surgical shoe this was dispensed today    No follow-ups on file.

## 2020-03-23 DIAGNOSIS — M79604 Pain in right leg: Secondary | ICD-10-CM | POA: Diagnosis not present

## 2020-03-24 DIAGNOSIS — R296 Repeated falls: Secondary | ICD-10-CM | POA: Diagnosis not present

## 2020-03-24 DIAGNOSIS — M6281 Muscle weakness (generalized): Secondary | ICD-10-CM | POA: Diagnosis not present

## 2020-03-24 DIAGNOSIS — M2560 Stiffness of unspecified joint, not elsewhere classified: Secondary | ICD-10-CM | POA: Diagnosis not present

## 2020-03-24 DIAGNOSIS — R262 Difficulty in walking, not elsewhere classified: Secondary | ICD-10-CM | POA: Diagnosis not present

## 2020-03-31 DIAGNOSIS — M2560 Stiffness of unspecified joint, not elsewhere classified: Secondary | ICD-10-CM | POA: Diagnosis not present

## 2020-03-31 DIAGNOSIS — M6281 Muscle weakness (generalized): Secondary | ICD-10-CM | POA: Diagnosis not present

## 2020-03-31 DIAGNOSIS — R296 Repeated falls: Secondary | ICD-10-CM | POA: Diagnosis not present

## 2020-03-31 DIAGNOSIS — R262 Difficulty in walking, not elsewhere classified: Secondary | ICD-10-CM | POA: Diagnosis not present

## 2020-04-02 DIAGNOSIS — M2560 Stiffness of unspecified joint, not elsewhere classified: Secondary | ICD-10-CM | POA: Diagnosis not present

## 2020-04-02 DIAGNOSIS — R296 Repeated falls: Secondary | ICD-10-CM | POA: Diagnosis not present

## 2020-04-02 DIAGNOSIS — M6281 Muscle weakness (generalized): Secondary | ICD-10-CM | POA: Diagnosis not present

## 2020-04-02 DIAGNOSIS — R262 Difficulty in walking, not elsewhere classified: Secondary | ICD-10-CM | POA: Diagnosis not present

## 2020-04-05 DIAGNOSIS — R262 Difficulty in walking, not elsewhere classified: Secondary | ICD-10-CM | POA: Diagnosis not present

## 2020-04-05 DIAGNOSIS — R296 Repeated falls: Secondary | ICD-10-CM | POA: Diagnosis not present

## 2020-04-05 DIAGNOSIS — M6281 Muscle weakness (generalized): Secondary | ICD-10-CM | POA: Diagnosis not present

## 2020-04-05 DIAGNOSIS — M2560 Stiffness of unspecified joint, not elsewhere classified: Secondary | ICD-10-CM | POA: Diagnosis not present

## 2020-04-06 DIAGNOSIS — M8580 Other specified disorders of bone density and structure, unspecified site: Secondary | ICD-10-CM | POA: Diagnosis not present

## 2020-04-06 DIAGNOSIS — C61 Malignant neoplasm of prostate: Secondary | ICD-10-CM | POA: Diagnosis not present

## 2020-04-06 DIAGNOSIS — I2510 Atherosclerotic heart disease of native coronary artery without angina pectoris: Secondary | ICD-10-CM | POA: Diagnosis not present

## 2020-04-06 DIAGNOSIS — F319 Bipolar disorder, unspecified: Secondary | ICD-10-CM | POA: Diagnosis not present

## 2020-04-06 DIAGNOSIS — J439 Emphysema, unspecified: Secondary | ICD-10-CM | POA: Diagnosis not present

## 2020-04-06 DIAGNOSIS — E7800 Pure hypercholesterolemia, unspecified: Secondary | ICD-10-CM | POA: Diagnosis not present

## 2020-04-06 DIAGNOSIS — I10 Essential (primary) hypertension: Secondary | ICD-10-CM | POA: Diagnosis not present

## 2020-04-06 DIAGNOSIS — N401 Enlarged prostate with lower urinary tract symptoms: Secondary | ICD-10-CM | POA: Diagnosis not present

## 2020-04-07 DIAGNOSIS — R296 Repeated falls: Secondary | ICD-10-CM | POA: Diagnosis not present

## 2020-04-07 DIAGNOSIS — R262 Difficulty in walking, not elsewhere classified: Secondary | ICD-10-CM | POA: Diagnosis not present

## 2020-04-07 DIAGNOSIS — M6281 Muscle weakness (generalized): Secondary | ICD-10-CM | POA: Diagnosis not present

## 2020-04-07 DIAGNOSIS — M2560 Stiffness of unspecified joint, not elsewhere classified: Secondary | ICD-10-CM | POA: Diagnosis not present

## 2020-04-08 DIAGNOSIS — M6281 Muscle weakness (generalized): Secondary | ICD-10-CM | POA: Diagnosis not present

## 2020-04-08 DIAGNOSIS — M2560 Stiffness of unspecified joint, not elsewhere classified: Secondary | ICD-10-CM | POA: Diagnosis not present

## 2020-04-08 DIAGNOSIS — R296 Repeated falls: Secondary | ICD-10-CM | POA: Diagnosis not present

## 2020-04-08 DIAGNOSIS — R262 Difficulty in walking, not elsewhere classified: Secondary | ICD-10-CM | POA: Diagnosis not present

## 2020-04-09 DIAGNOSIS — N3941 Urge incontinence: Secondary | ICD-10-CM | POA: Diagnosis not present

## 2020-04-09 DIAGNOSIS — C61 Malignant neoplasm of prostate: Secondary | ICD-10-CM | POA: Diagnosis not present

## 2020-04-09 DIAGNOSIS — R350 Frequency of micturition: Secondary | ICD-10-CM | POA: Diagnosis not present

## 2020-04-09 DIAGNOSIS — N401 Enlarged prostate with lower urinary tract symptoms: Secondary | ICD-10-CM | POA: Diagnosis not present

## 2020-04-12 DIAGNOSIS — F3111 Bipolar disorder, current episode manic without psychotic features, mild: Secondary | ICD-10-CM | POA: Diagnosis not present

## 2020-04-12 DIAGNOSIS — M6281 Muscle weakness (generalized): Secondary | ICD-10-CM | POA: Diagnosis not present

## 2020-04-12 DIAGNOSIS — R262 Difficulty in walking, not elsewhere classified: Secondary | ICD-10-CM | POA: Diagnosis not present

## 2020-04-12 DIAGNOSIS — M2560 Stiffness of unspecified joint, not elsewhere classified: Secondary | ICD-10-CM | POA: Diagnosis not present

## 2020-04-12 DIAGNOSIS — R296 Repeated falls: Secondary | ICD-10-CM | POA: Diagnosis not present

## 2020-04-14 DIAGNOSIS — R262 Difficulty in walking, not elsewhere classified: Secondary | ICD-10-CM | POA: Diagnosis not present

## 2020-04-14 DIAGNOSIS — M2560 Stiffness of unspecified joint, not elsewhere classified: Secondary | ICD-10-CM | POA: Diagnosis not present

## 2020-04-14 DIAGNOSIS — R296 Repeated falls: Secondary | ICD-10-CM | POA: Diagnosis not present

## 2020-04-14 DIAGNOSIS — M6281 Muscle weakness (generalized): Secondary | ICD-10-CM | POA: Diagnosis not present

## 2020-04-16 DIAGNOSIS — M2560 Stiffness of unspecified joint, not elsewhere classified: Secondary | ICD-10-CM | POA: Diagnosis not present

## 2020-04-16 DIAGNOSIS — M6281 Muscle weakness (generalized): Secondary | ICD-10-CM | POA: Diagnosis not present

## 2020-04-16 DIAGNOSIS — R262 Difficulty in walking, not elsewhere classified: Secondary | ICD-10-CM | POA: Diagnosis not present

## 2020-04-16 DIAGNOSIS — R296 Repeated falls: Secondary | ICD-10-CM | POA: Diagnosis not present

## 2020-04-19 DIAGNOSIS — N3941 Urge incontinence: Secondary | ICD-10-CM | POA: Diagnosis not present

## 2020-04-19 DIAGNOSIS — C61 Malignant neoplasm of prostate: Secondary | ICD-10-CM | POA: Diagnosis not present

## 2020-04-19 DIAGNOSIS — N401 Enlarged prostate with lower urinary tract symptoms: Secondary | ICD-10-CM | POA: Diagnosis not present

## 2020-04-20 DIAGNOSIS — M1712 Unilateral primary osteoarthritis, left knee: Secondary | ICD-10-CM | POA: Diagnosis not present

## 2020-04-21 DIAGNOSIS — M2560 Stiffness of unspecified joint, not elsewhere classified: Secondary | ICD-10-CM | POA: Diagnosis not present

## 2020-04-21 DIAGNOSIS — R296 Repeated falls: Secondary | ICD-10-CM | POA: Diagnosis not present

## 2020-04-21 DIAGNOSIS — M6281 Muscle weakness (generalized): Secondary | ICD-10-CM | POA: Diagnosis not present

## 2020-04-21 DIAGNOSIS — R262 Difficulty in walking, not elsewhere classified: Secondary | ICD-10-CM | POA: Diagnosis not present

## 2020-04-23 DIAGNOSIS — R296 Repeated falls: Secondary | ICD-10-CM | POA: Diagnosis not present

## 2020-04-23 DIAGNOSIS — M2560 Stiffness of unspecified joint, not elsewhere classified: Secondary | ICD-10-CM | POA: Diagnosis not present

## 2020-04-23 DIAGNOSIS — R262 Difficulty in walking, not elsewhere classified: Secondary | ICD-10-CM | POA: Diagnosis not present

## 2020-04-23 DIAGNOSIS — M6281 Muscle weakness (generalized): Secondary | ICD-10-CM | POA: Diagnosis not present

## 2020-04-26 DIAGNOSIS — M6281 Muscle weakness (generalized): Secondary | ICD-10-CM | POA: Diagnosis not present

## 2020-04-26 DIAGNOSIS — M2560 Stiffness of unspecified joint, not elsewhere classified: Secondary | ICD-10-CM | POA: Diagnosis not present

## 2020-04-26 DIAGNOSIS — R262 Difficulty in walking, not elsewhere classified: Secondary | ICD-10-CM | POA: Diagnosis not present

## 2020-04-26 DIAGNOSIS — R296 Repeated falls: Secondary | ICD-10-CM | POA: Diagnosis not present

## 2020-04-29 DIAGNOSIS — M2560 Stiffness of unspecified joint, not elsewhere classified: Secondary | ICD-10-CM | POA: Diagnosis not present

## 2020-04-29 DIAGNOSIS — R296 Repeated falls: Secondary | ICD-10-CM | POA: Diagnosis not present

## 2020-04-29 DIAGNOSIS — R262 Difficulty in walking, not elsewhere classified: Secondary | ICD-10-CM | POA: Diagnosis not present

## 2020-04-29 DIAGNOSIS — M6281 Muscle weakness (generalized): Secondary | ICD-10-CM | POA: Diagnosis not present

## 2020-04-30 DIAGNOSIS — R262 Difficulty in walking, not elsewhere classified: Secondary | ICD-10-CM | POA: Diagnosis not present

## 2020-04-30 DIAGNOSIS — M6281 Muscle weakness (generalized): Secondary | ICD-10-CM | POA: Diagnosis not present

## 2020-04-30 DIAGNOSIS — R296 Repeated falls: Secondary | ICD-10-CM | POA: Diagnosis not present

## 2020-04-30 DIAGNOSIS — M2560 Stiffness of unspecified joint, not elsewhere classified: Secondary | ICD-10-CM | POA: Diagnosis not present

## 2020-05-03 DIAGNOSIS — Z1211 Encounter for screening for malignant neoplasm of colon: Secondary | ICD-10-CM | POA: Diagnosis not present

## 2020-05-03 DIAGNOSIS — R262 Difficulty in walking, not elsewhere classified: Secondary | ICD-10-CM | POA: Diagnosis not present

## 2020-05-03 DIAGNOSIS — M2560 Stiffness of unspecified joint, not elsewhere classified: Secondary | ICD-10-CM | POA: Diagnosis not present

## 2020-05-03 DIAGNOSIS — R296 Repeated falls: Secondary | ICD-10-CM | POA: Diagnosis not present

## 2020-05-03 DIAGNOSIS — M6281 Muscle weakness (generalized): Secondary | ICD-10-CM | POA: Diagnosis not present

## 2020-05-04 DIAGNOSIS — E7800 Pure hypercholesterolemia, unspecified: Secondary | ICD-10-CM | POA: Diagnosis not present

## 2020-05-04 DIAGNOSIS — N401 Enlarged prostate with lower urinary tract symptoms: Secondary | ICD-10-CM | POA: Diagnosis not present

## 2020-05-04 DIAGNOSIS — J439 Emphysema, unspecified: Secondary | ICD-10-CM | POA: Diagnosis not present

## 2020-05-04 DIAGNOSIS — F319 Bipolar disorder, unspecified: Secondary | ICD-10-CM | POA: Diagnosis not present

## 2020-05-04 DIAGNOSIS — M1712 Unilateral primary osteoarthritis, left knee: Secondary | ICD-10-CM | POA: Diagnosis not present

## 2020-05-04 DIAGNOSIS — C61 Malignant neoplasm of prostate: Secondary | ICD-10-CM | POA: Diagnosis not present

## 2020-05-04 DIAGNOSIS — M8580 Other specified disorders of bone density and structure, unspecified site: Secondary | ICD-10-CM | POA: Diagnosis not present

## 2020-05-04 DIAGNOSIS — I2510 Atherosclerotic heart disease of native coronary artery without angina pectoris: Secondary | ICD-10-CM | POA: Diagnosis not present

## 2020-05-04 DIAGNOSIS — I10 Essential (primary) hypertension: Secondary | ICD-10-CM | POA: Diagnosis not present

## 2020-05-05 DIAGNOSIS — Z79891 Long term (current) use of opiate analgesic: Secondary | ICD-10-CM | POA: Diagnosis not present

## 2020-05-05 DIAGNOSIS — G894 Chronic pain syndrome: Secondary | ICD-10-CM | POA: Diagnosis not present

## 2020-05-05 DIAGNOSIS — M62838 Other muscle spasm: Secondary | ICD-10-CM | POA: Diagnosis not present

## 2020-05-05 DIAGNOSIS — M961 Postlaminectomy syndrome, not elsewhere classified: Secondary | ICD-10-CM | POA: Diagnosis not present

## 2020-05-12 DIAGNOSIS — R296 Repeated falls: Secondary | ICD-10-CM | POA: Diagnosis not present

## 2020-05-12 DIAGNOSIS — R262 Difficulty in walking, not elsewhere classified: Secondary | ICD-10-CM | POA: Diagnosis not present

## 2020-05-12 DIAGNOSIS — M6281 Muscle weakness (generalized): Secondary | ICD-10-CM | POA: Diagnosis not present

## 2020-05-12 DIAGNOSIS — M2560 Stiffness of unspecified joint, not elsewhere classified: Secondary | ICD-10-CM | POA: Diagnosis not present

## 2020-05-14 DIAGNOSIS — M2560 Stiffness of unspecified joint, not elsewhere classified: Secondary | ICD-10-CM | POA: Diagnosis not present

## 2020-05-14 DIAGNOSIS — M6281 Muscle weakness (generalized): Secondary | ICD-10-CM | POA: Diagnosis not present

## 2020-05-14 DIAGNOSIS — R262 Difficulty in walking, not elsewhere classified: Secondary | ICD-10-CM | POA: Diagnosis not present

## 2020-05-14 DIAGNOSIS — R296 Repeated falls: Secondary | ICD-10-CM | POA: Diagnosis not present

## 2020-05-17 DIAGNOSIS — R262 Difficulty in walking, not elsewhere classified: Secondary | ICD-10-CM | POA: Diagnosis not present

## 2020-05-17 DIAGNOSIS — R296 Repeated falls: Secondary | ICD-10-CM | POA: Diagnosis not present

## 2020-05-17 DIAGNOSIS — M6281 Muscle weakness (generalized): Secondary | ICD-10-CM | POA: Diagnosis not present

## 2020-05-17 DIAGNOSIS — M2560 Stiffness of unspecified joint, not elsewhere classified: Secondary | ICD-10-CM | POA: Diagnosis not present

## 2020-05-19 DIAGNOSIS — M2560 Stiffness of unspecified joint, not elsewhere classified: Secondary | ICD-10-CM | POA: Diagnosis not present

## 2020-05-19 DIAGNOSIS — M6281 Muscle weakness (generalized): Secondary | ICD-10-CM | POA: Diagnosis not present

## 2020-05-19 DIAGNOSIS — R296 Repeated falls: Secondary | ICD-10-CM | POA: Diagnosis not present

## 2020-05-19 DIAGNOSIS — R262 Difficulty in walking, not elsewhere classified: Secondary | ICD-10-CM | POA: Diagnosis not present

## 2020-05-20 ENCOUNTER — Ambulatory Visit (INDEPENDENT_AMBULATORY_CARE_PROVIDER_SITE_OTHER): Payer: PPO | Admitting: Podiatry

## 2020-05-20 ENCOUNTER — Other Ambulatory Visit: Payer: Self-pay

## 2020-05-20 ENCOUNTER — Encounter: Payer: PPO | Admitting: Podiatry

## 2020-05-20 ENCOUNTER — Telehealth: Payer: Self-pay | Admitting: Urology

## 2020-05-20 DIAGNOSIS — M79674 Pain in right toe(s): Secondary | ICD-10-CM | POA: Diagnosis not present

## 2020-05-20 DIAGNOSIS — B351 Tinea unguium: Secondary | ICD-10-CM | POA: Diagnosis not present

## 2020-05-20 DIAGNOSIS — L608 Other nail disorders: Secondary | ICD-10-CM | POA: Diagnosis not present

## 2020-05-20 DIAGNOSIS — M722 Plantar fascial fibromatosis: Secondary | ICD-10-CM | POA: Diagnosis not present

## 2020-05-20 DIAGNOSIS — M2041 Other hammer toe(s) (acquired), right foot: Secondary | ICD-10-CM

## 2020-05-20 DIAGNOSIS — L603 Nail dystrophy: Secondary | ICD-10-CM | POA: Diagnosis not present

## 2020-05-20 DIAGNOSIS — M2042 Other hammer toe(s) (acquired), left foot: Secondary | ICD-10-CM | POA: Diagnosis not present

## 2020-05-20 DIAGNOSIS — M79675 Pain in left toe(s): Secondary | ICD-10-CM | POA: Diagnosis not present

## 2020-05-20 NOTE — Telephone Encounter (Addendum)
DOS: 05/28/20  EXC NAIL PERM 1ST AND 4TH LEFT, 1ST RIGHT-- 11750 X'S 3 HAMMER TOE REPAIR 5TH RIGHT-- [phone] INJECTION-- 20550   SPOKE WITH [facility] WITH HEALTH TEAM ADVANTAGE CPT CODES 98614, [id number] AND 54301 HAVE BEEN APPROVED AND GOOD FROM 05/17/20-08/15/20. REF # [id number]  SPOKE WITH [facility]. WITH HEALTH TEAM ADVANTAGE CPT CODE 97953 X'2 DOESN'T NEED PRIOR [place].  REF # [id number]

## 2020-05-21 ENCOUNTER — Encounter: Payer: Self-pay | Admitting: Podiatry

## 2020-05-21 NOTE — Progress Notes (Signed)
  Subjective:  Patient ID: Michael Lutz, male    DOB: [birth year],  MRN: [id number]  Chief Complaint  Patient presents with  . Nail Problem    Would like nails trimmed concerned about the right hallux nail getting caught on his sock    75 y.o. male returns with the above complaint. History confirmed with patient.  He is scheduled for surgery next Friday for arthroplasty of the right fifth toe, permanent matricectomy of the left hallux nail, and injection of his fibroma.  He would also like to have the right hallux nail that grew back removed permanently as well as the left fourth toe.  His remaining toes become thickened elongated and painful and he is unable to cut them.  Objective:  Physical Exam: warm, good capillary refill, no trophic changes or ulcerative lesions, normal DP and PT pulses and normal sensory exam. He has hammertoe contractures bilaterally. Pain on 5th toe PIPJ. Onychomycosis present bilaterally as well as nail dystrophy with pincer nail deformity. The left is worst and is painful and thick. He also has a mass in the plantar arch it is only intermittently painful  Radiographs: X-ray of both feet: hammertoe contractures noted with adductovarus rotation of the toes Assessment:   1. Hammertoes of both feet   2. Onychomycosis   3. Onychodystrophy   4. Plantar fibromatosis   5. Pain due to onychomycosis of toenails of both feet   6. Pincer nail deformity      Plan:  Patient was evaluated and treated and all questions answered.  Discussed the etiology and treatment options for the condition in detail with the patient. Educated patient on the topical and oral treatment options for mycotic nails. Recommended debridement of the nails today. Sharp and mechanical debridement performed of all painful and mycotic nails today. Nails debrided in length and thickness using a nail nipper and a mechanical burr to level of comfort. Discussed treatment options including appropriate shoe  gear. Follow up as needed for painful nails.   Discussed permanent removal of the right hallux and left fourth toenail as well.  These will be added to his consent.  We will plan to perform chemical matricectomy's on the toenails that we are removing.  No follow-ups on file.

## 2020-05-24 DIAGNOSIS — R296 Repeated falls: Secondary | ICD-10-CM | POA: Diagnosis not present

## 2020-05-24 DIAGNOSIS — M6281 Muscle weakness (generalized): Secondary | ICD-10-CM | POA: Diagnosis not present

## 2020-05-24 DIAGNOSIS — R262 Difficulty in walking, not elsewhere classified: Secondary | ICD-10-CM | POA: Diagnosis not present

## 2020-05-24 DIAGNOSIS — M2560 Stiffness of unspecified joint, not elsewhere classified: Secondary | ICD-10-CM | POA: Diagnosis not present

## 2020-05-25 DIAGNOSIS — M1711 Unilateral primary osteoarthritis, right knee: Secondary | ICD-10-CM | POA: Diagnosis not present

## 2020-05-25 DIAGNOSIS — M170 Bilateral primary osteoarthritis of knee: Secondary | ICD-10-CM | POA: Diagnosis not present

## 2020-05-26 DIAGNOSIS — M2560 Stiffness of unspecified joint, not elsewhere classified: Secondary | ICD-10-CM | POA: Diagnosis not present

## 2020-05-26 DIAGNOSIS — M6281 Muscle weakness (generalized): Secondary | ICD-10-CM | POA: Diagnosis not present

## 2020-05-26 DIAGNOSIS — R296 Repeated falls: Secondary | ICD-10-CM | POA: Diagnosis not present

## 2020-05-26 DIAGNOSIS — R262 Difficulty in walking, not elsewhere classified: Secondary | ICD-10-CM | POA: Diagnosis not present

## 2020-05-27 ENCOUNTER — Encounter: Payer: PPO | Admitting: Podiatry

## 2020-05-28 DIAGNOSIS — M722 Plantar fascial fibromatosis: Secondary | ICD-10-CM | POA: Diagnosis not present

## 2020-05-28 DIAGNOSIS — M2041 Other hammer toe(s) (acquired), right foot: Secondary | ICD-10-CM | POA: Diagnosis not present

## 2020-05-28 DIAGNOSIS — D492 Neoplasm of unspecified behavior of bone, soft tissue, and skin: Secondary | ICD-10-CM | POA: Diagnosis not present

## 2020-05-28 DIAGNOSIS — B351 Tinea unguium: Secondary | ICD-10-CM | POA: Diagnosis not present

## 2020-05-28 DIAGNOSIS — L600 Ingrowing nail: Secondary | ICD-10-CM | POA: Diagnosis not present

## 2020-05-28 DIAGNOSIS — L601 Onycholysis: Secondary | ICD-10-CM | POA: Diagnosis not present

## 2020-06-03 ENCOUNTER — Ambulatory Visit (INDEPENDENT_AMBULATORY_CARE_PROVIDER_SITE_OTHER): Payer: PPO

## 2020-06-03 ENCOUNTER — Other Ambulatory Visit: Payer: Self-pay

## 2020-06-03 ENCOUNTER — Ambulatory Visit (INDEPENDENT_AMBULATORY_CARE_PROVIDER_SITE_OTHER): Payer: PPO | Admitting: Podiatry

## 2020-06-03 DIAGNOSIS — L600 Ingrowing nail: Secondary | ICD-10-CM

## 2020-06-03 DIAGNOSIS — Z9889 Other specified postprocedural states: Secondary | ICD-10-CM

## 2020-06-03 DIAGNOSIS — M2041 Other hammer toe(s) (acquired), right foot: Secondary | ICD-10-CM

## 2020-06-03 MED ORDER — CEPHALEXIN 500 MG PO CAPS: 500.0000 mg | ORAL_CAPSULE | Freq: Three times a day (TID) | ORAL | 0 refills | Status: DC

## 2020-06-07 ENCOUNTER — Encounter: Payer: Self-pay | Admitting: Podiatry

## 2020-06-07 NOTE — Progress Notes (Signed)
  Subjective:  Patient ID: Michael Lutz, male    DOB: [birth year],  MRN: [id number]  Chief Complaint  Patient presents with  . Routine Post Op    PT stated that he has some pain    DOS: 05/28/2020 Procedure: Left fourth, bilateral hallux permanent total nail avulsion, injection of plantar fibroma, arthroplasty right fifth toe  75 y.o. male returns for post-op check.  Doing well minimal pain  Review of Systems: Negative except as noted in the HPI. Denies N/V/F/Ch.   Objective:  There were no vitals filed for this visit. There is no height or weight on file to calculate BMI. Constitutional Well developed. Well nourished.  Vascular Foot warm and well perfused. Capillary refill normal to all digits.   Neurologic Normal speech. Oriented to person, place, and time. Epicritic sensation to light touch grossly present bilaterally.  Dermatologic  matricectomy sites are healing well.  Sutures are intact and well coapted.  There is some erythema about the fifth toe  Orthopedic: Tenderness to palpation noted about the surgical site.   Radiographs: Status post arthroplasty of the right fifth toe Assessment:   1. Status post surgery   2. Hammertoe of right foot   3. Ingrowing left great toenail   4. Ingrowing right great toenail    Plan:  Patient was evaluated and treated and all questions answered.  S/p foot surgery right -Progressing as expected post-operatively. -XR: As above -WB Status: WBAT in surgical shoe on right, regular shoes with left -Sutures: We will leave intact for 2 weeks. -Medications: Rx for Keflex for erythema around the incision -Foot redressed.  Return in about 2 weeks (around 06/17/2020) for suture removal .

## 2020-06-08 DIAGNOSIS — M1711 Unilateral primary osteoarthritis, right knee: Secondary | ICD-10-CM | POA: Diagnosis not present

## 2020-06-08 DIAGNOSIS — M1712 Unilateral primary osteoarthritis, left knee: Secondary | ICD-10-CM | POA: Diagnosis not present

## 2020-06-10 ENCOUNTER — Encounter: Payer: PPO | Admitting: Podiatry

## 2020-06-15 DIAGNOSIS — I10 Essential (primary) hypertension: Secondary | ICD-10-CM | POA: Diagnosis not present

## 2020-06-15 DIAGNOSIS — M1711 Unilateral primary osteoarthritis, right knee: Secondary | ICD-10-CM | POA: Diagnosis not present

## 2020-06-15 DIAGNOSIS — N401 Enlarged prostate with lower urinary tract symptoms: Secondary | ICD-10-CM | POA: Diagnosis not present

## 2020-06-15 DIAGNOSIS — I2510 Atherosclerotic heart disease of native coronary artery without angina pectoris: Secondary | ICD-10-CM | POA: Diagnosis not present

## 2020-06-15 DIAGNOSIS — J439 Emphysema, unspecified: Secondary | ICD-10-CM | POA: Diagnosis not present

## 2020-06-15 DIAGNOSIS — M170 Bilateral primary osteoarthritis of knee: Secondary | ICD-10-CM | POA: Diagnosis not present

## 2020-06-15 DIAGNOSIS — C61 Malignant neoplasm of prostate: Secondary | ICD-10-CM | POA: Diagnosis not present

## 2020-06-15 DIAGNOSIS — E7800 Pure hypercholesterolemia, unspecified: Secondary | ICD-10-CM | POA: Diagnosis not present

## 2020-06-15 DIAGNOSIS — M8580 Other specified disorders of bone density and structure, unspecified site: Secondary | ICD-10-CM | POA: Diagnosis not present

## 2020-06-15 DIAGNOSIS — F319 Bipolar disorder, unspecified: Secondary | ICD-10-CM | POA: Diagnosis not present

## 2020-06-17 ENCOUNTER — Ambulatory Visit (INDEPENDENT_AMBULATORY_CARE_PROVIDER_SITE_OTHER): Payer: PPO | Admitting: Podiatry

## 2020-06-17 ENCOUNTER — Other Ambulatory Visit: Payer: Self-pay

## 2020-06-17 DIAGNOSIS — L600 Ingrowing nail: Secondary | ICD-10-CM

## 2020-06-17 DIAGNOSIS — M2041 Other hammer toe(s) (acquired), right foot: Secondary | ICD-10-CM

## 2020-06-17 DIAGNOSIS — Z9889 Other specified postprocedural states: Secondary | ICD-10-CM

## 2020-06-17 NOTE — Patient Instructions (Signed)
Leave the toes open to air at this point

## 2020-06-18 ENCOUNTER — Encounter: Payer: Self-pay | Admitting: Podiatry

## 2020-06-18 NOTE — Progress Notes (Signed)
  Subjective:  Patient ID: LOT MEDFORD, male    DOB: 1945-05-15,  MRN: [id number]  Chief Complaint  Patient presents with  . Nail Problem    PT stated that he is concerned about his toes being infected     DOS: 05/28/2020 Procedure: Left fourth, bilateral hallux permanent total nail avulsion, injection of plantar fibroma, arthroplasty right fifth toe  75 y.o. male returns for post-op check.  The toenail avulsions are quite painful  Review of Systems: Negative except as noted in the HPI. Denies N/V/F/Ch.   Objective:  There were no vitals filed for this visit. There is no height or weight on file to calculate BMI. Constitutional Well developed. Well nourished.  Vascular Foot warm and well perfused. Capillary refill normal to all digits.   Neurologic Normal speech. Oriented to person, place, and time. Epicritic sensation to light touch grossly present bilaterally.  Dermatologic  matricectomy sites are healing well, there is some maceration and nailfold erythema without signs of cellulitis or purulence.  Sutures are intact and well coapted.    Orthopedic: Tenderness to palpation noted about the surgical site.   Radiographs: Status post arthroplasty of the right fifth toe Assessment:   1. Status post surgery   2. Hammertoe of right foot   3. Ingrowing left great toenail   4. Ingrowing right great toenail    Plan:  Patient was evaluated and treated and all questions answered.  S/p foot surgery right -Progressing as expected post-operatively. -XR: As above -WB Status: May return to shoe gear -Sutures: removed today -Medications: no further antibiotics -advised  To leave matricectomy sites open to air  Return in about 2 weeks (around 07/01/2020).

## 2020-06-22 ENCOUNTER — Ambulatory Visit: Payer: PPO | Admitting: Podiatry

## 2020-06-24 ENCOUNTER — Encounter: Payer: PPO | Admitting: Podiatry

## 2020-06-24 DIAGNOSIS — M1711 Unilateral primary osteoarthritis, right knee: Secondary | ICD-10-CM | POA: Diagnosis not present

## 2020-06-24 DIAGNOSIS — M25532 Pain in left wrist: Secondary | ICD-10-CM | POA: Diagnosis not present

## 2020-06-29 DIAGNOSIS — N401 Enlarged prostate with lower urinary tract symptoms: Secondary | ICD-10-CM | POA: Diagnosis not present

## 2020-06-29 DIAGNOSIS — R350 Frequency of micturition: Secondary | ICD-10-CM | POA: Diagnosis not present

## 2020-07-01 ENCOUNTER — Ambulatory Visit (INDEPENDENT_AMBULATORY_CARE_PROVIDER_SITE_OTHER): Payer: PPO | Admitting: Podiatry

## 2020-07-01 ENCOUNTER — Other Ambulatory Visit: Payer: Self-pay

## 2020-07-01 ENCOUNTER — Ambulatory Visit: Payer: PPO

## 2020-07-01 DIAGNOSIS — M2042 Other hammer toe(s) (acquired), left foot: Secondary | ICD-10-CM

## 2020-07-01 DIAGNOSIS — L600 Ingrowing nail: Secondary | ICD-10-CM

## 2020-07-01 DIAGNOSIS — Z9889 Other specified postprocedural states: Secondary | ICD-10-CM

## 2020-07-04 NOTE — Progress Notes (Signed)
  Subjective:  Patient ID: Michael Lutz, male    DOB: [birth year],  MRN: [id number]  Chief Complaint  Patient presents with  . Nail Problem    PT states right great toe he hit the toe and it has been having discharge. - 10 days ago  PT also states that right 5th toe had a scab that he picked off and had been bleeding - 5 days ago    DOS: 05/28/2020 Procedure: Left fourth, bilateral hallux permanent total nail avulsion, injection of plantar fibroma, arthroplasty right fifth toe  75 y.o. male returns for post-op check.  The toenail avulsions are quite painful, he is difficulty trying to leave it open to air and keeps bandages on it  Review of Systems: Negative except as noted in the HPI. Denies N/V/F/Ch.   Objective:  There were no vitals filed for this visit. There is no height or weight on file to calculate BMI. Constitutional Well developed. Well nourished.  Vascular Foot warm and well perfused. Capillary refill normal to all digits.   Neurologic Normal speech. Oriented to person, place, and time. Epicritic sensation to light touch grossly present bilaterally.  Dermatologic  matricectomy sites are healing well, there is some maceration and nailfold erythema without signs of cellulitis or purulence.  Sutures are intact and well coapted.    Orthopedic: Tenderness to palpation noted about the surgical site.   Radiographs: Status post arthroplasty of the right fifth toe Assessment:   1. Hammertoe of left foot   2. Ingrowing left great toenail   3. Ingrowing right great toenail   4. Post-operative state    Plan:  Patient was evaluated and treated and all questions answered.  S/p foot surgery right -Progressing as expected post-operatively. -Regular shoe gear as tolerated -Once again advised him to leave open areas of the scab up and continue to heal, I think they are persistently painful because of the bandages and he is having persistent reaction to the phenolic acid  No  follow-ups on file.

## 2020-07-05 ENCOUNTER — Telehealth: Payer: Self-pay | Admitting: Podiatry

## 2020-07-05 MED ORDER — CEPHALEXIN 500 MG PO CAPS: 500.0000 mg | ORAL_CAPSULE | Freq: Three times a day (TID) | ORAL | 0 refills | Status: DC

## 2020-07-05 NOTE — Addendum Note (Signed)
Addended bySherryle Lis, Analiyah Lechuga R on: 07/05/2020 05:36 PM   Modules accepted: Orders

## 2020-07-05 NOTE — Telephone Encounter (Signed)
Pt called and stated that he was suppose to receive an antibiotic on Thursday at his pharmacy and he was calling to follow up.

## 2020-07-06 DIAGNOSIS — M62838 Other muscle spasm: Secondary | ICD-10-CM | POA: Diagnosis not present

## 2020-07-06 DIAGNOSIS — Z79891 Long term (current) use of opiate analgesic: Secondary | ICD-10-CM | POA: Diagnosis not present

## 2020-07-06 DIAGNOSIS — M961 Postlaminectomy syndrome, not elsewhere classified: Secondary | ICD-10-CM | POA: Diagnosis not present

## 2020-07-06 DIAGNOSIS — G894 Chronic pain syndrome: Secondary | ICD-10-CM | POA: Diagnosis not present

## 2020-07-20 ENCOUNTER — Ambulatory Visit: Payer: PPO | Admitting: Podiatry

## 2020-07-22 ENCOUNTER — Other Ambulatory Visit: Payer: Self-pay

## 2020-07-22 ENCOUNTER — Ambulatory Visit (INDEPENDENT_AMBULATORY_CARE_PROVIDER_SITE_OTHER): Payer: PPO

## 2020-07-22 ENCOUNTER — Ambulatory Visit (INDEPENDENT_AMBULATORY_CARE_PROVIDER_SITE_OTHER): Payer: PPO | Admitting: Podiatry

## 2020-07-22 DIAGNOSIS — M2042 Other hammer toe(s) (acquired), left foot: Secondary | ICD-10-CM

## 2020-07-22 DIAGNOSIS — L84 Corns and callosities: Secondary | ICD-10-CM

## 2020-07-22 DIAGNOSIS — M722 Plantar fascial fibromatosis: Secondary | ICD-10-CM | POA: Diagnosis not present

## 2020-07-22 DIAGNOSIS — L600 Ingrowing nail: Secondary | ICD-10-CM

## 2020-07-22 MED ORDER — TRIAMCINOLONE ACETONIDE 10 MG/ML IJ SUSP
10.0000 mg | Freq: Once | INTRAMUSCULAR | Status: AC
  Administered 2020-07-22: 10 mg

## 2020-07-23 NOTE — Progress Notes (Signed)
Subjective:   Patient ID: Michael Lutz, male   DOB: 75 y.o.   MRN: [id number]   HPI Patient presents stating a spot on the bottom my right foot is really bothering me and its inflamed and I have a lesion also that becomes painful when I walk on   ROS      Objective:  Physical Exam  Neurovascular status intact with inflammation of the mid band of the plantar fascial right moderately tender when pressed with keratotic tissue formation occurring     Assessment:  Inflammatory fasciitis right with keratotic lesion formation     Plan:  H&P sterile prep injected the fascia 3 mg dexamethasone Kenalog 5 mg Xylocaine debrided the lesion advised on heat ice therapy and reappoint to recheck  X-rays right were negative for calcification associated with his good alignment noted with moderate depression of the arch

## 2020-08-03 DIAGNOSIS — M25561 Pain in right knee: Secondary | ICD-10-CM | POA: Diagnosis not present

## 2020-08-03 DIAGNOSIS — I700 Atherosclerosis of aorta: Secondary | ICD-10-CM | POA: Diagnosis not present

## 2020-08-03 DIAGNOSIS — L749 Eccrine sweat disorder, unspecified: Secondary | ICD-10-CM | POA: Diagnosis not present

## 2020-08-03 DIAGNOSIS — I714 Abdominal aortic aneurysm, without rupture: Secondary | ICD-10-CM | POA: Diagnosis not present

## 2020-08-03 DIAGNOSIS — E7800 Pure hypercholesterolemia, unspecified: Secondary | ICD-10-CM | POA: Diagnosis not present

## 2020-08-03 DIAGNOSIS — J439 Emphysema, unspecified: Secondary | ICD-10-CM | POA: Diagnosis not present

## 2020-08-03 DIAGNOSIS — I10 Essential (primary) hypertension: Secondary | ICD-10-CM | POA: Diagnosis not present

## 2020-08-03 DIAGNOSIS — R2681 Unsteadiness on feet: Secondary | ICD-10-CM | POA: Diagnosis not present

## 2020-08-03 DIAGNOSIS — M25562 Pain in left knee: Secondary | ICD-10-CM | POA: Diagnosis not present

## 2020-08-03 DIAGNOSIS — Z8546 Personal history of malignant neoplasm of prostate: Secondary | ICD-10-CM | POA: Diagnosis not present

## 2020-08-03 DIAGNOSIS — Z87891 Personal history of nicotine dependence: Secondary | ICD-10-CM | POA: Diagnosis not present

## 2020-08-03 DIAGNOSIS — N401 Enlarged prostate with lower urinary tract symptoms: Secondary | ICD-10-CM | POA: Diagnosis not present

## 2020-08-10 DIAGNOSIS — H26492 Other secondary cataract, left eye: Secondary | ICD-10-CM | POA: Diagnosis not present

## 2020-08-10 DIAGNOSIS — Z961 Presence of intraocular lens: Secondary | ICD-10-CM | POA: Diagnosis not present

## 2020-08-10 DIAGNOSIS — H40013 Open angle with borderline findings, low risk, bilateral: Secondary | ICD-10-CM | POA: Diagnosis not present

## 2020-08-10 DIAGNOSIS — H524 Presbyopia: Secondary | ICD-10-CM | POA: Diagnosis not present

## 2020-08-10 DIAGNOSIS — H04123 Dry eye syndrome of bilateral lacrimal glands: Secondary | ICD-10-CM | POA: Diagnosis not present

## 2020-08-13 ENCOUNTER — Other Ambulatory Visit (HOSPITAL_COMMUNITY): Payer: Self-pay | Admitting: Sports Medicine

## 2020-08-13 DIAGNOSIS — M79604 Pain in right leg: Secondary | ICD-10-CM | POA: Diagnosis not present

## 2020-08-13 DIAGNOSIS — M1712 Unilateral primary osteoarthritis, left knee: Secondary | ICD-10-CM | POA: Diagnosis not present

## 2020-08-16 DIAGNOSIS — F319 Bipolar disorder, unspecified: Secondary | ICD-10-CM | POA: Diagnosis not present

## 2020-08-16 DIAGNOSIS — E7800 Pure hypercholesterolemia, unspecified: Secondary | ICD-10-CM | POA: Diagnosis not present

## 2020-08-16 DIAGNOSIS — Z8546 Personal history of malignant neoplasm of prostate: Secondary | ICD-10-CM | POA: Diagnosis not present

## 2020-08-16 DIAGNOSIS — M170 Bilateral primary osteoarthritis of knee: Secondary | ICD-10-CM | POA: Diagnosis not present

## 2020-08-16 DIAGNOSIS — I2510 Atherosclerotic heart disease of native coronary artery without angina pectoris: Secondary | ICD-10-CM | POA: Diagnosis not present

## 2020-08-16 DIAGNOSIS — I10 Essential (primary) hypertension: Secondary | ICD-10-CM | POA: Diagnosis not present

## 2020-08-16 DIAGNOSIS — N401 Enlarged prostate with lower urinary tract symptoms: Secondary | ICD-10-CM | POA: Diagnosis not present

## 2020-08-16 DIAGNOSIS — J439 Emphysema, unspecified: Secondary | ICD-10-CM | POA: Diagnosis not present

## 2020-08-16 DIAGNOSIS — C61 Malignant neoplasm of prostate: Secondary | ICD-10-CM | POA: Diagnosis not present

## 2020-08-16 DIAGNOSIS — M8580 Other specified disorders of bone density and structure, unspecified site: Secondary | ICD-10-CM | POA: Diagnosis not present

## 2020-08-19 DIAGNOSIS — M1712 Unilateral primary osteoarthritis, left knee: Secondary | ICD-10-CM | POA: Diagnosis not present

## 2020-08-19 DIAGNOSIS — M79604 Pain in right leg: Secondary | ICD-10-CM | POA: Diagnosis not present

## 2020-08-20 ENCOUNTER — Other Ambulatory Visit (HOSPITAL_COMMUNITY): Payer: Self-pay | Admitting: Sports Medicine

## 2020-08-20 ENCOUNTER — Ambulatory Visit (HOSPITAL_COMMUNITY)
Admission: RE | Admit: 2020-08-20 | Discharge: 2020-08-20 | Disposition: A | Discharge status: Home / Self Care | Payer: PPO | Source: Ambulatory Visit | Attending: Sports Medicine | Admitting: Sports Medicine

## 2020-08-20 ENCOUNTER — Ambulatory Visit (HOSPITAL_BASED_OUTPATIENT_CLINIC_OR_DEPARTMENT_OTHER)
Admission: RE | Admit: 2020-08-20 | Discharge: 2020-08-20 | Disposition: A | Discharge status: Home / Self Care | Payer: PPO | Source: Ambulatory Visit | Attending: Sports Medicine | Admitting: Sports Medicine

## 2020-08-20 ENCOUNTER — Other Ambulatory Visit: Payer: Self-pay

## 2020-08-20 DIAGNOSIS — M7989 Other specified soft tissue disorders: Secondary | ICD-10-CM | POA: Insufficient documentation

## 2020-08-20 DIAGNOSIS — M79604 Pain in right leg: Secondary | ICD-10-CM | POA: Diagnosis not present

## 2020-08-20 NOTE — Progress Notes (Signed)
Lower extremity venous & ABI has been completed.   Preliminary results in CV Proc.   Abram Sander 08/20/2020 3:25 PM

## 2020-08-23 ENCOUNTER — Telehealth: Payer: Self-pay

## 2020-08-23 NOTE — Telephone Encounter (Signed)
Patient calls to report pain in his RLE when he walks short distances. Says the distance he can walk is getting shorter and the recovery time is getting longer. He denies any ulcers. From PCP referral, he had ABIs done on 6/10 - showed moderate RLE arterial disease. Brining him in on [facility] schedule for evaluation.

## 2020-08-24 ENCOUNTER — Encounter: Payer: Self-pay | Admitting: Podiatry

## 2020-08-24 ENCOUNTER — Other Ambulatory Visit: Payer: Self-pay

## 2020-08-24 ENCOUNTER — Ambulatory Visit (INDEPENDENT_AMBULATORY_CARE_PROVIDER_SITE_OTHER): Payer: PPO | Admitting: Podiatry

## 2020-08-24 DIAGNOSIS — B351 Tinea unguium: Secondary | ICD-10-CM

## 2020-08-24 DIAGNOSIS — M79675 Pain in left toe(s): Secondary | ICD-10-CM | POA: Diagnosis not present

## 2020-08-24 DIAGNOSIS — M2041 Other hammer toe(s) (acquired), right foot: Secondary | ICD-10-CM

## 2020-08-24 DIAGNOSIS — M79674 Pain in right toe(s): Secondary | ICD-10-CM

## 2020-08-24 DIAGNOSIS — I739 Peripheral vascular disease, unspecified: Secondary | ICD-10-CM | POA: Diagnosis not present

## 2020-08-24 DIAGNOSIS — L84 Corns and callosities: Secondary | ICD-10-CM

## 2020-08-24 DIAGNOSIS — L600 Ingrowing nail: Secondary | ICD-10-CM

## 2020-08-24 DIAGNOSIS — M2042 Other hammer toe(s) (acquired), left foot: Secondary | ICD-10-CM

## 2020-08-24 NOTE — Progress Notes (Signed)
  Subjective:  Patient ID: Michael Lutz, male    DOB: [birth year],  MRN: [id number]  Chief Complaint  Patient presents with   Nail Problem     Follow up //// toenail regrowth (pt requested removal)    DOS: 05/28/2020 Procedure: Left fourth, bilateral hallux permanent total nail avulsion, injection of plantar fibroma, arthroplasty right fifth toe  75 y.o. male returns for post-op check.  Overall doing well he had some vascular testing done and has blockages in his right leg he is seeing Dr. Oneida Alar for this.  Surgical sites are previously no longer painful.  The nail sites flaked off some dead skin.  The rest of his toenails are painful and causing him discomfort and he is unable to cut them.  Review of Systems: Negative except as noted in the HPI. Denies N/V/F/Ch.   Objective:  There were no vitals filed for this visit. There is no height or weight on file to calculate BMI. Constitutional Well developed. Well nourished.  Vascular Foot warm and well perfused. Capillary refill normal to all digits.   Neurologic Normal speech. Oriented to person, place, and time. Epicritic sensation to light touch grossly present bilaterally.  Dermatologic  matricectomy sites are healing well, there is some maceration and nailfold erythema without signs of cellulitis or purulence.  Sutures are intact and well coapted.  Remainder 7 nails are mycotic with thickened elongated nail with nail plates with severe dystrophy  Orthopedic: Tenderness to palpation noted about the surgical site.   Radiographs: Status post arthroplasty of the right fifth toe Assessment:   1. Pain due to onychomycosis of toenails of both feet   2. Peripheral arterial disease ([place])   3. Ingrowing left great toenail   4. Hammertoe of left foot   5. Corns and callosities   6. Ingrowing right great toenail   7. Hammertoe of right foot    Plan:  Patient was evaluated and treated and all questions answered.  S/p foot surgery  right -Overall doing well now several months after his surgery.  Can resume regular shoe gear for this.  He still has severe hammertoe deformity that I think will not be able to address due to his history of prior surgeries and now on the right side his peripheral arterial disease it would be quite a significant undertaking to correct the deformities  Discussed the etiology and treatment options for the condition in detail with the patient. Educated patient on the topical and oral treatment options for mycotic nails. Recommended debridement of the nails today. Sharp and mechanical debridement performed of all painful and mycotic nails today. Nails debrided in length and thickness using a nail nipper to level of comfort. Discussed treatment options including appropriate shoe gear. Follow up as needed for painful nails.    No follow-ups on file.

## 2020-08-31 DIAGNOSIS — G894 Chronic pain syndrome: Secondary | ICD-10-CM | POA: Diagnosis not present

## 2020-08-31 DIAGNOSIS — Z79891 Long term (current) use of opiate analgesic: Secondary | ICD-10-CM | POA: Diagnosis not present

## 2020-08-31 DIAGNOSIS — M961 Postlaminectomy syndrome, not elsewhere classified: Secondary | ICD-10-CM | POA: Diagnosis not present

## 2020-08-31 DIAGNOSIS — M62838 Other muscle spasm: Secondary | ICD-10-CM | POA: Diagnosis not present

## 2020-09-05 NOTE — H&P (View-Only) (Signed)
[facility] [place]  ASSESSMENT / PLAN: Michael Lutz is a 75 y.o. 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  Patient counseled patients with asymptomatic peripheral arterial disease or claudication have a 1-2% risk of developing chronic limb threatening ischemia, but a 15-30% risk of mortality in the next 5 years. Intervention should only be considered for medically optimized patients with disabling symptoms. .  Recommend the following which can slow the progression of atherosclerosis and reduce the risk of major adverse cardiac / limb events:  Complete cessation from all tobacco products. Blood glucose control with goal A1c < 7%. Blood pressure control with goal blood pressure < 140/90 mmHg. Lipid reduction therapy with goal LDL-C <100 mg/dL (<70 if symptomatic from PAD).  Aspirin 81mg  PO QD.  Atorvastatin 40-80mg  PO QD (or other "high intensity" statin therapy).  Plan right lower extremity angiogram with possible intervention via right common femoral antegrade approach vs. Left brachial artery approach in cath lab.   CHIEF COMPLAINT: right calf pain  HISTORY OF PRESENT ILLNESS: Michael Lutz is a 75 y.o. male who returns to clinic for evaluation of disabling right lower extremity pain.  The patient reports he cannot walk 30 feet without severe cramping in his calf, requiring him to stop.  He does not have symptoms of ischemic rest pain.  He has no ulcers about his right foot.  He is status post endovascular aortic reconstruction in 2011.  He had a CTA performed 02/20/2020 for concern of an enlarging endoleak.  This was negative for endoleak.  Past Medical History:  Diagnosis Date   AAA (abdominal aortic aneurysm) (HCC)    ADD (attention deficit disorder)    Anxiety    takes Clonazepam daily   Arthritis    left knee   Bipolar 1 disorder (HCC)    takes Seroquel nightly    Bipolar affective disorder ([place])    in remission on meds   Blood transfusion    several times   CHF (congestive heart failure) (HCC)    Chronic back pain    [facility]   Chronic back pain    scoliosis   Chronic low back pain 07/31/2013   Colon polyps    Dr Cristina Gong does a colonoscopy every 5 years   Constipation    takes Colace prn   Depression    takes Effexor daily   Dysrhythmia    palpitations- sees dr Irish Lack   Enlarged prostate    slightly   Full dentures    GERD (gastroesophageal reflux disease)    takes Zantac daily   Heart murmur    arrythmia   History of shingles    Hx of scabies Mid Feb 2013   Hx of seasonal allergies    Hyperlipidemia    takes Niacin daily and Lipitor daily   Hypertension    takes Amlodipine and Quinapril daily   Insomnia    takes Trazodone nightly   Joint pain    Leg pain 05-04-10   with walking   Memory loss    short and long term   Nocturia    Peripheral edema    takes Furosemide prn   Peripheral vascular disease (HCC)    s/p AAA stent   Prostate cancer ([place])    Urinary urgency    Wears glasses     Past Surgical History:  Procedure Laterality Date   ABDOMINAL AORTIC ANEURYSM REPAIR  11/2009   Stent graft repair   BACK SURGERY     thoracic spine   CARDIAC CATHETERIZATION     15 yrs ago   CHOLECYSTECTOMY     COLONOSCOPY     FOOT SURGERY     Multiple surgeries on right foot   GANGLION CYST EXCISION Right 06/07/2012   Procedure: DISTAL POLE EXCISION RIGHT WRIST;  Surgeon: Wynonia Sours, MD;  Location: [facility];  Service: Orthopedics;  Laterality: Right;   GOLD SEED IMPLANT N/A 09/08/2019   Procedure: GOLD SEED IMPLANT;  Surgeon: Franchot Gallo, MD;  Location: [facility];  Service: Urology;  Laterality: N/A;  30 MINS   SHOULDER SURGERY     Right shoulder   SPACE OAR INSTILLATION N/A 09/08/2019   Procedure: SPACE OAR INSTILLATION;  Surgeon: Franchot Gallo, MD;  Location: [facility];  Service:  Urology;  Laterality: N/A;   SPINAL CORD STIMULATOR IMPLANT  2009   NOVI [id number]  MRI UNSAFE   SPINAL FUSION  04/04/2011   Procedure: FUSION POSTERIOR SPINAL MULTILEVEL/SCOLIOSIS;  Surgeon: Gunnar Bulla, MD;  Location: [facility];  Service: Orthopedics;  Laterality: N/A;  repair thoracic and lumbar  pseudoarthrosis, revise spinal cord stimulator, extend thoracic fusion, iliac crest bone graft   SPINE SURGERY  12/2009   Spinal Fusion by Dr. Marcial Pacas   TESTICLE SURGERY     right testicle   TONSILLECTOMY     TURP VAPORIZATION     VASCULAR SURGERY     AAA stent repair    Family History  Problem Relation Age of Onset   Melanoma Mother    Diabetes Father    Heart attack Father    Prostate cancer Father    Arthritis Sister        rheumatoid   Breast cancer Sister    Breast cancer Maternal Grandmother    Anesthesia problems Neg Hx    Hypotension Neg Hx    Malignant hyperthermia Neg Hx    Pseudochol deficiency Neg Hx    Pancreatic cancer Neg Hx    Colon cancer Neg Hx     Social History   Socioeconomic History   Marital status: Single    Spouse name: Not on file   Number of children: Not on file   Years of education: Not on file   Highest education level: Not on file  Occupational History   Occupation: Retired  Tobacco Use   Smoking status: Former    Packs/day: 1.00    Years: 50.00    Pack years: 50.00    Types: Cigarettes    Quit date: 10/08/2013    Years since quitting: 6.9   Smokeless tobacco: Never   Tobacco comments:    states smokes "little cigars" - former  Vaping Use   Vaping Use: Never used  Substance and Sexual Activity   Alcohol use: No    Alcohol/week: 0.0 standard drinks   Drug use: No   Sexual activity: Not Currently  Other Topics Concern   Not on file  Social History Narrative   Not on file   Social Determinants of Health   Financial Resource Strain: Not on file  Food Insecurity: Not on file  Transportation Needs: Not on file  Physical Activity:  Not on file  Stress: Not on file  Social Connections: Not on file  Intimate Partner Violence: Not on file    No Known Allergies  Current Outpatient Medications  Medication Sig Dispense Refill   acetaminophen (TYLENOL) 500 MG tablet  Take 500-1,000 mg by mouth every 6 (six) hours as needed (for pain.).     amLODipine (NORVASC) 10 MG tablet Take 10 mg by mouth daily.      Ascorbic Acid (VITAMIN C) 1000 MG tablet Take 1,000 mg by mouth daily.      aspirin 325 MG tablet Take 162.5 mg by mouth daily.      atorvastatin (LIPITOR) 40 MG tablet Take 40 mg by mouth daily.     b complex vitamins tablet Take 1 tablet by mouth daily.      buPROPion (WELLBUTRIN XL) 150 MG 24 hr tablet Take 450 mg by mouth daily.     Calcium Carb-Cholecalciferol (CALCIUM 600+D3 PO) Take 2 tablets by mouth every evening.     calcium carbonate (SUPER CALCIUM) 1500 (600 Ca) MG TABS tablet 2 tablets     cephALEXin (KEFLEX) 500 MG capsule Take 1 capsule (500 mg total) by mouth 3 (three) times daily. 30 capsule 0   chlorpheniramine (ALLERGY) 4 MG tablet 1 tablet as needed     cholecalciferol (VITAMIN D) 1000 UNITS tablet Take 1,000 Units by mouth daily.      clonazePAM (KLONOPIN) 1 MG tablet Take 1 mg by mouth See admin instructions. Take 1 tablet (1 mg) by mouth daily if needed for anxiety & take 1 tablet (1 mg) by mouth scheduled at bedtime.     diclofenac Sodium (VOLTAREN) 1 % GEL See admin instructions.     diphenhydrAMINE (BENADRYL) 25 mg capsule Take 25 mg by mouth every 6 (six) hours as needed for itching or allergies.      famotidine (PEPCID) 20 MG tablet Take 20 mg by mouth daily.     fentaNYL (DURAGESIC) 75 MCG/HR Place 1 patch onto the skin every other day.     furosemide (LASIX) 40 MG tablet Take 80 mg by mouth daily as needed (fluid retention.).      gabapentin (NEURONTIN) 300 MG capsule Take 600 mg by mouth at bedtime.     ibuprofen (ADVIL,MOTRIN) 200 MG tablet Take 200-400 mg by mouth every 8 (eight) hours as  needed (pain.). For pain     magnesium oxide (MAG-OX) 400 MG tablet Take 800 mg by mouth every evening.     methocarbamol (ROBAXIN) 750 MG tablet Take 750 mg by mouth every 8 (eight) hours.     Multiple Vitamin (MULTI VITAMIN) TABS 1 tablet     Multiple Vitamin (MULTIVITAMIN WITH MINERALS) TABS tablet Take 1 tablet by mouth daily.     nystatin (MYCOSTATIN/NYSTOP) powder Apply 1 application topically 2 (two) times daily as needed (skin irritation/rash.).      Omega-3 Fatty Acids (FISH OIL) 1200 MG CAPS Take 2,400 mg by mouth every evening.     polyethylene glycol powder (MIRALAX) 17 GM/SCOOP powder See admin instructions.     Potassium 99 MG TABS Take 198 mg by mouth every evening.      pregabalin (LYRICA) 150 MG capsule Take 300 mg by mouth at bedtime.     pseudoephedrine (SUDAFED) 30 MG tablet Take 60 mg by mouth every 4 (four) hours as needed for congestion.     QUEtiapine (SEROQUEL) 100 MG tablet Take 1 tablet (100 mg total) by mouth at bedtime.     quinapril (ACCUPRIL) 40 MG tablet Take 80 mg by mouth daily.     RESTASIS 0.05 % ophthalmic emulsion Place 1 drop into both eyes 2 (two) times daily as needed (dry/irritated eyes.).     simethicone (MYLICON) 638 MG chewable  tablet 1 tablet     sulfamethoxazole-trimethoprim (BACTRIM DS) 800-160 MG tablet Take 1 tablet by mouth 2 (two) times daily. 14 tablet 0   tamsulosin (FLOMAX) 0.4 MG CAPS capsule Take 0.4 mg by mouth daily.     terbinafine (LAMISIL) 250 MG tablet Take 250 mg by mouth daily.     traZODone (DESYREL) 100 MG tablet Take 1 tablet (100 mg total) by mouth at bedtime.     Venlafaxine HCl 75 MG TB24 Take 225 mg by mouth daily.     vitamin E 180 MG (400 UNITS) capsule Take 400 Units by mouth daily.     No current facility-administered medications for this visit.    REVIEW OF SYSTEMS:  [X]  denotes positive finding, [ ]  denotes negative finding Cardiac  Comments:  Chest pain or chest pressure:    Shortness of breath upon exertion:     Short of breath when lying flat:    Irregular heart rhythm:        Vascular    Pain in calf, thigh, or hip brought on by ambulation: x   Pain in feet at night that wakes you up from your sleep:     Blood clot in your veins:    Leg swelling:         Pulmonary    Oxygen at home:    Productive cough:     Wheezing:         Neurologic    Sudden weakness in arms or legs:     Sudden numbness in arms or legs:     Sudden onset of difficulty speaking or slurred speech:    Temporary loss of vision in one eye:     Problems with dizziness:         Gastrointestinal    Blood in stool:     Vomited blood:         Genitourinary    Burning when urinating:     Blood in urine:        Psychiatric    Major depression:         Hematologic    Bleeding problems:    Problems with blood clotting too easily:        Skin    Rashes or ulcers:        Constitutional    Fever or chills:      PHYSICAL EXAM Vitals:   09/07/20 1342  BP: (!) 149/82  Pulse: 76  Resp: 20  Temp: 97.9 F (36.6 C)  SpO2: 93%  Weight: 273 lb (123.8 kg)    Constitutional: chronically ill appearing. no distress. Appears well nourished.  Neurologic: CN intact. no focal findings. no sensory loss. Psychiatric:  Mood and affect symmetric and appropriate. Eyes:  No icterus. No conjunctival pallor. Ears, nose, throat:  mucous membranes moist. Midline trachea.  Cardiac:  regular rate and rhythm.  Respiratory:  unlabored. Abdominal: obese. soft, non-tender, non-distended.  Peripheral vascular: no palpable pedal pulses. Unable to palpate femoral pulses 2/2 pannus. Extremity: no edema. no cyanosis. no pallor.  Skin: no gangrene. no ulceration.  Lymphatic: no Stemmer's sign. no palpable lymphadenopathy.  PERTINENT LABORATORY AND RADIOLOGIC DATA  Most recent CBC CBC Latest Ref Rng & Units 09/04/2019 06/04/2016 06/02/2016  WBC 4.0 - 10.5 K/uL 6.9 9.1 8.7  Hemoglobin 13.0 - 17.0 g/dL 16.0 15.1 15.5  Hematocrit 39.0 -  52.0 % 50.6 45.9 46.2  Platelets 150 - 400 K/uL 142(L) 116(L) 114(L)     Most recent  CMP CMP Latest Ref Rng & Units 09/04/2019 06/04/2016 06/02/2016  Glucose 70 - 99 mg/dL 87 94 83  BUN 8 - 23 mg/dL 24(H) 26(H) 22(H)  Creatinine 0.61 - 1.24 mg/dL 1.00 1.03 1.07  Sodium 135 - 145 mmol/L 139 139 138  Potassium 3.5 - 5.1 mmol/L 4.5 3.9 3.1(L)  Chloride 98 - 111 mmol/L 106 104 104  CO2 22 - 32 mmol/L 24 27 23   Calcium 8.9 - 10.3 mg/dL 8.9 9.2 9.1  Total Protein 6.5 - 8.1 g/dL - 6.5 -  Total Bilirubin 0.3 - 1.2 mg/dL - 0.8 -  Alkaline Phos 38 - 126 U/L - 67 -  AST 15 - 41 U/L - 31 -  ALT 17 - 63 U/L - 28 -   LOWER EXTREMITY DOPPLER STUDY   Patient Name:  Michael Lutz  Date of Exam:   08/20/2020  Medical Rec #: [id number]       Accession #:    [id number]  Date of Birth: 1945/05/03       Patient Gender: M  Patient Age:   68Y  Exam Location:  [facility]  Procedure:      VAS [place] ABI WITH/WO TBI  Referring Phys: [id number] Inez Catalina    ---------------------------------------------------------------------------  -----     Indications: Claudication.   High Risk Factors: Hypertension, hyperlipidemia.     Comparison Study: prior 05/21/19   Performing Technologist: Archie Patten RVS      Examination Guidelines: A complete evaluation includes at minimum, Doppler  waveform signals and systolic blood pressure reading at the level of  bilateral  brachial, anterior tibial, and posterior tibial arteries, when vessel  segments  are accessible. Bilateral testing is considered an integral part of a  complete  examination. Photoelectric Plethysmograph (PPG) waveforms and toe systolic  pressure readings are included as required and additional duplex testing  as  needed. Limited examinations for reoccurring indications may be performed  as  noted.      ABI Findings:  +---------+------------------+-----+----------+----------+  Right    Rt Pressure (mmHg)IndexWaveform   Comment     +---------+------------------+-----+----------+----------+  Brachial 166                    triphasic             +---------+------------------+-----+----------+----------+  PTA      112               0.67 biphasic              +---------+------------------+-----+----------+----------+  DP       93                0.56 monophasic            +---------+------------------+-----+----------+----------+  Great Toe128               0.77           unreliable  +---------+------------------+-----+----------+----------+   +---------+------------------+-----+---------+-------+  Left     Lt Pressure (mmHg)IndexWaveform Comment  +---------+------------------+-----+---------+-------+  Brachial 156                    triphasic         +---------+------------------+-----+---------+-------+  PTA      195               1.17 triphasic         +---------+------------------+-----+---------+-------+  DP       141  0.85 triphasic         +---------+------------------+-----+---------+-------+  Great Toe165               0.99 Normal            +---------+------------------+-----+---------+-------+   +-------+-----------+-----------+------------+------------+  ABI/TBIToday's ABIToday's TBIPrevious ABIPrevious TBI  +-------+-----------+-----------+------------+------------+  Right  0.67       0.77                                 +-------+-----------+-----------+------------+------------+  Left   1.17       0.99                                 +-------+-----------+-----------+------------+------------+          Summary:  Right: Resting right ankle-brachial index indicates moderate right lower  extremity arterial disease. TBIs are unreliable.   Left: Resting left ankle-brachial index is within normal range. No  evidence of significant left lower extremity arterial disease. The left  toe-brachial index is  normal.       *See table(s) above for measurements and observations.       Electronically signed by Jamelle Haring on 08/20/2020 at 9:18:15 PM.   [name]. Stanford Breed, MD [facility] Phone Number: [phone] 09/05/2020 11:13 AM

## 2020-09-05 NOTE — Progress Notes (Signed)
[facility] [place]  ASSESSMENT / PLAN: Michael Lutz is a 75 y.o. 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  Patient counseled patients with asymptomatic peripheral arterial disease or claudication have a 1-2% risk of developing chronic limb threatening ischemia, but a 15-30% risk of mortality in the next 5 years. Intervention should only be considered for medically optimized patients with disabling symptoms. .  Recommend the following which can slow the progression of atherosclerosis and reduce the risk of major adverse cardiac / limb events:  Complete cessation from all tobacco products. Blood glucose control with goal A1c < 7%. Blood pressure control with goal blood pressure < 140/90 mmHg. Lipid reduction therapy with goal LDL-C <100 mg/dL (<70 if symptomatic from PAD).  Aspirin 81mg  PO QD.  Atorvastatin 40-80mg  PO QD (or other "high intensity" statin therapy).  Plan right lower extremity angiogram with possible intervention via right common femoral antegrade approach vs. Left brachial artery approach in cath lab.   CHIEF COMPLAINT: right calf pain  HISTORY OF PRESENT ILLNESS: Michael Lutz is a 75 y.o. male who returns to clinic for evaluation of disabling right lower extremity pain.  The patient reports he cannot walk 30 feet without severe cramping in his calf, requiring him to stop.  He does not have symptoms of ischemic rest pain.  He has no ulcers about his right foot.  He is status post endovascular aortic reconstruction in 2011.  He had a CTA performed 02/20/2020 for concern of an enlarging endoleak.  This was negative for endoleak.  Past Medical History:  Diagnosis Date   AAA (abdominal aortic aneurysm) (HCC)    ADD (attention deficit disorder)    Anxiety    takes Clonazepam daily   Arthritis    left knee   Bipolar 1 disorder (HCC)    takes Seroquel nightly    Bipolar affective disorder ([place])    in remission on meds   Blood transfusion    several times   CHF (congestive heart failure) (HCC)    Chronic back pain    [facility]   Chronic back pain    scoliosis   Chronic low back pain 07/31/2013   Colon polyps    Dr Cristina Gong does a colonoscopy every 5 years   Constipation    takes Colace prn   Depression    takes Effexor daily   Dysrhythmia    palpitations- sees dr Irish Lack   Enlarged prostate    slightly   Full dentures    GERD (gastroesophageal reflux disease)    takes Zantac daily   Heart murmur    arrythmia   History of shingles    Hx of scabies Mid Feb 2013   Hx of seasonal allergies    Hyperlipidemia    takes Niacin daily and Lipitor daily   Hypertension    takes Amlodipine and Quinapril daily   Insomnia    takes Trazodone nightly   Joint pain    Leg pain 05-04-10   with walking   Memory loss    short and long term   Nocturia    Peripheral edema    takes Furosemide prn   Peripheral vascular disease (HCC)    s/p AAA stent   Prostate cancer ()    Urinary urgency    Wears glasses     Past Surgical History:  Procedure Laterality Date   ABDOMINAL AORTIC ANEURYSM REPAIR  11/2009   Stent graft repair   BACK SURGERY     thoracic spine   CARDIAC CATHETERIZATION     15 yrs ago   CHOLECYSTECTOMY     COLONOSCOPY     FOOT SURGERY     Multiple surgeries on right foot   GANGLION CYST EXCISION Right 06/07/2012   Procedure: DISTAL POLE EXCISION RIGHT WRIST;  Surgeon: Wynonia Sours, MD;  Location: [place];  Service: Orthopedics;  Laterality: Right;   GOLD SEED IMPLANT N/A 09/08/2019   Procedure: GOLD SEED IMPLANT;  Surgeon: Franchot Gallo, MD;  Location: [facility];  Service: Urology;  Laterality: N/A;  30 MINS   SHOULDER SURGERY     Right shoulder   SPACE OAR INSTILLATION N/A 09/08/2019   Procedure: SPACE OAR INSTILLATION;  Surgeon: Franchot Gallo, MD;  Location: [facility];  Service:  Urology;  Laterality: N/A;   SPINAL CORD STIMULATOR IMPLANT  2009   NOVI [id number]  MRI UNSAFE   SPINAL FUSION  04/04/2011   Procedure: FUSION POSTERIOR SPINAL MULTILEVEL/SCOLIOSIS;  Surgeon: Gunnar Bulla, MD;  Location: [place];  Service: Orthopedics;  Laterality: N/A;  repair thoracic and lumbar  pseudoarthrosis, revise spinal cord stimulator, extend thoracic fusion, iliac crest bone graft   SPINE SURGERY  12/2009   Spinal Fusion by Dr. Marcial Pacas   TESTICLE SURGERY     right testicle   TONSILLECTOMY     TURP VAPORIZATION     VASCULAR SURGERY     AAA stent repair    Family History  Problem Relation Age of Onset   Melanoma Mother    Diabetes Father    Heart attack Father    Prostate cancer Father    Arthritis Sister        rheumatoid   Breast cancer Sister    Breast cancer Maternal Grandmother    Anesthesia problems Neg Hx    Hypotension Neg Hx    Malignant hyperthermia Neg Hx    Pseudochol deficiency Neg Hx    Pancreatic cancer Neg Hx    Colon cancer Neg Hx     Social History   Socioeconomic History   Marital status: Single    Spouse name: Not on file   Number of children: Not on file   Years of education: Not on file   Highest education level: Not on file  Occupational History   Occupation: Retired  Tobacco Use   Smoking status: Former    Packs/day: 1.00    Years: 50.00    Pack years: 50.00    Types: Cigarettes    Quit date: 10/08/2013    Years since quitting: 6.9   Smokeless tobacco: Never   Tobacco comments:    states smokes "little cigars" - former  Vaping Use   Vaping Use: Never used  Substance and Sexual Activity   Alcohol use: No    Alcohol/week: 0.0 standard drinks   Drug use: No   Sexual activity: Not Currently  Other Topics Concern   Not on file  Social History Narrative   Not on file   Social Determinants of Health   Financial Resource Strain: Not on file  Food Insecurity: Not on file  Transportation Needs: Not on file  Physical Activity:  Not on file  Stress: Not on file  Social Connections: Not on file  Intimate Partner Violence: Not on file    No Known Allergies  Current Outpatient Medications  Medication Sig Dispense Refill   acetaminophen (TYLENOL) 500 MG tablet  Take 500-1,000 mg by mouth every 6 (six) hours as needed (for pain.).     amLODipine (NORVASC) 10 MG tablet Take 10 mg by mouth daily.      Ascorbic Acid (VITAMIN C) 1000 MG tablet Take 1,000 mg by mouth daily.      aspirin 325 MG tablet Take 162.5 mg by mouth daily.      atorvastatin (LIPITOR) 40 MG tablet Take 40 mg by mouth daily.     b complex vitamins tablet Take 1 tablet by mouth daily.      buPROPion (WELLBUTRIN XL) 150 MG 24 hr tablet Take 450 mg by mouth daily.     Calcium Carb-Cholecalciferol (CALCIUM 600+D3 PO) Take 2 tablets by mouth every evening.     calcium carbonate (SUPER CALCIUM) 1500 (600 Ca) MG TABS tablet 2 tablets     cephALEXin (KEFLEX) 500 MG capsule Take 1 capsule (500 mg total) by mouth 3 (three) times daily. 30 capsule 0   chlorpheniramine (ALLERGY) 4 MG tablet 1 tablet as needed     cholecalciferol (VITAMIN D) 1000 UNITS tablet Take 1,000 Units by mouth daily.      clonazePAM (KLONOPIN) 1 MG tablet Take 1 mg by mouth See admin instructions. Take 1 tablet (1 mg) by mouth daily if needed for anxiety & take 1 tablet (1 mg) by mouth scheduled at bedtime.     diclofenac Sodium (VOLTAREN) 1 % GEL See admin instructions.     diphenhydrAMINE (BENADRYL) 25 mg capsule Take 25 mg by mouth every 6 (six) hours as needed for itching or allergies.      famotidine (PEPCID) 20 MG tablet Take 20 mg by mouth daily.     fentaNYL (DURAGESIC) 75 MCG/HR Place 1 patch onto the skin every other day.     furosemide (LASIX) 40 MG tablet Take 80 mg by mouth daily as needed (fluid retention.).      gabapentin (NEURONTIN) 300 MG capsule Take 600 mg by mouth at bedtime.     ibuprofen (ADVIL,MOTRIN) 200 MG tablet Take 200-400 mg by mouth every 8 (eight) hours as  needed (pain.). For pain     magnesium oxide (MAG-OX) 400 MG tablet Take 800 mg by mouth every evening.     methocarbamol (ROBAXIN) 750 MG tablet Take 750 mg by mouth every 8 (eight) hours.     Multiple Vitamin (MULTI VITAMIN) TABS 1 tablet     Multiple Vitamin (MULTIVITAMIN WITH MINERALS) TABS tablet Take 1 tablet by mouth daily.     nystatin (MYCOSTATIN/NYSTOP) powder Apply 1 application topically 2 (two) times daily as needed (skin irritation/rash.).      Omega-3 Fatty Acids (FISH OIL) 1200 MG CAPS Take 2,400 mg by mouth every evening.     polyethylene glycol powder (MIRALAX) 17 GM/SCOOP powder See admin instructions.     Potassium 99 MG TABS Take 198 mg by mouth every evening.      pregabalin (LYRICA) 150 MG capsule Take 300 mg by mouth at bedtime.     pseudoephedrine (SUDAFED) 30 MG tablet Take 60 mg by mouth every 4 (four) hours as needed for congestion.     QUEtiapine (SEROQUEL) 100 MG tablet Take 1 tablet (100 mg total) by mouth at bedtime.     quinapril (ACCUPRIL) 40 MG tablet Take 80 mg by mouth daily.     RESTASIS 0.05 % ophthalmic emulsion Place 1 drop into both eyes 2 (two) times daily as needed (dry/irritated eyes.).     simethicone (MYLICON) 725 MG chewable  tablet 1 tablet     sulfamethoxazole-trimethoprim (BACTRIM DS) 800-160 MG tablet Take 1 tablet by mouth 2 (two) times daily. 14 tablet 0   tamsulosin (FLOMAX) 0.4 MG CAPS capsule Take 0.4 mg by mouth daily.     terbinafine (LAMISIL) 250 MG tablet Take 250 mg by mouth daily.     traZODone (DESYREL) 100 MG tablet Take 1 tablet (100 mg total) by mouth at bedtime.     Venlafaxine HCl 75 MG TB24 Take 225 mg by mouth daily.     vitamin E 180 MG (400 UNITS) capsule Take 400 Units by mouth daily.     No current facility-administered medications for this visit.    REVIEW OF SYSTEMS:  [X]  denotes positive finding, [ ]  denotes negative finding Cardiac  Comments:  Chest pain or chest pressure:    Shortness of breath upon exertion:     Short of breath when lying flat:    Irregular heart rhythm:        Vascular    Pain in calf, thigh, or hip brought on by ambulation: x   Pain in feet at night that wakes you up from your sleep:     Blood clot in your veins:    Leg swelling:         Pulmonary    Oxygen at home:    Productive cough:     Wheezing:         Neurologic    Sudden weakness in arms or legs:     Sudden numbness in arms or legs:     Sudden onset of difficulty speaking or slurred speech:    Temporary loss of vision in one eye:     Problems with dizziness:         Gastrointestinal    Blood in stool:     Vomited blood:         Genitourinary    Burning when urinating:     Blood in urine:        Psychiatric    Major depression:         Hematologic    Bleeding problems:    Problems with blood clotting too easily:        Skin    Rashes or ulcers:        Constitutional    Fever or chills:      PHYSICAL EXAM Vitals:   09/07/20 1342  BP: (!) 149/82  Pulse: 76  Resp: 20  Temp: 97.9 F (36.6 C)  SpO2: 93%  Weight: 273 lb (123.8 kg)    Constitutional: chronically ill appearing. no distress. Appears well nourished.  Neurologic: CN intact. no focal findings. no sensory loss. Psychiatric:  Mood and affect symmetric and appropriate. Eyes:  No icterus. No conjunctival pallor. Ears, nose, throat:  mucous membranes moist. Midline trachea.  Cardiac:  regular rate and rhythm.  Respiratory:  unlabored. Abdominal: obese. soft, non-tender, non-distended.  Peripheral vascular: no palpable pedal pulses. Unable to palpate femoral pulses 2/2 pannus. Extremity: no edema. no cyanosis. no pallor.  Skin: no gangrene. no ulceration.  Lymphatic: no Stemmer's sign. no palpable lymphadenopathy.  PERTINENT LABORATORY AND RADIOLOGIC DATA  Most recent CBC CBC Latest Ref Rng & Units 09/04/2019 06/04/2016 06/02/2016  WBC 4.0 - 10.5 K/uL 6.9 9.1 8.7  Hemoglobin 13.0 - 17.0 g/dL 16.0 15.1 15.5  Hematocrit 39.0 -  52.0 % 50.6 45.9 46.2  Platelets 150 - 400 K/uL 142(L) 116(L) 114(L)     Most recent  CMP CMP Latest Ref Rng & Units 09/04/2019 06/04/2016 06/02/2016  Glucose 70 - 99 mg/dL 87 94 83  BUN 8 - 23 mg/dL 24(H) 26(H) 22(H)  Creatinine 0.61 - 1.24 mg/dL 1.00 1.03 1.07  Sodium 135 - 145 mmol/L 139 139 138  Potassium 3.5 - 5.1 mmol/L 4.5 3.9 3.1(L)  Chloride 98 - 111 mmol/L 106 104 104  CO2 22 - 32 mmol/L 24 27 23   Calcium 8.9 - 10.3 mg/dL 8.9 9.2 9.1  Total Protein 6.5 - 8.1 g/dL - 6.5 -  Total Bilirubin 0.3 - 1.2 mg/dL - 0.8 -  Alkaline Phos 38 - 126 U/L - 67 -  AST 15 - 41 U/L - 31 -  ALT 17 - 63 U/L - 28 -   LOWER EXTREMITY DOPPLER STUDY   Patient Name:  Michael Lutz  Date of Exam:   08/20/2020  Medical Rec #: [id number]       Accession #:    [id number]  Date of Birth: 03-Aug-1945       Patient Gender: M  Patient Age:   60Y  Exam Location:  [facility]  Procedure:      VAS [place] ABI WITH/WO TBI  Referring Phys: [id number] Inez Catalina    ---------------------------------------------------------------------------  -----     Indications: Claudication.   High Risk Factors: Hypertension, hyperlipidemia.     Comparison Study: prior 05/21/19   Performing Technologist: Archie Patten RVS      Examination Guidelines: A complete evaluation includes at minimum, Doppler  waveform signals and systolic blood pressure reading at the level of  bilateral  brachial, anterior tibial, and posterior tibial arteries, when vessel  segments  are accessible. Bilateral testing is considered an integral part of a  complete  examination. Photoelectric Plethysmograph (PPG) waveforms and toe systolic  pressure readings are included as required and additional duplex testing  as  needed. Limited examinations for reoccurring indications may be performed  as  noted.      ABI Findings:  +---------+------------------+-----+----------+----------+  Right    Rt Pressure (mmHg)IndexWaveform   Comment     +---------+------------------+-----+----------+----------+  Brachial 166                    triphasic             +---------+------------------+-----+----------+----------+  PTA      112               0.67 biphasic              +---------+------------------+-----+----------+----------+  DP       93                0.56 monophasic            +---------+------------------+-----+----------+----------+  Great Toe128               0.77           unreliable  +---------+------------------+-----+----------+----------+   +---------+------------------+-----+---------+-------+  Left     Lt Pressure (mmHg)IndexWaveform Comment  +---------+------------------+-----+---------+-------+  Brachial 156                    triphasic         +---------+------------------+-----+---------+-------+  PTA      195               1.17 triphasic         +---------+------------------+-----+---------+-------+  DP       141  0.85 triphasic         +---------+------------------+-----+---------+-------+  Great Toe165               0.99 Normal            +---------+------------------+-----+---------+-------+   +-------+-----------+-----------+------------+------------+  ABI/TBIToday's ABIToday's TBIPrevious ABIPrevious TBI  +-------+-----------+-----------+------------+------------+  Right  0.67       0.77                                 +-------+-----------+-----------+------------+------------+  Left   1.17       0.99                                 +-------+-----------+-----------+------------+------------+          Summary:  Right: Resting right ankle-brachial index indicates moderate right lower  extremity arterial disease. TBIs are unreliable.   Left: Resting left ankle-brachial index is within normal range. No  evidence of significant left lower extremity arterial disease. The left  toe-brachial index is  normal.       *See table(s) above for measurements and observations.       Electronically signed by Jamelle Haring on 08/20/2020 at 9:18:15 PM.   [name]. Stanford Breed, MD [facility] Phone Number: [phone] 09/05/2020 11:13 AM

## 2020-09-07 ENCOUNTER — Other Ambulatory Visit: Payer: Self-pay

## 2020-09-07 ENCOUNTER — Ambulatory Visit (INDEPENDENT_AMBULATORY_CARE_PROVIDER_SITE_OTHER): Payer: PPO | Admitting: Vascular Surgery

## 2020-09-07 ENCOUNTER — Encounter: Payer: Self-pay | Admitting: Vascular Surgery

## 2020-09-07 VITALS — BP 149/82 | HR 76 | Temp 97.9°F | Resp 20 | Wt 273.0 lb

## 2020-09-07 DIAGNOSIS — I739 Peripheral vascular disease, unspecified: Secondary | ICD-10-CM

## 2020-09-09 DIAGNOSIS — F3111 Bipolar disorder, current episode manic without psychotic features, mild: Secondary | ICD-10-CM | POA: Diagnosis not present

## 2020-09-09 DIAGNOSIS — M1712 Unilateral primary osteoarthritis, left knee: Secondary | ICD-10-CM | POA: Diagnosis not present

## 2020-09-15 ENCOUNTER — Encounter (HOSPITAL_COMMUNITY)
Admission: RE | Disposition: A | Discharge status: Home / Self Care | Payer: Self-pay | Source: Home / Self Care | Attending: Vascular Surgery

## 2020-09-15 ENCOUNTER — Ambulatory Visit (HOSPITAL_COMMUNITY)
Admission: RE | Admit: 2020-09-15 | Discharge: 2020-09-15 | Disposition: A | Discharge status: Home / Self Care | Payer: PPO | Attending: Vascular Surgery | Admitting: Vascular Surgery

## 2020-09-15 ENCOUNTER — Other Ambulatory Visit: Payer: Self-pay

## 2020-09-15 DIAGNOSIS — I10 Essential (primary) hypertension: Secondary | ICD-10-CM | POA: Diagnosis not present

## 2020-09-15 DIAGNOSIS — E785 Hyperlipidemia, unspecified: Secondary | ICD-10-CM | POA: Insufficient documentation

## 2020-09-15 DIAGNOSIS — Z87891 Personal history of nicotine dependence: Secondary | ICD-10-CM | POA: Diagnosis not present

## 2020-09-15 DIAGNOSIS — Z79899 Other long term (current) drug therapy: Secondary | ICD-10-CM | POA: Diagnosis not present

## 2020-09-15 DIAGNOSIS — I70211 Atherosclerosis of native arteries of extremities with intermittent claudication, right leg: Secondary | ICD-10-CM | POA: Diagnosis not present

## 2020-09-15 DIAGNOSIS — Z7982 Long term (current) use of aspirin: Secondary | ICD-10-CM | POA: Insufficient documentation

## 2020-09-15 HISTORY — PX: LOWER EXTREMITY ANGIOGRAPHY: CATH118251

## 2020-09-15 LAB — POCT I-STAT, CHEM 8
BUN: 21 mg/dL (ref 8–23)
Calcium, Ion: 1.13 mmol/L — ABNORMAL LOW (ref 1.15–1.40)
Chloride: 105 mmol/L (ref 98–111)
Creatinine, Ser: 1.1 mg/dL (ref 0.61–1.24)
Glucose, Bld: 90 mg/dL (ref 70–99)
HCT: 45 % (ref 39.0–52.0)
Hemoglobin: 15.3 g/dL (ref 13.0–17.0)
Potassium: 4.1 mmol/L (ref 3.5–5.1)
Sodium: 141 mmol/L (ref 135–145)
TCO2: 25 mmol/L (ref 22–32)

## 2020-09-15 SURGERY — LOWER EXTREMITY ANGIOGRAPHY
Anesthesia: LOCAL | Laterality: Right

## 2020-09-15 MED ORDER — FENTANYL CITRATE (PF) 100 MCG/2ML IJ SOLN
INTRAMUSCULAR | Status: DC | PRN
  Administered 2020-09-15 (×2): 50 ug via INTRAVENOUS
  Administered 2020-09-15: 100 ug via INTRAVENOUS

## 2020-09-15 MED ORDER — MIDAZOLAM HCL 2 MG/2ML IJ SOLN
INTRAMUSCULAR | Status: AC
  Filled 2020-09-15: qty 2

## 2020-09-15 MED ORDER — HEPARIN SODIUM (PORCINE) 1000 UNIT/ML IJ SOLN
INTRAMUSCULAR | Status: DC | PRN
  Administered 2020-09-15: 5000 [IU] via INTRAVENOUS

## 2020-09-15 MED ORDER — VERAPAMIL HCL 2.5 MG/ML IV SOLN
INTRAVENOUS | Status: AC
  Filled 2020-09-15: qty 2

## 2020-09-15 MED ORDER — HYDRALAZINE HCL 20 MG/ML IJ SOLN: 5.0000 mg | INTRAMUSCULAR | Status: DC | PRN

## 2020-09-15 MED ORDER — ATORVASTATIN CALCIUM 40 MG PO TABS
40.0000 mg | ORAL_TABLET | Freq: Every day | ORAL | Status: DC
  Filled 2020-09-15: qty 1

## 2020-09-15 MED ORDER — VERAPAMIL HCL 2.5 MG/ML IV SOLN
INTRAVENOUS | Status: DC | PRN
  Administered 2020-09-15: 3 mL via INTRA_ARTERIAL

## 2020-09-15 MED ORDER — LIDOCAINE HCL (PF) 1 % IJ SOLN
INTRAMUSCULAR | Status: DC | PRN
  Administered 2020-09-15: 3 mL

## 2020-09-15 MED ORDER — ONDANSETRON HCL 4 MG/2ML IJ SOLN: 4.0000 mg | Freq: Four times a day (QID) | INTRAMUSCULAR | Status: DC | PRN

## 2020-09-15 MED ORDER — HEPARIN SODIUM (PORCINE) 1000 UNIT/ML IJ SOLN
INTRAMUSCULAR | Status: AC
  Filled 2020-09-15: qty 1

## 2020-09-15 MED ORDER — SODIUM CHLORIDE 0.9 % IV SOLN: INTRAVENOUS | Status: DC

## 2020-09-15 MED ORDER — ASPIRIN EC 81 MG PO TBEC: 81.0000 mg | DELAYED_RELEASE_TABLET | Freq: Every day | ORAL | Status: DC

## 2020-09-15 MED ORDER — HEPARIN (PORCINE) IN NACL 1000-0.9 UT/500ML-% IV SOLN
INTRAVENOUS | Status: AC
  Filled 2020-09-15: qty 500

## 2020-09-15 MED ORDER — FENTANYL CITRATE (PF) 100 MCG/2ML IJ SOLN
INTRAMUSCULAR | Status: AC
  Filled 2020-09-15: qty 2

## 2020-09-15 MED ORDER — LIDOCAINE HCL (PF) 1 % IJ SOLN
INTRAMUSCULAR | Status: AC
  Filled 2020-09-15: qty 30

## 2020-09-15 MED ORDER — SODIUM CHLORIDE 0.9% FLUSH: 3.0000 mL | Freq: Two times a day (BID) | INTRAVENOUS | Status: DC

## 2020-09-15 MED ORDER — ACETAMINOPHEN 325 MG PO TABS: 650.0000 mg | ORAL_TABLET | ORAL | Status: DC | PRN

## 2020-09-15 MED ORDER — SODIUM CHLORIDE 0.9% FLUSH: 3.0000 mL | INTRAVENOUS | Status: DC | PRN

## 2020-09-15 MED ORDER — SODIUM CHLORIDE 0.9 % IV SOLN: 250.0000 mL | INTRAVENOUS | Status: DC | PRN

## 2020-09-15 MED ORDER — LABETALOL HCL 5 MG/ML IV SOLN
10.0000 mg | INTRAVENOUS | Status: DC | PRN
Start: 2020-09-15 — End: 2020-09-15

## 2020-09-15 MED ORDER — SODIUM CHLORIDE 0.9 % WEIGHT BASED INFUSION: 1.0000 mL/kg/h | INTRAVENOUS | Status: DC

## 2020-09-15 MED ORDER — MIDAZOLAM HCL 2 MG/2ML IJ SOLN
INTRAMUSCULAR | Status: DC | PRN
  Administered 2020-09-15: 2 mg via INTRAVENOUS

## 2020-09-15 MED ORDER — HEPARIN (PORCINE) IN NACL 1000-0.9 UT/500ML-% IV SOLN
INTRAVENOUS | Status: DC | PRN
  Administered 2020-09-15 (×2): 500 mL

## 2020-09-15 SURGICAL SUPPLY — 11 items
CATH ANGIO 5F PIGTAIL 100CM (CATHETERS) ×3 IMPLANT
CATH INFINITI VERT 5FR 125CM (CATHETERS) ×3 IMPLANT
DEVICE RAD TR BAND REGULAR (VASCULAR PRODUCTS) ×3 IMPLANT
GUIDEWIRE ANGLED .035X260CM (WIRE) ×3 IMPLANT
KIT PV (KITS) ×3 IMPLANT
SHEATH GLIDE SLENDER 4/5FR (SHEATH) ×3 IMPLANT
STOPCOCK MORSE 400PSI 3WAY (MISCELLANEOUS) ×3 IMPLANT
SYR MEDRAD MARK V 150ML (SYRINGE) ×3 IMPLANT
TRANSDUCER W/STOPCOCK (MISCELLANEOUS) ×3 IMPLANT
TRAY PV CATH (CUSTOM PROCEDURE TRAY) ×3 IMPLANT
TUBING CONTRAST HIGH PRESS 48 (TUBING) ×3 IMPLANT

## 2020-09-15 NOTE — Op Note (Signed)
DATE OF SERVICE: 09/15/2020  PATIENT:  Michael Lutz  75 y.o. male  PRE-OPERATIVE DIAGNOSIS:  Atherosclerosis of native arteries of right lower extremity causing intermittent claudication  POST-OPERATIVE DIAGNOSIS:  Same  PROCEDURE:   1) US guided left radial artery access 2) right lower extremity angiogram with third order cannulation (41mL total contrast) 3) Conscious sedation (35 minutes)  SURGEON:  Surgeon(s) and Role:    * Cherre Robins, MD - Primary  ASSISTANT: none  ANESTHESIA:   local and IV sedation  EBL: min  BLOOD ADMINISTERED:none  DRAINS: none   LOCAL MEDICATIONS USED:  LIDOCAINE   SPECIMEN:  none  COUNTS: confirmed correct.  TOURNIQUET:  none  PATIENT DISPOSITION:  PACU - hemodynamically stable.   Delay start of Pharmacological VTE agent (>24hrs) due to surgical blood loss or risk of bleeding: no  INDICATION FOR PROCEDURE: GENERAL WEARING is a 75 y.o. male with intermittent claudication of the right lower extremity. After careful discussion of risks, benefits, and alternatives the patient was offered angiography. We specifically discussed access site complications. The patient understood and wished to proceed.  OPERATIVE FINDINGS:  Right limb of EVAR widely patent Tortuous, but patent right iliac arteries  Ectatic right common femoral artery Right profunda and superficial femoral artery patent Atherosclerotic disease at Hunter's canal on the right Popliteal artery occluded behind the knee Imaging below the knee limited Right posterior tibial artery reconstitutes mid calf and is dominant flow to the foot Right peroneal artery reconstitutes mid calf and arborizes to the AT/DP about the ankle  DESCRIPTION OF PROCEDURE: After identification of the patient in the pre-operative holding area, the patient was transferred to the operating room. The patient was positioned supine on the operating room table. Anesthesia was induced. The groins was prepped and  draped in standard fashion. A surgical pause was performed confirming correct patient, procedure, and operative location.  The left wrist was anesthetized with subcutaneous injection of 1% lidocaine. Using ultrasound guidance, the left radial artery was accessed with micropuncture technique. The patient was systemically heparinized. A 4/57F slender sheath was introduced into the radial artery. Verapamil and heparin solution was flushed into the radial artery.   An [place] was navigated into the ascending aorta. A pigtail catheter was used to direct the wire into the descending thoracic aorta. The right limb of the previously placed EVAR was selected. A 125cm vertebral catheter was advanced into the right limb of the bypass.   Selective angiography was performed - see above for details.   The catheter and sheath were removed. A transradial band was used to close the arteriotomy. Hemostasis was excellent upon completion.  Conscious sedation was administered with the use of IV fentanyl and midazolam under continuous physician and nurse monitoring.  Heart rate, blood pressure, and oxygen saturation were continuously monitored.  Total sedation time was 35 minutes  Upon completion of the case instrument and sharps counts were confirmed correct. The patient was transferred to the PACU in good condition. I was present for all portions of the procedure.  PLAN: continue medical management for PAD. ASA / Statin indefinitely. Will discuss with patient in next 2-3 weeks. Will strongly recommend for medical management only given his disadvantaged runoff. Revascularization would involve a above knee popliteal artery bypass to posterior tibial artery bypass in mid calf.  [name]. Stanford Breed, MD [facility] Phone Number: [phone] 09/15/2020 10:42 AM

## 2020-09-15 NOTE — Interval H&P Note (Signed)
History and Physical Interval Note:  09/15/2020 10:01 AM  Michael Lutz  has presented today for surgery, with the diagnosis of pad.  The various methods of treatment have been discussed with the patient and family. After consideration of risks, benefits and other options for treatment, the patient has consented to  Procedure(s): ABDOMINAL AORTOGRAM W/LOWER EXTREMITY (N/A) as a surgical intervention.  The patient's history has been reviewed, patient examined, no change in status, stable for surgery.  I have reviewed the patient's chart and labs.  Questions were answered to the patient's satisfaction.     Cherre Robins

## 2020-09-16 ENCOUNTER — Encounter (HOSPITAL_COMMUNITY): Payer: Self-pay | Admitting: Vascular Surgery

## 2020-09-16 LAB — POCT I-STAT, CHEM 8
BUN: 32 mg/dL — ABNORMAL HIGH (ref 8–23)
Calcium, Ion: 1.11 mmol/L — ABNORMAL LOW (ref 1.15–1.40)
Chloride: 105 mmol/L (ref 98–111)
Creatinine, Ser: 1.2 mg/dL (ref 0.61–1.24)
Glucose, Bld: 88 mg/dL (ref 70–99)
HCT: 45 % (ref 39.0–52.0)
Hemoglobin: 15.3 g/dL (ref 13.0–17.0)
Potassium: 8.4 mmol/L (ref 3.5–5.1)
Sodium: 137 mmol/L (ref 135–145)
TCO2: 30 mmol/L (ref 22–32)

## 2020-09-20 DIAGNOSIS — N401 Enlarged prostate with lower urinary tract symptoms: Secondary | ICD-10-CM | POA: Diagnosis not present

## 2020-09-20 DIAGNOSIS — Z8546 Personal history of malignant neoplasm of prostate: Secondary | ICD-10-CM | POA: Diagnosis not present

## 2020-09-20 DIAGNOSIS — C61 Malignant neoplasm of prostate: Secondary | ICD-10-CM | POA: Diagnosis not present

## 2020-09-20 DIAGNOSIS — N3942 Incontinence without sensory awareness: Secondary | ICD-10-CM | POA: Diagnosis not present

## 2020-09-20 DIAGNOSIS — R350 Frequency of micturition: Secondary | ICD-10-CM | POA: Diagnosis not present

## 2020-09-27 ENCOUNTER — Other Ambulatory Visit: Payer: Self-pay

## 2020-09-27 DIAGNOSIS — I739 Peripheral vascular disease, unspecified: Secondary | ICD-10-CM

## 2020-10-04 ENCOUNTER — Ambulatory Visit (HOSPITAL_COMMUNITY)
Admission: RE | Admit: 2020-10-04 | Discharge: 2020-10-04 | Disposition: A | Discharge status: Home / Self Care | Payer: PPO | Source: Ambulatory Visit | Attending: Vascular Surgery | Admitting: Vascular Surgery

## 2020-10-04 ENCOUNTER — Other Ambulatory Visit: Payer: Self-pay

## 2020-10-04 DIAGNOSIS — I739 Peripheral vascular disease, unspecified: Secondary | ICD-10-CM | POA: Diagnosis not present

## 2020-10-05 ENCOUNTER — Encounter: Payer: Self-pay | Admitting: Vascular Surgery

## 2020-10-05 ENCOUNTER — Ambulatory Visit (INDEPENDENT_AMBULATORY_CARE_PROVIDER_SITE_OTHER): Payer: PPO | Admitting: Vascular Surgery

## 2020-10-05 ENCOUNTER — Other Ambulatory Visit: Payer: Self-pay

## 2020-10-05 ENCOUNTER — Emergency Department (HOSPITAL_COMMUNITY)
Admission: EM | Admit: 2020-10-05 | Discharge: 2020-10-05 | Disposition: A | Discharge status: Home / Self Care | Payer: PPO | Attending: Physician Assistant | Admitting: Physician Assistant

## 2020-10-05 ENCOUNTER — Emergency Department (HOSPITAL_COMMUNITY): Payer: PPO

## 2020-10-05 VITALS — BP 102/65 | HR 74 | Temp 97.8°F | Resp 20 | Ht 68.0 in | Wt 272.0 lb

## 2020-10-05 DIAGNOSIS — I951 Orthostatic hypotension: Secondary | ICD-10-CM | POA: Insufficient documentation

## 2020-10-05 DIAGNOSIS — I739 Peripheral vascular disease, unspecified: Secondary | ICD-10-CM

## 2020-10-05 DIAGNOSIS — R42 Dizziness and giddiness: Secondary | ICD-10-CM | POA: Diagnosis not present

## 2020-10-05 DIAGNOSIS — R0902 Hypoxemia: Secondary | ICD-10-CM | POA: Diagnosis not present

## 2020-10-05 DIAGNOSIS — Z5321 Procedure and treatment not carried out due to patient leaving prior to being seen by health care provider: Secondary | ICD-10-CM | POA: Diagnosis not present

## 2020-10-05 LAB — CBC WITH DIFFERENTIAL/PLATELET
Abs Immature Granulocytes: 0.02 10*3/uL (ref 0.00–0.07)
Basophils Absolute: 0 10*3/uL (ref 0.0–0.1)
Basophils Relative: 0 %
Eosinophils Absolute: 0.1 10*3/uL (ref 0.0–0.5)
Eosinophils Relative: 1 %
HCT: 41.4 % (ref 39.0–52.0)
Hemoglobin: 13.2 g/dL (ref 13.0–17.0)
Immature Granulocytes: 0 %
Lymphocytes Relative: 17 %
Lymphs Abs: 1.1 10*3/uL (ref 0.7–4.0)
MCH: 28.6 pg (ref 26.0–34.0)
MCHC: 31.9 g/dL (ref 30.0–36.0)
MCV: 89.6 fL (ref 80.0–100.0)
Monocytes Absolute: 0.7 10*3/uL (ref 0.1–1.0)
Monocytes Relative: 10 %
Neutro Abs: 4.9 10*3/uL (ref 1.7–7.7)
Neutrophils Relative %: 72 %
Platelets: 127 10*3/uL — ABNORMAL LOW (ref 150–400)
RBC: 4.62 MIL/uL (ref 4.22–5.81)
RDW: 16.3 % — ABNORMAL HIGH (ref 11.5–15.5)
WBC: 6.8 10*3/uL (ref 4.0–10.5)
nRBC: 0 % (ref 0.0–0.2)

## 2020-10-05 LAB — BASIC METABOLIC PANEL
Anion gap: 7 (ref 5–15)
BUN: 31 mg/dL — ABNORMAL HIGH (ref 8–23)
CO2: 25 mmol/L (ref 22–32)
Calcium: 8.6 mg/dL — ABNORMAL LOW (ref 8.9–10.3)
Chloride: 104 mmol/L (ref 98–111)
Creatinine, Ser: 1.69 mg/dL — ABNORMAL HIGH (ref 0.61–1.24)
GFR, Estimated: 42 mL/min — ABNORMAL LOW (ref >60)
Glucose, Bld: 81 mg/dL (ref 70–99)
Potassium: 4.9 mmol/L (ref 3.5–5.1)
Sodium: 136 mmol/L (ref 135–145)

## 2020-10-05 NOTE — ED Provider Notes (Signed)
Emergency Medicine Provider Triage Evaluation Note  RAISHAUN BIAMONTE , a 75 y.o. male  was evaluated in triage.  Pt complains of feeling dizzy.  He was an appointment for his vascular and vein specialist this morning and became dizzy when standing up.  He had positive orthostatic vitals in the office and hypoxia.  He denies any history of oxygen requirement in the past.  He states he felt like he was dreaming or hallucinating while at the appointment this morning.  Review of Systems  Positive: Dizziness Negative: Fever, chills, chest pain, shortness of breath, nausea, vomiting, back pain  Physical Exam  BP 123/77   Pulse 67   Temp 98.1 F (36.7 C) (Oral)   Resp 18   SpO2 94%  Gen:   Awake, no distress   Resp:  Normal effort. Hypoxic to 85% on room air, placed on 2 L nasal cannula and oxygen improved to 94% MSK:   Moves extremities without difficulty  Other:  No leg swelling  Medical Decision Making  Medically screening exam initiated at 5:31 PM.  Appropriate orders placed.  Isreal Haubner Atwood was informed that the remainder of the evaluation will be completed by another provider, this initial triage assessment does not replace that evaluation, and the importance of remaining in the ED until their evaluation is complete.  Basic labs and chest x-ray ordered.   Portions of this note were generated with [employer]obby[employer]. Dictation errors may occur despite best attempts at proofreading.    Barrie Folk, PA-C 10/05/20 1736    Daleen Bo, MD 10/05/20 2242

## 2020-10-05 NOTE — Progress Notes (Signed)
[facility] [place]  ASSESSMENT / PLAN: Michael Lutz is a 75 y.o. 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  Patient counseled patients with asymptomatic peripheral arterial disease or claudication have a 1-2% risk of developing chronic limb threatening ischemia, but a 15-30% risk of mortality in the next 5 years. Intervention should only be considered for medically optimized patients with disabling symptoms. .  Recommend the following which can slow the progression of atherosclerosis and reduce the risk of major adverse cardiac / limb events:  Complete cessation from all tobacco products. Blood glucose control with goal A1c < 7%. Blood pressure control with goal blood pressure < 140/90 mmHg. Lipid reduction therapy with goal LDL-C <100 mg/dL (<70 if symptomatic from PAD).  Aspirin '81mg'$  PO QD.  Atorvastatin 40-'80mg'$  PO QD (or other "high intensity" statin therapy).  Right lower extremity angiogram showed popliteal occlusion with reconstitution of the mid calf PT artery.  I counseled him extensively that operative intervention in this unwell man is high risk specially when weighed against the benign nature of intermittent claudication.  He is understanding.  Separately, the patient appears systemically unwell in clinic today.  He is diaphoretic, hypoxic with malaise.  I counseled him to present to the nearest ER for evaluation.  CHIEF COMPLAINT: right calf pain  HISTORY OF PRESENT ILLNESS: Michael Lutz is a 75 y.o. male who returns to clinic for evaluation of disabling right lower extremity pain.  The patient reports he cannot walk 30 feet without severe cramping in his calf, requiring him to stop.  He does not have symptoms of ischemic rest pain.  He has no ulcers about his right foot.  He is status post endovascular aortic reconstruction in 2011.  He had a CTA performed 02/20/2020  for concern of an enlarging endoleak.  This was negative for endoleak.  10/05/20: Patient returns for discussion after angiogram.  I reviewed the angiogram in detail personally and with him.  We had a lengthy discussion about the natural history of intermittent claudication.  I explained that he is a low annualized risk of amputation, and the low risk of disease progression.  He is understanding.  The patient is diaphoretic and feels generally unwell.  He describes malaise over the past day.  Pulse oximetry reveals an initial oxygen saturation of 85% which improved to 90% with deep breathing.  Past Medical History:  Diagnosis Date   AAA (abdominal aortic aneurysm) ([place])    ADD (attention deficit disorder)    Anxiety    takes Clonazepam daily   Arthritis    left knee   Bipolar 1 disorder (HCC)    takes Seroquel nightly   Bipolar affective disorder ([place])    in remission on meds   Blood transfusion    several times   CHF (congestive heart failure) (HCC)    Chronic back pain    [facility]   Chronic back pain    scoliosis   Chronic low back pain 07/31/2013   Colon polyps    Dr Cristina Gong does a colonoscopy every 5 years   Constipation    takes Colace prn   Depression    takes Effexor daily   Dysrhythmia    palpitations- sees dr Irish Lack   Enlarged prostate    slightly   Full dentures    GERD (gastroesophageal reflux disease)    takes Zantac daily   Heart murmur  arrythmia   History of shingles    Hx of scabies Mid Feb 2013   Hx of seasonal allergies    Hyperlipidemia    takes Niacin daily and Lipitor daily   Hypertension    takes Amlodipine and Quinapril daily   Insomnia    takes Trazodone nightly   Joint pain    Leg pain 05-04-10   with walking   Memory loss    short and long term   Nocturia    Peripheral edema    takes Furosemide prn   Peripheral vascular disease (HCC)    s/p AAA stent   Prostate cancer ([place])    Urinary urgency    Wears  glasses     Past Surgical History:  Procedure Laterality Date   ABDOMINAL AORTIC ANEURYSM REPAIR  11/2009   Stent graft repair   BACK SURGERY     thoracic spine   CARDIAC CATHETERIZATION     15 yrs ago   CHOLECYSTECTOMY     COLONOSCOPY     FOOT SURGERY     Multiple surgeries on right foot   GANGLION CYST EXCISION Right 06/07/2012   Procedure: DISTAL POLE EXCISION RIGHT WRIST;  Surgeon: Wynonia Sours, MD;  Location: [facility];  Service: Orthopedics;  Laterality: Right;   GOLD SEED IMPLANT N/A 09/08/2019   Procedure: GOLD SEED IMPLANT;  Surgeon: Franchot Gallo, MD;  Location: [facility];  Service: Urology;  Laterality: N/A;  30 MINS   LOWER EXTREMITY ANGIOGRAPHY Right 09/15/2020   Procedure: Lower Extremity Angiography;  Surgeon: Cherre Robins, MD;  Location: [facility];  Service: Cardiovascular;  Laterality: Right;   SHOULDER SURGERY     Right shoulder   SPACE OAR INSTILLATION N/A 09/08/2019   Procedure: SPACE OAR INSTILLATION;  Surgeon: Franchot Gallo, MD;  Location: [facility];  Service: Urology;  Laterality: N/A;   SPINAL CORD STIMULATOR IMPLANT  2009   NOVI [id number]  MRI UNSAFE   SPINAL FUSION  04/04/2011   Procedure: FUSION POSTERIOR SPINAL MULTILEVEL/SCOLIOSIS;  Surgeon: Gunnar Bulla, MD;  Location: [facility];  Service: Orthopedics;  Laterality: N/A;  repair thoracic and lumbar  pseudoarthrosis, revise spinal cord stimulator, extend thoracic fusion, iliac crest bone graft   SPINE SURGERY  12/2009   Spinal Fusion by Dr. Marcial Pacas   TESTICLE SURGERY     right testicle   TONSILLECTOMY     TURP VAPORIZATION     VASCULAR SURGERY     AAA stent repair    Family History  Problem Relation Age of Onset   Melanoma Mother    Diabetes Father    Heart attack Father    Prostate cancer Father    Arthritis Sister        rheumatoid   Breast cancer Sister    Breast cancer Maternal Grandmother    Anesthesia problems Neg Hx    Hypotension Neg Hx    Malignant  hyperthermia Neg Hx    Pseudochol deficiency Neg Hx    Pancreatic cancer Neg Hx    Colon cancer Neg Hx     Social History   Socioeconomic History   Marital status: Single    Spouse name: Not on file   Number of children: Not on file   Years of education: Not on file   Highest education level: Not on file  Occupational History   Occupation: Retired  Tobacco Use   Smoking status: Former    Packs/day: 1.00    Years: 50.00  Pack years: 50.00    Types: Cigarettes    Quit date: 10/08/2013    Years since quitting: 6.9   Smokeless tobacco: Never   Tobacco comments:    states smokes "little cigars" - former  Vaping Use   Vaping Use: Never used  Substance and Sexual Activity   Alcohol use: No    Alcohol/week: 0.0 standard drinks   Drug use: No   Sexual activity: Not Currently  Other Topics Concern   Not on file  Social History Narrative   Not on file   Social Determinants of Health   Financial Resource Strain: Not on file  Food Insecurity: Not on file  Transportation Needs: Not on file  Physical Activity: Not on file  Stress: Not on file  Social Connections: Not on file  Intimate Partner Violence: Not on file    No Known Allergies  Current Outpatient Medications  Medication Sig Dispense Refill   acetaminophen (TYLENOL) 500 MG tablet Take 1,000 mg by mouth every 6 (six) hours as needed (for pain.).     amLODipine (NORVASC) 10 MG tablet Take 10 mg by mouth in the morning.     Ascorbic Acid (VITAMIN C) 1000 MG tablet Take 1,000 mg by mouth in the morning.     aspirin 325 MG tablet Take 162.5 mg by mouth in the morning.     atorvastatin (LIPITOR) 80 MG tablet Take 80 mg by mouth in the morning.     b complex vitamins tablet Take 1 tablet by mouth in the morning.     buPROPion (WELLBUTRIN XL) 150 MG 24 hr tablet Take 450 mg by mouth in the morning.     Calcium Carbonate (CALCIUM 600 PO) Take 2 tablets by mouth in the morning.     cholecalciferol (VITAMIN D) 1000 UNITS  tablet Take 1,000 Units by mouth in the morning.     clonazePAM (KLONOPIN) 1 MG tablet Take 1-2 mg by mouth See admin instructions. Take 1 tablet (1 mg) by mouth daily if needed for anxiety & take 2 tablets (2 mg) by mouth scheduled at bedtime.     diclofenac Sodium (VOLTAREN) 1 % GEL Apply 1 application topically daily as needed (pain).     diphenhydrAMINE (BENADRYL) 25 MG tablet Take 25 mg by mouth every 6 (six) hours as needed for allergies.     fentaNYL (DURAGESIC) 75 MCG/HR Place 1 patch onto the skin every other day.     furosemide (LASIX) 40 MG tablet Take 40-80 mg by mouth daily as needed (fluid retention.).     gabapentin (NEURONTIN) 300 MG capsule Take 600 mg by mouth in the morning.     ibuprofen (ADVIL,MOTRIN) 200 MG tablet Take 400 mg by mouth every 8 (eight) hours as needed (pain.).     MAGNESIUM-ZINC PO Take 1 tablet by mouth in the morning.     methocarbamol (ROBAXIN) 750 MG tablet Take 750 mg by mouth every 8 (eight) hours.     mirabegron ER (MYRBETRIQ) 25 MG TB24 tablet Take 25 mg by mouth in the morning.     Multiple Vitamin (MULTIVITAMIN WITH MINERALS) TABS tablet Take 1 tablet by mouth in the morning.     nystatin (MYCOSTATIN/NYSTOP) powder Apply 1 application topically 2 (two) times daily as needed (skin irritation/rash.).      Omega-3 Fatty Acids (FISH OIL PO) Take 2,000 mg by mouth in the morning.     Phenazopyridine HCl 99.5 MG TABS Take 2 tablets by mouth daily as needed (urinary  pain).     polyethylene glycol powder (GLYCOLAX/MIRALAX) 17 GM/SCOOP powder Take 17 g by mouth daily as needed for mild constipation.     Potassium 99 MG TABS Take 99 mg by mouth every evening.     pregabalin (LYRICA) 150 MG capsule Take 300 mg by mouth at bedtime.     pseudoephedrine (SUDAFED) 30 MG tablet Take 60 mg by mouth every 8 (eight) hours as needed for congestion.     QUEtiapine (SEROQUEL) 100 MG tablet Take 1 tablet (100 mg total) by mouth at bedtime.     quinapril (ACCUPRIL) 40 MG  tablet Take 80 mg by mouth in the morning.     RESTASIS 0.05 % ophthalmic emulsion Place 1 drop into both eyes 2 (two) times daily as needed (dry/irritated eyes.).     sennosides-docusate sodium (SENOKOT-S) 8.6-50 MG tablet Take 1 tablet by mouth at bedtime as needed for constipation.     simethicone (MYLICON) 0000000 MG chewable tablet Chew 125 mg by mouth every 6 (six) hours as needed for flatulence.     tamsulosin (FLOMAX) 0.4 MG CAPS capsule Take 0.4 mg by mouth at bedtime.     traZODone (DESYREL) 100 MG tablet Take 1 tablet (100 mg total) by mouth at bedtime.     Venlafaxine HCl 75 MG TB24 Take 225 mg by mouth in the morning.     vitamin E 180 MG (400 UNITS) capsule Take 400 Units by mouth daily.     No current facility-administered medications for this visit.    REVIEW OF SYSTEMS:  '[X]'$  denotes positive finding, '[ ]'$  denotes negative finding Cardiac  Comments:  Chest pain or chest pressure:    Shortness of breath upon exertion:    Short of breath when lying flat:    Irregular heart rhythm:        Vascular    Pain in calf, thigh, or hip brought on by ambulation: x   Pain in feet at night that wakes you up from your sleep:     Blood clot in your veins:    Leg swelling:         Pulmonary    Oxygen at home:    Productive cough:     Wheezing:         Neurologic    Sudden weakness in arms or legs:     Sudden numbness in arms or legs:     Sudden onset of difficulty speaking or slurred speech:    Temporary loss of vision in one eye:     Problems with dizziness:         Gastrointestinal    Blood in stool:     Vomited blood:         Genitourinary    Burning when urinating:     Blood in urine:        Psychiatric    Major depression:         Hematologic    Bleeding problems:    Problems with blood clotting too easily:        Skin    Rashes or ulcers:        Constitutional    Fever or chills:      PHYSICAL EXAM Vitals:   10/05/20 1407  BP: 102/65  Pulse: 74  Resp:  20  Temp: 97.8 F (36.6 C)  SpO2: 90%  Weight: 272 lb (123.4 kg)  Height: '5\' 8"'$  (1.727 m)    Constitutional: chronically ill appearing.  Diaphoretic.  Clammy skin.  Mild distress.  Obese. Neurologic: CN intact. no focal findings. no sensory loss. Psychiatric:  Mood and affect symmetric and appropriate. Eyes:  No icterus. No conjunctival pallor. Ears, nose, throat:  mucous membranes moist. Midline trachea.  Cardiac:  regular rate and rhythm.  Respiratory:  unlabored. Abdominal: obese. soft, non-tender, non-distended.  Peripheral vascular: no palpable pedal pulses. Unable to palpate femoral pulses 2/2 pannus. Extremity: no edema. no cyanosis. no pallor.  Skin: no gangrene. no ulceration.  Lymphatic: no Stemmer's sign. no palpable lymphadenopathy.  PERTINENT LABORATORY AND RADIOLOGIC DATA  Most recent CBC CBC Latest Ref Rng & Units 09/15/2020 09/15/2020 09/04/2019  WBC 4.0 - 10.5 K/uL - - 6.9  Hemoglobin 13.0 - 17.0 g/dL 15.3 15.3 16.0  Hematocrit 39.0 - 52.0 % 45.0 45.0 50.6  Platelets 150 - 400 K/uL - - 142(L)     Most recent CMP CMP Latest Ref Rng & Units 09/15/2020 09/15/2020 09/04/2019  Glucose 70 - 99 mg/dL 90 88 87  BUN 8 - 23 mg/dL 21 32(H) 24(H)  Creatinine 0.61 - 1.24 mg/dL 1.10 1.20 1.00  Sodium 135 - 145 mmol/L 141 137 139  Potassium 3.5 - 5.1 mmol/L 4.1 8.4(HH) 4.5  Chloride 98 - 111 mmol/L 105 105 106  CO2 22 - 32 mmol/L - - 24  Calcium 8.9 - 10.3 mg/dL - - 8.9  Total Protein 6.5 - 8.1 g/dL - - -  Total Bilirubin 0.3 - 1.2 mg/dL - - -  Alkaline Phos 38 - 126 U/L - - -  AST 15 - 41 U/L - - -  ALT 17 - 63 U/L - - -   LOWER EXTREMITY DOPPLER STUDY   Patient Name:  Michael Lutz  Date of Exam:   08/20/2020  Medical Rec #: [id number]       Accession #:    [id number]  Date of Birth: [birth year]       Patient Gender: M  Patient Age:   2Y  [facility]am [facility]  [facility]  Procedure:      VAS [place] ABI WITH/WO TBI  Referring Phys: [id number] Inez Catalina     ---------------------------------------------------------------------------  -----     Indications: Claudication.   High Risk Factors: Hypertension, hyperlipidemia.     Comparison Study: prior 05/21/19   Performing Technologist: Archie Patten RVS      Examination Guidelines: A complete evaluation includes at minimum, Doppler  waveform signals and systolic blood pressure reading at the level of  bilateral  brachial, anterior tibial, and posterior tibial arteries, when vessel  segments  are accessible. Bilateral testing is considered an integral part of a  complete  examination. Photoelectric Plethysmograph (PPG) waveforms and toe systolic  pressure readings are included as required and additional duplex testing  as  needed. Limited examinations for reoccurring indications may be performed  as  noted.      ABI Findings:  +---------+------------------+-----+----------+----------+  Right    Rt Pressure (mmHg)IndexWaveform  Comment     +---------+------------------+-----+----------+----------+  Brachial 166                    triphasic             +---------+------------------+-----+----------+----------+  PTA      112               0.67 biphasic              +---------+------------------+-----+----------+----------+  DP       93  0.56 monophasic            +---------+------------------+-----+----------+----------+  Great Toe128               0.77           unreliable  +---------+------------------+-----+----------+----------+   +---------+------------------+-----+---------+-------+  Left     Lt Pressure (mmHg)IndexWaveform Comment  +---------+------------------+-----+---------+-------+  Brachial 156                    triphasic         +---------+------------------+-----+---------+-------+  PTA      195               1.17 triphasic         +---------+------------------+-----+---------+-------+  DP        141               0.85 triphasic         +---------+------------------+-----+---------+-------+  Great Toe165               0.99 Normal            +---------+------------------+-----+---------+-------+   +-------+-----------+-----------+------------+------------+  ABI/TBIToday's ABIToday's TBIPrevious ABIPrevious TBI  +-------+-----------+-----------+------------+------------+  Right  0.67       0.77                                 +-------+-----------+-----------+------------+------------+  Left   1.17       0.99                                 +-------+-----------+-----------+------------+------------+          Summary:  Right: Resting right ankle-brachial index indicates moderate right lower  extremity arterial disease. TBIs are unreliable.   Left: Resting left ankle-brachial index is within normal range. No  evidence of significant left lower extremity arterial disease. The left  toe-brachial index is normal.       *See table(s) above for measurements and observations.       Electronically signed by Jamelle Haring on 08/20/2020 at 9:18:15 PM.   Angiogram/6/22 personally reviewed.  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  Would require femoral-tibial bypass.   [name]. Stanford Breed, MD [facility] Phone Number: [phone] 10/05/2020 2:37 PM

## 2020-10-05 NOTE — ED Triage Notes (Signed)
Pt reports at his appoint with vascular and vein specialists this morning he was dizzy when he stood up. +orthostatic changes and low O2 sats in the office. Lowest bp at standing was 105/66. Denies shob.

## 2020-10-07 ENCOUNTER — Other Ambulatory Visit: Payer: Self-pay

## 2020-10-07 DIAGNOSIS — I739 Peripheral vascular disease, unspecified: Secondary | ICD-10-CM

## 2020-10-15 DIAGNOSIS — M1712 Unilateral primary osteoarthritis, left knee: Secondary | ICD-10-CM | POA: Diagnosis not present

## 2020-10-15 DIAGNOSIS — J439 Emphysema, unspecified: Secondary | ICD-10-CM | POA: Diagnosis not present

## 2020-10-15 DIAGNOSIS — N401 Enlarged prostate with lower urinary tract symptoms: Secondary | ICD-10-CM | POA: Diagnosis not present

## 2020-10-15 DIAGNOSIS — I10 Essential (primary) hypertension: Secondary | ICD-10-CM | POA: Diagnosis not present

## 2020-10-15 DIAGNOSIS — I2510 Atherosclerotic heart disease of native coronary artery without angina pectoris: Secondary | ICD-10-CM | POA: Diagnosis not present

## 2020-10-15 DIAGNOSIS — F319 Bipolar disorder, unspecified: Secondary | ICD-10-CM | POA: Diagnosis not present

## 2020-10-15 DIAGNOSIS — M8580 Other specified disorders of bone density and structure, unspecified site: Secondary | ICD-10-CM | POA: Diagnosis not present

## 2020-10-15 DIAGNOSIS — E7800 Pure hypercholesterolemia, unspecified: Secondary | ICD-10-CM | POA: Diagnosis not present

## 2020-10-15 DIAGNOSIS — Z8546 Personal history of malignant neoplasm of prostate: Secondary | ICD-10-CM | POA: Diagnosis not present

## 2020-10-15 DIAGNOSIS — M170 Bilateral primary osteoarthritis of knee: Secondary | ICD-10-CM | POA: Diagnosis not present

## 2020-10-15 DIAGNOSIS — C61 Malignant neoplasm of prostate: Secondary | ICD-10-CM | POA: Diagnosis not present

## 2020-10-26 DIAGNOSIS — G894 Chronic pain syndrome: Secondary | ICD-10-CM | POA: Diagnosis not present

## 2020-10-26 DIAGNOSIS — M62838 Other muscle spasm: Secondary | ICD-10-CM | POA: Diagnosis not present

## 2020-10-26 DIAGNOSIS — M961 Postlaminectomy syndrome, not elsewhere classified: Secondary | ICD-10-CM | POA: Diagnosis not present

## 2020-10-26 DIAGNOSIS — Z79891 Long term (current) use of opiate analgesic: Secondary | ICD-10-CM | POA: Diagnosis not present

## 2020-10-28 DIAGNOSIS — I739 Peripheral vascular disease, unspecified: Secondary | ICD-10-CM | POA: Diagnosis not present

## 2020-11-09 DIAGNOSIS — R350 Frequency of micturition: Secondary | ICD-10-CM | POA: Diagnosis not present

## 2020-11-09 DIAGNOSIS — N3942 Incontinence without sensory awareness: Secondary | ICD-10-CM | POA: Diagnosis not present

## 2020-11-09 DIAGNOSIS — N401 Enlarged prostate with lower urinary tract symptoms: Secondary | ICD-10-CM | POA: Diagnosis not present

## 2020-11-09 DIAGNOSIS — N3941 Urge incontinence: Secondary | ICD-10-CM | POA: Diagnosis not present

## 2020-11-16 ENCOUNTER — Ambulatory Visit: Payer: PPO | Admitting: Podiatry

## 2020-11-25 ENCOUNTER — Ambulatory Visit: Payer: PPO | Admitting: Podiatry

## 2020-11-25 DIAGNOSIS — N401 Enlarged prostate with lower urinary tract symptoms: Secondary | ICD-10-CM | POA: Diagnosis not present

## 2020-11-25 DIAGNOSIS — J439 Emphysema, unspecified: Secondary | ICD-10-CM | POA: Diagnosis not present

## 2020-11-25 DIAGNOSIS — I2510 Atherosclerotic heart disease of native coronary artery without angina pectoris: Secondary | ICD-10-CM | POA: Diagnosis not present

## 2020-11-25 DIAGNOSIS — M8580 Other specified disorders of bone density and structure, unspecified site: Secondary | ICD-10-CM | POA: Diagnosis not present

## 2020-11-25 DIAGNOSIS — F319 Bipolar disorder, unspecified: Secondary | ICD-10-CM | POA: Diagnosis not present

## 2020-11-25 DIAGNOSIS — I10 Essential (primary) hypertension: Secondary | ICD-10-CM | POA: Diagnosis not present

## 2020-11-25 DIAGNOSIS — M1712 Unilateral primary osteoarthritis, left knee: Secondary | ICD-10-CM | POA: Diagnosis not present

## 2020-11-25 DIAGNOSIS — E785 Hyperlipidemia, unspecified: Secondary | ICD-10-CM | POA: Diagnosis not present

## 2020-11-25 DIAGNOSIS — E7800 Pure hypercholesterolemia, unspecified: Secondary | ICD-10-CM | POA: Diagnosis not present

## 2020-11-25 DIAGNOSIS — M170 Bilateral primary osteoarthritis of knee: Secondary | ICD-10-CM | POA: Diagnosis not present

## 2020-12-06 ENCOUNTER — Ambulatory Visit: Payer: PPO | Admitting: Podiatry

## 2020-12-08 ENCOUNTER — Encounter: Payer: Self-pay | Admitting: Podiatry

## 2020-12-08 ENCOUNTER — Ambulatory Visit (INDEPENDENT_AMBULATORY_CARE_PROVIDER_SITE_OTHER): Payer: PPO

## 2020-12-08 ENCOUNTER — Other Ambulatory Visit: Payer: Self-pay

## 2020-12-08 ENCOUNTER — Ambulatory Visit (INDEPENDENT_AMBULATORY_CARE_PROVIDER_SITE_OTHER): Payer: PPO | Admitting: Podiatry

## 2020-12-08 DIAGNOSIS — G629 Polyneuropathy, unspecified: Secondary | ICD-10-CM | POA: Diagnosis not present

## 2020-12-08 DIAGNOSIS — M2042 Other hammer toe(s) (acquired), left foot: Secondary | ICD-10-CM | POA: Diagnosis not present

## 2020-12-08 DIAGNOSIS — M2041 Other hammer toe(s) (acquired), right foot: Secondary | ICD-10-CM

## 2020-12-08 NOTE — Progress Notes (Signed)
  Subjective:  Patient ID: Michael Lutz, male    DOB: [birth year],  MRN: [id number]  Chief Complaint  Patient presents with   Nail Problem    Toe nail problem     DOS: 05/28/2020 Procedure: Left fourth, bilateral hallux permanent total nail avulsion, injection of plantar fibroma, arthroplasty right fifth toe  75 y.o. male returns for post-op check.  Still having quite a bit of pain on the fifth toe, says it feels like a burning sensation  Review of Systems: Negative except as noted in the HPI. Denies N/V/F/Ch.   Objective:  There were no vitals filed for this visit. There is no height or weight on file to calculate BMI. Constitutional Well developed. Well nourished.  Vascular Foot warm and well perfused. Capillary refill normal to all digits.   Neurologic Normal speech. Oriented to person, place, and time. Epicritic sensation to light touch grossly present bilaterally.  Dermatologic Incision is well-healed on the fifth toe  Orthopedic: Tenderness to palpation noted about the surgical site.   Radiographs: Status post arthroplasty of the right fifth toe, similar in appearance to previous radiographs Assessment:   1. Peripheral neuritis   2. Hammertoe of left foot   3. Hammertoe of right foot    Plan:  Patient was evaluated and treated and all questions answered.  S/p foot surgery right -Nearly 6 months out since his surgery think he does have some postoperative neuritis.  I recommended corticosteroid injection of the digital nerve on the medial side of the fifth toe.  Following sterile prep with alcohol I injected 4 mg dexamethasone phosphate into the area with local infiltration and he tolerated this well.  We will see if this is helpful.  I did take an x-ray and there is no indication that there is bony overgrowth that we should repeat his arthroplasty    Return in about 4 weeks (around 01/05/2021) for re-check 5th toe .

## 2020-12-21 DIAGNOSIS — M961 Postlaminectomy syndrome, not elsewhere classified: Secondary | ICD-10-CM | POA: Diagnosis not present

## 2020-12-21 DIAGNOSIS — Z79891 Long term (current) use of opiate analgesic: Secondary | ICD-10-CM | POA: Diagnosis not present

## 2020-12-21 DIAGNOSIS — G894 Chronic pain syndrome: Secondary | ICD-10-CM | POA: Diagnosis not present

## 2020-12-21 DIAGNOSIS — M62838 Other muscle spasm: Secondary | ICD-10-CM | POA: Diagnosis not present

## 2021-01-03 DIAGNOSIS — M170 Bilateral primary osteoarthritis of knee: Secondary | ICD-10-CM | POA: Diagnosis not present

## 2021-01-03 DIAGNOSIS — E7800 Pure hypercholesterolemia, unspecified: Secondary | ICD-10-CM | POA: Diagnosis not present

## 2021-01-03 DIAGNOSIS — I10 Essential (primary) hypertension: Secondary | ICD-10-CM | POA: Diagnosis not present

## 2021-01-03 DIAGNOSIS — M8580 Other specified disorders of bone density and structure, unspecified site: Secondary | ICD-10-CM | POA: Diagnosis not present

## 2021-01-03 DIAGNOSIS — N401 Enlarged prostate with lower urinary tract symptoms: Secondary | ICD-10-CM | POA: Diagnosis not present

## 2021-01-03 DIAGNOSIS — I2510 Atherosclerotic heart disease of native coronary artery without angina pectoris: Secondary | ICD-10-CM | POA: Diagnosis not present

## 2021-01-03 DIAGNOSIS — M1711 Unilateral primary osteoarthritis, right knee: Secondary | ICD-10-CM | POA: Diagnosis not present

## 2021-01-03 DIAGNOSIS — J439 Emphysema, unspecified: Secondary | ICD-10-CM | POA: Diagnosis not present

## 2021-01-03 DIAGNOSIS — F319 Bipolar disorder, unspecified: Secondary | ICD-10-CM | POA: Diagnosis not present

## 2021-01-03 DIAGNOSIS — M1712 Unilateral primary osteoarthritis, left knee: Secondary | ICD-10-CM | POA: Diagnosis not present

## 2021-01-06 ENCOUNTER — Other Ambulatory Visit: Payer: Self-pay

## 2021-01-06 ENCOUNTER — Ambulatory Visit (INDEPENDENT_AMBULATORY_CARE_PROVIDER_SITE_OTHER): Payer: PPO | Admitting: Podiatry

## 2021-01-06 DIAGNOSIS — G629 Polyneuropathy, unspecified: Secondary | ICD-10-CM | POA: Diagnosis not present

## 2021-01-10 NOTE — Progress Notes (Signed)
  Subjective:  Patient ID: Michael Lutz, male    DOB: [birth year],  MRN: [id number]  Chief Complaint  Patient presents with   Hammer Toe    4 weeks  for re-check 5th toe .    DOS: 05/28/2020 Procedure: Left fourth, bilateral hallux permanent total nail avulsion, injection of plantar fibroma, arthroplasty right fifth toe  75 y.o. male returns for post-op check.  Injection improved quite a bit, says approximately 60% better  Review of Systems: Negative except as noted in the HPI. Denies N/V/F/Ch.   Objective:  There were no vitals filed for this visit. There is no height or weight on file to calculate BMI. Constitutional Well developed. Well nourished.  Vascular Foot warm and well perfused. Capillary refill normal to all digits.   Neurologic Normal speech. Oriented to person, place, and time. Epicritic sensation to light touch grossly present bilaterally.  Dermatologic Incision is well-healed on the fifth toe  Orthopedic: Tenderness to palpation noted about the surgical site.   Radiographs: Status post arthroplasty of the right fifth toe, similar in appearance to previous radiographs Assessment:   1. Peripheral neuritis     Plan:  Patient was evaluated and treated and all questions answered.  S/p foot surgery right -Improved quite a bit after the last injection.  I told him it was too early for another injection today and he will see me back in a few weeks for the second 1    Return for fifth toe injection .

## 2021-02-01 DIAGNOSIS — M216X1 Other acquired deformities of right foot: Secondary | ICD-10-CM | POA: Diagnosis not present

## 2021-02-01 DIAGNOSIS — M654 Radial styloid tenosynovitis [de Quervain]: Secondary | ICD-10-CM | POA: Diagnosis not present

## 2021-02-15 DIAGNOSIS — M961 Postlaminectomy syndrome, not elsewhere classified: Secondary | ICD-10-CM | POA: Diagnosis not present

## 2021-02-15 DIAGNOSIS — M62838 Other muscle spasm: Secondary | ICD-10-CM | POA: Diagnosis not present

## 2021-02-15 DIAGNOSIS — Z79891 Long term (current) use of opiate analgesic: Secondary | ICD-10-CM | POA: Diagnosis not present

## 2021-02-15 DIAGNOSIS — G894 Chronic pain syndrome: Secondary | ICD-10-CM | POA: Diagnosis not present

## 2021-02-24 ENCOUNTER — Ambulatory Visit: Payer: PPO | Admitting: Podiatry

## 2021-02-25 DIAGNOSIS — I10 Essential (primary) hypertension: Secondary | ICD-10-CM | POA: Diagnosis not present

## 2021-02-25 DIAGNOSIS — F3341 Major depressive disorder, recurrent, in partial remission: Secondary | ICD-10-CM | POA: Diagnosis not present

## 2021-03-03 ENCOUNTER — Ambulatory Visit: Payer: PPO | Admitting: Podiatry

## 2021-03-15 DIAGNOSIS — Z1389 Encounter for screening for other disorder: Secondary | ICD-10-CM | POA: Diagnosis not present

## 2021-03-15 DIAGNOSIS — Z23 Encounter for immunization: Secondary | ICD-10-CM | POA: Diagnosis not present

## 2021-03-15 DIAGNOSIS — Z0000 Encounter for general adult medical examination without abnormal findings: Secondary | ICD-10-CM | POA: Diagnosis not present

## 2021-03-18 DIAGNOSIS — R6883 Chills (without fever): Secondary | ICD-10-CM | POA: Diagnosis not present

## 2021-03-18 DIAGNOSIS — R109 Unspecified abdominal pain: Secondary | ICD-10-CM | POA: Diagnosis not present

## 2021-03-18 DIAGNOSIS — Z03818 Encounter for observation for suspected exposure to other biological agents ruled out: Secondary | ICD-10-CM | POA: Diagnosis not present

## 2021-03-18 DIAGNOSIS — R3121 Asymptomatic microscopic hematuria: Secondary | ICD-10-CM | POA: Diagnosis not present

## 2021-03-18 DIAGNOSIS — R5383 Other fatigue: Secondary | ICD-10-CM | POA: Diagnosis not present

## 2021-03-22 DIAGNOSIS — R1084 Generalized abdominal pain: Secondary | ICD-10-CM | POA: Diagnosis not present

## 2021-04-18 DIAGNOSIS — Z79891 Long term (current) use of opiate analgesic: Secondary | ICD-10-CM | POA: Diagnosis not present

## 2021-04-18 DIAGNOSIS — M62838 Other muscle spasm: Secondary | ICD-10-CM | POA: Diagnosis not present

## 2021-04-18 DIAGNOSIS — M961 Postlaminectomy syndrome, not elsewhere classified: Secondary | ICD-10-CM | POA: Diagnosis not present

## 2021-04-18 DIAGNOSIS — G894 Chronic pain syndrome: Secondary | ICD-10-CM | POA: Diagnosis not present

## 2021-05-02 ENCOUNTER — Emergency Department (HOSPITAL_COMMUNITY): Payer: PPO

## 2021-05-02 ENCOUNTER — Other Ambulatory Visit: Payer: Self-pay

## 2021-05-02 ENCOUNTER — Inpatient Hospital Stay (HOSPITAL_COMMUNITY)
Admission: EM | Admit: 2021-05-02 | Discharge: 2021-05-11 | DRG: 091 | Disposition: E | Payer: PPO | Attending: Pulmonary Disease | Admitting: Pulmonary Disease

## 2021-05-02 DIAGNOSIS — Z9114 Patient's other noncompliance with medication regimen: Secondary | ICD-10-CM

## 2021-05-02 DIAGNOSIS — R0989 Other specified symptoms and signs involving the circulatory and respiratory systems: Secondary | ICD-10-CM | POA: Diagnosis not present

## 2021-05-02 DIAGNOSIS — Z808 Family history of malignant neoplasm of other organs or systems: Secondary | ICD-10-CM

## 2021-05-02 DIAGNOSIS — I469 Cardiac arrest, cause unspecified: Secondary | ICD-10-CM

## 2021-05-02 DIAGNOSIS — Z981 Arthrodesis status: Secondary | ICD-10-CM

## 2021-05-02 DIAGNOSIS — G928 Other toxic encephalopathy: Secondary | ICD-10-CM | POA: Diagnosis present

## 2021-05-02 DIAGNOSIS — F0393 Unspecified dementia, unspecified severity, with mood disturbance: Secondary | ICD-10-CM | POA: Diagnosis present

## 2021-05-02 DIAGNOSIS — A419 Sepsis, unspecified organism: Secondary | ICD-10-CM | POA: Diagnosis not present

## 2021-05-02 DIAGNOSIS — J690 Pneumonitis due to inhalation of food and vomit: Secondary | ICD-10-CM | POA: Diagnosis not present

## 2021-05-02 DIAGNOSIS — F1123 Opioid dependence with withdrawal: Secondary | ICD-10-CM | POA: Diagnosis present

## 2021-05-02 DIAGNOSIS — Z9689 Presence of other specified functional implants: Secondary | ICD-10-CM

## 2021-05-02 DIAGNOSIS — I4891 Unspecified atrial fibrillation: Secondary | ICD-10-CM | POA: Diagnosis present

## 2021-05-02 DIAGNOSIS — Z515 Encounter for palliative care: Secondary | ICD-10-CM

## 2021-05-02 DIAGNOSIS — R55 Syncope and collapse: Secondary | ICD-10-CM | POA: Diagnosis not present

## 2021-05-02 DIAGNOSIS — I739 Peripheral vascular disease, unspecified: Secondary | ICD-10-CM | POA: Diagnosis present

## 2021-05-02 DIAGNOSIS — T424X5A Adverse effect of benzodiazepines, initial encounter: Secondary | ICD-10-CM | POA: Diagnosis present

## 2021-05-02 DIAGNOSIS — R531 Weakness: Secondary | ICD-10-CM | POA: Diagnosis not present

## 2021-05-02 DIAGNOSIS — G9341 Metabolic encephalopathy: Secondary | ICD-10-CM

## 2021-05-02 DIAGNOSIS — I110 Hypertensive heart disease with heart failure: Secondary | ICD-10-CM | POA: Diagnosis present

## 2021-05-02 DIAGNOSIS — E870 Hyperosmolality and hypernatremia: Secondary | ICD-10-CM

## 2021-05-02 DIAGNOSIS — F0394 Unspecified dementia, unspecified severity, with anxiety: Secondary | ICD-10-CM | POA: Diagnosis present

## 2021-05-02 DIAGNOSIS — I5032 Chronic diastolic (congestive) heart failure: Secondary | ICD-10-CM | POA: Diagnosis present

## 2021-05-02 DIAGNOSIS — T40415A Adverse effect of fentanyl or fentanyl analogs, initial encounter: Secondary | ICD-10-CM | POA: Diagnosis present

## 2021-05-02 DIAGNOSIS — K219 Gastro-esophageal reflux disease without esophagitis: Secondary | ICD-10-CM | POA: Diagnosis present

## 2021-05-02 DIAGNOSIS — I10 Essential (primary) hypertension: Secondary | ICD-10-CM | POA: Diagnosis not present

## 2021-05-02 DIAGNOSIS — Z4682 Encounter for fitting and adjustment of non-vascular catheter: Secondary | ICD-10-CM

## 2021-05-02 DIAGNOSIS — Z8249 Family history of ischemic heart disease and other diseases of the circulatory system: Secondary | ICD-10-CM

## 2021-05-02 DIAGNOSIS — T43215A Adverse effect of selective serotonin and norepinephrine reuptake inhibitors, initial encounter: Secondary | ICD-10-CM | POA: Diagnosis present

## 2021-05-02 DIAGNOSIS — Z20822 Contact with and (suspected) exposure to covid-19: Secondary | ICD-10-CM | POA: Diagnosis present

## 2021-05-02 DIAGNOSIS — R918 Other nonspecific abnormal finding of lung field: Secondary | ICD-10-CM | POA: Diagnosis not present

## 2021-05-02 DIAGNOSIS — J9601 Acute respiratory failure with hypoxia: Secondary | ICD-10-CM | POA: Diagnosis not present

## 2021-05-02 DIAGNOSIS — J95811 Postprocedural pneumothorax: Secondary | ICD-10-CM | POA: Diagnosis not present

## 2021-05-02 DIAGNOSIS — Z87891 Personal history of nicotine dependence: Secondary | ICD-10-CM

## 2021-05-02 DIAGNOSIS — Z66 Do not resuscitate: Secondary | ICD-10-CM | POA: Diagnosis not present

## 2021-05-02 DIAGNOSIS — Z9682 Presence of neurostimulator: Secondary | ICD-10-CM

## 2021-05-02 DIAGNOSIS — E7800 Pure hypercholesterolemia, unspecified: Secondary | ICD-10-CM | POA: Diagnosis present

## 2021-05-02 DIAGNOSIS — J9383 Other pneumothorax: Secondary | ICD-10-CM | POA: Diagnosis not present

## 2021-05-02 DIAGNOSIS — Z8042 Family history of malignant neoplasm of prostate: Secondary | ICD-10-CM

## 2021-05-02 DIAGNOSIS — G9340 Encephalopathy, unspecified: Secondary | ICD-10-CM | POA: Diagnosis not present

## 2021-05-02 DIAGNOSIS — B372 Candidiasis of skin and nail: Secondary | ICD-10-CM | POA: Diagnosis present

## 2021-05-02 DIAGNOSIS — F310 Bipolar disorder, current episode hypomanic: Secondary | ICD-10-CM | POA: Diagnosis not present

## 2021-05-02 DIAGNOSIS — Z6841 Body Mass Index (BMI) 40.0 and over, adult: Secondary | ICD-10-CM | POA: Diagnosis not present

## 2021-05-02 DIAGNOSIS — R0689 Other abnormalities of breathing: Secondary | ICD-10-CM | POA: Diagnosis not present

## 2021-05-02 DIAGNOSIS — G894 Chronic pain syndrome: Secondary | ICD-10-CM | POA: Diagnosis present

## 2021-05-02 DIAGNOSIS — Z0189 Encounter for other specified special examinations: Secondary | ICD-10-CM

## 2021-05-02 DIAGNOSIS — Z833 Family history of diabetes mellitus: Secondary | ICD-10-CM

## 2021-05-02 DIAGNOSIS — R0603 Acute respiratory distress: Secondary | ICD-10-CM | POA: Diagnosis not present

## 2021-05-02 DIAGNOSIS — R4189 Other symptoms and signs involving cognitive functions and awareness: Secondary | ICD-10-CM

## 2021-05-02 DIAGNOSIS — R410 Disorientation, unspecified: Secondary | ICD-10-CM | POA: Diagnosis not present

## 2021-05-02 DIAGNOSIS — J939 Pneumothorax, unspecified: Secondary | ICD-10-CM | POA: Diagnosis not present

## 2021-05-02 DIAGNOSIS — Z9189 Other specified personal risk factors, not elsewhere classified: Secondary | ICD-10-CM | POA: Diagnosis not present

## 2021-05-02 DIAGNOSIS — Z8546 Personal history of malignant neoplasm of prostate: Secondary | ICD-10-CM

## 2021-05-02 DIAGNOSIS — Z79899 Other long term (current) drug therapy: Secondary | ICD-10-CM

## 2021-05-02 DIAGNOSIS — Y658 Other specified misadventures during surgical and medical care: Secondary | ICD-10-CM | POA: Diagnosis not present

## 2021-05-02 DIAGNOSIS — N179 Acute kidney failure, unspecified: Secondary | ICD-10-CM | POA: Diagnosis present

## 2021-05-02 DIAGNOSIS — R6521 Severe sepsis with septic shock: Secondary | ICD-10-CM | POA: Diagnosis not present

## 2021-05-02 DIAGNOSIS — N400 Enlarged prostate without lower urinary tract symptoms: Secondary | ICD-10-CM | POA: Diagnosis present

## 2021-05-02 DIAGNOSIS — F13239 Sedative, hypnotic or anxiolytic dependence with withdrawal, unspecified: Secondary | ICD-10-CM | POA: Diagnosis present

## 2021-05-02 DIAGNOSIS — Z7982 Long term (current) use of aspirin: Secondary | ICD-10-CM

## 2021-05-02 DIAGNOSIS — Z803 Family history of malignant neoplasm of breast: Secondary | ICD-10-CM

## 2021-05-02 DIAGNOSIS — J9811 Atelectasis: Secondary | ICD-10-CM | POA: Diagnosis not present

## 2021-05-02 DIAGNOSIS — F319 Bipolar disorder, unspecified: Secondary | ICD-10-CM | POA: Diagnosis present

## 2021-05-02 DIAGNOSIS — T43595A Adverse effect of other antipsychotics and neuroleptics, initial encounter: Secondary | ICD-10-CM | POA: Diagnosis present

## 2021-05-02 DIAGNOSIS — I517 Cardiomegaly: Secondary | ICD-10-CM | POA: Diagnosis not present

## 2021-05-02 DIAGNOSIS — R0902 Hypoxemia: Secondary | ICD-10-CM | POA: Diagnosis not present

## 2021-05-02 DIAGNOSIS — R4182 Altered mental status, unspecified: Secondary | ICD-10-CM | POA: Diagnosis present

## 2021-05-02 DIAGNOSIS — T428X5A Adverse effect of antiparkinsonism drugs and other central muscle-tone depressants, initial encounter: Secondary | ICD-10-CM | POA: Diagnosis present

## 2021-05-02 DIAGNOSIS — E876 Hypokalemia: Secondary | ICD-10-CM

## 2021-05-02 LAB — URINALYSIS, ROUTINE W REFLEX MICROSCOPIC
Bacteria, UA: NONE SEEN
Bilirubin Urine: NEGATIVE
Glucose, UA: NEGATIVE mg/dL
Ketones, ur: 20 mg/dL — AB
Leukocytes,Ua: NEGATIVE
Nitrite: NEGATIVE
Protein, ur: >=300 mg/dL — AB
Specific Gravity, Urine: 1.022 (ref 1.005–1.030)
pH: 5 (ref 5.0–8.0)

## 2021-05-02 LAB — ETHANOL: Alcohol, Ethyl (B): <10 mg/dL (ref <10)

## 2021-05-02 LAB — CBC WITH DIFFERENTIAL/PLATELET
Abs Immature Granulocytes: 0.05 10*3/uL (ref 0.00–0.07)
Basophils Absolute: 0.1 10*3/uL (ref 0.0–0.1)
Basophils Relative: 1 %
Eosinophils Absolute: 0 10*3/uL (ref 0.0–0.5)
Eosinophils Relative: 0 %
HCT: 49.5 % (ref 39.0–52.0)
Hemoglobin: 16.2 g/dL (ref 13.0–17.0)
Immature Granulocytes: 1 %
Lymphocytes Relative: 12 %
Lymphs Abs: 1.3 10*3/uL (ref 0.7–4.0)
MCH: 28.1 pg (ref 26.0–34.0)
MCHC: 32.7 g/dL (ref 30.0–36.0)
MCV: 85.8 fL (ref 80.0–100.0)
Monocytes Absolute: 0.8 10*3/uL (ref 0.1–1.0)
Monocytes Relative: 7 %
Neutro Abs: 8.9 10*3/uL — ABNORMAL HIGH (ref 1.7–7.7)
Neutrophils Relative %: 79 %
Platelets: 153 10*3/uL (ref 150–400)
RBC: 5.77 MIL/uL (ref 4.22–5.81)
RDW: 16.1 % — ABNORMAL HIGH (ref 11.5–15.5)
WBC: 11.1 10*3/uL — ABNORMAL HIGH (ref 4.0–10.5)
nRBC: 0 % (ref 0.0–0.2)

## 2021-05-02 LAB — RAPID URINE DRUG SCREEN, HOSP PERFORMED
Amphetamines: NOT DETECTED
Barbiturates: NOT DETECTED
Benzodiazepines: POSITIVE — AB
Cocaine: NOT DETECTED
Opiates: NOT DETECTED
Tetrahydrocannabinol: NOT DETECTED

## 2021-05-02 LAB — RESP PANEL BY RT-PCR (FLU A&B, COVID) ARPGX2
Influenza A by PCR: NEGATIVE
Influenza B by PCR: NEGATIVE
SARS Coronavirus 2 by RT PCR: NEGATIVE

## 2021-05-02 LAB — COMPREHENSIVE METABOLIC PANEL
ALT: 19 U/L (ref 0–44)
AST: 34 U/L (ref 15–41)
Albumin: 3.9 g/dL (ref 3.5–5.0)
Alkaline Phosphatase: 50 U/L (ref 38–126)
Anion gap: 12 (ref 5–15)
BUN: 24 mg/dL — ABNORMAL HIGH (ref 8–23)
CO2: 19 mmol/L — ABNORMAL LOW (ref 22–32)
Calcium: 9.2 mg/dL (ref 8.9–10.3)
Chloride: 108 mmol/L (ref 98–111)
Creatinine, Ser: 1.13 mg/dL (ref 0.61–1.24)
GFR, Estimated: >60 mL/min (ref >60)
Glucose, Bld: 103 mg/dL — ABNORMAL HIGH (ref 70–99)
Potassium: 3.4 mmol/L — ABNORMAL LOW (ref 3.5–5.1)
Sodium: 139 mmol/L (ref 135–145)
Total Bilirubin: 1.3 mg/dL — ABNORMAL HIGH (ref 0.3–1.2)
Total Protein: 7.3 g/dL (ref 6.5–8.1)

## 2021-05-02 LAB — LACTIC ACID, PLASMA: Lactic Acid, Venous: 1.2 mmol/L (ref 0.5–1.9)

## 2021-05-02 LAB — BRAIN NATRIURETIC PEPTIDE: B Natriuretic Peptide: 334.2 pg/mL — ABNORMAL HIGH (ref 0.0–100.0)

## 2021-05-02 LAB — AMMONIA: Ammonia: 35 umol/L (ref 9–35)

## 2021-05-02 MED ORDER — NALOXONE HCL 0.4 MG/ML IJ SOLN
0.4000 mg | Freq: Once | INTRAMUSCULAR | Status: AC
  Administered 2021-05-02: 0.4 mg via INTRAVENOUS
  Filled 2021-05-02: qty 1

## 2021-05-02 MED ORDER — HYDRALAZINE HCL 20 MG/ML IJ SOLN
10.0000 mg | Freq: Four times a day (QID) | INTRAMUSCULAR | Status: DC | PRN
  Administered 2021-05-03 – 2021-05-07 (×4): 10 mg via INTRAVENOUS
  Filled 2021-05-02 (×4): qty 1

## 2021-05-02 MED ORDER — ADULT MULTIVITAMIN W/MINERALS CH
1.0000 | ORAL_TABLET | Freq: Every day | ORAL | Status: DC
  Administered 2021-05-03: 1 via ORAL
  Filled 2021-05-02 (×2): qty 1

## 2021-05-02 MED ORDER — ONDANSETRON HCL 4 MG PO TABS: 4.0000 mg | ORAL_TABLET | Freq: Four times a day (QID) | ORAL | Status: DC | PRN

## 2021-05-02 MED ORDER — POTASSIUM CHLORIDE IN NACL 40-0.9 MEQ/L-% IV SOLN
INTRAVENOUS | Status: AC
  Filled 2021-05-02 (×2): qty 1000

## 2021-05-02 MED ORDER — ACETAMINOPHEN 650 MG RE SUPP: 650.0000 mg | Freq: Four times a day (QID) | RECTAL | Status: DC | PRN

## 2021-05-02 MED ORDER — SENNOSIDES-DOCUSATE SODIUM 8.6-50 MG PO TABS: 1.0000 | ORAL_TABLET | Freq: Every evening | ORAL | Status: DC | PRN

## 2021-05-02 MED ORDER — FOLIC ACID 1 MG PO TABS
1.0000 mg | ORAL_TABLET | Freq: Every day | ORAL | Status: DC
  Administered 2021-05-03: 1 mg via ORAL
  Filled 2021-05-02: qty 1

## 2021-05-02 MED ORDER — LORAZEPAM 2 MG/ML IJ SOLN
0.5000 mg | Freq: Once | INTRAMUSCULAR | Status: AC | PRN
  Administered 2021-05-02: 0.5 mg via INTRAVENOUS
  Filled 2021-05-02: qty 1

## 2021-05-02 MED ORDER — THIAMINE HCL 100 MG PO TABS
100.0000 mg | ORAL_TABLET | Freq: Every day | ORAL | Status: DC
  Administered 2021-05-03: 100 mg via ORAL
  Filled 2021-05-02: qty 1

## 2021-05-02 MED ORDER — SODIUM CHLORIDE 0.9 % IV BOLUS
1000.0000 mL | Freq: Once | INTRAVENOUS | Status: AC
  Administered 2021-05-02: 1000 mL via INTRAVENOUS

## 2021-05-02 MED ORDER — THIAMINE HCL 100 MG/ML IJ SOLN
100.0000 mg | Freq: Every day | INTRAMUSCULAR | Status: DC
  Administered 2021-05-04 – 2021-05-06 (×3): 100 mg via INTRAVENOUS
  Filled 2021-05-02 (×3): qty 2

## 2021-05-02 MED ORDER — ONDANSETRON HCL 4 MG/2ML IJ SOLN: 4.0000 mg | Freq: Four times a day (QID) | INTRAMUSCULAR | Status: DC | PRN

## 2021-05-02 MED ORDER — ACETAMINOPHEN 325 MG PO TABS
650.0000 mg | ORAL_TABLET | Freq: Four times a day (QID) | ORAL | Status: DC | PRN
  Filled 2021-05-02: qty 2

## 2021-05-02 MED ORDER — ENOXAPARIN SODIUM 40 MG/0.4ML IJ SOSY
40.0000 mg | PREFILLED_SYRINGE | INTRAMUSCULAR | Status: DC
  Administered 2021-05-02 – 2021-05-06 (×5): 40 mg via SUBCUTANEOUS
  Filled 2021-05-02 (×5): qty 0.4

## 2021-05-02 NOTE — Assessment & Plan Note (Signed)
Family reports chronic issues with memory impairment and have noted that he has been having increased difficulty taking his medications, eating, losing his keys, and other daily activities.  They are concerned about underlying dementia.  Place on delirium precautions while in hospital.

## 2021-05-02 NOTE — Hospital Course (Signed)
Michael Lutz is a 76 y.o. male with medical history significant for HTN, HLD, PVD, AAA s/p EVAR, prostate cancer, bipolar disorder with anxiety on chronic Klonopin, chronic back pain using fentanyl patch, memory issues who is admitted with acute encephalopathy presumed due to polypharmacy +/- dehydration on background of presumed cognitive impairment/dementia.

## 2021-05-02 NOTE — Assessment & Plan Note (Signed)
Resume statin pending medication reconciliation.

## 2021-05-02 NOTE — Assessment & Plan Note (Signed)
Uses fentanyl patch chronically, resume when mental status more clear.

## 2021-05-02 NOTE — Assessment & Plan Note (Signed)
Resume home meds once mental status improves.

## 2021-05-02 NOTE — ED Provider Notes (Signed)
[facility] EMERGENCY DEPARTMENT Provider Note   CSN: [id number] Arrival date & time: 04/13/2021  1519     History  Chief Complaint  Patient presents with   Altered Mental Status    [name] is a 76 y.o. male.  HPI  HPI will be deferred due to level 5 caveat altered mental status  Patient with medical history including PAD, hypertension, CHF, chronic back pain AAA with EVAR repair cardiac catheterization presents to the emerged part with complaint complaints of altered mental status.  Per EMS daughter called as patient has been altered, last known well was last night.  Patient oriented to self he had no complaints.  Daughter was at bedside she explains that patient was altered starting yesterday, states that her husband went to visit him and noted that he seemed a bit confused, she went to visit him today found that he was unable to answer any questions and just seems lethargic.  She denies  recent falls, he is not on anticoag, she is unaware if he has been having \ fevers chills cough congestion chest pain shortness of breath general body aches.  She does note that it appears that has not been eating or drinking very much, he is noted to be on multiple medications and she is unsure if he has been taking them or how much.  She notes that there are pills scattered around her house when she came in to visit him.  Home Medications Prior to Admission medications   Medication Sig Start Date End Date Taking? Authorizing Provider  acetaminophen (TYLENOL) 500 MG tablet Take 1,000 mg by mouth every 6 (six) hours as needed (for pain.).    [provider]  amLODipine (NORVASC) 10 MG tablet Take 10 mg by mouth in the morning. 01/06/16   [provider]  Ascorbic Acid (VITAMIN C) 1000 MG tablet Take 1,000 mg by mouth in the morning.    [provider]  aspirin 325 MG tablet Take 162.5 mg by mouth in the morning.    [provider]   atorvastatin (LIPITOR) 80 MG tablet Take 80 mg by mouth in the morning. 08/11/20   [provider]  b complex vitamins tablet Take 1 tablet by mouth in the morning.    [provider]  buPROPion (WELLBUTRIN XL) 150 MG 24 hr tablet Take 450 mg by mouth in the morning. 07/28/19   [provider]  Calcium Carbonate (CALCIUM 600 PO) Take 2 tablets by mouth in the morning.    [provider]  cholecalciferol (VITAMIN D) 1000 UNITS tablet Take 1,000 Units by mouth in the morning.    [provider]  clonazePAM (KLONOPIN) 1 MG tablet Take 1-2 mg by mouth See admin instructions. Take 1 tablet (1 mg) by mouth daily if needed for anxiety & take 2 tablets (2 mg) by mouth scheduled at bedtime. 08/18/19   [provider]  diclofenac Sodium (VOLTAREN) 1 % GEL Apply 1 application topically daily as needed (pain). 02/25/20   [provider]  diphenhydrAMINE (BENADRYL) 25 MG tablet Take 25 mg by mouth every 6 (six) hours as needed for allergies.    [provider]  fentaNYL (DURAGESIC) 75 MCG/HR Place 1 patch onto the skin every other day. 08/19/19   [provider]  furosemide (LASIX) 40 MG tablet Take 40-80 mg by mouth daily as needed (fluid retention.).    [provider]  gabapentin (NEURONTIN) 300 MG capsule Take 600 mg  by mouth in the morning. 08/27/19   [provider]  ibuprofen (ADVIL,MOTRIN) 200 MG tablet Take 400 mg by mouth every 8 (eight) hours as needed (pain.).    [provider]  MAGNESIUM-ZINC PO Take 1 tablet by mouth in the morning.    [provider]  methocarbamol (ROBAXIN) 750 MG tablet Take 750 mg by mouth every 8 (eight) hours. 08/19/19   [provider]  mirabegron ER (MYRBETRIQ) 25 MG TB24 tablet Take 25 mg by mouth in the morning.    [provider]  Multiple Vitamin (MULTIVITAMIN WITH MINERALS) TABS tablet Take 1 tablet by mouth in the morning.    [provider]  nystatin (MYCOSTATIN/NYSTOP) powder Apply 1 application topically 2 (two) times daily as needed (skin irritation/rash.).  08/19/19   [provider]  Omega-3 Fatty Acids (FISH OIL PO) Take 2,000 mg by mouth in the morning.    [provider]  Phenazopyridine HCl 99.5 MG TABS Take 2 tablets by mouth daily as needed (urinary pain).    [provider]  polyethylene glycol powder (GLYCOLAX/MIRALAX) 17 GM/SCOOP powder Take 17 g by mouth daily as needed for mild constipation.    [provider]  Potassium 99 MG TABS Take 99 mg by mouth every evening. 10/14/08   [provider]  pregabalin (LYRICA) 150 MG capsule Take 300 mg by mouth at bedtime. 08/19/19   [provider]  pseudoephedrine (SUDAFED) 30 MG tablet Take 60 mg by mouth every 8 (eight) hours as needed for congestion.    [provider]  QUEtiapine (SEROQUEL) 100 MG tablet Take 1 tablet (100 mg total) by mouth at bedtime. 06/05/16   Domenic Polite, MD  quinapril (ACCUPRIL) 40 MG tablet Take 80 mg by mouth in the morning.    [provider]  RESTASIS 0.05 % ophthalmic emulsion Place 1 drop into both eyes 2 (two) times daily as needed (dry/irritated eyes.). 02/11/16   [provider]  sennosides-docusate sodium (SENOKOT-S) 8.6-50 MG tablet Take 1 tablet by mouth at bedtime as needed for constipation.    [provider]  simethicone (MYLICON) 712 MG chewable tablet Chew 125 mg by mouth every 6 (six) hours as needed for flatulence.    [provider]  tamsulosin (FLOMAX) 0.4 MG CAPS capsule Take 0.4 mg by mouth at bedtime.    [provider]  traZODone (DESYREL) 100 MG tablet Take 1 tablet (100 mg total) by mouth at bedtime. 06/05/16   Domenic Polite, MD  Venlafaxine HCl 75 MG TB24 Take 225 mg by mouth in the morning. 08/19/19   [provider]  vitamin E 180 MG (400 UNITS) capsule Take 400 Units by mouth daily.    [provider]      Allergies    Patient has no known allergies.    Review of Systems   Review of Systems  Unable to perform ROS: Mental status change   Physical Exam Updated Vital Signs BP (!) 175/88    Pulse 85    Temp 98.7 F (37.1 C) (Rectal)    Resp 18    SpO2 92%  Physical Exam Vitals and nursing note reviewed.  Constitutional:      General: He is not in acute distress.    Appearance: He is not ill-appearing.     Comments: No acute distress, unkept, not malnourished  HENT:     Head: Normocephalic and atraumatic.     Comments: No deformity to head present,  no raccoon eyes or battle sign noted.    Nose: No congestion.     Mouth/Throat:     Mouth: Mucous membranes are moist.     Pharynx: Oropharynx is clear.  Eyes:     Extraocular Movements: Extraocular movements intact.     Conjunctiva/sclera: Conjunctivae normal.     Pupils: Pupils are equal, round, and reactive to light.  Cardiovascular:     Rate and Rhythm: Normal rate and regular rhythm.     Pulses: Normal pulses.     Heart sounds: No murmur heard.   No friction rub. No gallop.  Pulmonary:     Effort: No respiratory distress.     Breath sounds: No wheezing, rhonchi or rales.  Abdominal:     Palpations: Abdomen is soft.     Tenderness: There is no abdominal tenderness. There is no right CVA tenderness or left CVA tenderness.  Musculoskeletal:     Comments: Moving all 4 extremities.  Spine was palpated was nontender to palpation no step-off deformities noted.  Skin:    General: Skin is warm and dry.  Neurological:     Mental Status: He is alert.     GCS: GCS eye subscore is 4. GCS verbal subscore is 5. GCS motor subscore is 6.     Comments: Patient is oriented to self, able to follow simple commands, cranial nerves II through XII grossly intact, no unilateral weakness, no gait disturbances.  Psychiatric:        Mood and Affect: Mood normal.    ED Results / Procedures / Treatments   Labs (all labs ordered  are listed, but only abnormal results are displayed) Labs Reviewed  COMPREHENSIVE METABOLIC PANEL - Abnormal; Notable for the following components:      Result Value   Potassium 3.4 (*)    CO2 19 (*)    Glucose, Bld 103 (*)    BUN 24 (*)    Total Bilirubin 1.3 (*)    All other components within normal limits  CBC WITH DIFFERENTIAL/PLATELET - Abnormal; Notable for the following components:   WBC 11.1 (*)    RDW 16.1 (*)    Neutro Abs 8.9 (*)    All other components within normal limits  BRAIN NATRIURETIC PEPTIDE - Abnormal; Notable for the following components:   B Natriuretic Peptide 334.2 (*)    All other components within normal limits  URINALYSIS, ROUTINE W REFLEX MICROSCOPIC - Abnormal; Notable for the following components:   Color, Urine AMBER (*)    Hgb urine dipstick MODERATE (*)    Ketones, ur 20 (*)    Protein, ur >=300 (*)    All other components within normal limits  RAPID URINE DRUG SCREEN, HOSP PERFORMED - Abnormal; Notable for the following components:   Benzodiazepines POSITIVE (*)    All other components within normal limits  ETHANOL  LACTIC ACID, PLASMA  AMMONIA    EKG EKG Interpretation  Date/Time:  Monday May 02 2021 15:40:45 EST Ventricular Rate:  79 PR Interval:  201 QRS Duration: 168 QT Interval:  449 QTC Calculation: 515 R Axis:   -64 Text Interpretation: Sinus rhythm Multiple premature complexes, vent & supraven Probable left atrial enlargement Right bundle branch block No significant change since last tracing Confirmed by Regan Lemming ([id number]) on 04/27/2021 4:18:27 PM  Radiology CT Head Wo Contrast  Result Date: 04/24/2021 CLINICAL DATA:  Mental status change. EXAM: CT HEAD WITHOUT CONTRAST TECHNIQUE: Contiguous axial images were obtained from the base of the skull  through the vertex without intravenous contrast. RADIATION DOSE REDUCTION: This exam was performed according to the departmental dose-optimization program which includes automated  exposure control, adjustment of the mA and/or kV according to patient size and/or use of iterative reconstruction technique. COMPARISON:  May 31, 2016 FINDINGS: Brain: No evidence of acute infarction, hemorrhage, hydrocephalus, extra-axial collection or mass lesion/mass effect. There is chronic diffuse atrophy. Mild chronic bilateral periventricular white matter small vessel ischemic changes are identified. Vascular: No hyperdense vessel is noted. Skull: Normal. Negative for fracture or focal lesion. Sinuses/Orbits: No acute finding. Other: None. IMPRESSION: 1. No acute intracranial abnormality identified. 2. Chronic diffuse atrophy. 3. Mild chronic bilateral periventricular white matter small vessel ischemic changes. Electronically Signed   By: Abelardo Diesel M.D.   On: 04/28/2021 17:04   DG Chest Port 1 View  Result Date: 05/08/2021 CLINICAL DATA:  Rales in bilateral lung bases. EXAM: PORTABLE CHEST 1 VIEW COMPARISON:  October 05, 2020 FINDINGS: The heart size and mediastinal contours are stable. Heart size is enlarged. Chronic increased interstitial markings are identified throughout the right lung and left lung base unchanged. No pleural effusion or focal pneumonia is noted. The visualized skeletal structures are stable. IMPRESSION: No acute cardiopulmonary disease identified. Chronic changes of bilateral lungs stable compared prior exam. Electronically Signed   By: Abelardo Diesel M.D.   On: 04/16/2021 16:11    Procedures Procedures    Medications Ordered in ED Medications  LORazepam (ATIVAN) injection 0.5 mg (0.5 mg Intravenous Given 04/26/2021 1742)  naloxone [facility]) injection 0.4 mg (0.4 mg Intravenous Given 05/05/2021 1758)  sodium chloride 0.9 % bolus 1,000 mL (1,000 mLs Intravenous New Bag/Given 04/28/2021 1803)    ED Course/ Medical Decision Making/ A&P                           Medical Decision Making Amount and/or Complexity of Data Reviewed Labs: ordered. Radiology:  ordered.  Risk Prescription drug management. Decision regarding hospitalization.   This patient presents to the ED for concern of altered mental status, this involves an extensive number of treatment options, and is a complaint that carries with it a high risk of complications and morbidity.  The differential diagnosis includes CVA, metabolic abnormality, sepsis    Additional history obtained:  Additional history obtained from daughter who is at bedside External records from outside source obtained and reviewed including please see HPI for further detail   Co morbidities that complicate the patient evaluation  Chronic pain, polypharmacy  Social Determinants of Health:  Age    Lab Tests:  I Ordered, and personally interpreted labs.  The pertinent results include: CBC shows slight leukocytosis of 11.1, CMP shows potassium 3.4 CO2 of 19, glucose 103 BUN of 24 total T. bili 1.3, lactic 1.2 UA negative for nitrates leukocytes or hematuria, rapid urine drug seen positive for benzos, ethanol negative   Imaging Studies ordered:  I ordered imaging studies including CT head, portable chest I independently visualized and interpreted imaging which showed both negative for acute findings I agree with the radiologist interpretation   Cardiac Monitoring:  The patient was maintained on a cardiac monitor.  I personally viewed and interpreted the cardiac monitored which showed an underlying rhythm of: EKG sinus without signs of ischemia   Medicines ordered and prescription drug management:  I ordered medication including fluids, Narcan for dehydration and confusion I have reviewed the patients home medicines and have made adjustments as needed   Reevaluation:  On arrival patient was disoriented, obtain rectal temperature which was unremarkable.  We will provide him with fluids and continue to monitor.  Patient was reassessed still continue to be altered but is ambulating around  the room without difficulty, UA is negative for signs of infection, CT head as well as chest x-ray are also unremarkable.  Rapid urine drug June does show benzos I suspect could be the cause of his altered mental status will provide him with Narcan and reassess  Patient is reassessed after Narcan but still is altered, will recommend admission daughter who is at bedside agree with this plan will consult with hospitalist team.  Consultations Obtained:  I requested consultation with hospitalist Dr. Posey Pronto,  and discussed lab and imaging findings as well as pertinent plan - they recommend: Will admit the patient.    Test Considered:  CT abdomen pelvis but will defer as abdomen is soft nontender to palpation, no nausea or vomiting, passing gas and having bowel movements, he is afebrile nontachycardic low suspicion for intra-abdominal abnormality at this time.    Rule out Low suspicion for systemic infection patient is nontoxic-appearing does not meet sepsis or SIRS criteria.  I have low suspicion for CVA or intra head bleed as there is no focal deficits on my exam, CT head is negative for acute findings.  He is noted to be altered but he is not slurring his words but not having difficulty with word finding, he just has confusion without any other focal deficits atypical of CVA.  Low suspicion for meningitis as he has no meningeal sign afebrile.  I have low suspicion for metabolic abnormality as chemistry without any abnormalities.    Dispostion and problem list  After consideration of the diagnostic results and the patients response to treatment, I feel that the patent would benefit from admission.  Acute encephalopathy-unclear etiology but due to to his extensive medication use fentanyl, clonazepam I suspect polypharmacy will need further monitoring.            Final Clinical Impression(s) / ED Diagnoses Final diagnoses:  Encephalopathy    Rx / DC Orders ED Discharge Orders      None         Marcello Fennel, PA-C 05/08/2021 1851    Daleen Bo, MD 04/29/2021 2053

## 2021-05-02 NOTE — ED Triage Notes (Signed)
Pt bib [facility] from home, lives by himself. Sister called EMS b/c pt was altered. LKW at 1800 05/01/21. Unsure of how compliant pt has been with medication at home.   EMS Vitals 170/70 BP 77 HR 27 RR 90 RA -> 96% 5L [place] 126 CBG 150cc NS

## 2021-05-02 NOTE — Assessment & Plan Note (Addendum)
Suspect multifactorial from polypharmacy and dehydration from poor oral intake on the background of presumed cognitive impairment and/or undiagnosed dementia. -Continue gentle IV fluid hydration overnight -Hold potentially inciting meds for now (Klonopin, Robaxin, Seroquel, trazodone, venlafaxine) -CT head negative for acute abnormality -Urinalysis and CXR without evidence of infectious process, COVID/flu panel pending -PT/OT eval

## 2021-05-02 NOTE — Assessment & Plan Note (Signed)
Resume home meds when confirmed by pharmacy.

## 2021-05-02 NOTE — ED Notes (Signed)
Patient transported to CT 

## 2021-05-02 NOTE — H&P (Signed)
History and Physical    Michael Lutz NTZ:[id number] DOB: 1945/04/02 DOA: 04/27/2021  PCP: Kathyrn Lass, MD  Patient coming from: Home via EMS  I have personally briefly reviewed patient's old medical records in [place]  Chief Complaint: Altered mental status  HPI: Michael Lutz is a 76 y.o. male with medical history significant for HTN, HLD, PVD, AAA s/p EVAR, prostate cancer, bipolar disorder with anxiety on chronic Klonopin, chronic back pain using fentanyl patch, memory issues who presented to the ED for evaluation of altered mental status.  History limited from patient otherwise supplemented by patient's sister at bedside.  Per patient's sister, patient lives alone and does have chronic memory issues although at baseline is fully alert and oriented, lucid, and answers questions appropriately.  Last night (2/19) he was noted to be confused and not speaking appropriately.  They noted that he had not been eating well last few days.  They tried to encourage oral hydration.  They checked back in on him this morning and he was still very confused.  He had his medication pills scattered all over the floor.  He was noted to be lethargic and not answering questions appropriately.  ED Course   Labs/Imaging on admission: I have personally reviewed following labs and imaging studies.  Initial vitals showed BP 179/106, pulse 77, RR 15, temp 98.7 F, SPO2 97% on room air.  Labs showed WBC 11.1, hemoglobin 16.2, platelets 153,000, sodium 139, potassium 3.4, bicarb 19, BUN 24, creatinine 1.13, serum glucose 103, LFTs within normal limits, BNP 334.2, lactic acid 1.2, serum ethanol <10, ammonia 35.  Urinalysis negative for UTI.  UDS positive for benzodiazepines.  Portable chest x-ray shows chronic increased interstitial markings throughout the right lung and left lung base without acute cardiopulmonary disease.  CT head without contrast is negative for acute intercranial abnormality.  Chronic  diffuse atrophy and mild chronic bilateral periventricular white matter small vessel ischemic changes noted.  Patient was given 1 L normal saline, IV Narcan 0.4 mg, IV Ativan 0.5 mg for sedation prior to CT scan.  The hospitalist service was consulted to admit for further evaluation and management.  Review of Systems: All systems reviewed and are negative except as documented in history of present illness above.   Past Medical History:  Diagnosis Date   AAA (abdominal aortic aneurysm) ([place])    ADD (attention deficit disorder)    Anxiety    takes Clonazepam daily   Arthritis    left knee   Bipolar 1 disorder (HCC)    takes Seroquel nightly   Bipolar affective disorder ([place])    in remission on meds   Blood transfusion    several times   CHF (congestive heart failure) (HCC)    Chronic back pain    [facility]   Chronic back pain    scoliosis   Chronic low back pain 07/31/2013   Colon polyps    Dr Cristina Gong does a colonoscopy every 5 years   Constipation    takes Colace prn   Depression    takes Effexor daily   Dysrhythmia    palpitations- sees dr Irish Lack   Enlarged prostate    slightly   Full dentures    GERD (gastroesophageal reflux disease)    takes Zantac daily   Heart murmur    arrythmia   History of shingles    Hx of scabies Mid Feb 2013   Hx of seasonal allergies    Hyperlipidemia  takes Niacin daily and Lipitor daily   Hypertension    takes Amlodipine and Quinapril daily   Insomnia    takes Trazodone nightly   Joint pain    Leg pain 05-04-10   with walking   Memory loss    short and long term   Nocturia    Peripheral edema    takes Furosemide prn   Peripheral vascular disease (HCC)    s/p AAA stent   Prostate cancer ([place])    Urinary urgency    Wears glasses     Past Surgical History:  Procedure Laterality Date   ABDOMINAL AORTIC ANEURYSM REPAIR  11/2009   Stent graft repair   BACK SURGERY     thoracic spine   CARDIAC  CATHETERIZATION     15 yrs ago   CHOLECYSTECTOMY     COLONOSCOPY     FOOT SURGERY     Multiple surgeries on right foot   GANGLION CYST EXCISION Right 06/07/2012   Procedure: DISTAL POLE EXCISION RIGHT WRIST;  Surgeon: Wynonia Sours, MD;  Location: [facility];  Service: Orthopedics;  Laterality: Right;   GOLD SEED IMPLANT N/A 09/08/2019   Procedure: GOLD SEED IMPLANT;  Surgeon: Franchot Gallo, MD;  Location: [facility];  Service: Urology;  Laterality: N/A;  30 MINS   LOWER EXTREMITY ANGIOGRAPHY Right 09/15/2020   Procedure: Lower Extremity Angiography;  Surgeon: Cherre Robins, MD;  Location: [facility];  Service: Cardiovascular;  Laterality: Right;   SHOULDER SURGERY     Right shoulder   SPACE OAR INSTILLATION N/A 09/08/2019   Procedure: SPACE OAR INSTILLATION;  Surgeon: Franchot Gallo, MD;  Location: [facility];  Service: Urology;  Laterality: N/A;   SPINAL CORD STIMULATOR IMPLANT  2009   NOVI [id number]  MRI UNSAFE   SPINAL FUSION  04/04/2011   Procedure: FUSION POSTERIOR SPINAL MULTILEVEL/SCOLIOSIS;  Surgeon: Gunnar Bulla, MD;  Location: [facility];  Service: Orthopedics;  Laterality: N/A;  repair thoracic and lumbar  pseudoarthrosis, revise spinal cord stimulator, extend thoracic fusion, iliac crest bone graft   SPINE SURGERY  12/2009   Spinal Fusion by Dr. Marcial Pacas   TESTICLE SURGERY     right testicle   TONSILLECTOMY     TURP VAPORIZATION     VASCULAR SURGERY     AAA stent repair    Social History:  reports that he quit smoking about 7 years ago. His smoking use included cigarettes. He has a 50.00 pack-year smoking history. He has never used smokeless tobacco. He reports that he does not drink alcohol and does not use drugs.  No Known Allergies  Family History  Problem Relation Age of Onset   Melanoma Mother    Diabetes Father    Heart attack Father    Prostate cancer Father    Arthritis Sister        rheumatoid   Breast cancer Sister    Breast cancer  Maternal Grandmother    Anesthesia problems Neg Hx    Hypotension Neg Hx    Malignant hyperthermia Neg Hx    Pseudochol deficiency Neg Hx    Pancreatic cancer Neg Hx    Colon cancer Neg Hx      Prior to Admission medications   Medication Sig Start Date End Date Taking? Authorizing Provider  acetaminophen (TYLENOL) 500 MG tablet Take 1,000 mg by mouth every 6 (six) hours as needed (for pain.).    [provider]  amLODipine (NORVASC) 10 MG tablet Take  10 mg by mouth in the morning. 01/06/16   [provider]  Ascorbic Acid (VITAMIN C) 1000 MG tablet Take 1,000 mg by mouth in the morning.    [provider]  aspirin 325 MG tablet Take 162.5 mg by mouth in the morning.    [provider]  atorvastatin (LIPITOR) 80 MG tablet Take 80 mg by mouth in the morning. 08/11/20   [provider]  b complex vitamins tablet Take 1 tablet by mouth in the morning.    [provider]  buPROPion (WELLBUTRIN XL) 150 MG 24 hr tablet Take 450 mg by mouth in the morning. 07/28/19   [provider]  Calcium Carbonate (CALCIUM 600 PO) Take 2 tablets by mouth in the morning.    [provider]  cholecalciferol (VITAMIN D) 1000 UNITS tablet Take 1,000 Units by mouth in the morning.    [provider]  clonazePAM (KLONOPIN) 1 MG tablet Take 1-2 mg by mouth See admin instructions. Take 1 tablet (1 mg) by mouth daily if needed for anxiety & take 2 tablets (2 mg) by mouth scheduled at bedtime. 08/18/19   [provider]  diclofenac Sodium (VOLTAREN) 1 % GEL Apply 1 application topically daily as needed (pain). 02/25/20   [provider]  diphenhydrAMINE (BENADRYL) 25 MG tablet Take 25 mg by mouth every 6 (six) hours as needed for allergies.    [provider]  fentaNYL (DURAGESIC) 75 MCG/HR Place 1 patch onto the skin every other day. 08/19/19   [provider]  furosemide (LASIX) 40 MG tablet Take 40-80 mg by  mouth daily as needed (fluid retention.).    [provider]  gabapentin (NEURONTIN) 300 MG capsule Take 600 mg by mouth in the morning. 08/27/19   [provider]  ibuprofen (ADVIL,MOTRIN) 200 MG tablet Take 400 mg by mouth every 8 (eight) hours as needed (pain.).    [provider]  MAGNESIUM-ZINC PO Take 1 tablet by mouth in the morning.    [provider]  methocarbamol (ROBAXIN) 750 MG tablet Take 750 mg by mouth every 8 (eight) hours. 08/19/19   [provider]  mirabegron ER (MYRBETRIQ) 25 MG TB24 tablet Take 25 mg by mouth in the morning.    [provider]  Multiple Vitamin (MULTIVITAMIN WITH MINERALS) TABS tablet Take 1 tablet by mouth in the morning.    [provider]  nystatin (MYCOSTATIN/NYSTOP) powder Apply 1 application topically 2 (two) times daily as needed (skin irritation/rash.).  08/19/19   [provider]  Omega-3 Fatty Acids (FISH OIL PO) Take 2,000 mg by mouth in the morning.    [provider]  Phenazopyridine HCl 99.5 MG TABS Take 2 tablets by mouth daily as needed (urinary pain).    [provider]  polyethylene glycol powder (GLYCOLAX/MIRALAX) 17 GM/SCOOP powder Take 17 g by mouth daily as needed for mild constipation.    [provider]  Potassium 99 MG TABS Take 99 mg by mouth every evening. 10/14/08   [provider]  pregabalin (LYRICA) 150 MG capsule Take 300 mg by mouth at bedtime. 08/19/19   [provider]  pseudoephedrine (SUDAFED) 30 MG tablet Take 60 mg by mouth every 8 (eight) hours as needed for congestion.    [provider]  QUEtiapine (SEROQUEL) 100 MG tablet Take 1 tablet (100 mg total) by mouth at bedtime. 06/05/16   Domenic Polite, MD  quinapril (ACCUPRIL) 40 MG tablet Take 80 mg by  mouth in the morning.    [provider]  RESTASIS 0.05 % ophthalmic emulsion Place 1 drop into both eyes 2 (two) times daily as needed (dry/irritated  eyes.). 02/11/16   [provider]  sennosides-docusate sodium (SENOKOT-S) 8.6-50 MG tablet Take 1 tablet by mouth at bedtime as needed for constipation.    [provider]  simethicone (MYLICON) 195 MG chewable tablet Chew 125 mg by mouth every 6 (six) hours as needed for flatulence.    [provider]  tamsulosin (FLOMAX) 0.4 MG CAPS capsule Take 0.4 mg by mouth at bedtime.    [provider]  traZODone (DESYREL) 100 MG tablet Take 1 tablet (100 mg total) by mouth at bedtime. 06/05/16   Domenic Polite, MD  Venlafaxine HCl 75 MG TB24 Take 225 mg by mouth in the morning. 08/19/19   [provider]  vitamin E 180 MG (400 UNITS) capsule Take 400 Units by mouth daily.    [provider]    Physical Exam: Vitals:   05/01/2021 1730 04/25/2021 1803 04/23/2021 1815 04/26/2021 2014  BP: (!) 165/101 (!) 143/99 (!) 175/88 (!) 173/83  Pulse: 86 90 85 85  Resp: (!) 27 19 18 20   Temp:    98 F (36.7 C)  TempSrc:    Oral  SpO2: 92% 95% 92% 97%  Weight:    119.9 kg   Constitutional: Elderly man resting in bed, easily distracted and pulling at his pulse ox and blood pressure cuff Eyes: PERRL, lids and conjunctivae normal ENMT: Mucous membranes are dry. Posterior pharynx clear of any exudate or lesions. Neck: normal, supple, no masses. Respiratory: clear to auscultation bilaterally, no wheezing, no crackles. Normal respiratory effort. No accessory muscle use.  Cardiovascular: Regular rate and rhythm, no murmurs / rubs / gallops. No extremity edema. 2+ pedal pulses. Abdomen: no tenderness, no masses palpated. No hepatosplenomegaly.  Musculoskeletal: no clubbing / cyanosis. No joint deformity upper and lower extremities. Good ROM, no contractures. Normal muscle tone.  Skin: no rashes, lesions, ulcers. No induration Neurologic: CN 2-12 grossly intact. Sensation intact. Strength 5/5 in all 4.  Psychiatric: Awake, alert, oriented to self.  Recognizes sister at  bedside but does not recall her name.  Not oriented to time or situation.  EKG: Personally reviewed. Sinus rhythm with PVCs/PACs, RBBB.  PVCs/PACs new when compared to prior.  Assessment/Plan Principal Problem:   Acute encephalopathy Active Problems:   Bipolar affective disorder (HCC)   Benign hypertension   Chronic pain syndrome   Pure hypercholesterolemia   At risk for delirium   Michael Lutz is a 76 y.o. male with medical history significant for HTN, HLD, PVD, AAA s/p EVAR, prostate cancer, bipolar disorder with anxiety on chronic Klonopin, chronic back pain using fentanyl patch, memory issues who is admitted with acute encephalopathy presumed due to polypharmacy +/- dehydration on background of presumed cognitive impairment/dementia.  Assessment and Plan: * Acute encephalopathy- (present on admission) Suspect multifactorial from polypharmacy and dehydration from poor oral intake on the background of presumed cognitive impairment and/or undiagnosed dementia. -Continue gentle IV fluid hydration overnight -Hold potentially inciting meds for now (Klonopin, Robaxin, Seroquel, trazodone, venlafaxine) -CT head negative for acute abnormality -Urinalysis and CXR without evidence of infectious process, COVID/flu panel pending -PT/OT eval  Bipolar affective disorder ([place])- (present on admission) Resume home meds once mental status improves.  Benign hypertension- (present on admission) Resume home meds when confirmed by pharmacy.  Chronic pain syndrome- (present on admission) Uses fentanyl patch chronically,  resume when mental status more clear.  Pure hypercholesterolemia- (present on admission) Resume statin pending medication reconciliation.  At risk for delirium Family reports chronic issues with memory impairment and have noted that he has been having increased difficulty taking his medications, eating, losing his keys, and other daily activities.  They are concerned about  underlying dementia.  Place on delirium precautions while in hospital.  DVT prophylaxis: enoxaparin (LOVENOX) injection 40 mg Start: 04/25/2021 1945 Code Status: Full code Family Communication: Discussed with patient's sister at bedside Disposition Plan: From home, dispo pending clinical progress Consults called: None Severity of Illness: The appropriate patient status for this patient is OBSERVATION. Observation status is judged to be reasonable and necessary in order to provide the required intensity of service to ensure the patient's safety. The patient's presenting symptoms, physical exam findings, and initial radiographic and laboratory data in the context of their medical condition is felt to place them at decreased risk for further clinical deterioration. Furthermore, it is anticipated that the patient will be medically stable for discharge from the hospital within 2 midnights of admission.   Zada Finders MD [facility]  If 7PM-7AM, please contact night-coverage [email or web address]  04/18/2021, 8:44 PM

## 2021-05-03 DIAGNOSIS — Z9689 Presence of other specified functional implants: Secondary | ICD-10-CM | POA: Diagnosis not present

## 2021-05-03 DIAGNOSIS — F0393 Unspecified dementia, unspecified severity, with mood disturbance: Secondary | ICD-10-CM | POA: Diagnosis present

## 2021-05-03 DIAGNOSIS — I4891 Unspecified atrial fibrillation: Secondary | ICD-10-CM | POA: Diagnosis present

## 2021-05-03 DIAGNOSIS — J9811 Atelectasis: Secondary | ICD-10-CM | POA: Diagnosis not present

## 2021-05-03 DIAGNOSIS — J9601 Acute respiratory failure with hypoxia: Secondary | ICD-10-CM | POA: Diagnosis not present

## 2021-05-03 DIAGNOSIS — G9341 Metabolic encephalopathy: Secondary | ICD-10-CM | POA: Diagnosis not present

## 2021-05-03 DIAGNOSIS — J95811 Postprocedural pneumothorax: Secondary | ICD-10-CM | POA: Diagnosis not present

## 2021-05-03 DIAGNOSIS — I5032 Chronic diastolic (congestive) heart failure: Secondary | ICD-10-CM | POA: Diagnosis present

## 2021-05-03 DIAGNOSIS — Z9189 Other specified personal risk factors, not elsewhere classified: Secondary | ICD-10-CM | POA: Diagnosis not present

## 2021-05-03 DIAGNOSIS — F310 Bipolar disorder, current episode hypomanic: Secondary | ICD-10-CM

## 2021-05-03 DIAGNOSIS — R0603 Acute respiratory distress: Secondary | ICD-10-CM | POA: Diagnosis not present

## 2021-05-03 DIAGNOSIS — G894 Chronic pain syndrome: Secondary | ICD-10-CM

## 2021-05-03 DIAGNOSIS — I517 Cardiomegaly: Secondary | ICD-10-CM | POA: Diagnosis not present

## 2021-05-03 DIAGNOSIS — B372 Candidiasis of skin and nail: Secondary | ICD-10-CM | POA: Diagnosis present

## 2021-05-03 DIAGNOSIS — I110 Hypertensive heart disease with heart failure: Secondary | ICD-10-CM | POA: Diagnosis present

## 2021-05-03 DIAGNOSIS — T40415A Adverse effect of fentanyl or fentanyl analogs, initial encounter: Secondary | ICD-10-CM | POA: Diagnosis present

## 2021-05-03 DIAGNOSIS — Z20822 Contact with and (suspected) exposure to covid-19: Secondary | ICD-10-CM | POA: Diagnosis present

## 2021-05-03 DIAGNOSIS — Z515 Encounter for palliative care: Secondary | ICD-10-CM | POA: Diagnosis not present

## 2021-05-03 DIAGNOSIS — Z6841 Body Mass Index (BMI) 40.0 and over, adult: Secondary | ICD-10-CM | POA: Diagnosis not present

## 2021-05-03 DIAGNOSIS — G928 Other toxic encephalopathy: Secondary | ICD-10-CM | POA: Diagnosis present

## 2021-05-03 DIAGNOSIS — E870 Hyperosmolality and hypernatremia: Secondary | ICD-10-CM | POA: Diagnosis present

## 2021-05-03 DIAGNOSIS — F319 Bipolar disorder, unspecified: Secondary | ICD-10-CM | POA: Diagnosis present

## 2021-05-03 DIAGNOSIS — R918 Other nonspecific abnormal finding of lung field: Secondary | ICD-10-CM | POA: Diagnosis not present

## 2021-05-03 DIAGNOSIS — Z4682 Encounter for fitting and adjustment of non-vascular catheter: Secondary | ICD-10-CM | POA: Diagnosis not present

## 2021-05-03 DIAGNOSIS — F13239 Sedative, hypnotic or anxiolytic dependence with withdrawal, unspecified: Secondary | ICD-10-CM | POA: Diagnosis present

## 2021-05-03 DIAGNOSIS — I10 Essential (primary) hypertension: Secondary | ICD-10-CM | POA: Diagnosis not present

## 2021-05-03 DIAGNOSIS — Z66 Do not resuscitate: Secondary | ICD-10-CM | POA: Diagnosis not present

## 2021-05-03 DIAGNOSIS — J939 Pneumothorax, unspecified: Secondary | ICD-10-CM | POA: Diagnosis not present

## 2021-05-03 DIAGNOSIS — J690 Pneumonitis due to inhalation of food and vomit: Secondary | ICD-10-CM | POA: Diagnosis not present

## 2021-05-03 DIAGNOSIS — G9340 Encephalopathy, unspecified: Secondary | ICD-10-CM | POA: Diagnosis not present

## 2021-05-03 DIAGNOSIS — R55 Syncope and collapse: Secondary | ICD-10-CM | POA: Diagnosis not present

## 2021-05-03 DIAGNOSIS — F1123 Opioid dependence with withdrawal: Secondary | ICD-10-CM | POA: Diagnosis present

## 2021-05-03 DIAGNOSIS — Y658 Other specified misadventures during surgical and medical care: Secondary | ICD-10-CM | POA: Diagnosis not present

## 2021-05-03 DIAGNOSIS — J9383 Other pneumothorax: Secondary | ICD-10-CM | POA: Diagnosis not present

## 2021-05-03 DIAGNOSIS — E7800 Pure hypercholesterolemia, unspecified: Secondary | ICD-10-CM

## 2021-05-03 DIAGNOSIS — Z981 Arthrodesis status: Secondary | ICD-10-CM | POA: Diagnosis not present

## 2021-05-03 DIAGNOSIS — I739 Peripheral vascular disease, unspecified: Secondary | ICD-10-CM | POA: Diagnosis present

## 2021-05-03 DIAGNOSIS — R4182 Altered mental status, unspecified: Secondary | ICD-10-CM | POA: Diagnosis present

## 2021-05-03 DIAGNOSIS — F0394 Unspecified dementia, unspecified severity, with anxiety: Secondary | ICD-10-CM | POA: Diagnosis present

## 2021-05-03 DIAGNOSIS — N179 Acute kidney failure, unspecified: Secondary | ICD-10-CM | POA: Diagnosis present

## 2021-05-03 LAB — CBC
HCT: 48.7 % (ref 39.0–52.0)
Hemoglobin: 16.1 g/dL (ref 13.0–17.0)
MCH: 28.1 pg (ref 26.0–34.0)
MCHC: 33.1 g/dL (ref 30.0–36.0)
MCV: 85 fL (ref 80.0–100.0)
Platelets: 155 10*3/uL (ref 150–400)
RBC: 5.73 MIL/uL (ref 4.22–5.81)
RDW: 15.9 % — ABNORMAL HIGH (ref 11.5–15.5)
WBC: 13.8 10*3/uL — ABNORMAL HIGH (ref 4.0–10.5)
nRBC: 0 % (ref 0.0–0.2)

## 2021-05-03 LAB — HEMOGLOBIN A1C
Hgb A1c MFr Bld: 5.3 % (ref 4.8–5.6)
Mean Plasma Glucose: 105 mg/dL

## 2021-05-03 LAB — BASIC METABOLIC PANEL
Anion gap: 12 (ref 5–15)
BUN: 21 mg/dL (ref 8–23)
CO2: 22 mmol/L (ref 22–32)
Calcium: 8.8 mg/dL — ABNORMAL LOW (ref 8.9–10.3)
Chloride: 107 mmol/L (ref 98–111)
Creatinine, Ser: 1.12 mg/dL (ref 0.61–1.24)
GFR, Estimated: >60 mL/min (ref >60)
Glucose, Bld: 81 mg/dL (ref 70–99)
Potassium: 3.1 mmol/L — ABNORMAL LOW (ref 3.5–5.1)
Sodium: 141 mmol/L (ref 135–145)

## 2021-05-03 LAB — LACTIC ACID, PLASMA
Lactic Acid, Venous: 1.3 mmol/L (ref 0.5–1.9)
Lactic Acid, Venous: 1.2 mmol/L (ref 0.5–1.9)

## 2021-05-03 LAB — VITAMIN B12: Vitamin B-12: 488 pg/mL (ref 180–914)

## 2021-05-03 LAB — PROCALCITONIN: Procalcitonin: <0.1 ng/mL

## 2021-05-03 LAB — MAGNESIUM: Magnesium: 1.7 mg/dL (ref 1.7–2.4)

## 2021-05-03 MED ORDER — ADULT MULTIVITAMIN W/MINERALS CH: 1.0000 | ORAL_TABLET | Freq: Every day | ORAL | Status: DC

## 2021-05-03 MED ORDER — DEXTROSE-NACL 5-0.9 % IV SOLN: INTRAVENOUS | Status: DC

## 2021-05-03 MED ORDER — METOPROLOL TARTRATE 5 MG/5ML IV SOLN
5.0000 mg | Freq: Three times a day (TID) | INTRAVENOUS | Status: DC
  Administered 2021-05-03 – 2021-05-06 (×11): 5 mg via INTRAVENOUS
  Filled 2021-05-03 (×11): qty 5

## 2021-05-03 MED ORDER — HYDRALAZINE HCL 20 MG/ML IJ SOLN
5.0000 mg | Freq: Three times a day (TID) | INTRAMUSCULAR | Status: DC
  Administered 2021-05-03 – 2021-05-06 (×11): 5 mg via INTRAVENOUS
  Filled 2021-05-03 (×11): qty 1

## 2021-05-03 MED ORDER — THIAMINE HCL 100 MG/ML IJ SOLN: 100.0000 mg | Freq: Every day | INTRAMUSCULAR | Status: DC

## 2021-05-03 MED ORDER — LORAZEPAM 2 MG/ML IJ SOLN
0.0000 mg | Freq: Three times a day (TID) | INTRAMUSCULAR | Status: DC
  Administered 2021-05-05 (×2): 2 mg via INTRAVENOUS
  Filled 2021-05-03 (×3): qty 1

## 2021-05-03 MED ORDER — LORAZEPAM 2 MG/ML IJ SOLN
0.0000 mg | INTRAMUSCULAR | Status: AC
  Administered 2021-05-03 – 2021-05-04 (×3): 2 mg via INTRAVENOUS
  Administered 2021-05-04: 3 mg via INTRAVENOUS
  Administered 2021-05-04 – 2021-05-05 (×8): 2 mg via INTRAVENOUS
  Filled 2021-05-03 (×12): qty 1

## 2021-05-03 MED ORDER — CLOTRIMAZOLE 1 % EX CREA
TOPICAL_CREAM | Freq: Two times a day (BID) | CUTANEOUS | Status: DC
  Filled 2021-05-03 (×2): qty 15

## 2021-05-03 MED ORDER — POTASSIUM CHLORIDE 10 MEQ/100ML IV SOLN
10.0000 meq | INTRAVENOUS | Status: AC
  Administered 2021-05-03 (×5): 10 meq via INTRAVENOUS
  Filled 2021-05-03 (×5): qty 100

## 2021-05-03 MED ORDER — MORPHINE SULFATE (PF) 2 MG/ML IV SOLN
0.5000 mg | INTRAVENOUS | Status: DC | PRN
  Administered 2021-05-03 – 2021-05-06 (×10): 1 mg via INTRAVENOUS
  Filled 2021-05-03 (×10): qty 1

## 2021-05-03 MED ORDER — TAMSULOSIN HCL 0.4 MG PO CAPS
0.4000 mg | ORAL_CAPSULE | Freq: Every day | ORAL | Status: DC
  Filled 2021-05-03 (×3): qty 1

## 2021-05-03 MED ORDER — FOLIC ACID 1 MG PO TABS: 1.0000 mg | ORAL_TABLET | Freq: Every day | ORAL | Status: DC

## 2021-05-03 MED ORDER — LORAZEPAM 2 MG/ML IJ SOLN
1.0000 mg | INTRAMUSCULAR | Status: AC | PRN
  Administered 2021-05-03 (×2): 2 mg via INTRAVENOUS
  Filled 2021-05-03 (×4): qty 1

## 2021-05-03 MED ORDER — LORAZEPAM 1 MG PO TABS: 1.0000 mg | ORAL_TABLET | ORAL | Status: DC | PRN

## 2021-05-03 MED ORDER — POTASSIUM CHLORIDE CRYS ER 10 MEQ PO TBCR: 50.0000 meq | EXTENDED_RELEASE_TABLET | Freq: Once | ORAL | Status: DC

## 2021-05-03 MED ORDER — LORAZEPAM 1 MG PO TABS
1.0000 mg | ORAL_TABLET | ORAL | Status: AC | PRN
  Administered 2021-05-03: 4 mg via ORAL
  Filled 2021-05-03: qty 4

## 2021-05-03 MED ORDER — METHOCARBAMOL 1000 MG/10ML IJ SOLN
500.0000 mg | Freq: Two times a day (BID) | INTRAVENOUS | Status: DC | PRN
  Administered 2021-05-06: 500 mg via INTRAVENOUS
  Filled 2021-05-03: qty 5
  Filled 2021-05-03: qty 500

## 2021-05-03 MED ORDER — THIAMINE HCL 100 MG PO TABS: 100.0000 mg | ORAL_TABLET | Freq: Every day | ORAL | Status: DC

## 2021-05-03 MED ORDER — LORAZEPAM 2 MG/ML IJ SOLN
1.0000 mg | INTRAMUSCULAR | Status: DC | PRN
  Administered 2021-05-06: 2 mg via INTRAVENOUS
  Filled 2021-05-03 (×2): qty 1

## 2021-05-03 NOTE — Evaluation (Signed)
Physical Therapy Evaluation Patient Details Name: Michael Lutz MRN: [id number] DOB: 1945/10/17 Today's Date: 05/03/2021  History of Present Illness  Michael Lutz is a 76 y.o. male admitted 04/17/2021 with AMS. dx with acute encephalopathy. PMH includes HTN, HLD, PVD, AAA s/p EVAR, prostate cancer, bipolar disorder with anxiety on chronic Klonopin, chronic back pain using fentanyl patch, memory issues.  Clinical Impression   Pt admitted with above diagnosis. Lives at home alone, per sister, not managing well -- house unkept, unsafe to live in at time of admission; Prior to admission, pt was able to manage independently, nephew assist prn (per chart review), until decline in functional independence and safety lead to this admission; Presents to PT with AMS/confusion, generalized weakness, decr functional mobility, decr endurance;  Needs Max assist for bed mobility, Heavy mod assist of 2 for sit<>stand transfers, and heavy dependence on UEs and 2 person assist for pre-gait march in place; Hopeful that he will make a solid funcitonal recovery, and be able to participate well with therapies once AMS clears; Pt currently with functional limitations due to the deficits listed below (see PT Problem List). Pt will benefit from skilled PT to increase their independence and safety with mobility to allow discharge to the venue listed below.          Recommendations for follow up therapy are one component of a multi-disciplinary discharge planning process, led by the attending physician.  Recommendations may be updated based on patient status, additional functional criteria and insurance authorization.  Follow Up Recommendations Skilled nursing-short term rehab (<3 hours/day)    Assistance Recommended at Discharge Frequent or constant Supervision/Assistance  Patient can return home with the following  A lot of help with walking and/or transfers;A lot of help with bathing/dressing/bathroom;Two people to help  with bathing/dressing/bathroom;Assistance with Designer, multimedia (2 wheels);BSC/3in1  Recommendations for Other Services       Functional Status Assessment Patient has had a recent decline in their functional status and demonstrates the ability to make significant improvements in function in a reasonable and predictable amount of time.     Precautions / Restrictions Precautions Precautions: Fall Precaution Comments: AMS, restraints Restrictions Weight Bearing Restrictions: No      Mobility  Bed Mobility Overal bed mobility: Needs Assistance Bed Mobility: Supine to Sit, Sit to Supine     Supine to sit: Max assist, +2 for physical assistance, +2 for safety/equipment, HOB elevated Sit to supine: Min assist   General bed mobility comments: max A +2 due to decreased initiation, use of bed pad to assist, to come supine required multimodal cues and assist for all aspects. min A to return supine (max A +2 for repositioning once supine)    Transfers Overall transfer level: Needs assistance Equipment used: 2 person hand held assist Transfers: Sit to/from Stand Sit to Stand: Mod assist, +2 physical assistance, +2 safety/equipment           General transfer comment: MOd A +2 for multimodal cues for initiation and sequencing of standing, A for boost and balance    Ambulation/Gait             Pre-gait activities: March in place, sidesteps with heavy mod assist of 2    Stairs            Wheelchair Mobility    Modified Rankin (Stroke Patients Only)       Balance     Sitting balance-Leahy Scale: Fair Sitting balance - Comments:  able to sit EOB min guard Postural control: Posterior lean (initially - min guard after 30 seconds or so) Standing balance support: Bilateral upper extremity supported Standing balance-Leahy Scale: Poor                               Pertinent Vitals/Pain Pain Assessment Pain  Assessment: Faces Faces Pain Scale: Hurts little more Pain Location: L abdomen Pain Descriptors / Indicators: Grimacing, Moaning, Tender Pain Intervention(s): Limited activity within patient's tolerance    Home Living Family/patient expects to be discharged to:: Private residence Living Arrangements: Alone Available Help at Discharge: Family;Available PRN/intermittently Type of Home: House Home Access: Ramped entrance       Home Layout: One level Home Equipment: Cane - quad      Prior Function Prior Level of Function : Independent/Modified Independent             Mobility Comments: uses 4 point cane ADLs Comments: not managing ADL/IADL well at all, needs help but did not tell anyone he needed help     Hand Dominance   Dominant Hand: Left    Extremity/Trunk Assessment   Upper Extremity Assessment Upper Extremity Assessment: Defer to OT evaluation    Lower Extremity Assessment Lower Extremity Assessment: Generalized weakness    Cervical / Trunk Assessment Cervical / Trunk Assessment: Kyphotic;Other exceptions Cervical / Trunk Exceptions: large pannus  Communication   Communication: No difficulties  Cognition Arousal/Alertness: Awake/alert Behavior During Therapy: Restless, Flat affect Overall Cognitive Status: Impaired/Different from baseline Area of Impairment: Orientation, Attention, Memory, Safety/judgement, Following commands, Awareness, Problem solving                 Orientation Level: Disoriented to, Place, Time, Situation ("Aug 1999" no clue in hospital) Current Attention Level: Focused Memory: Decreased recall of precautions, Decreased short-term memory (memory deficits on baseline) Following Commands: Follows one step commands inconsistently, Follows one step commands with increased time (33% of the time) Safety/Judgement: Decreased awareness of safety, Decreased awareness of deficits Awareness: Intellectual Problem Solving: Slow processing,  Decreased initiation, Difficulty sequencing, Requires verbal cues, Requires tactile cues General Comments: Pt cooperative, but very confused, unaware at the hospital, trying to open socks from the wrong end, trying to put therapists hand in his mouth to "eat" (easily re-directed NOT malicious just confused) trouble answering simple questions. Called and confirmed with sister about cog.        General Comments General comments (skin integrity, edema, etc.): OT spoke with sister Verdis Frederickson on the phone after the session. She confirmed home set up and PLOF    Exercises     Assessment/Plan    PT Assessment Patient needs continued PT services  PT Problem List Decreased strength;Decreased activity tolerance;Decreased balance;Decreased mobility;Decreased coordination;Decreased cognition;Decreased knowledge of use of DME;Decreased safety awareness;Decreased knowledge of precautions;Obesity       PT Treatment Interventions DME instruction;Gait training;Functional mobility training;Therapeutic activities;Therapeutic exercise;Balance training;Neuromuscular re-education;Cognitive remediation;Patient/family education    PT Goals (Current goals can be found in the Care Plan section)  Acute Rehab PT Goals Patient Stated Goal: Did not state PT Goal Formulation: Patient unable to participate in goal setting Time For Goal Achievement: 05/17/21 Potential to Achieve Goals: Fair    Frequency Min 2X/week     Co-evaluation PT/OT/SLP Co-Evaluation/Treatment: Yes Reason for Co-Treatment: Necessary to address cognition/behavior during functional activity;For patient/therapist safety;To address functional/ADL transfers PT goals addressed during session: Mobility/safety with mobility OT goals addressed during session: ADL's and self-care  AM-PAC PT "6 Clicks" Mobility  Outcome Measure Help needed turning from your back to your side while in a flat bed without using bedrails?: A Lot Help needed moving  from lying on your back to sitting on the side of a flat bed without using bedrails?: Total Help needed moving to and from a bed to a chair (including a wheelchair)?: Total Help needed standing up from a chair using your arms (e.g., wheelchair or bedside chair)?: Total Help needed to walk in hospital room?: Total Help needed climbing 3-5 steps with a railing? : Total 6 Click Score: 7    End of Session Equipment Utilized During Treatment: Gait belt Activity Tolerance: Patient tolerated treatment well Patient left: in bed;with call bell/phone within reach;with bed alarm set;with restraints reapplied Nurse Communication: Mobility status PT Visit Diagnosis: Unsteadiness on feet (R26.81);Other abnormalities of gait and mobility (R26.89);Other symptoms and signs involving the nervous system (R29.898)    Time: 9758-8325 PT Time Calculation (min) (ACUTE ONLY): 25 min   Charges:   PT Evaluation $PT Eval Moderate Complexity: 1 Mod          Roney [place], [place]  Acute Rehabilitation Services Pager [phone] Office [phone]   [place] 05/03/2021, 12:53 PM

## 2021-05-03 NOTE — Evaluation (Signed)
Occupational Therapy Evaluation Patient Details Name: Michael Lutz MRN: [id number] DOB: October 22, 1945 Today's Date: 05/03/2021   History of Present Illness Michael Lutz is a 76 y.o. male admitted 05/09/2021 with AMS. dx with acute encephalopathy. PMH includes HTN, HLD, PVD, AAA s/p EVAR, prostate cancer, bipolar disorder with anxiety on chronic Klonopin, chronic back pain using fentanyl patch, memory issues.   Clinical Impression   Pt typically lives alone, he does his own medicine and financial management. He uses a quad cane for mobility - but per sister he lives a very sedentary lifestyle at baseline. Today he is very confused and continues to demonstrate AMS - only oriented to self and required multimodal cues for all aspects of bed mobility, transfers, steps up bed, and ADL. Pt is left handed and was able to perform grooming tasks sitting EOB hand over hand. Pt able to perform figure 4 for LB dressing, but trying to open the wrong end of the sock, could not problem solve despite verbal and physical cues. Ultimately requiring max A. At this time Pt overall max A for all aspects of ADL. Pt required mod A +2 for sit<>stand, fatigues quickly. And heavy assist for bed mobility at this time. Pt restless and moaning - at one point indicated pain in L abdomen, but unable to verbalize or describe pain. At this time recommending SNF post-acute to maximize safety and independence in ADL and functional transfers and continued skilled OT in the acute setting with next session to focus on functional ADL/transfers. Pt returned to restraints at the end of session.      Recommendations for follow up therapy are one component of a multi-disciplinary discharge planning process, led by the attending physician.  Recommendations may be updated based on patient status, additional functional criteria and insurance authorization.   Follow Up Recommendations  Skilled nursing-short term rehab (<3 hours/day)    Assistance  Recommended at Discharge Frequent or constant Supervision/Assistance  Patient can return home with the following A lot of help with walking and/or transfers;A lot of help with bathing/dressing/bathroom;Assistance with cooking/housework;Direct supervision/assist for medications management;Assistance with feeding;Direct supervision/assist for financial management;Assist for transportation;Help with stairs or ramp for entrance    Functional Status Assessment  Patient has had a recent decline in their functional status and demonstrates the ability to make significant improvements in function in a reasonable and predictable amount of time.  Equipment Recommendations  BSC/3in1    Recommendations for Other Services PT consult     Precautions / Restrictions Precautions Precautions: Fall Precaution Comments: AMS, restraints Restrictions Weight Bearing Restrictions: No      Mobility Bed Mobility Overal bed mobility: Needs Assistance Bed Mobility: Supine to Sit, Sit to Supine     Supine to sit: Max assist, +2 for physical assistance, +2 for safety/equipment, HOB elevated Sit to supine: Min assist   General bed mobility comments: max A +2 due to decreased initiation, use of bed pad to assist, to come supine required multimodal cues and assist for all aspects. min A to return supine (max A +2 for repositioning once supine)    Transfers Overall transfer level: Needs assistance Equipment used: 2 person hand held assist Transfers: Sit to/from Stand Sit to Stand: Mod assist, +2 physical assistance, +2 safety/equipment           General transfer comment: MOd A +2 for multimodal cues for initiation and sequencing of standing, A for boost and balance      Balance Overall balance assessment: Needs assistance Sitting-balance  support: No upper extremity supported, Feet supported Sitting balance-Leahy Scale: Fair Sitting balance - Comments: able to sit EOB min guard Postural control:  Posterior lean (initially - min guard after 30 seconds or so) Standing balance support: Bilateral upper extremity supported Standing balance-Leahy Scale: Poor                             ADL either performed or assessed with clinical judgement   ADL Overall ADL's : Needs assistance/impaired Eating/Feeding: Minimal assistance;Sitting   Grooming: Maximal assistance;Sitting;Bed level Grooming Details (indicate cue type and reason): hand over hand required, unable to sequence/attend at this time Upper Body Bathing: Maximal assistance   Lower Body Bathing: Maximal assistance   Upper Body Dressing : Maximal assistance   Lower Body Dressing: Maximal assistance;Sitting/lateral leans Lower Body Dressing Details (indicate cue type and reason): attempting to open socks from the wrong end, tried and despite cues unable to problem solve. Toilet Transfer: Moderate assistance;+2 for physical assistance;+2 for safety/equipment Toilet Transfer Details (indicate cue type and reason): side steps up the bed Toileting- Clothing Manipulation and Hygiene: Total assistance       Functional mobility during ADLs: Moderate assistance;+2 for physical assistance;+2 for safety/equipment;Cueing for safety;Cueing for sequencing (2 person HHA) General ADL Comments: cognition, decreased balance, decreased safety awareness     Vision Baseline Vision/History: 1 Wears glasses Ability to See in Adequate Light: 0 Adequate Patient Visual Report: Other (comment) (kept eyes closed a lot throughout session)       Perception     Praxis      Pertinent Vitals/Pain Pain Assessment Pain Assessment: Faces Faces Pain Scale: Hurts little more Pain Location: L abdomen Pain Descriptors / Indicators: Grimacing, Moaning, Tender Pain Intervention(s): Limited activity within patient's tolerance, Monitored during session, Repositioned     Hand Dominance Left   Extremity/Trunk Assessment Upper Extremity  Assessment Upper Extremity Assessment: Generalized weakness   Lower Extremity Assessment Lower Extremity Assessment: Defer to PT evaluation   Cervical / Trunk Assessment Cervical / Trunk Assessment: Kyphotic;Other exceptions Cervical / Trunk Exceptions: large pannus   Communication Communication Communication: No difficulties   Cognition Arousal/Alertness: Awake/alert Behavior During Therapy: Restless, Flat affect Overall Cognitive Status: Impaired/Different from baseline Area of Impairment: Orientation, Attention, Memory, Safety/judgement, Following commands, Awareness, Problem solving                 Orientation Level: Disoriented to, Place, Time, Situation ("Aug 1999" no clue in hospital) Current Attention Level: Focused Memory: Decreased recall of precautions, Decreased short-term memory (memory deficits on baseline) Following Commands: Follows one step commands inconsistently, Follows one step commands with increased time (33% of the time) Safety/Judgement: Decreased awareness of safety, Decreased awareness of deficits Awareness: Intellectual Problem Solving: Slow processing, Decreased initiation, Difficulty sequencing, Requires verbal cues, Requires tactile cues General Comments: Pt cooperative, but very confused, unaware at the hospital, trying to open socks from the wrong end, trying to put therapists hand in his mouth to "eat" (easily re-directed NOT malicious just confused) trouble answering simple questions. Called and confirmed with sister about cog.     General Comments  spoke with sister Verdis Frederickson on the phone after the session. She confirmed home set up and PLOF    Exercises     Shoulder Instructions      Home Living Family/patient expects to be discharged to:: Private residence Living Arrangements: Alone Available Help at Discharge: Family;Available PRN/intermittently Type of Home: House Home Access: Ramped entrance  Home Layout: One level      Bathroom Shower/Tub: Tub/shower unit;Curtain   Biochemist, clinical: Standard     Home Equipment: Cane - quad          Prior Functioning/Environment Prior Level of Function : Independent/Modified Independent             Mobility Comments: uses 4 point cane ADLs Comments: not managing ADL/IADL well at all, needs help but did not tell anyone he needed help        OT Problem List: Decreased strength;Decreased activity tolerance;Impaired balance (sitting and/or standing);Decreased cognition;Decreased safety awareness;Decreased knowledge of use of DME or AE;Obesity      OT Treatment/Interventions: Self-care/ADL training;DME and/or AE instruction;Therapeutic activities;Cognitive remediation/compensation;Patient/family education;Balance training    OT Goals(Current goals can be found in the care plan section) Acute Rehab OT Goals Patient Stated Goal: none stated OT Goal Formulation: Patient unable to participate in goal setting Time For Goal Achievement: 05/17/21 Potential to Achieve Goals: Good ADL Goals Pt Will Perform Grooming: with supervision;standing Pt Will Perform Upper Body Dressing: with modified independence;sitting Pt Will Perform Lower Body Dressing: with supervision;sit to/from stand Pt Will Transfer to Toilet: with supervision;ambulating Pt Will Perform Toileting - Clothing Manipulation and hygiene: with supervision;sit to/from stand Additional ADL Goal #1: Pt will perform bed mobility at mod I level prior to engaging in ADL  OT Frequency: Min 2X/week    Co-evaluation PT/OT/SLP Co-Evaluation/Treatment: Yes Reason for Co-Treatment: Necessary to address cognition/behavior during functional activity;For patient/therapist safety;To address functional/ADL transfers PT goals addressed during session: Mobility/safety with mobility;Balance;Strengthening/ROM OT goals addressed during session: ADL's and self-care      AM-PAC OT "6 Clicks" Daily Activity     Outcome  Measure Help from another person eating meals?: A Little Help from another person taking care of personal grooming?: A Lot Help from another person toileting, which includes using toliet, bedpan, or urinal?: A Lot Help from another person bathing (including washing, rinsing, drying)?: A Lot Help from another person to put on and taking off regular upper body clothing?: A Lot Help from another person to put on and taking off regular lower body clothing?: A Lot 6 Click Score: 13   End of Session Equipment Utilized During Treatment: Gait belt Nurse Communication: Mobility status;Precautions  Activity Tolerance: Patient limited by fatigue;Other (comment) (limited by cognition) Patient left: in bed;with call bell/phone within reach;with restraints reapplied;with bed alarm set  OT Visit Diagnosis: Unsteadiness on feet (R26.81);Other abnormalities of gait and mobility (R26.89);Muscle weakness (generalized) (M62.81);Other symptoms and signs involving cognitive function                Time: 4098-1191 OT Time Calculation (min): 26 min Charges:  OT General Charges $OT Visit: 1 Visit OT Evaluation $OT Eval Moderate Complexity: Helix OTR/L Acute Rehabilitation Services Pager: [phone] Office: [place] 05/03/2021, 11:54 AM

## 2021-05-03 NOTE — Progress Notes (Signed)
Patient restless , keeps getting out of bed, not following commands, notified on call MD Alcario Drought and made aware, will order posey and bilateral soft wrist restraints. Will continue to monitor patient.

## 2021-05-03 NOTE — Progress Notes (Signed)
PROGRESS NOTE    Michael Lutz  YSA:[id number] DOB: 01-04-1946 DOA: 04/14/2021 PCP: Kathyrn Lass, MD     Brief Narrative:  76 y.o. WM PMHX  HTN, HLD, PVD, AAA s/p EVAR, prostate cancer, bipolar disorder with anxiety on chronic Klonopin, chronic back pain using fentanyl patch, s/p spinal cord stimulator memory issues who presented to the ED for evaluation of altered mental status.  History limited from patient otherwise supplemented by patient's sister at bedside.  Per patient's sister, patient lives alone and does have chronic memory issues although at baseline is fully alert and oriented, lucid, and answers questions appropriately.  Last night (2/19) he was noted to be confused and not speaking appropriately.  They noted that he had not been eating well last few days.  They tried to encourage oral hydration.  They checked back in on him this morning and he was still very confused.  He had his medication pills scattered all over the floor.  He was noted to be lethargic and not answering questions appropriately.   ED Course   Labs/Imaging on admission: I have personally reviewed following labs and imaging studies.   Initial vitals showed BP 179/106, pulse 77, RR 15, temp 98.7 F, SPO2 97% on room air.   Labs showed WBC 11.1, hemoglobin 16.2, platelets 153,000, sodium 139, potassium 3.4, bicarb 19, BUN 24, creatinine 1.13, serum glucose 103, LFTs within normal limits, BNP 334.2, lactic acid 1.2, serum ethanol <10, ammonia 35.   Urinalysis negative for UTI.  UDS positive for benzodiazepines.   Portable chest x-ray shows chronic increased interstitial markings throughout the right lung and left lung base without acute cardiopulmonary disease.   CT head without contrast is negative for acute intercranial abnormality.  Chronic diffuse atrophy and mild chronic bilateral periventricular white matter small vessel ischemic changes noted.   Patient was given 1 L normal saline, IV Narcan 0.4 mg, IV  Ativan 0.5 mg for sedation prior to CT scan.  The hospitalist service was consulted to admit for further evaluation and management.   Subjective: Patient awake will call his daughter's name who standing at bedside.  Does acknowledge that physician and shakes my hand.  A/O x1 (does not know where, when, why).   Assessment & Plan: Covid vaccination;   Principal Problem:   Acute encephalopathy Active Problems:   Benign hypertension   Bipolar affective disorder (HCC)   Chronic pain syndrome   Pure hypercholesterolemia   At risk for delirium   Morbidly obese (HCC)   Chronic diastolic CHF (congestive heart failure) (HCC)   S/P insertion of spinal cord stimulator   Candidal intertrigo  Acute encephalopathy- (present on admission) Suspect multifactorial from polypharmacy and dehydration from poor oral intake on the background of presumed cognitive impairment and/or undiagnosed dementia. -Continue gentle IV fluid hydration overnight -Hold potentially inciting meds for now (Klonopin, Robaxin, Seroquel, trazodone, venlafaxine) -CT head negative for acute abnormality -2/21 CBG QID -2/21 MRI brain notified that patient has spinal cord stimulator in place on safe for MRI. -2/21 D5-0.9% saline 17ml/hr  Leukocytosis Lab Results  Component Value Date   WBC 13.8 (H) 05/03/2021   WBC 11.1 (H) 04/20/2021   WBC 6.8 10/05/2020   WBC 6.9 09/04/2019   WBC 9.1 06/04/2016  -2/21 lactic acid and procalcitonin pending. - 2/21 LP pending most likely will not get done until A.m.     Bipolar affective disorder ([place])- (present on admission) -Resume home meds once mental status improves.  At risk for delirium -Family  reports chronic issues with memory impairment and have noted that he has been having increased difficulty taking his medications, eating, losing his keys, and other daily activities.  They are concerned about underlying dementia.  Place on delirium precautions while in  hospital.  Benzodiazepine withdrawal - CIWA protocol  Chronic diastolic CHF -Strict in and out - Daily weight -2/21 Metoprolol IV 5 mg TID -2/21 Hydralazine IV  A-fib new onset - 2/21 currently rate controlled - 2/21 Lovenox per pharmacy   Benign hypertension- (present on admission) -See CHF   Chronic pain syndrome/Chronic Opioid withdrawal- (present on admission) -2/21 morphine PRN -Uses fentanyl patch chronically, resume when mental status more clear.   Pure hypercholesterolemia- (present on admission) -2/21 lipid panel pending  Hypokalemia - Potassium goal> 4 - 2/21 Potassium IV 50 mEq    Morbidly obese (BMI 40.1 kg /m) -Address with PCP as outpatient  Candidal intertrigo -2/21 Clotrimazole 1% cream BID to affected area     DVT prophylaxis: Lovenox Code Status: Full Family Communication: 2/21 daughter at bedside for discussion of plan of care all questions answered Status is: Inpatient    Dispo: The patient is from: Home              Anticipated d/c is to: Home              Anticipated d/c date is: 3 days              Patient currently is not medically stable to d/c.      Consultants:    Procedures/Significant Events:    I have personally reviewed and interpreted all radiology studies and my findings are as above.  VENTILATOR SETTINGS:    Cultures 2/20 influenza A/B negative 2/20 SARS coronavirus negative 2/21 urine pending 2/21 blood pending    Antimicrobials: Anti-infectives (From admission, onward)    None         Devices    LINES / TUBES:      Continuous Infusions:  dextrose 5 % and 0.9% NaCl     methocarbamol (ROBAXIN) IV     potassium chloride       Objective: Vitals:   05/03/21 0137 05/03/21 0441 05/03/21 0940 05/03/21 1637  BP: (!) 176/105 (!) 179/86 (!) 191/92 (!) 190/103  Pulse: 88 76 89 90  Resp: 18 19 18 18   Temp: 98.2 F (36.8 C) 98.2 F (36.8 C) 98.4 F (36.9 C)   TempSrc: Oral     SpO2: 90%  93% 90% 96%  Weight:        Intake/Output Summary (Last 24 hours) at 05/03/2021 1703 Last data filed at 05/03/2021 1259 Gross per 24 hour  Intake 1448.01 ml  Output 750 ml  Net 698.01 ml   Filed Weights   04/15/2021 2014  Weight: 119.9 kg    Examination:  General: Patient recognizes daughter at bedside will call her name, does not follow commands.  However will speak to you.  No acute respiratory distress Eyes: negative scleral hemorrhage, negative anisocoria, negative icterus ENT: Negative Runny nose, negative gingival bleeding, Neck:  Negative scars, masses, torticollis, lymphadenopathy, JVD Lungs: Clear to auscultation bilaterally without wheezes or crackles Cardiovascular: Irregular irregular rhythm and rate without murmur gallop or rub normal S1 and S2 Abdomen: OBESE, negative abdominal pain, nondistended, positive soft, bowel sounds, no rebound, no ascites, no appreciable mass Extremities: No significant cyanosis, clubbing, or edema bilateral lower extremities Skin: Negative rashes, lesions, ulcers Psychiatric: Fully evaluate secondary to altered mental status Central nervous  system:  Cranial nerves II through XII intact, tongue/uvula midline, a Spontaneously moves all extremities, does not follow commands   .     Data Reviewed: Care during the described time interval was provided by me .  I have reviewed this patient's available data, including medical history, events of note, physical examination, and all test results as part of my evaluation.  CBC: Recent Labs  Lab 05/08/2021 1615 05/03/21 0410  WBC 11.1* 13.8*  NEUTROABS 8.9*  --   HGB 16.2 16.1  HCT 49.5 48.7  MCV 85.8 85.0  PLT 153 222   Basic Metabolic Panel: Recent Labs  Lab 04/23/2021 1615 05/03/21 0410  NA 139 141  K 3.4* 3.1*  CL 108 107  CO2 19* 22  GLUCOSE 103* 81  BUN 24* 21  CREATININE 1.13 1.12  CALCIUM 9.2 8.8*  MG  --  1.7   GFR: CrCl cannot be calculated (Unknown ideal weight.). Liver  Function Tests: Recent Labs  Lab 05/05/2021 1615  AST 34  ALT 19  ALKPHOS 50  BILITOT 1.3*  PROT 7.3  ALBUMIN 3.9   No results for input(s): LIPASE, AMYLASE in the last 168 hours. Recent Labs  Lab 04/20/2021 1725  AMMONIA 35   Coagulation Profile: No results for input(s): INR, PROTIME in the last 168 hours. Cardiac Enzymes: No results for input(s): CKTOTAL, CKMB, CKMBINDEX, TROPONINI in the last 168 hours. BNP (last 3 results) No results for input(s): PROBNP in the last 8760 hours. HbA1C: No results for input(s): HGBA1C in the last 72 hours. CBG: No results for input(s): GLUCAP in the last 168 hours. Lipid Profile: No results for input(s): CHOL, HDL, LDLCALC, TRIG, CHOLHDL, LDLDIRECT in the last 72 hours. Thyroid Function Tests: No results for input(s): TSH, T4TOTAL, FREET4, T3FREE, THYROIDAB in the last 72 hours. Anemia Panel: Recent Labs    05/03/21 0410  VITAMINB12 488   Sepsis Labs: Recent Labs  Lab 04/15/2021 1625 05/03/21 1541  PROCALCITON  --  <0.10  LATICACIDVEN 1.2  --     Recent Results (from the past 240 hour(s))  Resp Panel by RT-PCR (Flu A&B, Covid) Nasopharyngeal Swab     Status: None   Collection Time: 04/14/2021  7:03 PM   Specimen: Nasopharyngeal Swab; Nasopharyngeal(NP) swabs in vial transport medium  Result Value Ref Range Status   SARS Coronavirus 2 by RT PCR NEGATIVE NEGATIVE Final    Comment: (NOTE) SARS-CoV-2 target nucleic acids are NOT DETECTED.  The SARS-CoV-2 RNA is generally detectable in upper respiratory specimens during the acute phase of infection. The lowest concentration of SARS-CoV-2 viral copies this assay can detect is 138 copies/mL. A negative result does not preclude SARS-Cov-2 infection and should not be used as the sole basis for treatment or other patient management decisions. A negative result may occur with  improper specimen collection/handling, submission of specimen other than nasopharyngeal swab, presence of viral  mutation(s) within the areas targeted by this assay, and inadequate number of viral copies(<138 copies/mL). A negative result must be combined with clinical observations, patient history, and epidemiological information. The expected result is Negative.  Fact Sheet for Patients:  EntrepreneurPulse.com.au  Fact Sheet for Healthcare Providers:  IncredibleEmployment.be  This test is no t yet approved or cleared by the [place] FDA and  has been authorized for detection and/or diagnosis of SARS-CoV-2 by FDA under an Emergency Use Authorization (EUA). This EUA will remain  in effect (meaning this test can be used) for the duration of the COVID-19 declaration  under Section 564(b)(1) of the Act, 21 U.S.C.section 360bbb-3(b)(1), unless the authorization is terminated  or revoked sooner.       Influenza A by PCR NEGATIVE NEGATIVE Final   Influenza B by PCR NEGATIVE NEGATIVE Final    Comment: (NOTE) The Xpert Xpress SARS-CoV-2/FLU/RSV plus assay is intended as an aid in the diagnosis of influenza from Nasopharyngeal swab specimens and should not be used as a sole basis for treatment. Nasal washings and aspirates are unacceptable for Xpert Xpress SARS-CoV-2/FLU/RSV testing.  Fact Sheet for Patients: EntrepreneurPulse.com.au  Fact Sheet for Healthcare Providers: IncredibleEmployment.be  This test is not yet approved or cleared by the [place] FDA and has been authorized for detection and/or diagnosis of SARS-CoV-2 by FDA under an Emergency Use Authorization (EUA). This EUA will remain in effect (meaning this test can be used) for the duration of the COVID-19 declaration under Section 564(b)(1) of the Act, 21 U.S.C. section 360bbb-3(b)(1), unless the authorization is terminated or revoked.  Performed at [facility], [place] [address], [place], [place] [address]          Radiology Studies: CT  Head Wo Contrast  Result Date: 05/09/2021 CLINICAL DATA:  Mental status change. EXAM: CT HEAD WITHOUT CONTRAST TECHNIQUE: Contiguous axial images were obtained from the base of the skull through the vertex without intravenous contrast. RADIATION DOSE REDUCTION: This exam was performed according to the departmental dose-optimization program which includes automated exposure control, adjustment of the mA and/or kV according to patient size and/or use of iterative reconstruction technique. COMPARISON:  May 31, 2016 FINDINGS: Brain: No evidence of acute infarction, hemorrhage, hydrocephalus, extra-axial collection or mass lesion/mass effect. There is chronic diffuse atrophy. Mild chronic bilateral periventricular white matter small vessel ischemic changes are identified. Vascular: No hyperdense vessel is noted. Skull: Normal. Negative for fracture or focal lesion. Sinuses/Orbits: No acute finding. Other: None. IMPRESSION: 1. No acute intracranial abnormality identified. 2. Chronic diffuse atrophy. 3. Mild chronic bilateral periventricular white matter small vessel ischemic changes. Electronically Signed   By: Abelardo Diesel M.D.   On: 04/20/2021 17:04   DG Chest Port 1 View  Result Date: 04/18/2021 CLINICAL DATA:  Rales in bilateral lung bases. EXAM: PORTABLE CHEST 1 VIEW COMPARISON:  October 05, 2020 FINDINGS: The heart size and mediastinal contours are stable. Heart size is enlarged. Chronic increased interstitial markings are identified throughout the right lung and left lung base unchanged. No pleural effusion or focal pneumonia is noted. The visualized skeletal structures are stable. IMPRESSION: No acute cardiopulmonary disease identified. Chronic changes of bilateral lungs stable compared prior exam. Electronically Signed   By: Abelardo Diesel M.D.   On: 04/13/2021 16:11        Scheduled Meds:  enoxaparin (LOVENOX) injection  40 mg Subcutaneous K02R   folic acid  1 mg Oral Daily   hydrALAZINE  5 mg  Intravenous Q8H   LORazepam  0-4 mg Intravenous Q4H   Followed by   Derrill Memo ON 05/05/2021] LORazepam  0-4 mg Intravenous Q8H   metoprolol tartrate  5 mg Intravenous Q8H   multivitamin with minerals  1 tablet Oral Daily   tamsulosin  0.4 mg Oral QHS   thiamine  100 mg Oral Daily   Or   thiamine  100 mg Intravenous Daily   Continuous Infusions:  dextrose 5 % and 0.9% NaCl     methocarbamol (ROBAXIN) IV     potassium chloride       LOS: 0 days    Time  spent:40 min    Advith Martine, Geraldo Docker, MD [facility]   If 7PM-7AM, please contact night-coverage 05/03/2021, 5:03 PM

## 2021-05-03 NOTE — Progress Notes (Signed)
New Admission Note:   Arrival Method:  via stretcher from ED Mental Orientation: alert & oriented to self Telemetry : 5M20, CCMD notified Assessment: to be completed Skin: MASD right groin and right abdominal folds IV:  left hand, nsl Pain: 0/10 Tubes: None Safety Measures: Safety Fall Prevention Plan has been discussed  Admission: to be completed 5 [place] Orientation: Patient has been oriented to the room, unit and staff.   Family: none at bedside  Orders to be reviewed and implemented. Will continue to monitor the patient. Call light has been placed within reach and bed alarm has been activated.

## 2021-05-03 NOTE — Progress Notes (Signed)
Pt has unsafe Spinal cord stimulator, unable to have MRI, ordering Dr. Darlyn Chamber.

## 2021-05-04 ENCOUNTER — Inpatient Hospital Stay (HOSPITAL_COMMUNITY): Payer: PPO

## 2021-05-04 DIAGNOSIS — G9340 Encephalopathy, unspecified: Secondary | ICD-10-CM | POA: Diagnosis not present

## 2021-05-04 LAB — CBC WITH DIFFERENTIAL/PLATELET
Abs Immature Granulocytes: 0.06 10*3/uL (ref 0.00–0.07)
Basophils Absolute: 0 10*3/uL (ref 0.0–0.1)
Basophils Relative: 0 %
Eosinophils Absolute: 0 10*3/uL (ref 0.0–0.5)
Eosinophils Relative: 0 %
HCT: 48.5 % (ref 39.0–52.0)
Hemoglobin: 15.8 g/dL (ref 13.0–17.0)
Immature Granulocytes: 0 %
Lymphocytes Relative: 8 %
Lymphs Abs: 1.1 10*3/uL (ref 0.7–4.0)
MCH: 28 pg (ref 26.0–34.0)
MCHC: 32.6 g/dL (ref 30.0–36.0)
MCV: 85.8 fL (ref 80.0–100.0)
Monocytes Absolute: 1 10*3/uL (ref 0.1–1.0)
Monocytes Relative: 7 %
Neutro Abs: 11.8 10*3/uL — ABNORMAL HIGH (ref 1.7–7.7)
Neutrophils Relative %: 85 %
Platelets: 159 10*3/uL (ref 150–400)
RBC: 5.65 MIL/uL (ref 4.22–5.81)
RDW: 16.5 % — ABNORMAL HIGH (ref 11.5–15.5)
WBC: 14 10*3/uL — ABNORMAL HIGH (ref 4.0–10.5)
nRBC: 0 % (ref 0.0–0.2)

## 2021-05-04 LAB — MAGNESIUM: Magnesium: 1.7 mg/dL (ref 1.7–2.4)

## 2021-05-04 LAB — COMPREHENSIVE METABOLIC PANEL
ALT: 28 U/L (ref 0–44)
AST: 51 U/L — ABNORMAL HIGH (ref 15–41)
Albumin: 3.7 g/dL (ref 3.5–5.0)
Alkaline Phosphatase: 53 U/L (ref 38–126)
Anion gap: 11 (ref 5–15)
BUN: 19 mg/dL (ref 8–23)
CO2: 22 mmol/L (ref 22–32)
Calcium: 8.6 mg/dL — ABNORMAL LOW (ref 8.9–10.3)
Chloride: 108 mmol/L (ref 98–111)
Creatinine, Ser: 0.96 mg/dL (ref 0.61–1.24)
GFR, Estimated: >60 mL/min (ref >60)
Glucose, Bld: 97 mg/dL (ref 70–99)
Potassium: 3.3 mmol/L — ABNORMAL LOW (ref 3.5–5.1)
Sodium: 141 mmol/L (ref 135–145)
Total Bilirubin: 1.5 mg/dL — ABNORMAL HIGH (ref 0.3–1.2)
Total Protein: 6.8 g/dL (ref 6.5–8.1)

## 2021-05-04 LAB — LIPID PANEL
Cholesterol: 204 mg/dL — ABNORMAL HIGH (ref 0–200)
HDL: 26 mg/dL — ABNORMAL LOW (ref >40)
LDL Cholesterol: 158 mg/dL — ABNORMAL HIGH (ref 0–99)
Total CHOL/HDL Ratio: 7.8 RATIO
Triglycerides: 99 mg/dL (ref <150)
VLDL: 20 mg/dL (ref 0–40)

## 2021-05-04 LAB — GLUCOSE, CAPILLARY
Glucose-Capillary: 102 mg/dL — ABNORMAL HIGH (ref 70–99)
Glucose-Capillary: 107 mg/dL — ABNORMAL HIGH (ref 70–99)
Glucose-Capillary: 107 mg/dL — ABNORMAL HIGH (ref 70–99)
Glucose-Capillary: 91 mg/dL (ref 70–99)
Glucose-Capillary: 98 mg/dL (ref 70–99)

## 2021-05-04 LAB — PHOSPHORUS: Phosphorus: 1.5 mg/dL — ABNORMAL LOW (ref 2.5–4.6)

## 2021-05-04 LAB — PROCALCITONIN: Procalcitonin: <0.1 ng/mL

## 2021-05-04 NOTE — TOC Progression Note (Signed)
Transition of Care [facility]) - Initial/Assessment Note    Patient Details  Name: Michael Lutz MRN: [id number] Date of Birth: 1945-04-17  Transition of [facility] CM/SW Contact:    Milinda Antis, [place] Phone Number: 05/04/2021, 10:34 AM  Clinical Narrative:                 TOC received consults for SA assessment and SNF placement.  The patient is currently in restraints, agitated, and only oriented to self, which according to family is not the patient's baseline.  TOC will continue to follow.       Patient Goals and CMS Choice        Expected Discharge Plan and Services                                                Prior Living Arrangements/Services                       Activities of Daily Living      Permission Sought/Granted                  Emotional Assessment              Admission diagnosis:  Encephalopathy [G93.40] Acute encephalopathy [G93.40] Patient Active Problem List   Diagnosis Date Noted   Morbidly obese ([place]) 05/03/2021   Chronic diastolic CHF (congestive heart failure) ([place]) 05/03/2021   S/P insertion of spinal cord stimulator 05/03/2021   Candidal intertrigo 05/03/2021   At risk for delirium 04/22/2021   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   Aortic valve calcification 03/11/2020   Hardening of the aorta (main artery of the heart) ([place]) 03/11/2020   Benign prostatic hyperplasia with lower urinary tract symptoms 03/11/2020   Bipolar affective disorder ([place]) 03/11/2020   Chronic pain syndrome 03/11/2020   Degeneration of lumbar intervertebral disc 03/11/2020   Elevated PSA 03/11/2020   Fusion of spine, lumbosacral region 03/11/2020   History of tobacco use 03/11/2020   Impairment of balance 03/11/2020   Memory loss 03/11/2020   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   Nicotine dependence, cigarettes, uncomplicated 47/65/4650   Osteopenia 03/11/2020   Other specified postprocedural states 03/11/2020    Personal history of colonic polyps 03/11/2020   Pure hypercholesterolemia 03/11/2020   Personal history of other diseases of the circulatory system 03/11/2020   Recurrent falls 03/11/2020   Slow transit constipation 03/11/2020   Malignant neoplasm of prostate ([place]) 06/24/2019   Pulmonary nodule 12/13/2016   Pulmonary emphysema ([place]) 07/20/2016   Mediastinal adenopathy 07/20/2016   Difficulty with speech    Acute encephalopathy 05/31/2016   Abdominal aortic aneurysm ([place]) 01/08/2014   Benign hypertension 01/02/2014   Abdominal aneurysm without mention of rupture 07/03/2013   LBP (low back pain) 05/22/2013   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

## 2021-05-04 NOTE — Plan of Care (Signed)
  Problem: Health Behavior/Discharge Planning: Goal: Ability to manage health-related needs will improve Outcome: Not Progressing   

## 2021-05-04 NOTE — Progress Notes (Signed)
PROGRESS NOTE    Michael Lutz  GGE:[id number] DOB: 03/31/1945 DOA: 05/08/2021 PCP: Kathyrn Lass, MD   Brief Narrative:  76 y.o. WM PMHX  HTN, HLD, PVD, AAA s/p EVAR, prostate cancer, bipolar disorder with anxiety on chronic Klonopin, chronic back pain using fentanyl patch, s/p spinal cord stimulator memory issues who presented to the ED for evaluation of altered mental status.  Per patient's sister, patient lives alone and does have chronic memory issues although at baseline is fully alert and oriented, lucid, and answers questions appropriately.  Noted to be acutely confused on 05/01/2021 with difficulty formulating sentences as well as poor p.o. intake over the previous few days, multiple medications noted around the house and spilled.  UDS positive for benzodiazepines.  Assessment & Plan:   Principal Problem:   Acute encephalopathy Active Problems:   Benign hypertension   Bipolar affective disorder (HCC)   Chronic pain syndrome   Pure hypercholesterolemia   At risk for delirium   Morbidly obese (HCC)   Chronic diastolic CHF (congestive heart failure) (HCC)   S/P insertion of spinal cord stimulator   Candidal intertrigo  Acute encephalopathy-toxic versus metabolic (present on admission) High risk for polypharmacy Rule out withdrawal versus incidental/accidental overdose, POA -Likely multifactorial in the setting of poor p.o. intake, polypharmacy and likely baseline cognitive impairment with high risk of sundowning and delirium given age comorbid conditions and home medications -CT head negative at intake -MRI unable to be performed due to spinal stimulator -Hold off on lumbar puncture given patient remains afebrile with negative procalcitonin -Home medications include Klonopin, Robaxin, Seroquel, trazodone, venlafaxine -which are currently being held -Continue supportive care including benzodiazepines and multivitamins in the setting of malnutrition -Titrate benzodiazepines  aggressively given lifelong prescriptions and likely high tolerance as well as high risk for withdrawals   Leukocytosis, likely reactive -Sepsis ruled out given no overt sources  -Remains afebrile with minimal white count, negative procalcitonin -Hold LP until patient is able to tolerate the procedure more safely  Bipolar affective disorder ([place])- (present on admission) -Resume home medications once able to take p.o. safely, may sideline psych for assistance with reinitiating patient's multiple medications in the setting of likely polypharmacy  Chronic diastolic CHF Hypertension, essential -Continue to follow I's and O's Daily weights -Continue metoprolol, hydralazine as needed; transition back to home meds when able to take p.o. safely  A-fib new onset, likely provoked -Rate controlled, follow rhythm -Continue metoprolol IV as above  Chronic pain syndrome/Chronic Opioid withdrawal- (present on admission) -Chronically on fentanyl patch -Continue low-dose morphine to avoid withdrawals given above mental status   Pure hypercholesterolemia- (present on admission) -elevated LDL, cholesterol; Low HDL Hypokalemia Follow repeat labs Morbidly obese (BMI 40.1 kg /m)-Address with PCP as outpatient Candidal intertrigo -2/21 Clotrimazole 1% cream BID to affected area  DVT prophylaxis: Lovenox Code Status: Full Family Communication: None present  Status is: Inpatient  Dispo: The patient is from: Home              Anticipated d/c is to: To be determined              Anticipated d/c date is: 72-96 hours              Patient currently not medically stable for discharge  Consultants:  None  Procedures:  None  Antimicrobials:  Clotrimazole cream  Subjective: No acute issues or events overnight, patient continues to be altered tolerating IV medications quite well but unable to take p.o. or participate in  review of systems or physical exam  Objective: Vitals:   05/03/21 2157 05/04/21  0555 05/04/21 0556 05/04/21 0629  BP: (!) 157/98 (!) 141/101  (!) 162/96  Pulse: 89 76  82  Resp: 18 18  20   Temp: 99.2 F (37.3 C) (!) 100.5 F (38.1 C)  98.7 F (37.1 C)  TempSrc: Oral Oral  Oral  SpO2:    96%  Weight:   116.7 kg     Intake/Output Summary (Last 24 hours) at 05/04/2021 0847 Last data filed at 05/04/2021 0600 Gross per 24 hour  Intake 1567.75 ml  Output 1300 ml  Net 267.75 ml   Filed Weights   05/04/2021 2014 05/04/21 0556  Weight: 119.9 kg 116.7 kg    Examination:  General: Resting comfortably, somnolent, poorly arousable but protecting airway. HEENT: Pupils moderately dilated but reactive Lungs: Without overt wheeze rhonchi or rales Heart: Irregularly Giller Abdomen:  Soft, nontender, nondistended.  Without guarding or rebound. Extremities: Without cyanosis, clubbing, edema, or obvious deformity.  Data Reviewed: I have personally reviewed following labs and imaging studies  CBC: Recent Labs  Lab 05/08/2021 1615 05/03/21 0410 05/04/21 0354  WBC 11.1* 13.8* 14.0*  NEUTROABS 8.9*  --  11.8*  HGB 16.2 16.1 15.8  HCT 49.5 48.7 48.5  MCV 85.8 85.0 85.8  PLT 153 155 517   Basic Metabolic Panel: Recent Labs  Lab 04/20/2021 1615 05/03/21 0410 05/04/21 0354  NA 139 141 141  K 3.4* 3.1* 3.3*  CL 108 107 108  CO2 19* 22 22  GLUCOSE 103* 81 97  BUN 24* 21 19  CREATININE 1.13 1.12 0.96  CALCIUM 9.2 8.8* 8.6*  MG  --  1.7 1.7  PHOS  --   --  1.5*   GFR: CrCl cannot be calculated (Unknown ideal weight.). Liver Function Tests: Recent Labs  Lab 04/19/2021 1615 05/04/21 0354  AST 34 51*  ALT 19 28  ALKPHOS 50 53  BILITOT 1.3* 1.5*  PROT 7.3 6.8  ALBUMIN 3.9 3.7   No results for input(s): LIPASE, AMYLASE in the last 168 hours. Recent Labs  Lab 05/05/2021 1725  AMMONIA 35   Coagulation Profile: No results for input(s): INR, PROTIME in the last 168 hours. Cardiac Enzymes: No results for input(s): CKTOTAL, CKMB, CKMBINDEX, TROPONINI in the last  168 hours. BNP (last 3 results) No results for input(s): PROBNP in the last 8760 hours. HbA1C: Recent Labs    05/03/21 1529  HGBA1C 5.3   CBG: Recent Labs  Lab 05/04/21 0002 05/04/21 0353 05/04/21 0551  GLUCAP 102* 98 107*   Lipid Profile: Recent Labs    05/04/21 0354  CHOL 204*  HDL 26*  LDLCALC 158*  TRIG 99  CHOLHDL 7.8   Thyroid Function Tests: No results for input(s): TSH, T4TOTAL, FREET4, T3FREE, THYROIDAB in the last 72 hours. Anemia Panel: Recent Labs    05/03/21 0410  VITAMINB12 488   Sepsis Labs: Recent Labs  Lab 05/04/2021 1625 05/03/21 1541 05/03/21 1620 05/03/21 2010 05/04/21 0354  PROCALCITON  --  <0.10  --   --  <0.10  LATICACIDVEN 1.2  --  1.3 1.2  --     Recent Results (from the past 240 hour(s))  Resp Panel by RT-PCR (Flu A&B, Covid) Nasopharyngeal Swab     Status: None   Collection Time: 05/06/2021  7:03 PM   Specimen: Nasopharyngeal Swab; Nasopharyngeal(NP) swabs in vial transport medium  Result Value Ref Range Status   SARS Coronavirus 2 by RT PCR NEGATIVE  NEGATIVE Final    Comment: (NOTE) SARS-CoV-2 target nucleic acids are NOT DETECTED.  The SARS-CoV-2 RNA is generally detectable in upper respiratory specimens during the acute phase of infection. The lowest concentration of SARS-CoV-2 viral copies this assay can detect is 138 copies/mL. A negative result does not preclude SARS-Cov-2 infection and should not be used as the sole basis for treatment or other patient management decisions. A negative result may occur with  improper specimen collection/handling, submission of specimen other than nasopharyngeal swab, presence of viral mutation(s) within the areas targeted by this assay, and inadequate number of viral copies(<138 copies/mL). A negative result must be combined with clinical observations, patient history, and epidemiological information. The expected result is Negative.  Fact Sheet for Patients:   EntrepreneurPulse.com.au  Fact Sheet for Healthcare Providers:  IncredibleEmployment.be  This test is no t yet approved or cleared by the [place] FDA and  has been authorized for detection and/or diagnosis of SARS-CoV-2 by FDA under an Emergency Use Authorization (EUA). This EUA will remain  in effect (meaning this test can be used) for the duration of the COVID-19 declaration under Section 564(b)(1) of the Act, 21 U.S.C.section 360bbb-3(b)(1), unless the authorization is terminated  or revoked sooner.       Influenza A by PCR NEGATIVE NEGATIVE Final   Influenza B by PCR NEGATIVE NEGATIVE Final    Comment: (NOTE) The Xpert Xpress SARS-CoV-2/FLU/RSV plus assay is intended as an aid in the diagnosis of influenza from Nasopharyngeal swab specimens and should not be used as a sole basis for treatment. Nasal washings and aspirates are unacceptable for Xpert Xpress SARS-CoV-2/FLU/RSV testing.  Fact Sheet for Patients: EntrepreneurPulse.com.au  Fact Sheet for Healthcare Providers: IncredibleEmployment.be  This test is not yet approved or cleared by the [place] FDA and has been authorized for detection and/or diagnosis of SARS-CoV-2 by FDA under an Emergency Use Authorization (EUA). This EUA will remain in effect (meaning this test can be used) for the duration of the COVID-19 declaration under Section 564(b)(1) of the Act, 21 U.S.C. section 360bbb-3(b)(1), unless the authorization is terminated or revoked.  Performed at [facility], [place] [address] [place], [place] [address]   Culture, blood (routine x 2)     Status: None (Preliminary result)   Collection Time: 05/03/21  3:28 PM   Specimen: Right Antecubital; Blood  Result Value Ref Range Status   Specimen Description RIGHT ANTECUBITAL  Final   Special Requests   Final    BOTTLES DRAWN AEROBIC AND ANAEROBIC Blood Culture adequate volume    Culture   Final    NO GROWTH < 24 HOURS Performed at [facility], [place] [address] [place], [place] [address]    Report Status PENDING  Incomplete  Culture, blood (routine x 2)     Status: None (Preliminary result)   Collection Time: 05/03/21  3:29 PM   Specimen: Left Antecubital; Blood  Result Value Ref Range Status   Specimen Description LEFT ANTECUBITAL  Final   Special Requests   Final    BOTTLES DRAWN AEROBIC AND ANAEROBIC Blood Culture adequate volume   Culture   Final    NO GROWTH < 24 HOURS Performed at [facility], [place] [address] [place],  [address]    Report Status PENDING  Incomplete    Radiology Studies: CT Head Wo Contrast  Result Date: 04/19/2021 CLINICAL DATA:  Mental status change. EXAM: CT HEAD WITHOUT CONTRAST TECHNIQUE: Contiguous axial images were obtained from  the base of the skull through the vertex without intravenous contrast. RADIATION DOSE REDUCTION: This exam was performed according to the departmental dose-optimization program which includes automated exposure control, adjustment of the mA and/or kV according to patient size and/or use of iterative reconstruction technique. COMPARISON:  May 31, 2016 FINDINGS: Brain: No evidence of acute infarction, hemorrhage, hydrocephalus, extra-axial collection or mass lesion/mass effect. There is chronic diffuse atrophy. Mild chronic bilateral periventricular white matter small vessel ischemic changes are identified. Vascular: No hyperdense vessel is noted. Skull: Normal. Negative for fracture or focal lesion. Sinuses/Orbits: No acute finding. Other: None. IMPRESSION: 1. No acute intracranial abnormality identified. 2. Chronic diffuse atrophy. 3. Mild chronic bilateral periventricular white matter small vessel ischemic changes. Electronically Signed   By: Abelardo Diesel M.D.   On: 04/25/2021 17:04   DG Chest Port 1 View  Result Date: 05/01/2021 CLINICAL DATA:  Rales in bilateral lung bases. EXAM:  PORTABLE CHEST 1 VIEW COMPARISON:  October 05, 2020 FINDINGS: The heart size and mediastinal contours are stable. Heart size is enlarged. Chronic increased interstitial markings are identified throughout the right lung and left lung base unchanged. No pleural effusion or focal pneumonia is noted. The visualized skeletal structures are stable. IMPRESSION: No acute cardiopulmonary disease identified. Chronic changes of bilateral lungs stable compared prior exam. Electronically Signed   By: Abelardo Diesel M.D.   On: 05/09/2021 16:11    Scheduled Meds:  clotrimazole   Topical BID   enoxaparin (LOVENOX) injection  40 mg Subcutaneous Q76A   folic acid  1 mg Oral Daily   hydrALAZINE  5 mg Intravenous Q8H   LORazepam  0-4 mg Intravenous Q4H   Followed by   Derrill Memo ON 05/05/2021] LORazepam  0-4 mg Intravenous Q8H   metoprolol tartrate  5 mg Intravenous Q8H   multivitamin with minerals  1 tablet Oral Daily   tamsulosin  0.4 mg Oral QHS   thiamine  100 mg Oral Daily   Or   thiamine  100 mg Intravenous Daily   Continuous Infusions:  dextrose 5 % and 0.9% NaCl 75 mL/hr at 05/03/21 1936   methocarbamol (ROBAXIN) IV      LOS: 1 day   Time spent: 38min  Michael Formosa C Marque Rademaker, DO [facility]  If 7PM-7AM, please contact night-coverage [email or web address]  05/04/2021, 8:47 AM

## 2021-05-05 DIAGNOSIS — G9340 Encephalopathy, unspecified: Secondary | ICD-10-CM | POA: Diagnosis not present

## 2021-05-05 LAB — COMPREHENSIVE METABOLIC PANEL
ALT: 32 U/L (ref 0–44)
AST: 42 U/L — ABNORMAL HIGH (ref 15–41)
Albumin: 3.6 g/dL (ref 3.5–5.0)
Alkaline Phosphatase: 55 U/L (ref 38–126)
Anion gap: 11 (ref 5–15)
BUN: 22 mg/dL (ref 8–23)
CO2: 22 mmol/L (ref 22–32)
Calcium: 8.6 mg/dL — ABNORMAL LOW (ref 8.9–10.3)
Chloride: 112 mmol/L — ABNORMAL HIGH (ref 98–111)
Creatinine, Ser: 1.04 mg/dL (ref 0.61–1.24)
GFR, Estimated: >60 mL/min (ref >60)
Glucose, Bld: 113 mg/dL — ABNORMAL HIGH (ref 70–99)
Potassium: 2.8 mmol/L — ABNORMAL LOW (ref 3.5–5.1)
Sodium: 145 mmol/L (ref 135–145)
Total Bilirubin: 1.8 mg/dL — ABNORMAL HIGH (ref 0.3–1.2)
Total Protein: 6.7 g/dL (ref 6.5–8.1)

## 2021-05-05 LAB — CBC WITH DIFFERENTIAL/PLATELET
Abs Immature Granulocytes: 0.06 10*3/uL (ref 0.00–0.07)
Basophils Absolute: 0 10*3/uL (ref 0.0–0.1)
Basophils Relative: 0 %
Eosinophils Absolute: 0 10*3/uL (ref 0.0–0.5)
Eosinophils Relative: 0 %
HCT: 50.2 % (ref 39.0–52.0)
Hemoglobin: 16.4 g/dL (ref 13.0–17.0)
Immature Granulocytes: 1 %
Lymphocytes Relative: 8 %
Lymphs Abs: 1 10*3/uL (ref 0.7–4.0)
MCH: 28.2 pg (ref 26.0–34.0)
MCHC: 32.7 g/dL (ref 30.0–36.0)
MCV: 86.3 fL (ref 80.0–100.0)
Monocytes Absolute: 0.9 10*3/uL (ref 0.1–1.0)
Monocytes Relative: 8 %
Neutro Abs: 10.4 10*3/uL — ABNORMAL HIGH (ref 1.7–7.7)
Neutrophils Relative %: 83 %
Platelets: 164 10*3/uL (ref 150–400)
RBC: 5.82 MIL/uL — ABNORMAL HIGH (ref 4.22–5.81)
RDW: 16.3 % — ABNORMAL HIGH (ref 11.5–15.5)
WBC: 12.5 10*3/uL — ABNORMAL HIGH (ref 4.0–10.5)
nRBC: 0 % (ref 0.0–0.2)

## 2021-05-05 LAB — GLUCOSE, CAPILLARY
Glucose-Capillary: 111 mg/dL — ABNORMAL HIGH (ref 70–99)
Glucose-Capillary: 120 mg/dL — ABNORMAL HIGH (ref 70–99)
Glucose-Capillary: 133 mg/dL — ABNORMAL HIGH (ref 70–99)
Glucose-Capillary: 95 mg/dL (ref 70–99)

## 2021-05-05 LAB — MAGNESIUM: Magnesium: 1.7 mg/dL (ref 1.7–2.4)

## 2021-05-05 LAB — PHOSPHORUS: Phosphorus: 1.3 mg/dL — ABNORMAL LOW (ref 2.5–4.6)

## 2021-05-05 MED ORDER — POTASSIUM CHLORIDE 10 MEQ/100ML IV SOLN
10.0000 meq | INTRAVENOUS | Status: AC
  Administered 2021-05-05 (×4): 10 meq via INTRAVENOUS
  Filled 2021-05-05 (×2): qty 100

## 2021-05-05 NOTE — Progress Notes (Signed)
PROGRESS NOTE    Michael Lutz  RWE:[id number] DOB: 14-Sep-1945 DOA: 04/19/2021 PCP: Kathyrn Lass, MD   Brief Narrative:  76 y.o. WM PMHX  HTN, HLD, PVD, AAA s/p EVAR, prostate cancer, bipolar disorder with anxiety on chronic Klonopin, chronic back pain using fentanyl patch, s/p spinal cord stimulator memory issues who presented to the ED for evaluation of altered mental status.  Per patient's sister, patient lives alone and does have chronic memory issues although at baseline is fully alert and oriented, lucid, and answers questions appropriately.  Noted to be acutely confused on 05/01/2021 with difficulty formulating sentences as well as poor p.o. intake over the previous few days, multiple medications noted around the house and spilled.  UDS positive for benzodiazepines.  Assessment & Plan:   Principal Problem:   Acute encephalopathy Active Problems:   Benign hypertension   Bipolar affective disorder (HCC)   Chronic pain syndrome   Pure hypercholesterolemia   At risk for delirium   Morbidly obese (HCC)   Chronic diastolic CHF (congestive heart failure) (HCC)   S/P insertion of spinal cord stimulator   Candidal intertrigo  Acute encephalopathy-toxic versus metabolic (present on admission), minimally improving High risk for polypharmacy Rule out withdrawal versus incidental/accidental overdose, POA -Likely multifactorial in the setting of poor p.o. intake, polypharmacy and likely baseline cognitive impairment with high risk of sundowning and delirium given age comorbid conditions and home medications -CT head negative at intake -MRI unable to be performed due to spinal stimulator -Hold off on lumbar puncture given agitation, patient remains afebrile with negative procalcitonin -Home medications likely include Klonopin, Robaxin, Seroquel, trazodone, venlafaxine -which are currently being held -attempting to get finalized home medicine list from sister for pharmacy to verify -Continue  supportive care including benzodiazepines and multivitamins in the setting of malnutrition, low-dose morphine as needed given patient chronically on fentanyl to avoid further withdrawal symptoms -Titrate benzodiazepines aggressively given lifelong prescriptions and likely high tolerance as well as high risk for withdrawals   Leukocytosis, likely reactive -Sepsis ruled out given no overt sources  -Remains afebrile with minimal white count, negative procalcitonin -Hold LP until patient is able to tolerate the procedure more safely  Bipolar affective disorder ([place])- (present on admission) -Resume home medications once able to take p.o. safely, may sideline psych for assistance with reinitiating patient's multiple medications in the setting of likely polypharmacy  Chronic diastolic CHF Hypertension, essential -Continue to follow I's and O's Daily weights -Continue metoprolol, hydralazine as needed; transition back to home meds when able to take p.o. safely  A-fib new onset, likely provoked -Rate controlled, follow rhythm -Continue metoprolol IV as above  Chronic pain syndrome/Chronic Opioid withdrawal- (present on admission) -Chronically on fentanyl patch -Continue low-dose morphine to avoid withdrawals given above mental status   Pure hypercholesterolemia- (present on admission) -elevated LDL, cholesterol; Low HDL Hypokalemia Follow repeat labs -replete as appropriate Morbidly obese (BMI 40.1 kg /m)-Address with PCP as outpatient Candidal intertrigo -2/21 Clotrimazole 1% cream BID to affected area  DVT prophylaxis: Lovenox Code Status: Full Family Communication: None present  Status is: Inpatient  Dispo: The patient is from: Home              Anticipated d/c is to: To be determined              Anticipated d/c date is: 72-96 hours              Patient currently not medically stable for discharge  Consultants:  None  Procedures:  None  Antimicrobials:  Clotrimazole  cream  Subjective: No acute issues or events overnight, patient continues to be altered tolerating IV medications quite well but unable to take p.o. or participate in review of systems or physical exam  Objective: Vitals:   05/04/21 0629 05/04/21 1201 05/04/21 2032 05/05/21 0555  BP: (!) 162/96 (!) 199/115 (!) 147/109 (!) 134/92  Pulse: 82 91 92 60  Resp: 20 (!) 22 (!) 21 20  Temp: 98.7 F (37.1 C) 99.6 F (37.6 C) (!) 97.4 F (36.3 C) 97.8 F (36.6 C)  TempSrc: Oral Oral Oral Oral  SpO2: 96% 94% 95% 95%  Weight:        Intake/Output Summary (Last 24 hours) at 05/05/2021 0742 Last data filed at 05/05/2021 0640 Gross per 24 hour  Intake 1259.96 ml  Output 400 ml  Net 859.96 ml    Filed Weights   05/10/2021 2014 05/04/21 0556  Weight: 119.9 kg 116.7 kg    Examination:  General: Resting comfortably, somnolent, poorly arousable but protecting airway.  Able to answer simple question with name and date of birth today HEENT: Pupils moderately dilated but reactive Lungs: Without overt wheeze rhonchi or rales Heart: Irregularly Giller Abdomen:  Soft, nontender, nondistended.  Without guarding or rebound. Extremities: Without cyanosis, clubbing, edema, or obvious deformity.  Data Reviewed: I have personally reviewed following labs and imaging studies  CBC: Recent Labs  Lab 05/05/2021 1615 05/03/21 0410 05/04/21 0354  WBC 11.1* 13.8* 14.0*  NEUTROABS 8.9*  --  11.8*  HGB 16.2 16.1 15.8  HCT 49.5 48.7 48.5  MCV 85.8 85.0 85.8  PLT 153 155 277    Basic Metabolic Panel: Recent Labs  Lab 04/23/2021 1615 05/03/21 0410 05/04/21 0354  NA 139 141 141  K 3.4* 3.1* 3.3*  CL 108 107 108  CO2 19* 22 22  GLUCOSE 103* 81 97  BUN 24* 21 19  CREATININE 1.13 1.12 0.96  CALCIUM 9.2 8.8* 8.6*  MG  --  1.7 1.7  PHOS  --   --  1.5*    GFR: CrCl cannot be calculated (Unknown ideal weight.). Liver Function Tests: Recent Labs  Lab 04/30/2021 1615 05/04/21 0354  AST 34 51*  ALT  19 28  ALKPHOS 50 53  BILITOT 1.3* 1.5*  PROT 7.3 6.8  ALBUMIN 3.9 3.7    No results for input(s): LIPASE, AMYLASE in the last 168 hours. Recent Labs  Lab 04/23/2021 1725  AMMONIA 35    Coagulation Profile: No results for input(s): INR, PROTIME in the last 168 hours. Cardiac Enzymes: No results for input(s): CKTOTAL, CKMB, CKMBINDEX, TROPONINI in the last 168 hours. BNP (last 3 results) No results for input(s): PROBNP in the last 8760 hours. HbA1C: Recent Labs    05/03/21 1529  HGBA1C 5.3    CBG: Recent Labs  Lab 05/04/21 0551 05/04/21 1211 05/04/21 1818 05/05/21 0009 05/05/21 0659  GLUCAP 107* 91 107* 95 120*    Lipid Profile: Recent Labs    05/04/21 0354  CHOL 204*  HDL 26*  LDLCALC 158*  TRIG 99  CHOLHDL 7.8    Thyroid Function Tests: No results for input(s): TSH, T4TOTAL, FREET4, T3FREE, THYROIDAB in the last 72 hours. Anemia Panel: Recent Labs    05/03/21 0410  VITAMINB12 488    Sepsis Labs: Recent Labs  Lab 04/17/2021 1625 05/03/21 1541 05/03/21 1620 05/03/21 2010 05/04/21 0354  PROCALCITON  --  <0.10  --   --  <0.10  LATICACIDVEN 1.2  --  1.3 1.2  --      Recent Results (from the past 240 hour(s))  Resp Panel by RT-PCR (Flu A&B, Covid) Nasopharyngeal Swab     Status: None   Collection Time: 04/13/2021  7:03 PM   Specimen: Nasopharyngeal Swab; Nasopharyngeal(NP) swabs in vial transport medium  Result Value Ref Range Status   SARS Coronavirus 2 by RT PCR NEGATIVE NEGATIVE Final    Comment: (NOTE) SARS-CoV-2 target nucleic acids are NOT DETECTED.  The SARS-CoV-2 RNA is generally detectable in upper respiratory specimens during the acute phase of infection. The lowest concentration of SARS-CoV-2 viral copies this assay can detect is 138 copies/mL. A negative result does not preclude SARS-Cov-2 infection and should not be used as the sole basis for treatment or other patient management decisions. A negative result may occur with   improper specimen collection/handling, submission of specimen other than nasopharyngeal swab, presence of viral mutation(s) within the areas targeted by this assay, and inadequate number of viral copies(<138 copies/mL). A negative result must be combined with clinical observations, patient history, and epidemiological information. The expected result is Negative.  Fact Sheet for Patients:  EntrepreneurPulse.com.au  Fact Sheet for Healthcare Providers:  IncredibleEmployment.be  This test is no t yet approved or cleared by the [place] FDA and  has been authorized for detection and/or diagnosis of SARS-CoV-2 by FDA under an Emergency Use Authorization (EUA). This EUA will remain  in effect (meaning this test can be used) for the duration of the COVID-19 declaration under Section 564(b)(1) of the Act, 21 U.S.C.section 360bbb-3(b)(1), unless the authorization is terminated  or revoked sooner.       Influenza A by PCR NEGATIVE NEGATIVE Final   Influenza B by PCR NEGATIVE NEGATIVE Final    Comment: (NOTE) The Xpert Xpress SARS-CoV-2/FLU/RSV plus assay is intended as an aid in the diagnosis of influenza from Nasopharyngeal swab specimens and should not be used as a sole basis for treatment. Nasal washings and aspirates are unacceptable for Xpert Xpress SARS-CoV-2/FLU/RSV testing.  Fact Sheet for Patients: EntrepreneurPulse.com.au  Fact Sheet for Healthcare Providers: IncredibleEmployment.be  This test is not yet approved or cleared by the [place] FDA and has been authorized for detection and/or diagnosis of SARS-CoV-2 by FDA under an Emergency Use Authorization (EUA). This EUA will remain in effect (meaning this test can be used) for the duration of the COVID-19 declaration under Section 564(b)(1) of the Act, 21 U.S.C. section 360bbb-3(b)(1), unless the authorization is terminated  or revoked.  Performed at [facility], [place] [address], [place], [place] [address]   Culture, blood (routine x 2)     Status: None (Preliminary result)   Collection Time: 05/03/21  3:28 PM   Specimen: Right Antecubital; Blood  Result Value Ref Range Status   Specimen Description RIGHT ANTECUBITAL  Final   Special Requests   Final    BOTTLES DRAWN AEROBIC AND ANAEROBIC Blood Culture adequate volume   Culture   Final    NO GROWTH < 24 HOURS Performed at [facility], [place] [address], [place], [place] [address]    Report Status PENDING  Incomplete  Culture, blood (routine x 2)     Status: None (Preliminary result)   Collection Time: 05/03/21  3:29 PM   Specimen: Left Antecubital; Blood  Result Value Ref Range Status   Specimen Description LEFT ANTECUBITAL  Final   Special Requests   Final    BOTTLES DRAWN AEROBIC AND ANAEROBIC Blood Culture adequate volume  Culture   Final    NO GROWTH < 24 HOURS Performed at [facility], [place] [address] [place], [place] [address]    Report Status PENDING  Incomplete     Radiology Studies: No results found.  Scheduled Meds:  clotrimazole   Topical BID   enoxaparin (LOVENOX) injection  40 mg Subcutaneous Y59M   folic acid  1 mg Oral Daily   hydrALAZINE  5 mg Intravenous Q8H   LORazepam  0-4 mg Intravenous Q4H   Followed by   LORazepam  0-4 mg Intravenous Q8H   metoprolol tartrate  5 mg Intravenous Q8H   multivitamin with minerals  1 tablet Oral Daily   tamsulosin  0.4 mg Oral QHS   thiamine  100 mg Oral Daily   Or   thiamine  100 mg Intravenous Daily   Continuous Infusions:  dextrose 5 % and 0.9% NaCl 75 mL/hr at 05/05/21 0048   methocarbamol (ROBAXIN) IV      LOS: 2 days   Time spent: 35min  Emanuel Campos C Jaquavion Mccannon, DO [facility]  If 7PM-7AM, please contact night-coverage [email or web address]  05/05/2021, 7:42 AM

## 2021-05-06 ENCOUNTER — Inpatient Hospital Stay (HOSPITAL_COMMUNITY): Payer: PPO

## 2021-05-06 DIAGNOSIS — G9340 Encephalopathy, unspecified: Secondary | ICD-10-CM | POA: Diagnosis not present

## 2021-05-06 LAB — CBC WITH DIFFERENTIAL/PLATELET
Abs Immature Granulocytes: 0.06 10*3/uL (ref 0.00–0.07)
Basophils Absolute: 0.1 10*3/uL (ref 0.0–0.1)
Basophils Relative: 0 %
Eosinophils Absolute: 0.1 10*3/uL (ref 0.0–0.5)
Eosinophils Relative: 1 %
HCT: 51 % (ref 39.0–52.0)
Hemoglobin: 16.3 g/dL (ref 13.0–17.0)
Immature Granulocytes: 1 %
Lymphocytes Relative: 8 %
Lymphs Abs: 1 10*3/uL (ref 0.7–4.0)
MCH: 28.1 pg (ref 26.0–34.0)
MCHC: 32 g/dL (ref 30.0–36.0)
MCV: 87.9 fL (ref 80.0–100.0)
Monocytes Absolute: 0.9 10*3/uL (ref 0.1–1.0)
Monocytes Relative: 7 %
Neutro Abs: 10.8 10*3/uL — ABNORMAL HIGH (ref 1.7–7.7)
Neutrophils Relative %: 83 %
Platelets: 144 10*3/uL — ABNORMAL LOW (ref 150–400)
RBC: 5.8 MIL/uL (ref 4.22–5.81)
RDW: 16.4 % — ABNORMAL HIGH (ref 11.5–15.5)
WBC: 12.9 10*3/uL — ABNORMAL HIGH (ref 4.0–10.5)
nRBC: 0 % (ref 0.0–0.2)

## 2021-05-06 LAB — POCT I-STAT 7, (LYTES, BLD GAS, ICA,H+H)
Acid-base deficit: 5 mmol/L — ABNORMAL HIGH (ref 0.0–2.0)
Bicarbonate: 21 mmol/L (ref 20.0–28.0)
Calcium, Ion: 1.18 mmol/L (ref 1.15–1.40)
HCT: 46 % (ref 39.0–52.0)
Hemoglobin: 15.6 g/dL (ref 13.0–17.0)
O2 Saturation: 100 %
Patient temperature: 98.6
Potassium: 3.3 mmol/L — ABNORMAL LOW (ref 3.5–5.1)
Sodium: 149 mmol/L — ABNORMAL HIGH (ref 135–145)
TCO2: 22 mmol/L (ref 22–32)
pCO2 arterial: 40.2 mmHg (ref 32–48)
pH, Arterial: 7.326 — ABNORMAL LOW (ref 7.35–7.45)
pO2, Arterial: 408 mmHg — ABNORMAL HIGH (ref 83–108)

## 2021-05-06 LAB — MRSA NEXT GEN BY PCR, NASAL: MRSA by PCR Next Gen: NOT DETECTED

## 2021-05-06 LAB — COMPREHENSIVE METABOLIC PANEL
ALT: 35 U/L (ref 0–44)
ALT: 48 U/L — ABNORMAL HIGH (ref 0–44)
AST: 40 U/L (ref 15–41)
AST: 56 U/L — ABNORMAL HIGH (ref 15–41)
Albumin: 3.3 g/dL — ABNORMAL LOW (ref 3.5–5.0)
Albumin: 2.9 g/dL — ABNORMAL LOW (ref 3.5–5.0)
Alkaline Phosphatase: 55 U/L (ref 38–126)
Alkaline Phosphatase: 59 U/L (ref 38–126)
Anion gap: 11 (ref 5–15)
Anion gap: 18 — ABNORMAL HIGH (ref 5–15)
BUN: 20 mg/dL (ref 8–23)
BUN: 26 mg/dL — ABNORMAL HIGH (ref 8–23)
CO2: 20 mmol/L — ABNORMAL LOW (ref 22–32)
CO2: 15 mmol/L — ABNORMAL LOW (ref 22–32)
Calcium: 8.6 mg/dL — ABNORMAL LOW (ref 8.9–10.3)
Calcium: 8.1 mg/dL — ABNORMAL LOW (ref 8.9–10.3)
Chloride: 115 mmol/L — ABNORMAL HIGH (ref 98–111)
Chloride: 114 mmol/L — ABNORMAL HIGH (ref 98–111)
Creatinine, Ser: 1.04 mg/dL (ref 0.61–1.24)
Creatinine, Ser: 1.38 mg/dL — ABNORMAL HIGH (ref 0.61–1.24)
GFR, Estimated: >60 mL/min (ref >60)
GFR, Estimated: 53 mL/min — ABNORMAL LOW (ref >60)
Glucose, Bld: 121 mg/dL — ABNORMAL HIGH (ref 70–99)
Glucose, Bld: 216 mg/dL — ABNORMAL HIGH (ref 70–99)
Potassium: 2.8 mmol/L — ABNORMAL LOW (ref 3.5–5.1)
Potassium: 3.7 mmol/L (ref 3.5–5.1)
Sodium: 146 mmol/L — ABNORMAL HIGH (ref 135–145)
Sodium: 147 mmol/L — ABNORMAL HIGH (ref 135–145)
Total Bilirubin: 3.1 mg/dL — ABNORMAL HIGH (ref 0.3–1.2)
Total Bilirubin: 2.6 mg/dL — ABNORMAL HIGH (ref 0.3–1.2)
Total Protein: 6.7 g/dL (ref 6.5–8.1)
Total Protein: 6.1 g/dL — ABNORMAL LOW (ref 6.5–8.1)

## 2021-05-06 LAB — GLUCOSE, CAPILLARY
Glucose-Capillary: 126 mg/dL — ABNORMAL HIGH (ref 70–99)
Glucose-Capillary: 126 mg/dL — ABNORMAL HIGH (ref 70–99)
Glucose-Capillary: 139 mg/dL — ABNORMAL HIGH (ref 70–99)
Glucose-Capillary: 171 mg/dL — ABNORMAL HIGH (ref 70–99)
Glucose-Capillary: 163 mg/dL — ABNORMAL HIGH (ref 70–99)

## 2021-05-06 LAB — MAGNESIUM: Magnesium: 2.1 mg/dL (ref 1.7–2.4)

## 2021-05-06 LAB — TROPONIN I (HIGH SENSITIVITY): Troponin I (High Sensitivity): 75 ng/L — ABNORMAL HIGH (ref <18)

## 2021-05-06 LAB — LACTIC ACID, PLASMA: Lactic Acid, Venous: 7.2 mmol/L (ref 0.5–1.9)

## 2021-05-06 MED ORDER — ORAL CARE MOUTH RINSE
15.0000 mL | OROMUCOSAL | Status: DC
  Administered 2021-05-07 (×6): 15 mL via OROMUCOSAL

## 2021-05-06 MED ORDER — FENTANYL BOLUS VIA INFUSION
25.0000 ug | INTRAVENOUS | Status: DC | PRN
  Administered 2021-05-06: 50 ug via INTRAVENOUS
  Administered 2021-05-07: 100 ug via INTRAVENOUS
  Administered 2021-05-07: 50 ug via INTRAVENOUS
  Administered 2021-05-07 (×2): 100 ug via INTRAVENOUS
  Administered 2021-05-07: 50 ug via INTRAVENOUS
  Filled 2021-05-06: qty 100

## 2021-05-06 MED ORDER — DOCUSATE SODIUM 50 MG/5ML PO LIQD
100.0000 mg | Freq: Two times a day (BID) | ORAL | Status: DC
  Administered 2021-05-07: 100 mg
  Filled 2021-05-06 (×2): qty 10

## 2021-05-06 MED ORDER — FENTANYL 2500MCG IN NS 250ML (10MCG/ML) PREMIX INFUSION
25.0000 ug/h | INTRAVENOUS | Status: DC
  Administered 2021-05-06: 25 ug/h via INTRAVENOUS
  Filled 2021-05-06 (×2): qty 250

## 2021-05-06 MED ORDER — POLYETHYLENE GLYCOL 3350 17 G PO PACK
17.0000 g | PACK | Freq: Every day | ORAL | Status: DC
  Administered 2021-05-07: 17 g
  Filled 2021-05-06: qty 1

## 2021-05-06 MED ORDER — PANTOPRAZOLE SODIUM 40 MG IV SOLR
40.0000 mg | Freq: Every day | INTRAVENOUS | Status: DC
  Administered 2021-05-07: 40 mg via INTRAVENOUS
  Filled 2021-05-06: qty 10

## 2021-05-06 MED ORDER — MORPHINE SULFATE (PF) 2 MG/ML IV SOLN
0.5000 mg | INTRAVENOUS | Status: DC | PRN
  Administered 2021-05-06 (×4): 1 mg via INTRAVENOUS
  Filled 2021-05-06 (×3): qty 1

## 2021-05-06 MED ORDER — CHLORHEXIDINE GLUCONATE 0.12% ORAL RINSE (MEDLINE KIT)
15.0000 mL | Freq: Two times a day (BID) | OROMUCOSAL | Status: DC
  Administered 2021-05-06 – 2021-05-07 (×2): 15 mL via OROMUCOSAL

## 2021-05-06 MED ORDER — FAMOTIDINE IN NACL 20-0.9 MG/50ML-% IV SOLN: 20.0000 mg | INTRAVENOUS | Status: DC

## 2021-05-06 MED ORDER — DIAZEPAM 5 MG/ML IJ SOLN
2.5000 mg | INTRAMUSCULAR | Status: DC | PRN
  Administered 2021-05-06 (×2): 2.5 mg via INTRAVENOUS
  Filled 2021-05-06 (×2): qty 2

## 2021-05-06 MED ORDER — MAGNESIUM SULFATE 2 GM/50ML IV SOLN
2.0000 g | Freq: Once | INTRAVENOUS | Status: AC
  Administered 2021-05-06: 2 g via INTRAVENOUS
  Filled 2021-05-06: qty 50

## 2021-05-06 MED ORDER — FENTANYL CITRATE (PF) 100 MCG/2ML IJ SOLN
25.0000 ug | INTRAMUSCULAR | Status: DC | PRN
  Administered 2021-05-06: 100 ug via INTRAVENOUS
  Filled 2021-05-06 (×2): qty 2

## 2021-05-06 MED ORDER — CHLORHEXIDINE GLUCONATE CLOTH 2 % EX PADS
6.0000 | MEDICATED_PAD | Freq: Every day | CUTANEOUS | Status: DC
  Administered 2021-05-06: 6 via TOPICAL

## 2021-05-06 MED ORDER — CEFTRIAXONE SODIUM 2 G IJ SOLR
2.0000 g | INTRAMUSCULAR | Status: DC
  Administered 2021-05-07: 2 g via INTRAVENOUS
  Filled 2021-05-06: qty 20

## 2021-05-06 MED ORDER — LORAZEPAM 2 MG/ML IJ SOLN
2.0000 mg | INTRAMUSCULAR | Status: AC
  Administered 2021-05-06: 2 mg via INTRAVENOUS

## 2021-05-06 MED ORDER — POTASSIUM CHLORIDE 10 MEQ/100ML IV SOLN
10.0000 meq | Freq: Once | INTRAVENOUS | Status: AC
Start: 2021-05-06 — End: 2021-05-07
  Administered 2021-05-06: 10 meq via INTRAVENOUS

## 2021-05-06 MED ORDER — POTASSIUM CHLORIDE 10 MEQ/100ML IV SOLN
10.0000 meq | INTRAVENOUS | Status: DC
  Administered 2021-05-06 (×4): 10 meq via INTRAVENOUS
  Filled 2021-05-06 (×3): qty 100

## 2021-05-06 MED ORDER — FENTANYL CITRATE (PF) 100 MCG/2ML IJ SOLN
25.0000 ug | Freq: Once | INTRAMUSCULAR | Status: AC
  Administered 2021-05-06: 25 ug via INTRAVENOUS

## 2021-05-06 MED ORDER — FENTANYL CITRATE (PF) 100 MCG/2ML IJ SOLN: 25.0000 ug | INTRAMUSCULAR | Status: DC | PRN

## 2021-05-06 MED ORDER — KCL IN DEXTROSE-NACL 10-5-0.45 MEQ/L-%-% IV SOLN
INTRAVENOUS | Status: DC
  Filled 2021-05-06 (×3): qty 1000

## 2021-05-06 MED ORDER — LORAZEPAM 2 MG/ML IJ SOLN: 1.0000 mg | INTRAMUSCULAR | Status: DC | PRN

## 2021-05-06 MED ORDER — LORAZEPAM 1 MG PO TABS
1.0000 mg | ORAL_TABLET | ORAL | Status: DC | PRN
Start: 2021-05-06 — End: 2021-05-06

## 2021-05-06 NOTE — Progress Notes (Signed)
°   05/06/21 2105  Clinical Encounter Type  Visited With Patient not available  Visit Type Code  Referral From Nurse  Consult/Referral To None   Chaplain responded to a code blue patient was under medical care. No family was present however they were contacted and on the way. Chaplain met with family member and brought her to patient's new location. Family member was briefed by the doctors and was able to visit with patient. I checked on family member later in the evening to see that she was doing ok.   [place] [facility]  [phone]

## 2021-05-06 NOTE — Progress Notes (Signed)
°   05/06/21 0841  Assess: MEWS Score  Temp 98.4 F (36.9 C)  BP (!) 183/98  Pulse Rate (!) 50  Resp (!) 40  Level of Consciousness Alert  SpO2 91 %  O2 Device Room Air  Assess: MEWS Score  MEWS Temp 0  MEWS Systolic 0  MEWS Pulse 1  MEWS RR 3  MEWS LOC 0  MEWS Score 4  MEWS Score Color Red  Treat  MEWS Interventions Administered scheduled meds/treatments;Administered prn meds/treatments  Pain Scale Faces  Pain Score 0  Pain Intervention(s) Repositioned;Environmental changes  Escalate  MEWS: Escalate Red: discuss with charge nurse/RN and provider, consider discussing with RRT  Notify: Charge Nurse/RN  Name of Charge Nurse/RN Notified candice  Date Charge Nurse/RN Notified 05/06/21  Notify: Provider  Provider Name/Title Avon Gully  Date Provider Notified 05/06/21  Time Provider Notified 0820  Notification Type Face-to-face  Notification Reason Change in status  Provider response At bedside  Document  Patient Outcome Other (Comment) (waiting for labs)

## 2021-05-06 NOTE — Progress Notes (Signed)
PROGRESS NOTE    Michael Lutz  BZJ:[id number] DOB: Jan 24, 1946 DOA: 04/21/2021 PCP: Kathyrn Lass, MD   Brief Narrative:  76 y.o. WM PMHX  HTN, HLD, PVD, AAA s/p EVAR, prostate cancer, bipolar disorder with anxiety on chronic Klonopin, chronic back pain using fentanyl patch, s/p spinal cord stimulator memory issues who presented to the ED for evaluation of altered mental status. Per patient's sister, patient lives alone and does have chronic memory issues although at baseline is fully alert and oriented, lucid, and answers questions appropriately.  Noted to be acutely confused on 05/01/2021 with difficulty formulating sentences as well as poor p.o. intake over the previous few days, multiple medications noted around the house and spilled.  UDS positive for benzodiazepines.  Assessment & Plan:   Principal Problem:   Acute encephalopathy Active Problems:   Benign hypertension   Bipolar affective disorder (HCC)   Chronic pain syndrome   Pure hypercholesterolemia   At risk for delirium   Morbidly obese (HCC)   Chronic diastolic CHF (congestive heart failure) (HCC)   S/P insertion of spinal cord stimulator   Candidal intertrigo  Acute encephalopathy-toxic versus metabolic (present on admission), minimally improving High risk for polypharmacy Rule out withdrawal versus incidental/accidental overdose, POA -Psych requested for consult given complex home med rec, concern for polypharmacy withdrawal versus overdose (Klonopin, Robaxin, seroquel, trazodone, fentanyl, venlafaxine, possibly gabapentin/others as well) -Continue diazepam/morphine for now per discussion with psych -CT negative; MRI unable to be obtained (spinal stimulator not compatible) -Multifactorial in the setting of poor p.o. intake, polypharmacy and likely baseline cognitive impairment with high risk of sundowning and delirium given age comorbid conditions and home medications (Klonopin, Robaxin, seroquel, trazodone, fentanyl,  venlafaxine)  -At this point it is very clear that patient has not been compliant with her medications per discussion with sister who has evaluated his home with multiple pill bottles and bags of mixed medications laying around the house, it is unclear whether or not patient is taking any of his home medications or has been taking extra medications as he has multiple providers including PCP, psych, pain management with multiple prescriptions and pharmacies only further convoluting his history.   Leukocytosis, likely reactive -Sepsis ruled out given no overt sources  -Remains afebrile with minimal white count, negative procalcitonin -Hold LP until patient is able to tolerate the procedure more safely  Bipolar affective disorder ([place])- (present on admission) -Resume home medications once able to take p.o. safely, may sideline psych for assistance with reinitiating patient's multiple medications in the setting of likely polypharmacy  Chronic diastolic CHF Hypertension, essential -Continue to follow I's and O's Daily weights -Continue metoprolol, hydralazine as needed; transition back to home meds when able to take p.o. safely  A-fib new onset, likely provoked -Rate controlled, follow rhythm -Continue metoprolol IV as above  Chronic pain syndrome/Chronic Opioid withdrawal- (present on admission) -Chronically on fentanyl patch -Continue low-dose morphine to avoid withdrawals given above mental status   Pure hypercholesterolemia- (present on admission) - elevated LDL, Cholesterol; Low HDL Hypokalemia Follow repeat labs -replete as appropriate Morbidly obese (BMI 40.1 kg /m)-Address with PCP as outpatient Candidal intertrigo -2/21 Clotrimazole 1% cream BID to affected area  DVT prophylaxis: Lovenox Code Status: Full Family Communication: None present  Status is: Inpatient  Dispo: DIFFICULT DISPOSITION - House is unsafe per sister - unclear if he has capacity to make these decisions at  baseline. Certainly not able to make decisions at the time.  The patient is from: Home             Anticipated d/c is to: To be determined             Anticipated d/c date is: 72-96 hours             Patient currently not medically stable for discharge  Consultants:  None  Procedures:  None  Antimicrobials:  Clotrimazole cream  Subjective: Rapid response earlier this morning due to ongoing agitation, improving with increased benzos and analgesics -review of systems remains limited  Objective: Vitals:   05/04/21 2032 05/05/21 0555 05/05/21 2106 05/06/21 0435  BP:  (!) 134/92 (!) 174/77 (!) 169/80  Pulse: 92 60 78 76  Resp: (!) 21 20 18 19   Temp: (!) 97.4 F (36.3 C) 97.8 F (36.6 C) 98.5 F (36.9 C) 98.5 F (36.9 C)  TempSrc: Oral Oral    SpO2: 95% 95% 95%   Weight:        Intake/Output Summary (Last 24 hours) at 05/06/2021 0800 Last data filed at 05/06/2021 0600 Gross per 24 hour  Intake 863.3 ml  Output 1400 ml  Net -536.7 ml    Filed Weights   04/25/2021 2014 05/04/21 0556  Weight: 119.9 kg 116.7 kg    Examination:  General: Agitated, poorly arousable but protecting airway.  Unable to follow commands today HEENT: Pupils moderately dilated but reactive Lungs: Without overt wheeze rhonchi or rales Heart: Irregularly irregular Abdomen:  Soft, nontender, nondistended.  Without guarding or rebound. Extremities: Without cyanosis, clubbing, edema, or obvious deformity.  Data Reviewed: I have personally reviewed following labs and imaging studies  CBC: Recent Labs  Lab 05/03/2021 1615 05/03/21 0410 05/04/21 0354 05/05/21 0727 05/06/21 0450  WBC 11.1* 13.8* 14.0* 12.5* 12.9*  NEUTROABS 8.9*  --  11.8* 10.4* 10.8*  HGB 16.2 16.1 15.8 16.4 16.3  HCT 49.5 48.7 48.5 50.2 51.0  MCV 85.8 85.0 85.8 86.3 87.9  PLT 153 155 159 164 144*    Basic Metabolic Panel: Recent Labs  Lab 04/20/2021 1615 05/03/21 0410 05/04/21 0354 05/05/21 0727 05/06/21 0450   NA 139 141 141 145 146*  K 3.4* 3.1* 3.3* 2.8* 2.8*  CL 108 107 108 112* 115*  CO2 19* 22 22 22  20*  GLUCOSE 103* 81 97 113* 121*  BUN 24* 21 19 22 20   CREATININE 1.13 1.12 0.96 1.04 1.04  CALCIUM 9.2 8.8* 8.6* 8.6* 8.6*  MG  --  1.7 1.7 1.7  --   PHOS  --   --  1.5* 1.3*  --     GFR: CrCl cannot be calculated (Unknown ideal weight.). Liver Function Tests: Recent Labs  Lab 05/08/2021 1615 05/04/21 0354 05/05/21 0727 05/06/21 0450  AST 34 51* 42* 40  ALT 19 28 32 35  ALKPHOS 50 53 55 55  BILITOT 1.3* 1.5* 1.8* 3.1*  PROT 7.3 6.8 6.7 6.7  ALBUMIN 3.9 3.7 3.6 3.3*    No results for input(s): LIPASE, AMYLASE in the last 168 hours. Recent Labs  Lab 04/13/2021 1725  AMMONIA 35    Coagulation Profile: No results for input(s): INR, PROTIME in the last 168 hours. Cardiac Enzymes: No results for input(s): CKTOTAL, CKMB, CKMBINDEX, TROPONINI in the last 168 hours. BNP (last 3 results) No results for input(s): PROBNP in the last 8760 hours. HbA1C: Recent Labs    05/03/21 1529  HGBA1C 5.3    CBG: Recent Labs  Lab 05/05/21 0009 05/05/21 0659 05/05/21 1129 05/05/21 2106  05/06/21 0645  GLUCAP 95 120* 133* 111* 126*    Lipid Profile: Recent Labs    05/04/21 0354  CHOL 204*  HDL 26*  LDLCALC 158*  TRIG 99  CHOLHDL 7.8    Thyroid Function Tests: No results for input(s): TSH, T4TOTAL, FREET4, T3FREE, THYROIDAB in the last 72 hours. Anemia Panel: No results for input(s): VITAMINB12, FOLATE, FERRITIN, TIBC, IRON, RETICCTPCT in the last 72 hours.  Sepsis Labs: Recent Labs  Lab 04/22/2021 1625 05/03/21 1541 05/03/21 1620 05/03/21 2010 05/04/21 0354  PROCALCITON  --  <0.10  --   --  <0.10  LATICACIDVEN 1.2  --  1.3 1.2  --      Recent Results (from the past 240 hour(s))  Resp Panel by RT-PCR (Flu A&B, Covid) Nasopharyngeal Swab     Status: None   Collection Time: 04/26/2021  7:03 PM   Specimen: Nasopharyngeal Swab; Nasopharyngeal(NP) swabs in vial  transport medium  Result Value Ref Range Status   SARS Coronavirus 2 by RT PCR NEGATIVE NEGATIVE Final    Comment: (NOTE) SARS-CoV-2 target nucleic acids are NOT DETECTED.  The SARS-CoV-2 RNA is generally detectable in upper respiratory specimens during the acute phase of infection. The lowest concentration of SARS-CoV-2 viral copies this assay can detect is 138 copies/mL. A negative result does not preclude SARS-Cov-2 infection and should not be used as the sole basis for treatment or other patient management decisions. A negative result may occur with  improper specimen collection/handling, submission of specimen other than nasopharyngeal swab, presence of viral mutation(s) within the areas targeted by this assay, and inadequate number of viral copies(<138 copies/mL). A negative result must be combined with clinical observations, patient history, and epidemiological information. The expected result is Negative.  Fact Sheet for Patients:  EntrepreneurPulse.com.au  Fact Sheet for Healthcare Providers:  IncredibleEmployment.be  This test is no t yet approved or cleared by the [place] FDA and  has been authorized for detection and/or diagnosis of SARS-CoV-2 by FDA under an Emergency Use Authorization (EUA). This EUA will remain  in effect (meaning this test can be used) for the duration of the COVID-19 declaration under Section 564(b)(1) of the Act, 21 U.S.C.section 360bbb-3(b)(1), unless the authorization is terminated  or revoked sooner.       Influenza A by PCR NEGATIVE NEGATIVE Final   Influenza B by PCR NEGATIVE NEGATIVE Final    Comment: (NOTE) The Xpert Xpress SARS-CoV-2/FLU/RSV plus assay is intended as an aid in the diagnosis of influenza from Nasopharyngeal swab specimens and should not be used as a sole basis for treatment. Nasal washings and aspirates are unacceptable for Xpert Xpress SARS-CoV-2/FLU/RSV testing.  Fact  Sheet for Patients: EntrepreneurPulse.com.au  Fact Sheet for Healthcare Providers: IncredibleEmployment.be  This test is not yet approved or cleared by the [place] FDA and has been authorized for detection and/or diagnosis of SARS-CoV-2 by FDA under an Emergency Use Authorization (EUA). This EUA will remain in effect (meaning this test can be used) for the duration of the COVID-19 declaration under Section 564(b)(1) of the Act, 21 U.S.C. section 360bbb-3(b)(1), unless the authorization is terminated or revoked.  Performed at [facility], [place] [address], [place], [place] [address]   Culture, blood (routine x 2)     Status: None (Preliminary result)   Collection Time: 05/03/21  3:28 PM   Specimen: Right Antecubital; Blood  Result Value Ref Range Status   Specimen Description RIGHT ANTECUBITAL  Final   Special Requests  Final    BOTTLES DRAWN AEROBIC AND ANAEROBIC Blood Culture adequate volume   Culture   Final    NO GROWTH 2 DAYS Performed at [facility], [place] [address], [place], [place] [address]    Report Status PENDING  Incomplete  Culture, blood (routine x 2)     Status: None (Preliminary result)   Collection Time: 05/03/21  3:29 PM   Specimen: Left Antecubital; Blood  Result Value Ref Range Status   Specimen Description LEFT ANTECUBITAL  Final   Special Requests   Final    BOTTLES DRAWN AEROBIC AND ANAEROBIC Blood Culture adequate volume   Culture   Final    NO GROWTH 2 DAYS Performed at [facility], [place] [address] [place], [place] [address]    Report Status PENDING  Incomplete     Radiology Studies: No results found.  Scheduled Meds:  clotrimazole   Topical BID   enoxaparin (LOVENOX) injection  40 mg Subcutaneous I94W   folic acid  1 mg Oral Daily   hydrALAZINE  5 mg Intravenous Q8H   LORazepam  0-4 mg Intravenous Q8H   metoprolol tartrate  5 mg Intravenous Q8H   multivitamin with minerals  1  tablet Oral Daily   tamsulosin  0.4 mg Oral QHS   thiamine  100 mg Oral Daily   Or   thiamine  100 mg Intravenous Daily   Continuous Infusions:  dextrose 5 % and 0.9% NaCl 75 mL/hr at 05/06/21 0124   methocarbamol (ROBAXIN) IV 500 mg (05/06/21 0006)    LOS: 3 days   Time spent: 68min  Deshauna Cayson C Timathy Newberry, DO [facility]  If 7PM-7AM, please contact night-coverage [email or web address]  05/06/2021, 8:00 AM

## 2021-05-06 NOTE — Consult Note (Addendum)
NAME:  Michael Lutz, MRN:  [id number], DOB:  1945/06/24, LOS: 3 ADMISSION DATE:  05/03/2021, CONSULTATION DATE:  2/24 REFERRING MD:  Dr. Cyd Silence, CHIEF COMPLAINT:  post cardiac arrest   History of Present Illness:  Patient is a 76 year old male with pertinent PMH anxiety (clonazepam daily at home), bipolar 1 (Seroquel nightly), depression (Effexor), chronic back pain (fentanyl patch), chronic memory issues, GERD, chronic diastolic CHF (echo 06/979: LVEF 60 to 19%, grade 1 diastolic dysfunction), HTN, HLD, PVD, AAA s/p EVAR, prostate cancer presents to [facility] on 2/20 with AMS.  Per patient's sister, patient lives alone at home.  On 2/19 patient noted to be confused and speaking inappropriately.  Noted that patient not eating well for the past few days prior to hospital admission.  When family went to check on him he had pills scattered all over the floor.  Patient brought to [facility] ED on 2/20.  Upon arrival to [facility] ED, BP initially 179/106 and sats stable on room air.  Patient lethargic.  CT head negative for acute abnormality; chronic diffuse atrophy and mild chronic bilateral periventricular white matter small vessel ischemic changes appreciated.  UDS positive for benzos.  WBC 11.1.  Ammonia 35.  Ethanol WNL.  UA negative for UTI.  CXR with chronic increased interstitial markings bilaterally.  On 2/24, patient was experiencing agitation delirium and was given as needed Valium and morphine.  Around 9 PM, patient went into PEA (likely respiratory arrest).  Received about 12 minutes of CPR before ROSC.  Given epi and bicarb.  Patient intubated during CPR.  Patient brought to ICU.  PCCM consulted for ICU transfer and medical management.  Pertinent  Medical History   Past Medical History:  Diagnosis Date   AAA (abdominal aortic aneurysm) (HCC)    ADD (attention deficit disorder)    Anxiety    takes Clonazepam daily   Arthritis    left knee   Bipolar 1 disorder (HCC)    takes Seroquel nightly    Bipolar affective disorder ([place])    in remission on meds   Blood transfusion    several times   CHF (congestive heart failure) (HCC)    Chronic back pain    [facility]   Chronic back pain    scoliosis   Chronic low back pain 07/31/2013   Colon polyps    Dr Cristina Gong does a colonoscopy every 5 years   Constipation    takes Colace prn   Depression    takes Effexor daily   Dysrhythmia    palpitations- sees dr Irish Lack   Enlarged prostate    slightly   Full dentures    GERD (gastroesophageal reflux disease)    takes Zantac daily   Heart murmur    arrythmia   History of shingles    Hx of scabies Mid Feb 2013   Hx of seasonal allergies    Hyperlipidemia    takes Niacin daily and Lipitor daily   Hypertension    takes Amlodipine and Quinapril daily   Insomnia    takes Trazodone nightly   Joint pain    Leg pain 05-04-10   with walking   Memory loss    short and long term   Nocturia    Peripheral edema    takes Furosemide prn   Peripheral vascular disease (HCC)    s/p AAA stent   Prostate cancer ([place])    Urinary urgency    Wears glasses      Significant  Hospital Events: Including procedures, antibiotic start and stop dates in addition to other pertinent events   2/20 admitted to [facility] 2/24 patient PEA arrest on floors; ROSC 12 minutes; intubated; L pneumothorax post arrest likley due to rib fractures; CT placed  Interim History / Subjective:  Patient intubated on PRVC; patient breathing spontaneously in upper 30s Patient BP stable Patient moving spontaneously but not waking up or following commands; PERRL  Objective   Blood pressure (!) 170/110, pulse (!) 102, temperature 98.6 F (37 C), temperature source Axillary, resp. rate (!) 30, height 5' 7.99" (1.727 m), weight 116.7 kg, SpO2 96 %.    Vent Mode: PRVC FiO2 (%):  [100 %] 100 % Set Rate:  [20 bmp] 20 bmp Vt Set:  [540 mL] 540 mL PEEP:  [5 cmH20] 5 cmH20   Intake/Output Summary (Last  24 hours) at 05/06/2021 2238 Last data filed at 05/06/2021 1500 Gross per 24 hour  Intake 563.3 ml  Output 1600 ml  Net -1036.7 ml   Filed Weights   05/05/2021 2014 05/04/21 0556  Weight: 119.9 kg 116.7 kg    Examination: General:  critically ill appearing on mech vent HEENT: MM pink/moist; ETT in place Neuro: lethargic; moving spontaneously but not following commands; PERRL CV: s1s2, no m/r/g PULM:  dim BS bilaterally; on mech vent PRVC GI: soft, bsx4 active  Extremities: warm/dry, trace BLE edema  Skin: no rashes or lesions appreciated  Resolved Hospital Problem list     Assessment & Plan:  S/p PEA arrest: 12 minutes CPR until ROSC; given epi and bicarb; likely respiratory arrest P: -will admit to ICU w/ continuous telemetry monitoring -BP stable; no pressor requirements currently -check EKG -trend troponin and lactate -check ABG -check CMP, Mag, ionized calcium: replete electrolytes as needed -check cultures: bcx2, urine culture/UA, and trach culture -will start on broad spectrum antibiotics -check PCT -check CXR -check echo -consider CT head and EEG -TTM normothermia protocol in place  Acute respiratory failure s/p arrest Possible RML pneumonia Large left pneumothorax P: -Continue PRVC 6 to 8 cc/kg -Check ABG -Check CXR -Wean FiO2 for sats greater than 92% -Wean sedation for RASS 0 to -1 -VAP prevention in place -Daily SBT/SBT -Broad-spectrum antibiotics ordered -Check sputum culture, MRSA PCR, PCT -Left pigtail chest tube placed -chest tube to -20 cm H2O  Acute encephalopathy: Toxic vs. Metabolic High risk for polypharmacy: (Klonopin, Robaxin, seroquel, trazodone, fentanyl, venlafaxine) -UDS positive for benzos (takes clonazepam at home) -Patient takes multiple medications at home; unknown if overdose or withdraw -CT head negative for acute abnormality, ammonia, UA, ethanol negative P: -Patient sedated; wean sedation for RASS 0 to -1 -Limit sedating  meds -Frequent neurochecks -cont Thiamine, folate, MVI -Consider LP  Hypokalemia P: -Repeating K and mag -Trend BMP/mag; replete electrolytes as needed  Chronic diastolic CHF: echo 03/3084: LVEF 60 to 57%, grade 1 diastolic dysfunction HTN New onset A-fib HLD P: -Telemetry monitoring -Strict I's/O's; daily weights -cont ASA and statin -We will hold scheduled metoprolol/hydralazine; IV hydralazine as needed  Bipolar 1 disorder Depression/anxiety P: -Consider resuming home seroquel, wellbutrin, and venlafaxine  Chronic pain syndrome/chronic opioid withdrawal P: -Fentanyl drip while intubated; wean for RASS 0 to -1 -Will stop fentanyl patch -will hold lyrica, gabapentin, and robaxin  Candida intertrigo P: -Continue clotrimazole cream twice daily to affected area   Best Practice (right click and "Reselect all SmartList Selections" daily)   Diet/type: NPO w/ meds via tube DVT prophylaxis: prophylactic heparin  GI prophylaxis: PPI Lines: N/A  Foley:  N/A Code Status:  full code Last date of multidisciplinary goals of care discussion [2/24 spoke with daughter at bedside. She states that she wants everything done and to keep full code.]  Labs   CBC: Recent Labs  Lab 04/24/2021 1615 05/03/21 0410 05/04/21 0354 05/05/21 0727 05/06/21 0450 05/06/21 2232  WBC 11.1* 13.8* 14.0* 12.5* 12.9*  --   NEUTROABS 8.9*  --  11.8* 10.4* 10.8*  --   HGB 16.2 16.1 15.8 16.4 16.3 15.6  HCT 49.5 48.7 48.5 50.2 51.0 46.0  MCV 85.8 85.0 85.8 86.3 87.9  --   PLT 153 155 159 164 144*  --     Basic Metabolic Panel: Recent Labs  Lab 05/01/2021 1615 05/03/21 0410 05/04/21 0354 05/05/21 0727 05/06/21 0450 05/06/21 2232  NA 139 141 141 145 146* 149*  K 3.4* 3.1* 3.3* 2.8* 2.8* 3.3*  CL 108 107 108 112* 115*  --   CO2 19* 22 22 22  20*  --   GLUCOSE 103* 81 97 113* 121*  --   BUN 24* 21 19 22 20   --   CREATININE 1.13 1.12 0.96 1.04 1.04  --   CALCIUM 9.2 8.8* 8.6* 8.6* 8.6*  --    MG  --  1.7 1.7 1.7  --   --   PHOS  --   --  1.5* 1.3*  --   --    GFR: Estimated Creatinine Clearance: 76.1 mL/min (by C-G formula based on SCr of 1.04 mg/dL). Recent Labs  Lab 05/10/2021 1625 05/03/21 0410 05/03/21 1541 05/03/21 1620 05/03/21 2010 05/04/21 0354 05/05/21 0727 05/06/21 0450  PROCALCITON  --   --  <0.10  --   --  <0.10  --   --   WBC  --  13.8*  --   --   --  14.0* 12.5* 12.9*  LATICACIDVEN 1.2  --   --  1.3 1.2  --   --   --     Liver Function Tests: Recent Labs  Lab 04/15/2021 1615 05/04/21 0354 05/05/21 0727 05/06/21 0450  AST 34 51* 42* 40  ALT 19 28 32 35  ALKPHOS 50 53 55 55  BILITOT 1.3* 1.5* 1.8* 3.1*  PROT 7.3 6.8 6.7 6.7  ALBUMIN 3.9 3.7 3.6 3.3*   No results for input(s): LIPASE, AMYLASE in the last 168 hours. Recent Labs  Lab 04/23/2021 1725  AMMONIA 35    ABG    Component Value Date/Time   PHART 7.326 (L) 05/06/2021 2232   PCO2ART 40.2 05/06/2021 2232   PO2ART 408 (H) 05/06/2021 2232   HCO3 21.0 05/06/2021 2232   TCO2 22 05/06/2021 2232   ACIDBASEDEF 5.0 (H) 05/06/2021 2232   O2SAT 100 05/06/2021 2232     Coagulation Profile: No results for input(s): INR, PROTIME in the last 168 hours.  Cardiac Enzymes: No results for input(s): CKTOTAL, CKMB, CKMBINDEX, TROPONINI in the last 168 hours.  HbA1C: Hgb A1c MFr Bld  Date/Time Value Ref Range Status  05/03/2021 03:29 PM 5.3 4.8 - 5.6 % Final    Comment:    (NOTE)         Prediabetes: 5.7 - 6.4         Diabetes: >6.4         Glycemic control for adults with diabetes: <7.0     CBG: Recent Labs  Lab 05/05/21 2106 05/06/21 0645 05/06/21 0841 05/06/21 1227 05/06/21 2155  GLUCAP 111* 126* 126* 139* 171*    Review  of Systems:   Patient is encephalopathic and/or intubated. Therefore history has been obtained from chart review.    Past Medical History:  He,  has a past medical history of AAA (abdominal aortic aneurysm) ([place]), ADD (attention deficit disorder), Anxiety,  Arthritis, Bipolar 1 disorder ([place]), Bipolar affective disorder ([place]), Blood transfusion, CHF (congestive heart failure) ([place]), Chronic back pain, Chronic back pain, Chronic low back pain (07/31/2013), Colon polyps, Constipation, Depression, Dysrhythmia, Enlarged prostate, Full dentures, GERD (gastroesophageal reflux disease), Heart murmur, History of shingles, scabies (Mid Feb 2013), seasonal allergies, Hyperlipidemia, Hypertension, Insomnia, Joint pain, Leg pain (05-04-10), Memory loss, Nocturia, Peripheral edema, Peripheral vascular disease ([place]), Prostate cancer ([place]), Urinary urgency, and Wears glasses.   Surgical History:   Past Surgical History:  Procedure Laterality Date   ABDOMINAL AORTIC ANEURYSM REPAIR  11/2009   Stent graft repair   BACK SURGERY     thoracic spine   CARDIAC CATHETERIZATION     15 yrs ago   CHOLECYSTECTOMY     COLONOSCOPY     FOOT SURGERY     Multiple surgeries on right foot   GANGLION CYST EXCISION Right 06/07/2012   Procedure: DISTAL POLE EXCISION RIGHT WRIST;  Surgeon: Wynonia Sours, MD;  Location: [facility];  Service: Orthopedics;  Laterality: Right;   GOLD SEED IMPLANT N/A 09/08/2019   Procedure: GOLD SEED IMPLANT;  Surgeon: Franchot Gallo, MD;  Location: [facility];  Service: Urology;  Laterality: N/A;  30 MINS   LOWER EXTREMITY ANGIOGRAPHY Right 09/15/2020   Procedure: Lower Extremity Angiography;  Surgeon: Cherre Robins, MD;  Location: [facility];  Service: Cardiovascular;  Laterality: Right;   SHOULDER SURGERY     Right shoulder   SPACE OAR INSTILLATION N/A 09/08/2019   Procedure: SPACE OAR INSTILLATION;  Surgeon: Franchot Gallo, MD;  Location: [facility];  Service: Urology;  Laterality: N/A;   SPINAL CORD STIMULATOR IMPLANT  2009   NOVI [id number]  MRI UNSAFE   SPINAL FUSION  04/04/2011   Procedure: FUSION POSTERIOR SPINAL MULTILEVEL/SCOLIOSIS;  Surgeon: Gunnar Bulla, MD;  Location: [facility];  Service: Orthopedics;  Laterality: N/A;   repair thoracic and lumbar  pseudoarthrosis, revise spinal cord stimulator, extend thoracic fusion, iliac crest bone graft   SPINE SURGERY  12/2009   Spinal Fusion by Dr. Marcial Pacas   TESTICLE SURGERY     right testicle   TONSILLECTOMY     TURP VAPORIZATION     VASCULAR SURGERY     AAA stent repair     Social History:   reports that he quit smoking about 7 years ago. His smoking use included cigarettes. He has a 50.00 pack-year smoking history. He has never used smokeless tobacco. He reports that he does not drink alcohol and does not use drugs.   Family History:  His family history includes Arthritis in his sister; Breast cancer in his maternal grandmother and sister; Diabetes in his father; Heart attack in his father; Melanoma in his mother; Prostate cancer in his father. There is no history of Anesthesia problems, Hypotension, Malignant hyperthermia, Pseudochol deficiency, Pancreatic cancer, or Colon cancer.   Allergies No Known Allergies   Home Medications  Prior to Admission medications   Medication Sig Start Date End Date Taking? Authorizing Provider  buPROPion (WELLBUTRIN XL) 150 MG 24 hr tablet Take 450 mg by mouth in the morning. 07/28/19  Yes [provider]  tamsulosin (FLOMAX) 0.4 MG CAPS capsule Take 0.4 mg by mouth daily.   Yes [provider]  acetaminophen (TYLENOL) 500 MG tablet Take 1,000 mg by mouth every 6 (six) hours as needed (for pain.).    [provider]  amLODipine (NORVASC) 10 MG tablet Take 10 mg by mouth in the morning. 01/06/16   [provider]  amoxicillin-clavulanate (AUGMENTIN) 875-125 MG tablet SMARTSIG:1 Tablet(s) By Mouth Every 12 Hours 03/22/21   [provider]  Ascorbic Acid (VITAMIN C) 1000 MG tablet Take 1,000 mg by mouth in the morning.    [provider]  aspirin 325 MG tablet Take 162.5 mg by mouth in the morning.    [provider]  atorvastatin (LIPITOR) 80 MG tablet Take 80 mg by  mouth in the morning. 08/11/20   [provider]  b complex vitamins tablet Take 1 tablet by mouth in the morning.    [provider]  Calcium Carbonate (CALCIUM 600 PO) Take 2 tablets by mouth in the morning.    [provider]  cholecalciferol (VITAMIN D) 1000 UNITS tablet Take 1,000 Units by mouth in the morning.    [provider]  clonazePAM (KLONOPIN) 1 MG tablet Take 1-2 mg by mouth See admin instructions. Take 1 tablet (1 mg) by mouth daily if needed for anxiety & take 2 tablets (2 mg) by mouth scheduled at bedtime. 08/18/19   [provider]  diclofenac Sodium (VOLTAREN) 1 % GEL Apply 1 application topically daily as needed (pain). 02/25/20   [provider]  diphenhydrAMINE (BENADRYL) 25 MG tablet Take 25 mg by mouth every 6 (six) hours as needed for allergies.    [provider]  fentaNYL (DURAGESIC) 75 MCG/HR Place 1 patch onto the skin every other day. 08/19/19   [provider]  furosemide (LASIX) 40 MG tablet Take 40-80 mg by mouth daily as needed (fluid retention.).    [provider]  gabapentin (NEURONTIN) 300 MG capsule Take 600 mg by mouth in the morning. 08/27/19   [provider]  ibuprofen (ADVIL,MOTRIN) 200 MG tablet Take 400 mg by mouth every 8 (eight) hours as needed (pain.).    [provider]  MAGNESIUM-ZINC PO Take 1 tablet by mouth in the morning.    [provider]  methocarbamol (ROBAXIN) 750 MG tablet Take 750 mg by mouth every 8 (eight) hours. 08/19/19   [provider]  mirabegron ER (MYRBETRIQ) 25 MG TB24 tablet Take 25 mg by mouth in the morning.    [provider]  Multiple Vitamin (MULTIVITAMIN WITH MINERALS) TABS tablet Take 1 tablet by mouth in the morning.    [provider]  nystatin (MYCOSTATIN/NYSTOP) powder Apply 1 application topically 2 (two) times daily as needed (skin irritation/rash.).  08/19/19   [provider]   Omega-3 Fatty Acids (FISH OIL PO) Take 2,000 mg by mouth in the morning.    [provider]  Phenazopyridine HCl 99.5 MG TABS Take 2 tablets by mouth daily as needed (urinary pain).    [provider]  polyethylene glycol powder (GLYCOLAX/MIRALAX) 17 GM/SCOOP powder Take 17 g by mouth daily as needed for mild constipation.    [provider]  Potassium 99 MG TABS Take 99 mg by mouth every evening. 10/14/08   [provider]  pregabalin (LYRICA) 150 MG capsule Take 300 mg by mouth at bedtime. 08/19/19   [provider]  pseudoephedrine (SUDAFED) 30 MG tablet Take 60 mg by mouth every 8 (eight) hours as needed for congestion.    [provider]  QUEtiapine (SEROQUEL) 100 MG tablet Take 1 tablet (100 mg total) by mouth at bedtime. 06/05/16   Domenic Polite, MD  quinapril (ACCUPRIL) 40 MG tablet Take 80 mg by mouth in the morning.    [provider]  RESTASIS 0.05 % ophthalmic emulsion Place 1 drop into both eyes 2 (two) times daily as needed (dry/irritated eyes.). 02/11/16   [provider]  sennosides-docusate sodium (SENOKOT-S) 8.6-50 MG tablet Take 1 tablet by mouth at bedtime as needed for constipation.    [provider]  simethicone (MYLICON) 017 MG chewable tablet Chew 125 mg by mouth every 6 (six) hours as needed for flatulence.    [provider]  solifenacin (VESICARE) 10 MG tablet Take 10 mg by mouth daily. 02/09/21   [provider]  traZODone (DESYREL) 100 MG tablet Take 1 tablet (100 mg total) by mouth at bedtime. 06/05/16   Domenic Polite, MD  Venlafaxine HCl 75 MG TB24 Take 225 mg by mouth in the morning. 08/19/19   [provider]  vitamin E 180 MG (400 UNITS) capsule Take 400 Units by mouth daily.    [provider]     Critical care time: 50 minutes    JD Geryl [facility] 05/06/2021, 10:38 PM  Please see [email or web address] for pager  details.  From 7A-7P if no response, please call [phone]. After hours, please call ELink [phone].

## 2021-05-06 NOTE — Progress Notes (Signed)
Physical Therapy Treatment Patient Details Name: Michael Lutz MRN: [id number] DOB: November 08, 1945 Today's Date: 05/06/2021   History of Present Illness KERVIN BONES is a 76 y.o. male admitted 04/13/2021 with AMS. dx with acute encephalopathy. PMH includes HTN, HLD, PVD, AAA s/p EVAR, prostate cancer, bipolar disorder with anxiety on chronic Klonopin, chronic back pain using fentanyl patch, memory issues.    PT Comments    Patient very restlessness and not redirectable this session. Patient not following commands or responding to name. Patient required totalA+2 for rolling L/R in bed and repositioning. Due to cognition and restlessness, it was deemed unsafe to progress to EOB this date. Continue to recommend SNF for ongoing Physical Therapy. Will continue to follow acutely.        Recommendations for follow up therapy are one component of a multi-disciplinary discharge planning process, led by the attending physician.  Recommendations may be updated based on patient status, additional functional criteria and insurance authorization.  Follow Up Recommendations  Skilled nursing-short term rehab (<3 hours/day)     Assistance Recommended at Discharge Frequent or constant Supervision/Assistance  Patient can return home with the following A lot of help with walking and/or transfers;A lot of help with bathing/dressing/bathroom;Two people to help with bathing/dressing/bathroom;Assistance with Warden/ranger (2 wheels);BSC/3in1    Recommendations for Other Services       Precautions / Restrictions Precautions Precautions: Fall Precaution Comments: AMS, restraints Restrictions Weight Bearing Restrictions: No     Mobility  Bed Mobility Overal bed mobility: Needs Assistance Bed Mobility: Rolling Rolling: Total assist, +2 for physical assistance, +2 for safety/equipment         General bed mobility comments: totalA+2 to roll L/R and  repositioning in bed. Unsafe to progress to EOB this date due to restlessness and not following commands    Transfers                   General transfer comment: unsafe to progress to EOB this date due to restlessness and not following commands    Ambulation/Gait                   Stairs             Wheelchair Mobility    Modified Rankin (Stroke Patients Only)       Balance                                            Cognition Arousal/Alertness: Lethargic Behavior During Therapy: Restless, Flat affect Overall Cognitive Status: Impaired/Different from baseline                     Current Attention Level: Focused   Following Commands: Follows one step commands inconsistently Safety/Judgement: Decreased awareness of safety, Decreased awareness of deficits Awareness: Intellectual   General Comments: Not following commands or responding to name this session. Very restless and reaching for things when restraints undone. Physical assist to redirect hands and legs        Exercises      General Comments General comments (skin integrity, edema, etc.): Patient with increased WOB in supine and with HOB elevated although spO2>90%. BP elevated 180s/100s      Pertinent Vitals/Pain Pain Assessment Pain Assessment: Faces Faces Pain Scale: Hurts little more Pain Location: generalized Pain Descriptors / Indicators: Grimacing, Restless  Pain Intervention(s): Monitored during session    Home Living                          Prior Function            PT Goals (current goals can now be found in the care plan section) Acute Rehab PT Goals Patient Stated Goal: Did not state PT Goal Formulation: Patient unable to participate in goal setting Time For Goal Achievement: 05/17/21 Potential to Achieve Goals: Fair Progress towards PT goals: Not progressing toward goals - comment (due to cognition)    Frequency    Min  2X/week      PT Plan Current plan remains appropriate    Co-evaluation PT/OT/SLP Co-Evaluation/Treatment: Yes Reason for Co-Treatment: Necessary to address cognition/behavior during functional activity;For patient/therapist safety PT goals addressed during session: Mobility/safety with mobility        AM-PAC PT "6 Clicks" Mobility   Outcome Measure  Help needed turning from your back to your side while in a flat bed without using bedrails?: Total Help needed moving from lying on your back to sitting on the side of a flat bed without using bedrails?: Total Help needed moving to and from a bed to a chair (including a wheelchair)?: Total Help needed standing up from a chair using your arms (e.g., wheelchair or bedside chair)?: Total Help needed to walk in hospital room?: Total Help needed climbing 3-5 steps with a railing? : Total 6 Click Score: 6    End of Session   Activity Tolerance: Other (comment) (limited due to AMS and restlessness) Patient left: in bed;with call bell/phone within reach;with bed alarm set;with restraints reapplied Nurse Communication: Mobility status PT Visit Diagnosis: Unsteadiness on feet (R26.81);Other abnormalities of gait and mobility (R26.89);Other symptoms and signs involving the nervous system (R29.898)     Time: 1000-1023 PT Time Calculation (min) (ACUTE ONLY): 23 min  Charges:  $Therapeutic Activity: 8-22 mins                     Rukiya Hodgkins A. Gilford Rile PT, DPT Acute Rehabilitation Services Pager [phone] Office [phone]    Linna Hoff 05/06/2021, 10:33 AM

## 2021-05-06 NOTE — Progress Notes (Signed)
Occupational Therapy Treatment Patient Details Name: Michael Lutz MRN: [id number] DOB: [birth year] Today's Date: 05/06/2021   History of present illness KEJUAN BEKKER is a 76 y.o. male admitted 05/10/2021 with AMS. dx with acute encephalopathy. PMH includes HTN, HLD, PVD, AAA s/p EVAR, prostate cancer, bipolar disorder with anxiety on chronic Klonopin, chronic back pain using fentanyl patch, memory issues.   OT comments  Pt is not following commands at all today, very restless, total A +2 for bed mobility, face washing required Hand over hand with less participation from Pt today from previous session. Pt is total A for all aspects of ADL at this time due to increase in AMS/cognition. POC remains appropriate. Not safe to attempt OOB at this time.    Recommendations for follow up therapy are one component of a multi-disciplinary discharge planning process, led by the attending physician.  Recommendations may be updated based on patient status, additional functional criteria and insurance authorization.    Follow Up Recommendations  Skilled nursing-short term rehab (<3 hours/day)    Assistance Recommended at Discharge Frequent or constant Supervision/Assistance  Patient can return home with the following  A lot of help with walking and/or transfers;A lot of help with bathing/dressing/bathroom;Assistance with cooking/housework;Direct supervision/assist for medications management;Assistance with feeding;Direct supervision/assist for financial management;Assist for transportation;Help with stairs or ramp for entrance   Equipment Recommendations  BSC/3in1    Recommendations for Other Services PT consult    Precautions / Restrictions Precautions Precautions: Fall Precaution Comments: AMS, restraints Restrictions Weight Bearing Restrictions: No       Mobility Bed Mobility Overal bed mobility: Needs Assistance Bed Mobility: Rolling Rolling: Total assist, +2 for physical assistance, +2 for  safety/equipment         General bed mobility comments: totalA+2 to roll L/R and repositioning in bed. Unsafe to progress to EOB this date due to restlessness and not following commands    Transfers                   General transfer comment: unsafe to progress to EOB this date due to restlessness and not following commands     Balance                                           ADL either performed or assessed with clinical judgement   ADL Overall ADL's : Needs assistance/impaired     Grooming: Total assistance;Bed level (HOB elevated) Grooming Details (indicate cue type and reason): hand over hand required, unable to sequence/attend at this time                     Toileting- Clothing Manipulation and Hygiene: Total assistance         General ADL Comments: total A for all aspects of ADL today    Extremity/Trunk Assessment Upper Extremity Assessment Upper Extremity Assessment: Difficult to assess due to impaired cognition            Vision       Perception     Praxis      Cognition Arousal/Alertness: Lethargic Behavior During Therapy: Restless, Flat affect Overall Cognitive Status: Impaired/Different from baseline                     Current Attention Level: Focused   Following Commands: Follows one step commands inconsistently Safety/Judgement: Decreased awareness of safety,  Decreased awareness of deficits Awareness: Intellectual   General Comments: Not following commands or responding to name this session. Very restless and reaching for things when restraints undone. Physical assist to redirect hands and legs        Exercises      Shoulder Instructions       General Comments Patient with increased WOB in supine and with HOB elevated although spO2>90%. BP elevated 180s/100s    Pertinent Vitals/ Pain       Pain Assessment Pain Assessment: Faces Faces Pain Scale: Hurts little more Pain Location:  generalized Pain Descriptors / Indicators: Grimacing, Restless Pain Intervention(s): Monitored during session, Repositioned  Home Living                                          Prior Functioning/Environment              Frequency  Min 2X/week        Progress Toward Goals  OT Goals(current goals can now be found in the care plan section)  Progress towards OT goals: Not progressing toward goals - comment (cognitive decline)  Acute Rehab OT Goals Patient Stated Goal: get to SNF OT Goal Formulation: With family Time For Goal Achievement: 05/17/21 Potential to Achieve Goals: [place] Discharge plan remains appropriate    Co-evaluation    PT/OT/SLP Co-Evaluation/Treatment: Yes Reason for Co-Treatment: Necessary to address cognition/behavior during functional activity;For patient/therapist safety;To address functional/ADL transfers PT goals addressed during session: Mobility/safety with mobility;Strengthening/ROM OT goals addressed during session: ADL's and self-care;Other (comment) (cognition)      AM-PAC OT "6 Clicks" Daily Activity     Outcome Measure   Help from another person eating meals?: Total Help from another person taking care of personal grooming?: Total Help from another person toileting, which includes using toliet, bedpan, or urinal?: Total Help from another person bathing (including washing, rinsing, drying)?: Total Help from another person to put on and taking off regular upper body clothing?: Total Help from another person to put on and taking off regular lower body clothing?: Total 6 Click Score: 6    End of Session    OT Visit Diagnosis: Unsteadiness on feet (R26.81);Other abnormalities of gait and mobility (R26.89);Muscle weakness (generalized) (M62.81);Other symptoms and signs involving cognitive function   Activity Tolerance Other (comment) (limited by cognition)   Patient Left in bed;with call bell/phone within  reach;with restraints reapplied;with bed alarm set   Nurse Communication Mobility status;Precautions        Time: 1000-1023 OT Time Calculation (min): 23 min  Charges: OT General Charges $OT Visit: 1 Visit OT Treatments $Self Care/Home Management : 8-22 mins  Jesse Sans OTR/L Acute Rehabilitation Services Pager: [phone] Office: [place] 05/06/2021, 11:33 AM

## 2021-05-06 NOTE — TOC Progression Note (Signed)
Transition of Care [facility]) - Progression Note    Patient Details  Name: Michael Lutz MRN: [id number] Date of Birth: [birth year]  Transition of Care [facility]) CM/SW Contact  [name], [place] Work Phone Number: 05/06/2021, 4:44 PM  Clinical Narrative:    CSW intern attempted to contact patients sister Nani Ravens [phone]) but received voicemail box. CSW intern left a message and will follow up with family to discuss discharge plans.  Expected Discharge Plan: [place] Barriers to Discharge: Continued Medical Work up, Requiring sitter/restraints  Expected Discharge Plan and Services Expected Discharge Plan: [place] arrangements for the past 2 months: Single Family Home                                       Social Determinants of Health (SDOH) Interventions    Readmission Risk Interventions No flowsheet data found.

## 2021-05-06 NOTE — Progress Notes (Signed)
°   05/06/21 1005  Assess: MEWS Score  Temp 98.9 F (37.2 C)  BP (!) 180/106  Pulse Rate (!) 57  SpO2 94 %  O2 Device Room Air  Assess: MEWS Score  MEWS Temp 0  MEWS Systolic 0  MEWS Pulse 0  MEWS RR 3  MEWS LOC 0  MEWS Score 3  MEWS Score Color Yellow  Assess: if the MEWS score is Yellow or Red  Were vital signs taken at a resting state? Yes  Focused Assessment No change from prior assessment  Early Detection of Sepsis Score *See Row Information* Low  MEWS guidelines implemented *See Row Information* Yes  Treat  MEWS Interventions Other (Comment) (repositioned pt)  Pain Scale Faces  Faces Pain Scale 6  Pain Type Chronic pain  Pain Location Back  Patients Stated Pain Goal 0  Document  Patient Outcome Transferred/level of care increased

## 2021-05-06 NOTE — Progress Notes (Signed)
HOSPITAL MEDICINE OVERNIGHT EVENT NOTE    Notified by Dr. Marianna Payment with internal medicine teaching service met patient unfortunately developed cardiopulmonary arrest at approximately 903 PM prompting a CODE BLUE.  It sounds that patient exhibited a bout of agitation just prior to the event and eventually developed sudden hypoxia and respiratory arrest.  Code team responded and resuscitative measures were initiated resulting in 12 minutes of CPR and administration of additional agents such as 2 rounds of intravenous epinephrine and 1 amp of bicarbonate.  Additionally an IO line was placed.  Patient underwent endotracheal intubation and initiation of mechanical ventilation during the code and patient was then transferred to 64M room 2.  Dr. Marianna Payment then spoke to [place] with the CCM service who graciously agreed to come evaluate the patient and assist with management.  Vernelle Emerald  MD [facility]

## 2021-05-06 NOTE — TOC Progression Note (Signed)
Transition of Care [facility]) - Initial/Assessment Note    Patient Details  Name: Michael Lutz MRN: [id number] Date of Birth: [birth year]  Transition of Care [facility]) CM/SW Contact:    Milinda Antis, [place] Phone Number: 05/06/2021, 10:04 AM  Clinical Narrative:                 Patient continues to have an altered mental status.  TOC will continue to follow       Patient Goals and CMS Choice        Expected Discharge Plan and Services                                                Prior Living Arrangements/Services                       Activities of Daily Living      Permission Sought/Granted                  Emotional Assessment              Admission diagnosis:  Encephalopathy [G93.40] Acute encephalopathy [G93.40] Patient Active Problem List   Diagnosis Date Noted   Morbidly obese ([place]) 05/03/2021   Chronic diastolic CHF (congestive heart failure) ([place]) 05/03/2021   S/P insertion of spinal cord stimulator 05/03/2021   Candidal intertrigo 05/03/2021   At risk for delirium 04/17/2021   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   Aortic valve calcification 03/11/2020   Hardening of the aorta (main artery of the heart) ([place]) 03/11/2020   Benign prostatic hyperplasia with lower urinary tract symptoms 03/11/2020   Bipolar affective disorder ([place]) 03/11/2020   Chronic pain syndrome 03/11/2020   Degeneration of lumbar intervertebral disc 03/11/2020   Elevated PSA 03/11/2020   Fusion of spine, lumbosacral region 03/11/2020   History of tobacco use 03/11/2020   Impairment of balance 03/11/2020   Memory loss 03/11/2020   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   Nicotine dependence, cigarettes, uncomplicated 12/45/8099   Osteopenia 03/11/2020   Other specified postprocedural states 03/11/2020   Personal history of colonic polyps 03/11/2020   Pure hypercholesterolemia 03/11/2020   Personal history of other diseases of the circulatory system  03/11/2020   Recurrent falls 03/11/2020   Slow transit constipation 03/11/2020   Malignant neoplasm of prostate ([place]) 06/24/2019   Pulmonary nodule 12/13/2016   Pulmonary emphysema ([place]) 07/20/2016   Mediastinal adenopathy 07/20/2016   Difficulty with speech    Acute encephalopathy 05/31/2016   Abdominal aortic aneurysm ([place]) 01/08/2014   Benign hypertension 01/02/2014   Abdominal aneurysm without mention of rupture 07/03/2013   LBP (low back pain) 05/22/2013   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

## 2021-05-06 NOTE — Progress Notes (Signed)
Date and time results received: 05/06/21 2325 (use smartphrase ".now" to insert current time)  Test: Lactic Acid Critical Value: 7.2  Name of Provider Notified: Elink  Awaiting new orders

## 2021-05-06 NOTE — Progress Notes (Addendum)
eLink Physician-Brief Progress Note Patient Name: Michael Lutz DOB: 07-26-1945 MRN: [id number]   Date of Service  05/06/2021  HPI/Events of Note  76/M with HTN, dyslipidemia, PVD, prostate cancer, anxiety on klonopin, chronic back pain on fentanyl patch, brought in the ED due to altered mental status.   Notified that patient went into PEA arrest.    BP 91/58, HR 83, RR 30s, O2 sats 98%  CXR Endotracheal tube tip about 4.5 cm superior to carina. Suspect esophageal tube placement, tip poorly visible, distal tube appears to course below diaphragm, but recommend abdominal radiograph to confirm position of esophageal tube 2. Chronic reticular opacities. Hazy ground-glass opacity in the right mid lung, possible acute superimposed pneumonia.  ABG 7.326/10.2/408.  eICU Interventions  S/p PEA arrest Acute respiratory failure Aspiration pneumonia Hypokalemia  Plan> Continue empiric antibiotics to cover for aspiration pneumonia.  Titrate FiO2.  Get KUB. Replete K.  Fentanyl gtt.  Lovenox for DVT prophylaxis.  Protonix for GI prophylaxis.      Intervention Category Evaluation Type: New Patient Evaluation  Elsie Lincoln 05/06/2021, 10:28 PM  11:16 PM KUB reviewed showing the tip of the OGT in the area of the gastric fundus.  Plan> Ok to use OGT.  11:29 PM CXR repeated with the KUB showing large left sided pneumothorax.  Lactate 7.2  Plan> Insert chest tube on the left. Continue to trend lactate.  1:40 AM CXR following chest tube placement shows no residual pneumothorax.   RN requesting to increase max dose of fentanyl gtt.   Plan> Fentanyl gtt increased to upto 381mcg/hr.   2:50 AM Notified of borderline blood pressure 90/60.   Plan> Give 500cc LR bolus.   6:54 AM K 3.3, crea 1.67. Mg 2.5, calcium 8.1. Lactate trending down from 7.2 --> 2.2.  Plan> Give Kcl.

## 2021-05-06 NOTE — Care Management Important Message (Signed)
Important Message  Patient Details  Name: Michael Lutz MRN: [id number] Date of Birth: 1945/10/22   Medicare Important Message Given:  Yes  Patient left prior to IM delivery  will mail IM to the patient home address    Orbie Pyo 05/06/2021, 3:06 PM

## 2021-05-06 NOTE — Code Documentation (Signed)
°  Patient Name: Michael Lutz   MRN: [id number]   Date of Birth/ Sex: 10-23-1945 , male      Admission Date: 05/08/2021  Attending Provider: Little Ishikawa, MD  Primary Diagnosis: Acute encephalopathy    Indication: Pt was in his usual state of health until this PM, when he was noted to be in PEA arrest. Code blue was subsequently called. At the time of arrival on scene, ACLS protocol was underway.   Technical Description:  - CPR performance duration:  12 minutes  - Was defibrillation or cardioversion used? No   - Was external pacer placed? No  - Was patient intubated pre/post CPR? Yes   Medications Administered: Y = Yes; Blank = No Amiodarone    Atropine    Calcium    Epinephrine  x  Lidocaine    Magnesium    Norepinephrine    Phenylephrine    Sodium bicarbonate  x  Vasopressin    Narcan was also given                                Post CPR evaluation:  - Final Status - Was patient successfully resuscitated ? Yes - What is current rhythm? Sinus tachycardia - What is current hemodynamic status? HDS  Miscellaneous Information:  - Testing: ABG, Magnesium, calcium, CMP, CXR  - Primary team notified?  Yes  - Family Notified? Yes        Orvis Brill, MD  05/06/2021, 10:29 PM

## 2021-05-06 NOTE — Progress Notes (Signed)
CIWA assessment done, pt. Disoriented x4, a change from baseline. Changes in vital signs also noted, Rapid response called and MD at bedside. Pt transferred to PCU.

## 2021-05-06 NOTE — Progress Notes (Signed)
Intubation Procedure Note  DAKSH COATES  [id number]  June 20, 1945  Date:05/06/21  Time:9:54 PM   Provider Performing:Reata Petrov, Raelyn Number    Procedure: Intubation (75883)  Indication(s) Respiratory Failure  Consent Unable to obtain consent due to emergent nature of procedure.   Anesthesia none   Time Out Verified patient identification, verified procedure, site/side was marked, verified correct patient position, special equipment/implants available, medications/allergies/relevant history reviewed, required imaging and test results available.   Sterile Technique Usual hand hygeine, None, and gloves were used   Procedure Description Patient positioned in bed supine.  Sedation given as noted above.  Patient was intubated with endotracheal tube using  4 mAC .  View was Grade 3 only epiglottis .  Number of attempts was 1.  Colorimetric CO2 detector was consistent with tracheal placement.   Complications/Tolerance None; patient tolerated the procedure well. Chest X-ray is ordered to verify placement. Positive ETCO2 and easy cap with BLB sounds   EBL Minimal   Specimen(s) None

## 2021-05-07 ENCOUNTER — Inpatient Hospital Stay (HOSPITAL_COMMUNITY): Payer: PPO

## 2021-05-07 DIAGNOSIS — Z9189 Other specified personal risk factors, not elsewhere classified: Secondary | ICD-10-CM | POA: Diagnosis not present

## 2021-05-07 DIAGNOSIS — J939 Pneumothorax, unspecified: Secondary | ICD-10-CM

## 2021-05-07 DIAGNOSIS — R0603 Acute respiratory distress: Secondary | ICD-10-CM | POA: Diagnosis not present

## 2021-05-07 DIAGNOSIS — Z66 Do not resuscitate: Secondary | ICD-10-CM

## 2021-05-07 DIAGNOSIS — R6521 Severe sepsis with septic shock: Secondary | ICD-10-CM | POA: Diagnosis not present

## 2021-05-07 DIAGNOSIS — E876 Hypokalemia: Secondary | ICD-10-CM

## 2021-05-07 DIAGNOSIS — N179 Acute kidney failure, unspecified: Secondary | ICD-10-CM | POA: Diagnosis not present

## 2021-05-07 DIAGNOSIS — G9341 Metabolic encephalopathy: Secondary | ICD-10-CM

## 2021-05-07 DIAGNOSIS — E870 Hyperosmolality and hypernatremia: Secondary | ICD-10-CM

## 2021-05-07 DIAGNOSIS — I469 Cardiac arrest, cause unspecified: Secondary | ICD-10-CM | POA: Diagnosis not present

## 2021-05-07 DIAGNOSIS — A419 Sepsis, unspecified organism: Secondary | ICD-10-CM | POA: Diagnosis not present

## 2021-05-07 DIAGNOSIS — Z515 Encounter for palliative care: Secondary | ICD-10-CM

## 2021-05-07 DIAGNOSIS — I10 Essential (primary) hypertension: Secondary | ICD-10-CM

## 2021-05-07 DIAGNOSIS — Z4682 Encounter for fitting and adjustment of non-vascular catheter: Secondary | ICD-10-CM

## 2021-05-07 DIAGNOSIS — G9340 Encephalopathy, unspecified: Secondary | ICD-10-CM | POA: Diagnosis not present

## 2021-05-07 LAB — COMPREHENSIVE METABOLIC PANEL
ALT: 69 U/L — ABNORMAL HIGH (ref 0–44)
ALT: 161 U/L — ABNORMAL HIGH (ref 0–44)
AST: 74 U/L — ABNORMAL HIGH (ref 15–41)
AST: 185 U/L — ABNORMAL HIGH (ref 15–41)
Albumin: 2.9 g/dL — ABNORMAL LOW (ref 3.5–5.0)
Albumin: 2.8 g/dL — ABNORMAL LOW (ref 3.5–5.0)
Alkaline Phosphatase: 54 U/L (ref 38–126)
Alkaline Phosphatase: 68 U/L (ref 38–126)
Anion gap: 13 (ref 5–15)
Anion gap: 14 (ref 5–15)
BUN: 34 mg/dL — ABNORMAL HIGH (ref 8–23)
BUN: 40 mg/dL — ABNORMAL HIGH (ref 8–23)
CO2: 19 mmol/L — ABNORMAL LOW (ref 22–32)
CO2: 20 mmol/L — ABNORMAL LOW (ref 22–32)
Calcium: 8.1 mg/dL — ABNORMAL LOW (ref 8.9–10.3)
Calcium: 7.9 mg/dL — ABNORMAL LOW (ref 8.9–10.3)
Chloride: 116 mmol/L — ABNORMAL HIGH (ref 98–111)
Chloride: 113 mmol/L — ABNORMAL HIGH (ref 98–111)
Creatinine, Ser: 1.67 mg/dL — ABNORMAL HIGH (ref 0.61–1.24)
Creatinine, Ser: 2.37 mg/dL — ABNORMAL HIGH (ref 0.61–1.24)
GFR, Estimated: 42 mL/min — ABNORMAL LOW (ref >60)
GFR, Estimated: 28 mL/min — ABNORMAL LOW (ref >60)
Glucose, Bld: 156 mg/dL — ABNORMAL HIGH (ref 70–99)
Glucose, Bld: 184 mg/dL — ABNORMAL HIGH (ref 70–99)
Potassium: 3.3 mmol/L — ABNORMAL LOW (ref 3.5–5.1)
Potassium: 3.5 mmol/L (ref 3.5–5.1)
Sodium: 148 mmol/L — ABNORMAL HIGH (ref 135–145)
Sodium: 147 mmol/L — ABNORMAL HIGH (ref 135–145)
Total Bilirubin: 2 mg/dL — ABNORMAL HIGH (ref 0.3–1.2)
Total Bilirubin: 1.8 mg/dL — ABNORMAL HIGH (ref 0.3–1.2)
Total Protein: 6 g/dL — ABNORMAL LOW (ref 6.5–8.1)
Total Protein: 6.1 g/dL — ABNORMAL LOW (ref 6.5–8.1)

## 2021-05-07 LAB — CBC WITH DIFFERENTIAL/PLATELET
Abs Immature Granulocytes: 0.2 10*3/uL — ABNORMAL HIGH (ref 0.00–0.07)
Abs Immature Granulocytes: 0.21 10*3/uL — ABNORMAL HIGH (ref 0.00–0.07)
Basophils Absolute: 0 10*3/uL (ref 0.0–0.1)
Basophils Absolute: 0.1 10*3/uL (ref 0.0–0.1)
Basophils Relative: 0 %
Basophils Relative: 0 %
Eosinophils Absolute: 0 10*3/uL (ref 0.0–0.5)
Eosinophils Absolute: 0 10*3/uL (ref 0.0–0.5)
Eosinophils Relative: 0 %
Eosinophils Relative: 0 %
HCT: 51.8 % (ref 39.0–52.0)
HCT: 50.8 % (ref 39.0–52.0)
Hemoglobin: 16.4 g/dL (ref 13.0–17.0)
Hemoglobin: 16 g/dL (ref 13.0–17.0)
Immature Granulocytes: 1 %
Immature Granulocytes: 1 %
Lymphocytes Relative: 3 %
Lymphocytes Relative: 3 %
Lymphs Abs: 0.7 10*3/uL (ref 0.7–4.0)
Lymphs Abs: 0.7 10*3/uL (ref 0.7–4.0)
MCH: 28.1 pg (ref 26.0–34.0)
MCH: 28.1 pg (ref 26.0–34.0)
MCHC: 31.7 g/dL (ref 30.0–36.0)
MCHC: 31.5 g/dL (ref 30.0–36.0)
MCV: 88.9 fL (ref 80.0–100.0)
MCV: 89.1 fL (ref 80.0–100.0)
Monocytes Absolute: 2 10*3/uL — ABNORMAL HIGH (ref 0.1–1.0)
Monocytes Absolute: 1.7 10*3/uL — ABNORMAL HIGH (ref 0.1–1.0)
Monocytes Relative: 9 %
Monocytes Relative: 7 %
Neutro Abs: 19.8 10*3/uL — ABNORMAL HIGH (ref 1.7–7.7)
Neutro Abs: 20.3 10*3/uL — ABNORMAL HIGH (ref 1.7–7.7)
Neutrophils Relative %: 87 %
Neutrophils Relative %: 89 %
Platelets: 170 10*3/uL (ref 150–400)
Platelets: 144 10*3/uL — ABNORMAL LOW (ref 150–400)
RBC: 5.83 MIL/uL — ABNORMAL HIGH (ref 4.22–5.81)
RBC: 5.7 MIL/uL (ref 4.22–5.81)
RDW: 16.6 % — ABNORMAL HIGH (ref 11.5–15.5)
RDW: 17.2 % — ABNORMAL HIGH (ref 11.5–15.5)
WBC: 22.8 10*3/uL — ABNORMAL HIGH (ref 4.0–10.5)
WBC: 23 10*3/uL — ABNORMAL HIGH (ref 4.0–10.5)
nRBC: 0 % (ref 0.0–0.2)
nRBC: 0 % (ref 0.0–0.2)

## 2021-05-07 LAB — CBC
HCT: 51.9 % (ref 39.0–52.0)
Hemoglobin: 15.9 g/dL (ref 13.0–17.0)
MCH: 28.2 pg (ref 26.0–34.0)
MCHC: 30.6 g/dL (ref 30.0–36.0)
MCV: 92 fL (ref 80.0–100.0)
Platelets: 189 10*3/uL (ref 150–400)
RBC: 5.64 MIL/uL (ref 4.22–5.81)
RDW: 17 % — ABNORMAL HIGH (ref 11.5–15.5)
WBC: 29.2 10*3/uL — ABNORMAL HIGH (ref 4.0–10.5)
nRBC: 0 % (ref 0.0–0.2)

## 2021-05-07 LAB — POCT I-STAT 7, (LYTES, BLD GAS, ICA,H+H)
Acid-base deficit: 6 mmol/L — ABNORMAL HIGH (ref 0.0–2.0)
Bicarbonate: 20.1 mmol/L (ref 20.0–28.0)
Calcium, Ion: 1.19 mmol/L (ref 1.15–1.40)
HCT: 49 % (ref 39.0–52.0)
Hemoglobin: 16.7 g/dL (ref 13.0–17.0)
O2 Saturation: 95 %
Patient temperature: 36.6
Potassium: 3.6 mmol/L (ref 3.5–5.1)
Sodium: 149 mmol/L — ABNORMAL HIGH (ref 135–145)
TCO2: 21 mmol/L — ABNORMAL LOW (ref 22–32)
pCO2 arterial: 39.5 mmHg (ref 32–48)
pH, Arterial: 7.314 — ABNORMAL LOW (ref 7.35–7.45)
pO2, Arterial: 80 mmHg — ABNORMAL LOW (ref 83–108)

## 2021-05-07 LAB — LACTIC ACID, PLASMA: Lactic Acid, Venous: 2.2 mmol/L (ref 0.5–1.9)

## 2021-05-07 LAB — GLUCOSE, CAPILLARY
Glucose-Capillary: 140 mg/dL — ABNORMAL HIGH (ref 70–99)
Glucose-Capillary: 115 mg/dL — ABNORMAL HIGH (ref 70–99)
Glucose-Capillary: 120 mg/dL — ABNORMAL HIGH (ref 70–99)

## 2021-05-07 LAB — TROPONIN I (HIGH SENSITIVITY): Troponin I (High Sensitivity): 369 ng/L (ref <18)

## 2021-05-07 LAB — PROCALCITONIN
Procalcitonin: 0.25 ng/mL
Procalcitonin: <0.1 ng/mL

## 2021-05-07 LAB — RPR: RPR Ser Ql: NONREACTIVE

## 2021-05-07 LAB — MAGNESIUM: Magnesium: 2.5 mg/dL — ABNORMAL HIGH (ref 1.7–2.4)

## 2021-05-07 MED ORDER — MORPHINE BOLUS VIA INFUSION
5.0000 mg | INTRAVENOUS | Status: DC | PRN
  Administered 2021-05-07 (×2): 5 mg via INTRAVENOUS
  Filled 2021-05-07: qty 5

## 2021-05-07 MED ORDER — PANTOPRAZOLE 2 MG/ML SUSPENSION
40.0000 mg | Freq: Every day | ORAL | Status: DC
  Filled 2021-05-07: qty 20

## 2021-05-07 MED ORDER — MORPHINE 100MG IN NS 100ML (1MG/ML) PREMIX INFUSION
0.0000 mg/h | INTRAVENOUS | Status: DC
  Administered 2021-05-07: 5 mg/h via INTRAVENOUS
  Filled 2021-05-07: qty 100

## 2021-05-07 MED ORDER — ACETAMINOPHEN 160 MG/5ML PO SOLN: 650.0000 mg | ORAL | Status: DC | PRN

## 2021-05-07 MED ORDER — LORAZEPAM 2 MG/ML IJ SOLN
4.0000 mg | Freq: Once | INTRAMUSCULAR | Status: AC
  Administered 2021-05-07: 4 mg via INTRAVENOUS
  Filled 2021-05-07: qty 2

## 2021-05-07 MED ORDER — NOREPINEPHRINE 4 MG/250ML-% IV SOLN
2.0000 ug/min | INTRAVENOUS | Status: DC
  Administered 2021-05-07: 6 ug/min via INTRAVENOUS

## 2021-05-07 MED ORDER — SODIUM CHLORIDE 0.9 % IV SOLN: 250.0000 mL | INTRAVENOUS | Status: DC

## 2021-05-07 MED ORDER — LACTATED RINGERS IV BOLUS
500.0000 mL | Freq: Once | INTRAVENOUS | Status: AC
  Administered 2021-05-07: 500 mL via INTRAVENOUS

## 2021-05-07 MED ORDER — LORAZEPAM 2 MG/ML IJ SOLN
2.0000 mg | INTRAMUSCULAR | Status: DC | PRN
  Administered 2021-05-07: 4 mg via INTRAVENOUS
  Filled 2021-05-07: qty 2

## 2021-05-07 MED ORDER — POLYVINYL ALCOHOL 1.4 % OP SOLN
1.0000 [drp] | Freq: Four times a day (QID) | OPHTHALMIC | Status: DC | PRN
  Filled 2021-05-07: qty 15

## 2021-05-07 MED ORDER — SODIUM CHLORIDE 0.9% FLUSH
10.0000 mL | Freq: Three times a day (TID) | INTRAVENOUS | Status: DC
  Administered 2021-05-07 (×2): 10 mL

## 2021-05-07 MED ORDER — ACETAMINOPHEN 325 MG PO TABS: 650.0000 mg | ORAL_TABLET | Freq: Four times a day (QID) | ORAL | Status: DC | PRN

## 2021-05-07 MED ORDER — THIAMINE HCL 100 MG PO TABS
100.0000 mg | ORAL_TABLET | Freq: Every day | ORAL | Status: DC
  Administered 2021-05-07: 100 mg
  Filled 2021-05-07: qty 1

## 2021-05-07 MED ORDER — LORAZEPAM 2 MG/ML IJ SOLN: 4.0000 mg | INTRAMUSCULAR | Status: DC | PRN

## 2021-05-07 MED ORDER — HYDROMORPHONE HCL 2 MG/ML IJ SOLN: 4.0000 mg | INTRAMUSCULAR | Status: DC | PRN

## 2021-05-07 MED ORDER — HYDROMORPHONE HCL 1 MG/ML IJ SOLN: 0.5000 mg | INTRAMUSCULAR | Status: DC | PRN

## 2021-05-07 MED ORDER — GLYCOPYRROLATE 0.2 MG/ML IJ SOLN
0.2000 mg | INTRAMUSCULAR | Status: DC | PRN
  Administered 2021-05-07: 0.2 mg via INTRAVENOUS
  Filled 2021-05-07: qty 1

## 2021-05-07 MED ORDER — ATORVASTATIN CALCIUM 40 MG PO TABS
80.0000 mg | ORAL_TABLET | Freq: Every day | ORAL | Status: DC
  Administered 2021-05-07: 80 mg
  Filled 2021-05-07: qty 2

## 2021-05-07 MED ORDER — ACETAMINOPHEN 325 MG PO TABS: 650.0000 mg | ORAL_TABLET | ORAL | Status: DC | PRN

## 2021-05-07 MED ORDER — POTASSIUM CHLORIDE 10 MEQ/100ML IV SOLN
10.0000 meq | INTRAVENOUS | Status: AC
  Administered 2021-05-07 (×2): 10 meq via INTRAVENOUS
  Filled 2021-05-07 (×2): qty 100

## 2021-05-07 MED ORDER — ADULT MULTIVITAMIN W/MINERALS CH
1.0000 | ORAL_TABLET | Freq: Every day | ORAL | Status: DC
  Administered 2021-05-07: 1

## 2021-05-07 MED ORDER — BUSPIRONE HCL 15 MG PO TABS: 30.0000 mg | ORAL_TABLET | Freq: Three times a day (TID) | ORAL | Status: DC | PRN

## 2021-05-07 MED ORDER — DEXTROSE 5 % IV SOLN: INTRAVENOUS | Status: DC

## 2021-05-07 MED ORDER — VANCOMYCIN HCL 2000 MG/400ML IV SOLN
2000.0000 mg | Freq: Once | INTRAVENOUS | Status: AC
  Administered 2021-05-07: 2000 mg via INTRAVENOUS
  Filled 2021-05-07: qty 400

## 2021-05-07 MED ORDER — NOREPINEPHRINE 4 MG/250ML-% IV SOLN
INTRAVENOUS | Status: AC
  Administered 2021-05-07: 4 mg
  Filled 2021-05-07: qty 250

## 2021-05-07 MED ORDER — ACETAMINOPHEN 650 MG RE SUPP: 650.0000 mg | RECTAL | Status: DC | PRN

## 2021-05-07 MED ORDER — MORPHINE BOLUS VIA INFUSION
6.0000 mg | INTRAVENOUS | Status: DC | PRN
  Administered 2021-05-07 (×4): 6 mg via INTRAVENOUS
  Filled 2021-05-07: qty 6

## 2021-05-07 MED ORDER — GLYCOPYRROLATE 1 MG PO TABS: 1.0000 mg | ORAL_TABLET | ORAL | Status: DC | PRN

## 2021-05-07 MED ORDER — HYDROMORPHONE HCL 2 MG/ML IJ SOLN
4.0000 mg | INTRAMUSCULAR | Status: DC | PRN
  Administered 2021-05-07: 4 mg via INTRAVENOUS
  Filled 2021-05-07: qty 2

## 2021-05-07 MED ORDER — ACETAMINOPHEN 650 MG RE SUPP: 650.0000 mg | Freq: Four times a day (QID) | RECTAL | Status: DC | PRN

## 2021-05-07 MED ORDER — FOLIC ACID 1 MG PO TABS
1.0000 mg | ORAL_TABLET | Freq: Every day | ORAL | Status: DC
  Administered 2021-05-07: 1 mg
  Filled 2021-05-07: qty 1

## 2021-05-07 MED ORDER — BETHANECHOL CHLORIDE 10 MG PO TABS
10.0000 mg | ORAL_TABLET | Freq: Three times a day (TID) | ORAL | Status: DC
  Administered 2021-05-07: 10 mg
  Filled 2021-05-07: qty 1

## 2021-05-07 MED ORDER — ACETAMINOPHEN 160 MG/5ML PO SOLN: 650.0000 mg | ORAL | Status: DC

## 2021-05-07 MED ORDER — GLYCOPYRROLATE 0.2 MG/ML IJ SOLN: 0.2000 mg | INTRAMUSCULAR | Status: DC | PRN

## 2021-05-07 MED ORDER — VANCOMYCIN HCL 1500 MG/300ML IV SOLN: 1500.0000 mg | INTRAVENOUS | Status: DC

## 2021-05-07 MED ORDER — HEPARIN SODIUM (PORCINE) 5000 UNIT/ML IJ SOLN
5000.0000 [IU] | Freq: Three times a day (TID) | INTRAMUSCULAR | Status: DC
  Administered 2021-05-07: 5000 [IU] via SUBCUTANEOUS
  Filled 2021-05-07: qty 1

## 2021-05-07 MED ORDER — ASPIRIN 81 MG PO CHEW
162.0000 mg | CHEWABLE_TABLET | Freq: Every day | ORAL | Status: DC
  Administered 2021-05-07: 162 mg
  Filled 2021-05-07: qty 2

## 2021-05-07 MED ORDER — ACETAMINOPHEN 650 MG RE SUPP: 650.0000 mg | RECTAL | Status: DC

## 2021-05-07 MED ORDER — PROPOFOL 1000 MG/100ML IV EMUL
0.0000 ug/kg/min | INTRAVENOUS | Status: DC
  Administered 2021-05-07: 10 ug/kg/min via INTRAVENOUS
  Filled 2021-05-07 (×2): qty 100

## 2021-05-07 MED ORDER — THIAMINE HCL 100 MG/ML IJ SOLN: 100.0000 mg | Freq: Every day | INTRAMUSCULAR | Status: DC

## 2021-05-07 MED ORDER — DIPHENHYDRAMINE HCL 50 MG/ML IJ SOLN: 25.0000 mg | INTRAMUSCULAR | Status: DC | PRN

## 2021-05-07 MED ORDER — ACETAMINOPHEN 325 MG PO TABS: 650.0000 mg | ORAL_TABLET | ORAL | Status: DC

## 2021-05-07 MED ORDER — ONDANSETRON HCL 4 MG/2ML IJ SOLN: 4.0000 mg | Freq: Four times a day (QID) | INTRAMUSCULAR | Status: DC | PRN

## 2021-05-08 LAB — CULTURE, BLOOD (ROUTINE X 2)
Culture: NO GROWTH
Culture: NO GROWTH
Special Requests: ADEQUATE
Special Requests: ADEQUATE

## 2021-05-08 LAB — CALCIUM, IONIZED: Calcium, Ionized, Serum: 4 mg/dL — ABNORMAL LOW (ref 4.5–5.6)

## 2021-05-10 LAB — CULTURE, RESPIRATORY W GRAM STAIN: Gram Stain: NONE SEEN

## 2021-05-11 NOTE — Procedures (Signed)
Insertion of Chest Tube Procedure Note  Michael Lutz  [id number]  [birth year]  Date:05/14/2021  Time:12:20 AM    Provider Performing: Mick Sell   Procedure: Chest Tube Insertion [phone])  Indication(s) Pneumothorax  Consent Risks of the procedure as well as the alternatives and risks of each were explained to the patient and/or caregiver.  Consent for the procedure was obtained and is signed in the bedside chart  Anesthesia Topical only with 1% lidocaine    Time Out Verified patient identification, verified procedure, site/side was marked, verified correct patient position, special equipment/implants available, medications/allergies/relevant history reviewed, required imaging and test results available.   Sterile Technique Maximal sterile technique including full sterile barrier drape, hand hygiene, sterile gown, sterile gloves, mask, hair covering, sterile ultrasound probe cover (if used).   Procedure Description Ultrasound not used to identify appropriate pleural anatomy for placement and overlying skin marked. Area of placement cleaned and draped in sterile fashion.  A 14 French pigtail pleural catheter was placed into the left pleural space using Seldinger technique. Appropriate return of air was obtained.  The tube was connected to atrium and placed on -20 cm H2O wall suction.   Complications/Tolerance None; patient tolerated the procedure well. Chest X-ray is ordered to verify placement.   EBL Minimal  Specimen(s) none  JD Rexene Agent [facility] May 14, 2021, 12:21 AM  Please see [email or web address] for pager details.  From 7A-7P if no response, please call [phone]. After hours, please call ELink [phone].

## 2021-05-11 NOTE — Significant Event (Signed)
Met with sister and niece at bedside. Expressed concern for now 3 PEA arrest in last several hours. Labs and exam consistent with multiorgan failure. While early, he has signs of poor neurologic exam that preceded next 2 cardiac arrests. They express he has suffered enough in life. He would not want to continue life with poorer QOL than he was already experiencing, which they state was already poor. They do not want him to suffer. They state he would want to be DNR. Code status changed. They want him to be comfortable. Comfort measures were explained. They agreed that is what is desired. Awaiting brother to arrive from [place]. Anticipate transition to comfort care later this evening once he arrives.

## 2021-05-11 NOTE — Progress Notes (Signed)
Date and time results received: 05-26-2021 0146 (use smartphrase ".now" to insert current time)  Test: Troponin Critical Value: 369  Name of Provider Notified: Elink  Orders Received? Or Actions Taken?: Actions Taken:

## 2021-05-11 NOTE — Code Documentation (Signed)
°  Patient Name: LUKAH GOSWAMI   MRN: [id number]   Date of Birth/ Sex: 15-Mar-1945 , male      Admission Date: 04/24/2021  Attending Provider: Lanier Clam, MD  Primary Diagnosis: Acute encephalopathy    Indication: Pt was in his usual state of health until this AM, when he was noted to be on the way to CT scan and patient became pulseless. Pulse was regained and patient was safely transferred to ICU. Once in ICU, patient became pulseless again. Pulse was regained in the ICE. Code blue was subsequently called. At the time of arrival on scene, ACLS protocol was underway.   Technical Description:  - CPR performance duration:  15 minutes  - Was defibrillation or cardioversion used? No   - Was external pacer placed? Yes  - Was patient intubated pre/post CPR? Yes   Medications Administered: Y = Yes; Blank = No Amiodarone    Atropine    Calcium    Epinephrine  Yes  Lidocaine    Magnesium    Norepinephrine    Phenylephrine    Sodium bicarbonate  Yes  Vasopressin     Post CPR evaluation:  - Final Status - Was patient successfully resuscitated ? Yes - What is current rhythm? tachycardia - What is current hemodynamic status? Stable  Miscellaneous Information:  - Labs sent, including: CBC, CMP, ABG, Procalcitonin, Triglycerides   - Primary team notified?  Yes  - Family Notified? Yes  - Additional notes/ transfer status: None     Timothy Lasso, MD  13-May-2021, 11:24 AM

## 2021-05-11 NOTE — Plan of Care (Signed)

## 2021-05-11 NOTE — Progress Notes (Addendum)
Chaplain responded to Code Blue in X-ray/1st floor, then again to 20M when pt -recoded.  Sister and niece are bedside.  Other sibling, a middle brother, is enroute from [place].    Chaplain provided emotional support, escort from lobby and offered hospitality to family.  Please advise if further support is needed.    Minus [place], [place] Pager:  [phone]    Jun 05, 2021 1000  Clinical Encounter Type  Visited With Patient not available  Visit Type Initial;Code;Critical Care  Referral From Nurse  Consult/Referral To Chaplain  Stress Factors  Patient Stress Factors Health changes  Family Stress Factors Other (Comment) (Not present)

## 2021-05-11 NOTE — Plan of Care (Signed)
°  Problem: Safety: Goal: Non-violent Restraint(s) Outcome: Not Applicable   Problem: Education: Goal: Knowledge of General Education information will improve Description: Including pain rating scale, medication(s)/side effects and non-pharmacologic comfort measures Outcome: Not Progressing   Problem: Health Behavior/Discharge Planning: Goal: Ability to manage health-related needs will improve Outcome: Not Progressing   Problem: Clinical Measurements: Goal: Ability to maintain clinical measurements within normal limits will improve Outcome: Not Progressing Goal: Will remain free from infection Outcome: Not Progressing Goal: Diagnostic test results will improve Outcome: Not Progressing Goal: Respiratory complications will improve Outcome: Not Progressing

## 2021-05-11 NOTE — Progress Notes (Signed)
Pt. Transported to CT. Ct was completed and en route to unit patient's became unstable in hallway. AT 1024 patient went PEA and CPR began, CT team and rapid response assisted with code.  ROSC found at 1034. See code sheet for more details.  Pt. Transported back to 2MW 02. AT 4481 patient went PEA and CPR restarted. AT 1048 ROSC. See separate code sheet for more details.  Family decided to withdrawal care. TOD was 1411. [facility] called and patient transported down to morgue with no belongings.

## 2021-05-11 NOTE — Death Summary Note (Signed)
DEATH SUMMARY   Patient Details  Name: Michael Lutz MRN: [id number] DOB: 1945-11-05 CZY:[id number], Lattie Haw, MD  Admission/Discharge Information   Admit Date:  13-May-2021  Date of Death: Date of Death: 2021-05-18  Time of Death: Time of Death: 06-06-1409  Length of Stay: 4   Principle Cause of death: septic shock  Hospital Diagnoses: Principal Problem:   Acute encephalopathy Active Problems:   Benign hypertension   Bipolar affective disorder ([place])   Chronic pain syndrome   Pure hypercholesterolemia   At risk for delirium   Morbidly obese (HCC)   Chronic diastolic CHF (congestive heart failure) (HCC)   S/P insertion of spinal cord stimulator   Candidal intertrigo   Pneumothorax on left   Acute metabolic encephalopathy   Acute respiratory distress   Cardiac arrest, cause unspecified ([place])   Encounter for chest tube placement   Acute renal failure ([place])   Septic shock ([place])   Hypernatremia   Hypokalemia   DNR (do not resuscitate)   Comfort measures only status   Hospital Course:  76 y.o. man admitted with toxic encephalopathy due to likely polypharmacy. Patient unsure of what meds he was taking and what meds he was supposed to be taking. Had intermittent agitation treated with medications. He had PEA arrest PM 05/06/2021 from presumed hypoxemia. Likely aspiration event given large amount of bilious output from OG tube and family report of preceding severe GERD symptoms. He was intubated and transferred to ICU after ~12-13 minutes of CPR. No purposeful movement and not withdrawing to noxious stimuli morning 05-18-2022. He did have PTX following CPR and chest tube was placed with resolution of PTX but persistent air leak on suction. He had upward gaze deviation morning 18-May-2022. Did not improve with ativan. CT head and EEG ordered. CT was clear. Unfortunately, 2nd PEA arrest in CT scanner. ~10-12 minutes CPR with ROSC. 3rd PEA arrest shortly after arrival to ICU from scanner, ~3 minutes CPR. Labs  demonstrated worsening renal function. Neurologic exam poor. Family came to bedside. After discussion they elected to make him DNR and transition to comfort care.   Procedures: as per EMR  Consultations: as per EMR  The results of significant diagnostics from this hospitalization (including imaging, microbiology, ancillary and laboratory) are listed below for reference.   Significant Diagnostic Studies: DG Abd 1 View  Result Date: 05/06/2021 CLINICAL DATA:  Orogastric tube placement EXAM: ABDOMEN - 1 VIEW COMPARISON:  None. FINDINGS: Orogastric tube tip overlies the expected proximal body of the stomach. Large left pneumothorax is partially visualized. Diffuse pulmonary infiltrate persists. Dorsal column stimulator leads noted. Abdominal gas pattern is nonspecific due to a combination of relatively limited intra-abdominal gas and underpenetration. Spinal fusion hardware noted. IMPRESSION: Orogastric tube tip within the proximal body of the stomach. Large left pneumothorax partially visualized. Electronically Signed   By: Fidela Salisbury M.D.   On: 05/06/2021 23:15   CT HEAD WO CONTRAST (5MM)  Result Date: 05/18/2021 CLINICAL DATA:  Cardiac arrest.  Altered mental status EXAM: CT HEAD WITHOUT CONTRAST TECHNIQUE: Contiguous axial images were obtained from the base of the skull through the vertex without intravenous contrast. RADIATION DOSE REDUCTION: This exam was performed according to the departmental dose-optimization program which includes automated exposure control, adjustment of the mA and/or kV according to patient size and/or use of iterative reconstruction technique. COMPARISON:  Five days ago FINDINGS: Brain: No evidence of acute infarction, hemorrhage, hydrocephalus, extra-axial collection or mass lesion. Enlarged CSF spaces lateral to the left frontal lobe  and in the inter hemispheric fissure. 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 Generalized brain atrophy.  No detected anoxic injury. Vascular: No hyperdense vessel or unexpected calcification. Skull: Normal. Negative for fracture or focal lesion. Sinuses/Orbits: Bilateral cataract resection no acute finding. IMPRESSION: Stable head CT.  No acute finding. Electronically Signed   By: Jorje Guild M.D.   On: 05/10/21 10:23   CT Head Wo Contrast  Result Date: 04/15/2021 CLINICAL DATA:  Mental status change. EXAM: CT HEAD WITHOUT CONTRAST TECHNIQUE: Contiguous axial images were obtained from the base of the skull through the vertex without intravenous contrast. RADIATION DOSE REDUCTION: This exam was performed according to the departmental dose-optimization program which includes automated exposure control, adjustment of the mA and/or kV according to patient size and/or use of iterative reconstruction technique. COMPARISON:  May 31, 2016 FINDINGS: Brain: No evidence of acute infarction, hemorrhage, hydrocephalus, extra-axial collection or mass lesion/mass effect. There is chronic diffuse atrophy. Mild chronic bilateral periventricular white matter small vessel ischemic changes are identified. Vascular: No hyperdense vessel is noted. Skull: Normal. Negative for fracture or focal lesion. Sinuses/Orbits: No acute finding. Other: None. IMPRESSION: 1. No acute intracranial abnormality identified. 2. Chronic diffuse atrophy. 3. Mild chronic bilateral periventricular white matter small vessel ischemic changes. Electronically Signed   By: Abelardo Diesel M.D.   On: 04/15/2021 17:04   DG CHEST PORT 1 VIEW  Result Date: 05-10-21 CLINICAL DATA:  Follow-up exam, left-sided chest tube. Altered mental status. EXAM: PORTABLE CHEST 1 VIEW COMPARISON:  05-10-21 at 12:43 a.m., and older studies. FINDINGS: Left-sided chest tube appears replaced. Current chest 2 extends between the left upper ribs, pigtail curling over the mid to lower hemithorax. No convincing pneumothorax. Interstitial and hazy lung opacities are without  change from the earlier study. Endotracheal tube tip projects 4.7 cm above the carina. Nasal/orogastric tube passes below the diaphragm. Significant left-sided chest wall subcutaneous emphysema is likely increased from the earlier exam. IMPRESSION: 1. Chest tube on the left appears replaced/repositioned, as detailed above. 2. No convincing pneumothorax. 3. No change in the appearance of the lungs, which may reflect interstitial lung disease or a combination of interstitial lung disease and more acute inflammation/infection. Electronically Signed   By: Lajean Manes M.D.   On: 05/10/21 11:46   DG CHEST PORT 1 VIEW  Result Date: 05/10/2021 CLINICAL DATA:  Status post chest tube placement. EXAM: PORTABLE CHEST 1 VIEW COMPARISON:  May 06, 2021 FINDINGS: Since the prior study there is been interval left-sided chest tube placement. Its distal end is seen overlying the lateral aspect of the mid to upper left lung. No residual pneumothorax is identified. There is stable endotracheal tube and nasogastric tube positioning. Stable, diffuse, chronic appearing increased interstitial lung markings are noted. Mild areas of atelectasis are seen within the bilateral lung bases. There is no evidence of a pleural effusion. The cardiac silhouette is mildly enlarged and unchanged in size. Postoperative changes are noted throughout the thoracic spine. IMPRESSION: 1. Interval left-sided chest tube placement without evidence of a residual left-sided pneumothorax 2. Mild bibasilar atelectasis. 3. Stable diffuse, chronic appearing increased interstitial lung markings. Electronically Signed   By: Virgina Norfolk M.D.   On: 2021-05-10 01:00   DG Chest Port 1 View  Result Date: 05/06/2021 CLINICAL DATA:  Orogastric tube placement EXAM: PORTABLE CHEST 1 VIEW COMPARISON:  9:36 p.m. FINDINGS: Orogastric tube tip overlies the gastric fundus within the left upper quadrant of the abdomen. Endotracheal  tube is seen 3.8 cm above the  carina. Large left pneumothorax has developed. No significant mediastinal shift or hyperexpansion of the left hemithorax to suggest tension physiology at this time. Superimposed diffuse pulmonary infiltrate persists, infection versus edema. No pneumothorax on the right. No pleural effusion. Cardiomegaly persists. Tortuosity and ectasia of the thoracic aorta is again noted. IMPRESSION: Large left pneumothorax. Support tubes in appropriate position. Diffuse pulmonary infiltrate, stable since prior examination, infection versus edema. Electronically Signed   By: Fidela Salisbury M.D.   On: 05/06/2021 23:14   DG CHEST PORT 1 VIEW  Result Date: 05/06/2021 CLINICAL DATA:  Unresponsive EXAM: PORTABLE CHEST 1 VIEW COMPARISON:  05/06/2021, 04/28/2021, 10/05/2020 FINDINGS: Interval intubation, tip of the endotracheal tube is about 4.5 cm superior to the carina. Possible esophageal tube, tip poorly visible appears to course below diaphragm. Rotated patient. Left lung grossly clear. Patchy ground-glass disease in the right mid thorax possible pneumonia. Underlying chronic reticular opacity consistent with chronic lung disease. Distorted cardiomediastinal silhouette from rotation. Posterior spinal rods and ascending thoracic stimulator leads. IMPRESSION: 1. Endotracheal tube tip about 4.5 cm superior to carina. Suspect esophageal tube placement, tip poorly visible, distal tube appears to course below diaphragm, but recommend abdominal radiograph to confirm position of esophageal tube 2. Chronic reticular opacities. Hazy ground-glass opacity in the right mid lung, possible acute superimposed pneumonia. Electronically Signed   By: Donavan Foil M.D.   On: 05/06/2021 21:54   DG CHEST PORT 1 VIEW  Result Date: 05/06/2021 CLINICAL DATA:  Acute respiratory distress. EXAM: PORTABLE CHEST 1 VIEW COMPARISON:  Chest radiograph dated May 02, 2021 and CT examination dated February 09, 2020. FINDINGS: The heart is enlarged.  Bilateral lower lobe predominant reticular opacities concerning for chronic interstitial lung disease, unchanged. No definite acute consolidation. No pleural effusion or pneumothorax. Spinal hardware and spinal stimulator unchanged. IMPRESSION: No active disease or significant interval change. Electronically Signed   By: Keane Police D.O.   On: 05/06/2021 10:25   DG Chest Port 1 View  Result Date: 05/01/2021 CLINICAL DATA:  Rales in bilateral lung bases. EXAM: PORTABLE CHEST 1 VIEW COMPARISON:  October 05, 2020 FINDINGS: The heart size and mediastinal contours are stable. Heart size is enlarged. Chronic increased interstitial markings are identified throughout the right lung and left lung base unchanged. No pleural effusion or focal pneumonia is noted. The visualized skeletal structures are stable. IMPRESSION: No acute cardiopulmonary disease identified. Chronic changes of bilateral lungs stable compared prior exam. Electronically Signed   By: Abelardo Diesel M.D.   On: 05/08/2021 16:11   DG Foot Complete Right  Result Date: 04/18/2021 Please see detailed radiograph report in office note.   Microbiology: Recent Results (from the past 240 hour(s))  Resp Panel by RT-PCR (Flu A&B, Covid) Nasopharyngeal Swab     Status: None   Collection Time: 04/21/2021  7:03 PM   Specimen: Nasopharyngeal Swab; Nasopharyngeal(NP) swabs in vial transport medium  Result Value Ref Range Status   SARS Coronavirus 2 by RT PCR NEGATIVE NEGATIVE Final    Comment: (NOTE) SARS-CoV-2 target nucleic acids are NOT DETECTED.  The SARS-CoV-2 RNA is generally detectable in upper respiratory specimens during the acute phase of infection. The lowest concentration of SARS-CoV-2 viral copies this assay can detect is 138 copies/mL. A negative result does not preclude SARS-Cov-2 infection and should not be used as the sole basis for treatment or other patient management decisions. A negative result may occur with  improper specimen  collection/handling, submission of  specimen other than nasopharyngeal swab, presence of viral mutation(s) within the areas targeted by this assay, and inadequate number of viral copies(<138 copies/mL). A negative result must be combined with clinical observations, patient history, and epidemiological information. The expected result is Negative.  Fact Sheet for Patients:  EntrepreneurPulse.com.au  Fact Sheet for Healthcare Providers:  IncredibleEmployment.be  This test is no t yet approved or cleared by the [place] FDA and  has been authorized for detection and/or diagnosis of SARS-CoV-2 by FDA under an Emergency Use Authorization (EUA). This EUA will remain  in effect (meaning this test can be used) for the duration of the COVID-19 declaration under Section 564(b)(1) of the Act, 21 U.S.C.section 360bbb-3(b)(1), unless the authorization is terminated  or revoked sooner.       Influenza A by PCR NEGATIVE NEGATIVE Final   Influenza B by PCR NEGATIVE NEGATIVE Final    Comment: (NOTE) The Xpert Xpress SARS-CoV-2/FLU/RSV plus assay is intended as an aid in the diagnosis of influenza from Nasopharyngeal swab specimens and should not be used as a sole basis for treatment. Nasal washings and aspirates are unacceptable for Xpert Xpress SARS-CoV-2/FLU/RSV testing.  Fact Sheet for Patients: EntrepreneurPulse.com.au  Fact Sheet for Healthcare Providers: IncredibleEmployment.be  This test is not yet approved or cleared by the [place] FDA and has been authorized for detection and/or diagnosis of SARS-CoV-2 by FDA under an Emergency Use Authorization (EUA). This EUA will remain in effect (meaning this test can be used) for the duration of the COVID-19 declaration under Section 564(b)(1) of the Act, 21 U.S.C. section 360bbb-3(b)(1), unless the authorization is terminated or revoked.  Performed at [facility], [place] [address] [place], [place] [address]   Culture, blood (routine x 2)     Status: None (Preliminary result)   Collection Time: 05/03/21  3:28 PM   Specimen: Right Antecubital; Blood  Result Value Ref Range Status   Specimen Description RIGHT ANTECUBITAL  Final   Special Requests   Final    BOTTLES DRAWN AEROBIC AND ANAEROBIC Blood Culture adequate volume   Culture   Final    NO GROWTH 4 DAYS Performed at [facility], [place] [address], [place], [place] [address]    Report Status PENDING  Incomplete  Culture, blood (routine x 2)     Status: None (Preliminary result)   Collection Time: 05/03/21  3:29 PM   Specimen: Left Antecubital; Blood  Result Value Ref Range Status   Specimen Description LEFT ANTECUBITAL  Final   Special Requests   Final    BOTTLES DRAWN AEROBIC AND ANAEROBIC Blood Culture adequate volume   Culture   Final    NO GROWTH 4 DAYS Performed at [facility], [place] [address], [place], [place] [address]    Report Status PENDING  Incomplete  MRSA Next Gen by PCR, Nasal     Status: None   Collection Time: 05/06/21  9:57 PM   Specimen: Nasal Mucosa; Nasal Swab  Result Value Ref Range Status   MRSA by PCR Next Gen NOT DETECTED NOT DETECTED Final    Comment: (NOTE) The GeneXpert MRSA Assay (FDA approved for NASAL specimens only), is one component of a comprehensive MRSA colonization surveillance program. It is not intended to diagnose MRSA infection nor to guide or monitor treatment for MRSA infections. Test performance is not FDA approved in patients less than 58 years old. Performed at [facility], [place] [address], [place],  [address]  Culture, Respiratory w Gram Stain     Status: None (Preliminary result)   Collection Time: 05/06/21 10:41 PM   Specimen: Tracheal Aspirate; Respiratory  Result Value Ref Range Status   Specimen Description TRACHEAL ASPIRATE  Final   Special Requests NONE  Final   Gram Stain   Final    NO  SQUAMOUS EPITHELIAL CELLS SEEN MODERATE WBC SEEN FEW GRAM POSITIVE RODS MODERATE GRAM POSITIVE COCCI Performed at [facility], [place] [address] [place], [place] [address]    Culture PENDING  Incomplete   Report Status PENDING  Incomplete    Time spent: 90 minutes  Signed: Lanier Clam, MD 06-06-21

## 2021-05-11 NOTE — Significant Event (Signed)
Met with sister and niece at bedside. After more discussion, they want to transition to comfort care now. They said brother who is out of town is in agreement. Unfortunately, he is many hours away in [place]. They do not want to  prolong anything further. Comfort measures ordered. Communicated with RN.

## 2021-05-11 NOTE — Consult Note (Signed)
[facility] Consult   Reason for Consult: Altered mental status with a history of bipolar disorder. Referring Physician:  Lanier Clam, MD  Patient Identification: Michael Lutz MRN:  [id number] Principal Diagnosis: Acute encephalopathy Diagnosis:  Principal Problem:   Acute encephalopathy Active Problems:   Benign hypertension   Bipolar affective disorder ([place])   Chronic pain syndrome   Pure hypercholesterolemia   At risk for delirium   Morbidly obese (HCC)   Chronic diastolic CHF (congestive heart failure) (HCC)   S/P insertion of spinal cord stimulator   Candidal intertrigo   Pneumothorax on left   Acute metabolic encephalopathy   Acute respiratory distress   Cardiac arrest, cause unspecified ([place])   Encounter for chest tube placement   HPI:   Michael Lutz is a 76 y.o. male patient with a history of hypertension, hyperlipidemia, PVD, AAA s/p ED AR, prostate cancer and chronic back pain on fentanyl patch and a psychiatric history of bipolar disorder with anxiety on, Klonopin and memory issues admitted with an mental status.  Per family, patient reportedly is fully alert and oriented at baseline.  He began to become confused and started speaking inappropriately on 2/19.  Family suspects he had not been taking medications appropriately, noting they had found pills on the floor.   Past Psychiatric History: Bipolar disorder and anxiety  Attempted assessment of patient.  Patient had been taken off the unit to the CT scanner.  Nurses report that patient is on the ventilator and not responsive.  Patient coded at [facility].  Unable to assess  Past Medical History:  Past Medical History:  Diagnosis Date   AAA (abdominal aortic aneurysm) ([place])    ADD (attention deficit disorder)    Anxiety    takes Clonazepam daily   Arthritis    left knee   Bipolar 1 disorder (HCC)    takes Seroquel nightly   Bipolar affective disorder ([place])    in remission on meds   Blood  transfusion    several times   CHF (congestive heart failure) (HCC)    Chronic back pain    [facility]   Chronic back pain    scoliosis   Chronic low back pain 07/31/2013   Colon polyps    Dr Cristina Gong does a colonoscopy every 5 years   Constipation    takes Colace prn   Depression    takes Effexor daily   Dysrhythmia    palpitations- sees dr Irish Lack   Enlarged prostate    slightly   Full dentures    GERD (gastroesophageal reflux disease)    takes Zantac daily   Heart murmur    arrythmia   History of shingles    Hx of scabies Mid Feb 2013   Hx of seasonal allergies    Hyperlipidemia    takes Niacin daily and Lipitor daily   Hypertension    takes Amlodipine and Quinapril daily   Insomnia    takes Trazodone nightly   Joint pain    Leg pain 05-04-10   with walking   Memory loss    short and long term   Nocturia    Peripheral edema    takes Furosemide prn   Peripheral vascular disease (HCC)    s/p AAA stent   Prostate cancer ([place])    Urinary urgency    Wears glasses     Past Surgical History:  Procedure Laterality Date   ABDOMINAL AORTIC ANEURYSM REPAIR  11/2009  Stent graft repair   BACK SURGERY     thoracic spine   CARDIAC CATHETERIZATION     15 yrs ago   CHOLECYSTECTOMY     COLONOSCOPY     FOOT SURGERY     Multiple surgeries on right foot   GANGLION CYST EXCISION Right 06/07/2012   Procedure: DISTAL POLE EXCISION RIGHT WRIST;  Surgeon: Wynonia Sours, MD;  Location: [facility];  Service: Orthopedics;  Laterality: Right;   GOLD SEED IMPLANT N/A 09/08/2019   Procedure: GOLD SEED IMPLANT;  Surgeon: Franchot Gallo, MD;  Location: [facility];  Service: Urology;  Laterality: N/A;  30 MINS   LOWER EXTREMITY ANGIOGRAPHY Right 09/15/2020   Procedure: Lower Extremity Angiography;  Surgeon: Cherre Robins, MD;  Location: [facility];  Service: Cardiovascular;  Laterality: Right;   SHOULDER SURGERY     Right shoulder   SPACE OAR  INSTILLATION N/A 09/08/2019   Procedure: SPACE OAR INSTILLATION;  Surgeon: Franchot Gallo, MD;  Location[facility];  Service: Urology;  Laterality: N/A;   SPINAL CORD STIMULATOR IMPLANT  2009   NOVI [id number]  MRI UNSAFE   SPINAL FUSION  04/04/2011   Procedure: FUSION POSTERIOR SPINAL MULTILEVEL/SCOLIOSIS;  Surgeon: Gunnar Bulla, MD;  Location: [facility];  Service: Orthopedics;  Laterality: N/A;  repair thoracic and lumbar  pseudoarthrosis, revise spinal cord stimulator, extend thoracic fusion, iliac crest bone graft   SPINE SURGERY  12/2009   Spinal Fusion by Dr. Marcial Pacas   TESTICLE SURGERY     right testicle   TONSILLECTOMY     TURP VAPORIZATION     VASCULAR SURGERY     AAA stent repair   Family History:  Family History  Problem Relation Age of Onset   Melanoma Mother    Diabetes Father    Heart attack Father    Prostate cancer Father    Arthritis Sister        rheumatoid   Breast cancer Sister    Breast cancer Maternal Grandmother    Anesthesia problems Neg Hx    Hypotension Neg Hx    Malignant hyperthermia Neg Hx    Pseudochol deficiency Neg Hx    Pancreatic cancer Neg Hx    Colon cancer Neg Hx    Social History:  Social History   Substance and Sexual Activity  Alcohol Use No   Alcohol/week: 0.0 standard drinks     Social History   Substance and Sexual Activity  Drug Use No    Social History   Socioeconomic History   Marital status: Single    Spouse name: Not on file   Number of children: Not on file   Years of education: Not on file   Highest education level: Not on file  Occupational History   Occupation: Retired  Tobacco Use   Smoking status: Former    Packs/day: 1.00    Years: 50.00    Pack years: 50.00    Types: Cigarettes    Quit date: 10/08/2013    Years since quitting: 7.5   Smokeless tobacco: Never   Tobacco comments:    states smokes "little cigars" - former  Vaping Use   Vaping Use: Never used  Substance and Sexual Activity   Alcohol use:  No    Alcohol/week: 0.0 standard drinks   Drug use: No   Sexual activity: Not Currently  Other Topics Concern   Not on file  Social History Narrative   Not on file   Social  Determinants of Health   Financial Resource Strain: Not on file  Food Insecurity: Not on file  Transportation Needs: Not on file  Physical Activity: Not on file  Stress: Not on file  Social Connections: Not on file      Allergies:  No Known Allergies  Labs:  Results for orders placed or performed during the hospital encounter of 04/28/2021 (from the past 48 hour(s))  Glucose, capillary     Status: Abnormal   Collection Time: 05/05/21 11:29 AM  Result Value Ref Range   Glucose-Capillary 133 (H) 70 - 99 mg/dL    Comment: Glucose reference range applies only to samples taken after fasting for at least 8 hours.  Glucose, capillary     Status: Abnormal   Collection Time: 05/05/21  9:06 PM  Result Value Ref Range   Glucose-Capillary 111 (H) 70 - 99 mg/dL    Comment: Glucose reference range applies only to samples taken after fasting for at least 8 hours.  Comprehensive metabolic panel     Status: Abnormal   Collection Time: 05/06/21  4:50 AM  Result Value Ref Range   Sodium 146 (H) 135 - 145 mmol/L   Potassium 2.8 (L) 3.5 - 5.1 mmol/L   Chloride 115 (H) 98 - 111 mmol/L   CO2 20 (L) 22 - 32 mmol/L   Glucose, Bld 121 (H) 70 - 99 mg/dL    Comment: Glucose reference range applies only to samples taken after fasting for at least 8 hours.   BUN 20 8 - 23 mg/dL   Creatinine, Ser 1.04 0.61 - 1.24 mg/dL   Calcium 8.6 (L) 8.9 - 10.3 mg/dL   Total Protein 6.7 6.5 - 8.1 g/dL   Albumin 3.3 (L) 3.5 - 5.0 g/dL   AST 40 15 - 41 U/L   ALT 35 0 - 44 U/L   Alkaline Phosphatase 55 38 - 126 U/L   Total Bilirubin 3.1 (H) 0.3 - 1.2 mg/dL   GFR, Estimated >60 >60 mL/min    Comment: (NOTE) Calculated using the CKD-EPI Creatinine Equation (2021)    Anion gap 11 5 - 15    Comment: Performed at [facility], [place] [address] [place], [place] [address]  CBC with Differential/Platelet     Status: Abnormal   Collection Time: 05/06/21  4:50 AM  Result Value Ref Range   WBC 12.9 (H) 4.0 - 10.5 K/uL   RBC 5.80 4.22 - 5.81 MIL/uL   Hemoglobin 16.3 13.0 - 17.0 g/dL   HCT 51.0 39.0 - 52.0 %   MCV 87.9 80.0 - 100.0 fL   MCH 28.1 26.0 - 34.0 pg   MCHC 32.0 30.0 - 36.0 g/dL   RDW 16.4 (H) 11.5 - 15.5 %   Platelets 144 (L) 150 - 400 K/uL   nRBC 0.0 0.0 - 0.2 %   Neutrophils Relative % 83 %   Neutro Abs 10.8 (H) 1.7 - 7.7 K/uL   Lymphocytes Relative 8 %   Lymphs Abs 1.0 0.7 - 4.0 K/uL   Monocytes Relative 7 %   Monocytes Absolute 0.9 0.1 - 1.0 K/uL   Eosinophils Relative 1 %   Eosinophils Absolute 0.1 0.0 - 0.5 K/uL   Basophils Relative 0 %   Basophils Absolute 0.1 0.0 - 0.1 K/uL   Immature Granulocytes 1 %   Abs Immature Granulocytes 0.06 0.00 - 0.07 K/uL    Comment: Performed at [facility], [address] [place], [place] [address]  Glucose,  capillary     Status: Abnormal   Collection Time: 05/06/21  6:45 AM  Result Value Ref Range   Glucose-Capillary 126 (H) 70 - 99 mg/dL    Comment: Glucose reference range applies only to samples taken after fasting for at least 8 hours.  Glucose, capillary     Status: Abnormal   Collection Time: 05/06/21  8:41 AM  Result Value Ref Range   Glucose-Capillary 126 (H) 70 - 99 mg/dL    Comment: Glucose reference range applies only to samples taken after fasting for at least 8 hours.  Glucose, capillary     Status: Abnormal   Collection Time: 05/06/21 12:27 PM  Result Value Ref Range   Glucose-Capillary 139 (H) 70 - 99 mg/dL    Comment: Glucose reference range applies only to samples taken after fasting for at least 8 hours.  Magnesium     Status: None   Collection Time: 05/06/21  9:30 PM  Result Value Ref Range   Magnesium 2.1 1.7 - 2.4 mg/dL    Comment: Performed at [facility], [place] [address], [place], [place] [address]  Comprehensive metabolic  panel     Status: Abnormal   Collection Time: 05/06/21  9:30 PM  Result Value Ref Range   Sodium 147 (H) 135 - 145 mmol/L   Potassium 3.7 3.5 - 5.1 mmol/L    Comment: DELTA CHECK NOTED   Chloride 114 (H) 98 - 111 mmol/L   CO2 15 (L) 22 - 32 mmol/L   Glucose, Bld 216 (H) 70 - 99 mg/dL    Comment: Glucose reference range applies only to samples taken after fasting for at least 8 hours.   BUN 26 (H) 8 - 23 mg/dL   Creatinine, Ser 1.38 (H) 0.61 - 1.24 mg/dL   Calcium 8.1 (L) 8.9 - 10.3 mg/dL   Total Protein 6.1 (L) 6.5 - 8.1 g/dL   Albumin 2.9 (L) 3.5 - 5.0 g/dL   AST 56 (H) 15 - 41 U/L   ALT 48 (H) 0 - 44 U/L   Alkaline Phosphatase 59 38 - 126 U/L   Total Bilirubin 2.6 (H) 0.3 - 1.2 mg/dL   GFR, Estimated 53 (L) >60 mL/min    Comment: (NOTE) Calculated using the CKD-EPI Creatinine Equation (2021)    Anion gap 18 (H) 5 - 15    Comment: Performed at [facility], [place] [address] [place], [place] [address]  Troponin I (High Sensitivity)     Status: Abnormal   Collection Time: 05/06/21  9:30 PM  Result Value Ref Range   Troponin I (High Sensitivity) 75 (H) <18 ng/L    Comment: (NOTE) Elevated high sensitivity troponin I (hsTnI) values and significant  changes across serial measurements may suggest ACS but many other  chronic and acute conditions are known to elevate hsTnI results.  Refer to the Links section for chest pain algorithms and additional  guidance. Performed at [facility], [place] [address] [place], [place] [address]   Lactic acid, plasma     Status: Abnormal   Collection Time: 05/06/21  9:30 PM  Result Value Ref Range   Lactic Acid, Venous 7.2 (HH) 0.5 - 1.9 mmol/L    Comment: CRITICAL RESULT CALLED TO, READ BACK BY AND VERIFIED WITHLorelee Market, RN [phone] 05/06/21 A GASKINS Performed at [facility], [place] [address] [place], [place] [address]   Procalcitonin - Baseline     Status: None   Collection Time:  05/06/21  9:30 PM  Result Value Ref Range    Procalcitonin <0.10 ng/mL    Comment:        Interpretation: PCT (Procalcitonin) <= 0.5 ng/mL: Systemic infection (sepsis) is not likely. Local bacterial infection is possible. (NOTE)       Sepsis PCT Algorithm           Lower Respiratory Tract                                      Infection PCT Algorithm    ----------------------------     ----------------------------         PCT < 0.25 ng/mL                PCT < 0.10 ng/mL          Strongly encourage             Strongly discourage   discontinuation of antibiotics    initiation of antibiotics    ----------------------------     -----------------------------       PCT 0.25 - 0.50 ng/mL            PCT 0.10 - 0.25 ng/mL               OR       >80% decrease in PCT            Discourage initiation of                                            antibiotics      Encourage discontinuation           of antibiotics    ----------------------------     -----------------------------         PCT >= 0.50 ng/mL              PCT 0.26 - 0.50 ng/mL               AND        <80% decrease in PCT             Encourage initiation of                                             antibiotics       Encourage continuation           of antibiotics    ----------------------------     -----------------------------        PCT >= 0.50 ng/mL                  PCT > 0.50 ng/mL               AND         increase in PCT                  Strongly encourage                                      initiation of antibiotics    Strongly encourage escalation  of antibiotics                                     -----------------------------                                           PCT <= 0.25 ng/mL                                                 OR                                        > 80% decrease in PCT                                      Discontinue / Do not initiate                                             antibiotics  Performed at [facility], [place] [address] [place], [place] [address]   Glucose, capillary     Status: Abnormal   Collection Time: 05/06/21  9:55 PM  Result Value Ref Range   Glucose-Capillary 171 (H) 70 - 99 mg/dL    Comment: Glucose reference range applies only to samples taken after fasting for at least 8 hours.  MRSA Next Gen by PCR, Nasal     Status: None   Collection Time: 05/06/21  9:57 PM   Specimen: Nasal Mucosa; Nasal Swab  Result Value Ref Range   MRSA by PCR Next Gen NOT DETECTED NOT DETECTED    Comment: (NOTE) The GeneXpert MRSA Assay (FDA approved for NASAL specimens only), is one component of a comprehensive MRSA colonization surveillance program. It is not intended to diagnose MRSA infection nor to guide or monitor treatment for MRSA infections. Test performance is not FDA approved in patients less than 42 years old. Performed at [facility], [place] [address] [place], [place] [address]   I-STAT 7, (LYTES, BLD GAS, ICA, H+H)     Status: Abnormal   Collection Time: 05/06/21 10:32 PM  Result Value Ref Range   pH, Arterial 7.326 (L) 7.35 - 7.45   pCO2 arterial 40.2 32 - 48 mmHg   pO2, Arterial 408 (H) 83 - 108 mmHg   Bicarbonate 21.0 20.0 - 28.0 mmol/L   TCO2 22 22 - 32 mmol/L   O2 Saturation 100 %   Acid-base deficit 5.0 (H) 0.0 - 2.0 mmol/L   Sodium 149 (H) 135 - 145 mmol/L   Potassium 3.3 (L) 3.5 - 5.1 mmol/L   Calcium, Ion 1.18 1.15 - 1.40 mmol/L   HCT 46.0 39.0 - 52.0 %   Hemoglobin 15.6 13.0 - 17.0 g/dL   Patient temperature 98.6 F    Collection site RADIAL, ALLEN'S TEST ACCEPTABLE    Drawn by RT    Sample type ARTERIAL  Culture, Respiratory w Gram Stain     Status: None (Preliminary result)   Collection Time: 05/06/21 10:41 PM   Specimen: Tracheal Aspirate; Respiratory  Result Value Ref Range   Specimen Description TRACHEAL ASPIRATE    Special Requests NONE    Gram Stain      NO SQUAMOUS EPITHELIAL CELLS SEEN MODERATE WBC SEEN FEW GRAM POSITIVE RODS MODERATE  GRAM POSITIVE COCCI Performed at [facility], [place] [address] [place], [place] [address]    Culture PENDING    Report Status PENDING   Glucose, capillary     Status: Abnormal   Collection Time: 05/06/21 11:10 PM  Result Value Ref Range   Glucose-Capillary 163 (H) 70 - 99 mg/dL    Comment: Glucose reference range applies only to samples taken after fasting for at least 8 hours.  Lactic acid, plasma     Status: Abnormal   Collection Time: May 15, 2021 12:55 AM  Result Value Ref Range   Lactic Acid, Venous 2.2 (HH) 0.5 - 1.9 mmol/L    Comment: CRITICAL VALUE NOTED.  VALUE IS CONSISTENT WITH PREVIOUSLY REPORTED AND CALLED VALUE. Performed at [facility], [place] [address], [place], [place] [address]   CBC with Differential/Platelet     Status: Abnormal   Collection Time: 2021/05/15 12:55 AM  Result Value Ref Range   WBC 22.8 (H) 4.0 - 10.5 K/uL   RBC 5.83 (H) 4.22 - 5.81 MIL/uL   Hemoglobin 16.4 13.0 - 17.0 g/dL   HCT 51.8 39.0 - 52.0 %   MCV 88.9 80.0 - 100.0 fL   MCH 28.1 26.0 - 34.0 pg   MCHC 31.7 30.0 - 36.0 g/dL   RDW 16.6 (H) 11.5 - 15.5 %   Platelets 170 150 - 400 K/uL   nRBC 0.0 0.0 - 0.2 %   Neutrophils Relative % 87 %   Neutro Abs 19.8 (H) 1.7 - 7.7 K/uL   Lymphocytes Relative 3 %   Lymphs Abs 0.7 0.7 - 4.0 K/uL   Monocytes Relative 9 %   Monocytes Absolute 2.0 (H) 0.1 - 1.0 K/uL   Eosinophils Relative 0 %   Eosinophils Absolute 0.0 0.0 - 0.5 K/uL   Basophils Relative 0 %   Basophils Absolute 0.0 0.0 - 0.1 K/uL   Immature Granulocytes 1 %   Abs Immature Granulocytes 0.20 (H) 0.00 - 0.07 K/uL    Comment: Performed at [facility] [address] [place], [place] [address]  Troponin I (High Sensitivity)     Status: Abnormal   Collection Time: 05-15-21 12:55 AM  Result Value Ref Range   Troponin I (High Sensitivity) 369 (HH) <18 ng/L    Comment: CRITICAL RESULT CALLED TO, READ BACK BY AND VERIFIED WITH: Fabian November, RN [phone] 05-15-2021 A  GASKINS (NOTE) Elevated high sensitivity troponin I (hsTnI) values and significant  changes across serial measurements may suggest ACS but many other  chronic and acute conditions are known to elevate hsTnI results.  Refer to the Links section for chest pain algorithms and additional  guidance. Performed at [facility], [place] [address] [place], [place] [address]   Procalcitonin     Status: None   Collection Time: 2021/05/15 12:55 AM  Result Value Ref Range   Procalcitonin 0.25 ng/mL    Comment:        Interpretation: PCT (Procalcitonin) <= 0.5 ng/mL: Systemic infection (sepsis) is not likely. Local bacterial infection is possible. (NOTE)  Sepsis PCT Algorithm           Lower Respiratory Tract                                      Infection PCT Algorithm    ----------------------------     ----------------------------         PCT < 0.25 ng/mL                PCT < 0.10 ng/mL          Strongly encourage             Strongly discourage   discontinuation of antibiotics    initiation of antibiotics    ----------------------------     -----------------------------       PCT 0.25 - 0.50 ng/mL            PCT 0.10 - 0.25 ng/mL               OR       >80% decrease in PCT            Discourage initiation of                                            antibiotics      Encourage discontinuation           of antibiotics    ----------------------------     -----------------------------         PCT >= 0.50 ng/mL              PCT 0.26 - 0.50 ng/mL               AND        <80% decrease in PCT             Encourage initiation of                                             antibiotics       Encourage continuation           of antibiotics    ----------------------------     -----------------------------        PCT >= 0.50 ng/mL                  PCT > 0.50 ng/mL               AND         increase in PCT                  Strongly encourage                                      initiation  of antibiotics    Strongly encourage escalation           of antibiotics                                     -----------------------------  PCT <= 0.25 ng/mL                                                 OR                                        > 80% decrease in PCT                                      Discontinue / Do not initiate                                             antibiotics  Performed at [facility], [place] [address], [place], [place] [address]   Comprehensive metabolic panel     Status: Abnormal   Collection Time: 05-29-21  2:10 AM  Result Value Ref Range   Sodium 148 (H) 135 - 145 mmol/L   Potassium 3.3 (L) 3.5 - 5.1 mmol/L   Chloride 116 (H) 98 - 111 mmol/L   CO2 19 (L) 22 - 32 mmol/L   Glucose, Bld 156 (H) 70 - 99 mg/dL    Comment: Glucose reference range applies only to samples taken after fasting for at least 8 hours.   BUN 34 (H) 8 - 23 mg/dL   Creatinine, Ser 1.67 (H) 0.61 - 1.24 mg/dL   Calcium 8.1 (L) 8.9 - 10.3 mg/dL   Total Protein 6.0 (L) 6.5 - 8.1 g/dL   Albumin 2.9 (L) 3.5 - 5.0 g/dL   AST 74 (H) 15 - 41 U/L   ALT 69 (H) 0 - 44 U/L   Alkaline Phosphatase 54 38 - 126 U/L   Total Bilirubin 2.0 (H) 0.3 - 1.2 mg/dL   GFR, Estimated 42 (L) >60 mL/min    Comment: (NOTE) Calculated using the CKD-EPI Creatinine Equation (2021)    Anion gap 13 5 - 15    Comment: Performed at [facility] [address] [place], [place] [address]  CBC with Differential/Platelet     Status: Abnormal   Collection Time: May 29, 2021  2:10 AM  Result Value Ref Range   WBC 23.0 (H) 4.0 - 10.5 K/uL   RBC 5.70 4.22 - 5.81 MIL/uL   Hemoglobin 16.0 13.0 - 17.0 g/dL   HCT 50.8 39.0 - 52.0 %   MCV 89.1 80.0 - 100.0 fL   MCH 28.1 26.0 - 34.0 pg   MCHC 31.5 30.0 - 36.0 g/dL   RDW 17.2 (H) 11.5 - 15.5 %   Platelets 144 (L) 150 - 400 K/uL   nRBC 0.0 0.0 - 0.2 %   Neutrophils Relative % 89 %   Neutro Abs 20.3 (H) 1.7 - 7.7 K/uL    Lymphocytes Relative 3 %   Lymphs Abs 0.7 0.7 - 4.0 K/uL   Monocytes Relative 7 %   Monocytes Absolute 1.7 (H) 0.1 - 1.0 K/uL   Eosinophils Relative 0 %   Eosinophils Absolute 0.0 0.0 - 0.5 K/uL   Basophils Relative 0 %   Basophils Absolute 0.1 0.0 -  0.1 K/uL   Immature Granulocytes 1 %   Abs Immature Granulocytes 0.21 (H) 0.00 - 0.07 K/uL    Comment: Performed at [facility], [place] [address], [place], [place] [address]  Magnesium     Status: Abnormal   Collection Time: 05/12/2021  2:10 AM  Result Value Ref Range   Magnesium 2.5 (H) 1.7 - 2.4 mg/dL    Comment: Performed at [facility] [address], [place], [place] [address]  Glucose, capillary     Status: Abnormal   Collection Time: 2021-05-12  3:11 AM  Result Value Ref Range   Glucose-Capillary 140 (H) 70 - 99 mg/dL    Comment: Glucose reference range applies only to samples taken after fasting for at least 8 hours.  Glucose, capillary     Status: Abnormal   Collection Time: 05/12/21  7:18 AM  Result Value Ref Range   Glucose-Capillary 115 (H) 70 - 99 mg/dL    Comment: Glucose reference range applies only to samples taken after fasting for at least 8 hours.  I-STAT 7, (LYTES, BLD GAS, ICA, H+H)     Status: Abnormal   Collection Time: May 12, 2021  8:42 AM  Result Value Ref Range   pH, Arterial 7.314 (L) 7.35 - 7.45   pCO2 arterial 39.5 32 - 48 mmHg   pO2, Arterial 80 (L) 83 - 108 mmHg   Bicarbonate 20.1 20.0 - 28.0 mmol/L   TCO2 21 (L) 22 - 32 mmol/L   O2 Saturation 95 %   Acid-base deficit 6.0 (H) 0.0 - 2.0 mmol/L   Sodium 149 (H) 135 - 145 mmol/L   Potassium 3.6 3.5 - 5.1 mmol/L   Calcium, Ion 1.19 1.15 - 1.40 mmol/L   HCT 49.0 39.0 - 52.0 %   Hemoglobin 16.7 13.0 - 17.0 g/dL   Patient temperature 36.6 C    Collection site RADIAL, ALLEN'S TEST ACCEPTABLE    Drawn by RT    Sample type ARTERIAL     Current Facility-Administered Medications  Medication Dose Route Frequency Provider Last Rate Last Admin    [START ON 05/08/2021] acetaminophen (TYLENOL) 160 MG/5ML solution 650 mg  650 mg Per Tube Q4H PRN Mick Sell, PA-C       Or   [START ON 05/08/2021] acetaminophen (TYLENOL) suppository 650 mg  650 mg Rectal Q4H PRN Mick Sell, PA-C       aspirin chewable tablet 162 mg  162 mg Per Tube Daily Mick Sell, PA-C   162 mg at 05/12/21 5366   atorvastatin (LIPITOR) tablet 80 mg  80 mg Per Tube Daily Mick Sell, PA-C   80 mg at 05/12/2021 4403   bethanechol (URECHOLINE) tablet 10 mg  10 mg Per Tube TID Hunsucker, Bonna Gains, MD   10 mg at 2021-05-12 4742   busPIRone (BUSPAR) tablet 30 mg  30 mg Oral Q8H PRN Mick Sell, PA-C       Or   busPIRone (BUSPAR) tablet 30 mg  30 mg Per Tube Q8H PRN Andres Labrum D, PA-C       cefTRIAXone (ROCEPHIN) 2 g in sodium chloride 0.9 % 100 mL IVPB  2 g Intravenous Q24H Andres Labrum D, PA-C   Stopped at May 12, 2021 0046   chlorhexidine gluconate (MEDLINE KIT) (PERIDEX) 0.12 % solution 15 mL  15 mL Mouth Rinse BID Mick Sell, PA-C   15 mL at 05/12/2021 5956   Chlorhexidine Gluconate Cloth 2 % PADS 6 each  6  each Topical Q0600 Little Ishikawa, MD   6 each at 05/06/21 2200   clotrimazole (LOTRIMIN) 1 % cream   Topical BID Allie Bossier, MD   Given at 05/23/21 0908   dextrose 5 % and 0.45 % NaCl with KCl 10 mEq/L infusion   Intravenous Continuous Little Ishikawa, MD 75 mL/hr at May 23, 2021 0900 Infusion Verify at 2021-05-23 0900   docusate (COLACE) 50 MG/5ML liquid 100 mg  100 mg Per Tube BID Mick Sell, PA-C   100 mg at 05/23/2021 0174   fentaNYL (SUBLIMAZE) bolus via infusion 25-100 mcg  25-100 mcg Intravenous Q15 min PRN Mick Sell, PA-C   50 mcg at May 23, 2021 9449   fentaNYL 2559mg in NS 255m(1031mml) infusion-PREMIX  25-300 mcg/hr Intravenous Continuous YapElsie LincolnD 15 mL/hr at 02/13-Mar-202300 150 mcg/hr at 02/67/59/1603846folic acid (FOLVITE) tablet 1 mg  1 mg Per Tube Daily Hunsucker, MatBonna GainsD   1 mg at 02/03-13-2336599heparin injection 5,000 Units   5,000 Units Subcutaneous Q8H PayMick SellA-C   5,000 Units at 04/16/11/2318   hydrALAZINE (APRESOLINE) injection 10 mg  10 mg Intravenous Q6H PRN PatLenore CordiaD   10 mg at 02/13-Mar-202308   MEDLINE mouth rinse  15 mL Mouth Rinse 10 times per day PayMick SellA-C   15 mL at 04/16/11/202303570multivitamin with minerals tablet 1 tablet  1 tablet Per Tube Daily MilPriscella MannPH   1 tablet at 04/2021-05-1305   ondansetron (ZOFRAN) tablet 4 mg  4 mg Oral Q6H PRN PatLenore CordiaD       Or   ondansetron (ZOFRAN) injection 4 mg  4 mg Intravenous Q6H PRN PatLenore CordiaD       pantoprazole sodium (PROTONIX) 40 mg/20 mL oral suspension 40 mg  40 mg Per Tube QHS Millen, Jessica B, RPH       polyethylene glycol (MIRALAX / GLYCOLAX) packet 17 g  17 g Per Tube Daily PayMick SellA-C   17 g at 02/03-13-202301779potassium chloride 10 mEq in 100 mL IVPB  10 mEq Intravenous Q1 Hr x 2 YapElsie LincolnD 100 mL/hr at 04/16/11/2310 10 mEq at 04/16/11/202310   propofol (DIPRIVAN) 1000 MG/100ML infusion  0-50 mcg/kg/min Intravenous Continuous PayAndres Labrum PA-C 7 mL/hr at 04/16/11/202300 10 mcg/kg/min at 04/2021/05/1298   senna-docusate (Senokot-S) tablet 1 tablet  1 tablet Oral QHS PRN PatLenore CordiaD       sodium chloride flush (NS) 0.9 % injection 10 mL  10 mL Other Q8H PayAndres Labrum PA-C   10 mL at 04/16/11/2303903thiamine tablet 100 mg  100 mg Per Tube Daily Hunsucker, MatBonna GainsD   100 mg at 04/2021-03-1330092Or   thiamine (B-1) injection 100 mg  100 mg Intravenous Daily Hunsucker, MatBonna GainsD              Psychiatric Specialty Exam: Unable to assess  Presentation  General Appearance: No data recorded Eye Contact:No data recorded Speech:No data recorded Speech Volume:No data recorded Handedness:No data recorded  Mood and Affect  Mood:No data recorded Affect:No data recorded  Thought Process  Thought Processes:No data recorded Descriptions of Associations:No data  recorded Orientation:No data recorded Thought Content:No data recorded History of Schizophrenia/Schizoaffective disorder:No data recorded Duration of Psychotic Symptoms:No data recorded Hallucinations:No  data recorded Ideas of Reference:No data recorded Suicidal Thoughts:No data recorded Homicidal Thoughts:No data recorded  Sensorium  Memory:No data recorded Judgment:No data recorded Insight:No data recorded  Executive Functions  Concentration:No data recorded Attention Span:No data recorded Recall:No data recorded [employer] [place] recorded Language:No data recorded  Psychomotor Activity  Psychomotor Activity:No data recorded  Assets  Assets:No data recorded  Sleep  Sleep:No data recorded  Physical Exam: Unable to complete Physical Exam ROS Blood pressure 124/85, pulse 95, temperature (!) 97.3 F (36.3 C), resp. rate (!) 25, height 5' 7.99" (1.727 m), weight 120 kg, SpO2 95 %. Body mass index is 40.23 kg/m.  Treatment Plan Summary: Unable to assess patient. No medication recommendations at this time given severity of patient's medical needs.  Please re-consult psychiatry when patient is able to participate in interview/assessment.  Disposition: Patient does not meet criteria for psychiatric inpatient admission.  Lavella Hammock, MD 05/24/2021 10:01 AM

## 2021-05-11 NOTE — Progress Notes (Signed)
Pharmacy Antibiotic Note  Michael Lutz is a 76 y.o. male admitted on 04/19/2021 with sepsis.  Pharmacy has been consulted for Vancomycin dosing. Pt is s/p PEA arrest this past evening. WBC 13.8. Renal function ok.   Plan: Vancomycin 2000 mg IV x 1, then 1500 mg IV q24h >>>Estimated AUC: 511 Ceftriaxone per MD Trend WBC, temp, renal function  F/U infectious work-up Drug levels as indicated  Height: 5' 7.99" (172.7 cm) Weight: 116.7 kg (257 lb 4.4 oz) IBW/kg (Calculated) : 68.38  Temp (24hrs), Avg:98.6 F (37 C), Min:98.4 F (36.9 C), Max:98.9 F (37.2 C)  Recent Labs  Lab 04/25/2021 1615 05/09/2021 1625 05/03/21 0410 05/03/21 1620 05/03/21 2010 05/04/21 0354 05/05/21 0727 05/06/21 0450 05/06/21 2130  WBC 11.1*  --  13.8*  --   --  14.0* 12.5* 12.9*  --   CREATININE 1.13  --  1.12  --   --  0.96 1.04 1.04 1.38*  LATICACIDVEN  --  1.2  --  1.3 1.2  --   --   --  7.2*    Estimated Creatinine Clearance: 57.4 mL/min (A) (by C-G formula based on SCr of 1.38 mg/dL (H)).    No Known Allergies  Narda Bonds, PharmD, BCPS Clinical Pharmacist Phone: [phone]

## 2021-05-11 DEATH — deceased

## 2021-07-11 MED FILL — Medication: Qty: 1 | Status: AC
